# Patient Record
Sex: Female | Born: 1940 | Race: White | Hispanic: No | Marital: Married | Smoking: Former smoker
Health system: Southern US, Community
[De-identification: ages and names within clinical notes are randomized; demographics above are authoritative.]

## PROBLEM LIST (undated history)

## (undated) DIAGNOSIS — I4891 Unspecified atrial fibrillation: Secondary | ICD-10-CM

## (undated) DIAGNOSIS — M1990 Unspecified osteoarthritis, unspecified site: Secondary | ICD-10-CM

## (undated) DIAGNOSIS — K5790 Diverticulosis of intestine, part unspecified, without perforation or abscess without bleeding: Secondary | ICD-10-CM

## (undated) DIAGNOSIS — I2510 Atherosclerotic heart disease of native coronary artery without angina pectoris: Secondary | ICD-10-CM

## (undated) DIAGNOSIS — K635 Polyp of colon: Secondary | ICD-10-CM

## (undated) DIAGNOSIS — IMO0001 Reserved for inherently not codable concepts without codable children: Secondary | ICD-10-CM

## (undated) DIAGNOSIS — R55 Syncope and collapse: Secondary | ICD-10-CM

## (undated) DIAGNOSIS — C50919 Malignant neoplasm of unspecified site of unspecified female breast: Secondary | ICD-10-CM

## (undated) DIAGNOSIS — N390 Urinary tract infection, site not specified: Secondary | ICD-10-CM

## (undated) DIAGNOSIS — E785 Hyperlipidemia, unspecified: Secondary | ICD-10-CM

## (undated) DIAGNOSIS — I059 Rheumatic mitral valve disease, unspecified: Secondary | ICD-10-CM

## (undated) DIAGNOSIS — T4145XA Adverse effect of unspecified anesthetic, initial encounter: Secondary | ICD-10-CM

## (undated) DIAGNOSIS — Z8719 Personal history of other diseases of the digestive system: Secondary | ICD-10-CM

## (undated) DIAGNOSIS — I6529 Occlusion and stenosis of unspecified carotid artery: Secondary | ICD-10-CM

## (undated) DIAGNOSIS — T8859XA Other complications of anesthesia, initial encounter: Secondary | ICD-10-CM

## (undated) DIAGNOSIS — Z9109 Other allergy status, other than to drugs and biological substances: Secondary | ICD-10-CM

## (undated) DIAGNOSIS — Z923 Personal history of irradiation: Secondary | ICD-10-CM

## (undated) DIAGNOSIS — I499 Cardiac arrhythmia, unspecified: Secondary | ICD-10-CM

## (undated) DIAGNOSIS — K219 Gastro-esophageal reflux disease without esophagitis: Secondary | ICD-10-CM

## (undated) DIAGNOSIS — I10 Essential (primary) hypertension: Secondary | ICD-10-CM

## (undated) DIAGNOSIS — J40 Bronchitis, not specified as acute or chronic: Secondary | ICD-10-CM

## (undated) DIAGNOSIS — K589 Irritable bowel syndrome without diarrhea: Secondary | ICD-10-CM

## (undated) HISTORY — DX: Atherosclerotic heart disease of native coronary artery without angina pectoris: I25.10

## (undated) HISTORY — DX: Essential (primary) hypertension: I10

## (undated) HISTORY — DX: Rheumatic mitral valve disease, unspecified: I05.9

## (undated) HISTORY — PX: TOTAL KNEE ARTHROPLASTY: SHX125

## (undated) HISTORY — DX: Unspecified atrial fibrillation: I48.91

## (undated) HISTORY — DX: Bronchitis, not specified as acute or chronic: J40

## (undated) HISTORY — PX: BREAST LUMPECTOMY: SHX2

## (undated) HISTORY — PX: TONSILLECTOMY: SUR1361

## (undated) HISTORY — DX: Occlusion and stenosis of unspecified carotid artery: I65.29

## (undated) HISTORY — DX: Polyp of colon: K63.5

## (undated) HISTORY — DX: Unspecified osteoarthritis, unspecified site: M19.90

## (undated) HISTORY — DX: Syncope and collapse: R55

## (undated) HISTORY — PX: APPENDECTOMY: SHX54

## (undated) HISTORY — DX: Urinary tract infection, site not specified: N39.0

## (undated) HISTORY — PX: BREAST CYST ASPIRATION: SHX578

## (undated) HISTORY — DX: Hyperlipidemia, unspecified: E78.5

---

## 1995-10-04 HISTORY — PX: BREAST EXCISIONAL BIOPSY: SUR124

## 2000-10-03 DIAGNOSIS — C50919 Malignant neoplasm of unspecified site of unspecified female breast: Secondary | ICD-10-CM

## 2000-10-03 HISTORY — PX: BREAST BIOPSY: SHX20

## 2000-10-03 HISTORY — DX: Malignant neoplasm of unspecified site of unspecified female breast: C50.919

## 2014-01-22 ENCOUNTER — Encounter: Payer: Self-pay | Admitting: Internal Medicine

## 2014-10-06 DIAGNOSIS — J019 Acute sinusitis, unspecified: Secondary | ICD-10-CM | POA: Diagnosis not present

## 2014-10-06 DIAGNOSIS — J209 Acute bronchitis, unspecified: Secondary | ICD-10-CM | POA: Diagnosis not present

## 2014-10-13 DIAGNOSIS — E538 Deficiency of other specified B group vitamins: Secondary | ICD-10-CM | POA: Diagnosis not present

## 2014-10-20 DIAGNOSIS — I6529 Occlusion and stenosis of unspecified carotid artery: Secondary | ICD-10-CM | POA: Diagnosis not present

## 2014-10-20 DIAGNOSIS — I6523 Occlusion and stenosis of bilateral carotid arteries: Secondary | ICD-10-CM | POA: Diagnosis not present

## 2014-10-30 DIAGNOSIS — I10 Essential (primary) hypertension: Secondary | ICD-10-CM | POA: Diagnosis not present

## 2014-10-30 DIAGNOSIS — E782 Mixed hyperlipidemia: Secondary | ICD-10-CM | POA: Diagnosis not present

## 2014-10-30 DIAGNOSIS — I6523 Occlusion and stenosis of bilateral carotid arteries: Secondary | ICD-10-CM | POA: Diagnosis not present

## 2014-11-03 DIAGNOSIS — E782 Mixed hyperlipidemia: Secondary | ICD-10-CM | POA: Diagnosis not present

## 2014-11-03 DIAGNOSIS — I6523 Occlusion and stenosis of bilateral carotid arteries: Secondary | ICD-10-CM | POA: Diagnosis not present

## 2014-11-03 DIAGNOSIS — I10 Essential (primary) hypertension: Secondary | ICD-10-CM | POA: Diagnosis not present

## 2014-11-04 DIAGNOSIS — H6980 Other specified disorders of Eustachian tube, unspecified ear: Secondary | ICD-10-CM | POA: Diagnosis not present

## 2014-11-13 DIAGNOSIS — J019 Acute sinusitis, unspecified: Secondary | ICD-10-CM | POA: Diagnosis not present

## 2014-11-13 DIAGNOSIS — H6983 Other specified disorders of Eustachian tube, bilateral: Secondary | ICD-10-CM | POA: Diagnosis not present

## 2014-11-18 DIAGNOSIS — H524 Presbyopia: Secondary | ICD-10-CM | POA: Diagnosis not present

## 2014-11-18 DIAGNOSIS — H2513 Age-related nuclear cataract, bilateral: Secondary | ICD-10-CM | POA: Diagnosis not present

## 2014-11-18 DIAGNOSIS — H527 Unspecified disorder of refraction: Secondary | ICD-10-CM | POA: Diagnosis not present

## 2014-11-18 DIAGNOSIS — H43813 Vitreous degeneration, bilateral: Secondary | ICD-10-CM | POA: Diagnosis not present

## 2014-12-12 DIAGNOSIS — J019 Acute sinusitis, unspecified: Secondary | ICD-10-CM | POA: Diagnosis not present

## 2014-12-12 DIAGNOSIS — H6983 Other specified disorders of Eustachian tube, bilateral: Secondary | ICD-10-CM | POA: Diagnosis not present

## 2014-12-12 DIAGNOSIS — H903 Sensorineural hearing loss, bilateral: Secondary | ICD-10-CM | POA: Diagnosis not present

## 2014-12-12 DIAGNOSIS — Z124 Encounter for screening for malignant neoplasm of cervix: Secondary | ICD-10-CM | POA: Diagnosis not present

## 2014-12-26 DIAGNOSIS — L309 Dermatitis, unspecified: Secondary | ICD-10-CM | POA: Diagnosis not present

## 2014-12-26 DIAGNOSIS — L821 Other seborrheic keratosis: Secondary | ICD-10-CM | POA: Diagnosis not present

## 2015-01-22 DIAGNOSIS — E782 Mixed hyperlipidemia: Secondary | ICD-10-CM | POA: Diagnosis not present

## 2015-01-22 DIAGNOSIS — I10 Essential (primary) hypertension: Secondary | ICD-10-CM | POA: Diagnosis not present

## 2015-01-22 DIAGNOSIS — I6529 Occlusion and stenosis of unspecified carotid artery: Secondary | ICD-10-CM | POA: Diagnosis not present

## 2015-02-13 DIAGNOSIS — M25572 Pain in left ankle and joints of left foot: Secondary | ICD-10-CM | POA: Diagnosis not present

## 2015-03-10 DIAGNOSIS — E780 Pure hypercholesterolemia: Secondary | ICD-10-CM | POA: Diagnosis not present

## 2015-03-10 DIAGNOSIS — Z0000 Encounter for general adult medical examination without abnormal findings: Secondary | ICD-10-CM | POA: Diagnosis not present

## 2015-03-10 DIAGNOSIS — E559 Vitamin D deficiency, unspecified: Secondary | ICD-10-CM | POA: Diagnosis not present

## 2015-03-10 DIAGNOSIS — I10 Essential (primary) hypertension: Secondary | ICD-10-CM | POA: Diagnosis not present

## 2015-03-10 DIAGNOSIS — C50919 Malignant neoplasm of unspecified site of unspecified female breast: Secondary | ICD-10-CM | POA: Diagnosis not present

## 2015-03-18 DIAGNOSIS — I10 Essential (primary) hypertension: Secondary | ICD-10-CM | POA: Diagnosis not present

## 2015-03-18 DIAGNOSIS — D696 Thrombocytopenia, unspecified: Secondary | ICD-10-CM | POA: Diagnosis not present

## 2015-03-18 DIAGNOSIS — R350 Frequency of micturition: Secondary | ICD-10-CM | POA: Diagnosis not present

## 2015-03-18 DIAGNOSIS — M8580 Other specified disorders of bone density and structure, unspecified site: Secondary | ICD-10-CM | POA: Diagnosis not present

## 2015-03-18 DIAGNOSIS — G629 Polyneuropathy, unspecified: Secondary | ICD-10-CM | POA: Diagnosis not present

## 2015-03-18 DIAGNOSIS — E780 Pure hypercholesterolemia: Secondary | ICD-10-CM | POA: Diagnosis not present

## 2015-03-20 DIAGNOSIS — M85852 Other specified disorders of bone density and structure, left thigh: Secondary | ICD-10-CM | POA: Diagnosis not present

## 2015-03-20 DIAGNOSIS — M859 Disorder of bone density and structure, unspecified: Secondary | ICD-10-CM | POA: Diagnosis not present

## 2015-04-23 DIAGNOSIS — B029 Zoster without complications: Secondary | ICD-10-CM | POA: Diagnosis not present

## 2015-04-30 DIAGNOSIS — Z853 Personal history of malignant neoplasm of breast: Secondary | ICD-10-CM | POA: Diagnosis not present

## 2015-04-30 DIAGNOSIS — Z780 Asymptomatic menopausal state: Secondary | ICD-10-CM | POA: Diagnosis not present

## 2015-04-30 DIAGNOSIS — Z79810 Long term (current) use of selective estrogen receptor modulators (SERMs): Secondary | ICD-10-CM | POA: Diagnosis not present

## 2015-04-30 DIAGNOSIS — Z1231 Encounter for screening mammogram for malignant neoplasm of breast: Secondary | ICD-10-CM | POA: Diagnosis not present

## 2015-04-30 DIAGNOSIS — Z9889 Other specified postprocedural states: Secondary | ICD-10-CM | POA: Diagnosis not present

## 2015-06-11 DIAGNOSIS — F419 Anxiety disorder, unspecified: Secondary | ICD-10-CM | POA: Diagnosis not present

## 2015-06-11 DIAGNOSIS — R200 Anesthesia of skin: Secondary | ICD-10-CM | POA: Diagnosis not present

## 2015-06-11 DIAGNOSIS — I10 Essential (primary) hypertension: Secondary | ICD-10-CM | POA: Diagnosis not present

## 2015-06-29 DIAGNOSIS — M150 Primary generalized (osteo)arthritis: Secondary | ICD-10-CM | POA: Diagnosis not present

## 2015-06-29 DIAGNOSIS — Z79899 Other long term (current) drug therapy: Secondary | ICD-10-CM | POA: Diagnosis not present

## 2015-06-29 DIAGNOSIS — M19041 Primary osteoarthritis, right hand: Secondary | ICD-10-CM | POA: Diagnosis not present

## 2015-07-22 DIAGNOSIS — Z23 Encounter for immunization: Secondary | ICD-10-CM | POA: Diagnosis not present

## 2015-08-06 DIAGNOSIS — Z008 Encounter for other general examination: Secondary | ICD-10-CM | POA: Diagnosis not present

## 2015-08-06 DIAGNOSIS — I10 Essential (primary) hypertension: Secondary | ICD-10-CM | POA: Diagnosis not present

## 2015-08-06 DIAGNOSIS — R5383 Other fatigue: Secondary | ICD-10-CM | POA: Diagnosis not present

## 2015-09-07 DIAGNOSIS — R760 Raised antibody titer: Secondary | ICD-10-CM | POA: Diagnosis not present

## 2015-09-07 DIAGNOSIS — M19041 Primary osteoarthritis, right hand: Secondary | ICD-10-CM | POA: Diagnosis not present

## 2015-09-07 DIAGNOSIS — M150 Primary generalized (osteo)arthritis: Secondary | ICD-10-CM | POA: Diagnosis not present

## 2015-10-09 DIAGNOSIS — H6123 Impacted cerumen, bilateral: Secondary | ICD-10-CM | POA: Diagnosis not present

## 2015-10-13 DIAGNOSIS — Z111 Encounter for screening for respiratory tuberculosis: Secondary | ICD-10-CM | POA: Diagnosis not present

## 2015-10-13 DIAGNOSIS — I10 Essential (primary) hypertension: Secondary | ICD-10-CM | POA: Diagnosis not present

## 2015-10-13 DIAGNOSIS — Z23 Encounter for immunization: Secondary | ICD-10-CM | POA: Diagnosis not present

## 2015-10-23 DIAGNOSIS — E782 Mixed hyperlipidemia: Secondary | ICD-10-CM | POA: Diagnosis not present

## 2015-10-23 DIAGNOSIS — I6523 Occlusion and stenosis of bilateral carotid arteries: Secondary | ICD-10-CM | POA: Diagnosis not present

## 2015-10-23 DIAGNOSIS — I270 Primary pulmonary hypertension: Secondary | ICD-10-CM | POA: Diagnosis not present

## 2015-10-23 DIAGNOSIS — I10 Essential (primary) hypertension: Secondary | ICD-10-CM | POA: Diagnosis not present

## 2015-10-23 DIAGNOSIS — I700 Atherosclerosis of aorta: Secondary | ICD-10-CM | POA: Diagnosis not present

## 2015-10-23 DIAGNOSIS — I361 Nonrheumatic tricuspid (valve) insufficiency: Secondary | ICD-10-CM | POA: Diagnosis not present

## 2015-10-23 DIAGNOSIS — I071 Rheumatic tricuspid insufficiency: Secondary | ICD-10-CM | POA: Diagnosis not present

## 2015-10-23 DIAGNOSIS — I340 Nonrheumatic mitral (valve) insufficiency: Secondary | ICD-10-CM | POA: Diagnosis not present

## 2015-11-05 DIAGNOSIS — A084 Viral intestinal infection, unspecified: Secondary | ICD-10-CM | POA: Diagnosis not present

## 2015-11-12 DIAGNOSIS — H109 Unspecified conjunctivitis: Secondary | ICD-10-CM | POA: Diagnosis not present

## 2015-11-13 DIAGNOSIS — E7800 Pure hypercholesterolemia, unspecified: Secondary | ICD-10-CM | POA: Diagnosis not present

## 2015-11-13 DIAGNOSIS — R0602 Shortness of breath: Secondary | ICD-10-CM | POA: Diagnosis not present

## 2015-11-13 DIAGNOSIS — I361 Nonrheumatic tricuspid (valve) insufficiency: Secondary | ICD-10-CM | POA: Diagnosis not present

## 2015-11-13 DIAGNOSIS — I6521 Occlusion and stenosis of right carotid artery: Secondary | ICD-10-CM | POA: Diagnosis not present

## 2015-11-13 DIAGNOSIS — I10 Essential (primary) hypertension: Secondary | ICD-10-CM | POA: Diagnosis not present

## 2015-11-13 DIAGNOSIS — R5383 Other fatigue: Secondary | ICD-10-CM | POA: Diagnosis not present

## 2015-11-16 DIAGNOSIS — Z23 Encounter for immunization: Secondary | ICD-10-CM | POA: Diagnosis not present

## 2015-11-18 DIAGNOSIS — I6529 Occlusion and stenosis of unspecified carotid artery: Secondary | ICD-10-CM | POA: Diagnosis not present

## 2015-11-18 DIAGNOSIS — I6521 Occlusion and stenosis of right carotid artery: Secondary | ICD-10-CM | POA: Diagnosis not present

## 2015-11-19 DIAGNOSIS — R0602 Shortness of breath: Secondary | ICD-10-CM | POA: Diagnosis not present

## 2015-11-19 DIAGNOSIS — R5383 Other fatigue: Secondary | ICD-10-CM | POA: Diagnosis not present

## 2015-11-19 DIAGNOSIS — E782 Mixed hyperlipidemia: Secondary | ICD-10-CM | POA: Diagnosis not present

## 2015-11-19 DIAGNOSIS — I10 Essential (primary) hypertension: Secondary | ICD-10-CM | POA: Diagnosis not present

## 2015-11-19 DIAGNOSIS — I6523 Occlusion and stenosis of bilateral carotid arteries: Secondary | ICD-10-CM | POA: Diagnosis not present

## 2015-11-25 DIAGNOSIS — K219 Gastro-esophageal reflux disease without esophagitis: Secondary | ICD-10-CM | POA: Diagnosis not present

## 2015-11-25 DIAGNOSIS — R070 Pain in throat: Secondary | ICD-10-CM | POA: Diagnosis not present

## 2015-12-01 DIAGNOSIS — I700 Atherosclerosis of aorta: Secondary | ICD-10-CM | POA: Diagnosis not present

## 2015-12-01 DIAGNOSIS — R1310 Dysphagia, unspecified: Secondary | ICD-10-CM | POA: Diagnosis not present

## 2015-12-01 DIAGNOSIS — J209 Acute bronchitis, unspecified: Secondary | ICD-10-CM | POA: Diagnosis not present

## 2015-12-01 DIAGNOSIS — J019 Acute sinusitis, unspecified: Secondary | ICD-10-CM | POA: Diagnosis not present

## 2015-12-01 DIAGNOSIS — K52832 Lymphocytic colitis: Secondary | ICD-10-CM | POA: Diagnosis not present

## 2015-12-01 DIAGNOSIS — R197 Diarrhea, unspecified: Secondary | ICD-10-CM | POA: Diagnosis not present

## 2015-12-08 DIAGNOSIS — R1310 Dysphagia, unspecified: Secondary | ICD-10-CM | POA: Diagnosis not present

## 2015-12-08 DIAGNOSIS — R197 Diarrhea, unspecified: Secondary | ICD-10-CM | POA: Diagnosis not present

## 2015-12-08 DIAGNOSIS — Z853 Personal history of malignant neoplasm of breast: Secondary | ICD-10-CM | POA: Diagnosis not present

## 2015-12-08 DIAGNOSIS — Z79899 Other long term (current) drug therapy: Secondary | ICD-10-CM | POA: Diagnosis not present

## 2015-12-08 DIAGNOSIS — K449 Diaphragmatic hernia without obstruction or gangrene: Secondary | ICD-10-CM | POA: Diagnosis not present

## 2015-12-08 DIAGNOSIS — Z885 Allergy status to narcotic agent status: Secondary | ICD-10-CM | POA: Diagnosis not present

## 2015-12-08 DIAGNOSIS — G894 Chronic pain syndrome: Secondary | ICD-10-CM | POA: Diagnosis not present

## 2015-12-08 DIAGNOSIS — K317 Polyp of stomach and duodenum: Secondary | ICD-10-CM | POA: Diagnosis not present

## 2015-12-08 DIAGNOSIS — K648 Other hemorrhoids: Secondary | ICD-10-CM | POA: Diagnosis not present

## 2015-12-08 DIAGNOSIS — Z91040 Latex allergy status: Secondary | ICD-10-CM | POA: Diagnosis not present

## 2015-12-08 DIAGNOSIS — M810 Age-related osteoporosis without current pathological fracture: Secondary | ICD-10-CM | POA: Diagnosis not present

## 2015-12-08 DIAGNOSIS — Z888 Allergy status to other drugs, medicaments and biological substances status: Secondary | ICD-10-CM | POA: Diagnosis not present

## 2015-12-08 DIAGNOSIS — Z880 Allergy status to penicillin: Secondary | ICD-10-CM | POA: Diagnosis not present

## 2015-12-08 DIAGNOSIS — I10 Essential (primary) hypertension: Secondary | ICD-10-CM | POA: Diagnosis not present

## 2015-12-10 DIAGNOSIS — J209 Acute bronchitis, unspecified: Secondary | ICD-10-CM | POA: Diagnosis not present

## 2015-12-23 DIAGNOSIS — K449 Diaphragmatic hernia without obstruction or gangrene: Secondary | ICD-10-CM | POA: Diagnosis not present

## 2015-12-23 DIAGNOSIS — K2950 Unspecified chronic gastritis without bleeding: Secondary | ICD-10-CM | POA: Diagnosis not present

## 2015-12-23 DIAGNOSIS — R1310 Dysphagia, unspecified: Secondary | ICD-10-CM | POA: Diagnosis not present

## 2015-12-23 DIAGNOSIS — R197 Diarrhea, unspecified: Secondary | ICD-10-CM | POA: Diagnosis not present

## 2015-12-23 DIAGNOSIS — K222 Esophageal obstruction: Secondary | ICD-10-CM | POA: Diagnosis not present

## 2015-12-23 DIAGNOSIS — K317 Polyp of stomach and duodenum: Secondary | ICD-10-CM | POA: Diagnosis not present

## 2015-12-23 DIAGNOSIS — G894 Chronic pain syndrome: Secondary | ICD-10-CM | POA: Diagnosis not present

## 2015-12-23 DIAGNOSIS — K648 Other hemorrhoids: Secondary | ICD-10-CM | POA: Diagnosis not present

## 2015-12-25 DIAGNOSIS — R0602 Shortness of breath: Secondary | ICD-10-CM | POA: Diagnosis not present

## 2015-12-25 DIAGNOSIS — E7800 Pure hypercholesterolemia, unspecified: Secondary | ICD-10-CM | POA: Diagnosis not present

## 2015-12-25 DIAGNOSIS — I361 Nonrheumatic tricuspid (valve) insufficiency: Secondary | ICD-10-CM | POA: Diagnosis not present

## 2015-12-25 DIAGNOSIS — R5383 Other fatigue: Secondary | ICD-10-CM | POA: Diagnosis not present

## 2015-12-25 DIAGNOSIS — I6521 Occlusion and stenosis of right carotid artery: Secondary | ICD-10-CM | POA: Diagnosis not present

## 2015-12-25 DIAGNOSIS — I10 Essential (primary) hypertension: Secondary | ICD-10-CM | POA: Diagnosis not present

## 2016-01-19 ENCOUNTER — Ambulatory Visit: Payer: Self-pay | Admitting: Cardiology

## 2016-02-04 ENCOUNTER — Ambulatory Visit: Payer: Self-pay | Admitting: Cardiology

## 2016-02-05 ENCOUNTER — Ambulatory Visit: Payer: Self-pay | Admitting: Cardiovascular Disease

## 2016-02-09 ENCOUNTER — Encounter: Payer: Self-pay | Admitting: Family Medicine

## 2016-02-09 ENCOUNTER — Ambulatory Visit (INDEPENDENT_AMBULATORY_CARE_PROVIDER_SITE_OTHER): Payer: Medicare Other | Admitting: Family Medicine

## 2016-02-09 VITALS — BP 118/68 | HR 65 | Temp 98.3°F | Ht 63.5 in | Wt 151.4 lb

## 2016-02-09 DIAGNOSIS — J309 Allergic rhinitis, unspecified: Secondary | ICD-10-CM | POA: Diagnosis not present

## 2016-02-09 MED ORDER — FLUTICASONE PROPIONATE 50 MCG/ACT NA SUSP: 2.0000 | Freq: Every day | NASAL | Status: DC

## 2016-02-09 MED ORDER — LORATADINE 10 MG PO TABS: 10.0000 mg | ORAL_TABLET | Freq: Every day | ORAL | Status: DC

## 2016-02-09 MED ORDER — BENZONATATE 200 MG PO CAPS: 200.0000 mg | ORAL_CAPSULE | Freq: Two times a day (BID) | ORAL | Status: DC | PRN

## 2016-02-09 NOTE — Progress Notes (Signed)
Patient ID: Isabel Hernandez, female   DOB: 11/07/1940, 75 y.o.   MRN: [id number]  Tommi Rumps, MD Phone: [phone]  Isabel Hernandez is a 75 y.o. female who presents today for same-day visit and new patient visit.  Patient notes onset of cough, congestion, rhinorrhea, itchy watery eyes, postnasal drip, and mild headache on Sunday. No numbness, weakness, or vision changes. The headache was dull in nature. The headache was gradual in onset. Notes the headache has gone away. She notes no fevers or ear discomfort. No shortness of breath. Mild productive cough. Has a history of allergic rhinitis though does not take medications persistently. No sick contacts noted. Does note a history of bronchitis in the past.  Active Ambulatory Problems    Diagnosis Date Noted  . Allergic rhinitis 02/09/2016   Resolved Ambulatory Problems    Diagnosis Date Noted  . No Resolved Ambulatory Problems   Past Medical History  Diagnosis Date  . Arthritis   . Cancer ([place])   . Bronchitis   . Hypertension   . Hyperlipidemia   . Colon polyps   . UTI (lower urinary tract infection)     Family History  Problem Relation Age of Onset  . Lung cancer Father   . Lung cancer Sister   . Prostate cancer Father   . Hyperlipidemia Brother   . Heart disease Mother   . Hypertension Brother   . Kidney disease      Maternal grandparent  . Diabetes Brother     Social History   Social History  . Marital Status: Married    Spouse Name: Isabel Hernandez  . Number of Children: Isabel Hernandez  . Years of Education: Isabel Hernandez   Occupational History  . Not on file.   Social History Main Topics  . Smoking status: Former Research scientist (life sciences)  . Smokeless tobacco: Not on file  . Alcohol Use: 0.0 oz/week    0 Standard drinks or equivalent per week  . Drug Use: No  . Sexual Activity: Not on file   Other Topics Concern  . Not on file   Social History Narrative  . No narrative on file    ROS See history of present illness  Objective  Physical Exam Filed  Vitals:   02/09/16 1304  BP: 118/68  Pulse: 65  Temp: 98.3 F (36.8 C)    BP Readings from Last 3 Encounters:  02/09/16 118/68   Wt Readings from Last 3 Encounters:  02/09/16 151 lb 6.4 oz (68.675 kg)    Physical Exam  Constitutional: She is well-developed, well-nourished, and in no distress.  HENT:  Head: Normocephalic and atraumatic.  Right Ear: External ear normal.  Left Ear: External ear normal.  Mouth/Throat: Oropharynx is clear and moist. No oropharyngeal exudate.  Eyes: Conjunctivae are normal. Pupils are equal, round, and reactive to light.  Neck: Neck supple.  Cardiovascular: Normal rate, regular rhythm and normal heart sounds.   Pulmonary/Chest: Effort normal and breath sounds normal.  Neurological: She is alert. Gait normal.  Skin: Skin is warm and dry. She is not diaphoretic.     Assessment/Plan:   Allergic rhinitis Symptoms most consistent with allergic rhinitis. Possibly could be related to a viral illness. Unlikely bacterial. We'll treat with Tessalon for cough. Flonase and Claritin for allergic component. She'll continue to monitor. Given return precautions.    Tommi Rumps, MD [place]

## 2016-02-09 NOTE — Patient Instructions (Signed)
Nice to meet you. Your symptoms are likely related to allergies or viral illness. We will treat this with Flonase and Claritin. You can take the Tessalon for cough. If you develop fevers, shortness of breath, cough productive of blood, numbness, weakness, headaches, or any new or changing symptoms please seek medical attention.

## 2016-02-09 NOTE — Progress Notes (Signed)
Pre visit review using our clinic review tool, if applicable. No additional management support is needed unless otherwise documented below in the visit note. 

## 2016-02-09 NOTE — Assessment & Plan Note (Signed)
Symptoms most consistent with allergic rhinitis. Possibly could be related to a viral illness. Unlikely bacterial. We'll treat with Tessalon for cough. Flonase and Claritin for allergic component. She'll continue to monitor. Given return precautions.

## 2016-02-17 ENCOUNTER — Encounter: Payer: Self-pay | Admitting: Family Medicine

## 2016-02-17 ENCOUNTER — Ambulatory Visit (INDEPENDENT_AMBULATORY_CARE_PROVIDER_SITE_OTHER): Payer: Medicare Other | Admitting: Family Medicine

## 2016-02-17 VITALS — BP 126/64 | HR 70 | Temp 98.3°F | Ht 63.5 in | Wt 152.2 lb

## 2016-02-17 DIAGNOSIS — E785 Hyperlipidemia, unspecified: Secondary | ICD-10-CM

## 2016-02-17 DIAGNOSIS — R938 Abnormal findings on diagnostic imaging of other specified body structures: Secondary | ICD-10-CM

## 2016-02-17 DIAGNOSIS — I10 Essential (primary) hypertension: Secondary | ICD-10-CM | POA: Diagnosis not present

## 2016-02-17 DIAGNOSIS — I6523 Occlusion and stenosis of bilateral carotid arteries: Secondary | ICD-10-CM | POA: Diagnosis not present

## 2016-02-17 DIAGNOSIS — I6529 Occlusion and stenosis of unspecified carotid artery: Secondary | ICD-10-CM | POA: Insufficient documentation

## 2016-02-17 DIAGNOSIS — J8410 Pulmonary fibrosis, unspecified: Secondary | ICD-10-CM

## 2016-02-17 DIAGNOSIS — J984 Other disorders of lung: Secondary | ICD-10-CM

## 2016-02-17 DIAGNOSIS — R9389 Abnormal findings on diagnostic imaging of other specified body structures: Secondary | ICD-10-CM | POA: Insufficient documentation

## 2016-02-17 DIAGNOSIS — R197 Diarrhea, unspecified: Secondary | ICD-10-CM | POA: Insufficient documentation

## 2016-02-17 MED ORDER — AMLODIPINE BESYLATE 5 MG PO TABS: 5.0000 mg | ORAL_TABLET | Freq: Every day | ORAL | Status: DC

## 2016-02-17 NOTE — Progress Notes (Signed)
Pre visit review using our clinic review tool, if applicable. No additional management support is needed unless otherwise documented below in the visit note. 

## 2016-02-17 NOTE — Assessment & Plan Note (Signed)
Chronic intermittent issue. Unknown cause. Under care of GI for this. Benign abdominal exam. She will keep follow-up with GI next week. Return precautions.

## 2016-02-17 NOTE — Progress Notes (Signed)
Patient ID: Isabel Hernandez, female   DOB: 09-Jul-1941, 75 y.o.   MRN: [id number]  Tommi Rumps, MD Phone: [phone]  Isabel Hernandez is a 75 y.o. female who presents today for f/u establish care visit.  Patient notes chronic intermittent diarrhea for 6-7 years. She had a colonoscopy in March did have some polyps though reports no other abnormalities. Notes some discomfort with her hemorrhoids when she has significant diarrhea though no abdominal pain. Notes it typically occurs for 5 weeks at a time and she has not been able to identify any causes. She has follow-up with GI next week for further workup of this issue. She does stay well hydrated when having diarrhea.  Abnormal chest x-ray: Patient notes that her prior PCP obtained a chest x-ray to evaluate given her family history of lung cancer. There is granulomatous disease noted. Possible decreased filling of the left lung. No shortness of breath. She smoked for 10 years and quit in 1971. Father had lung cancer. Sister had lung cancer.  Carotid stenosis: Patient reports 50% blockage and also having plaques in her aorta. Has been followed by cardiology for this. Takes aspirin 81 mg daily. Is on Lipitor and TriCor. Has an appointment with cardiology in June.  HYPERTENSION Disease Monitoring Home BP Monitoring not checking Chest pain- no    Dyspnea- no Medications Compliance-  taking amlodipine, hydrochlorothiazide, losartan, and metoprolol. Lightheadedness-  no  Edema- no  HYPERLIPIDEMIA Symptoms Chest pain on exertion:  No   Leg claudication:   No Medications: Compliance- taking Lipitor and TriCor Right upper quadrant pain- no  Muscle aches- no   PMH: former smoker   ROS see history of present illness  Objective  Physical Exam Filed Vitals:   02/17/16 1328  BP: 126/64  Pulse: 70  Temp: 98.3 F (36.8 C)    BP Readings from Last 3 Encounters:  02/17/16 126/64  02/09/16 118/68   Wt Readings from Last 3 Encounters:  02/17/16  152 lb 3.2 oz (69.037 kg)  02/09/16 151 lb 6.4 oz (68.675 kg)    Physical Exam  Constitutional: She is well-developed, well-nourished, and in no distress.  HENT:  Head: Normocephalic and atraumatic.  Right Ear: External ear normal.  Left Ear: External ear normal.  Cardiovascular: Normal rate, regular rhythm and normal heart sounds.   Pulmonary/Chest: Effort normal and breath sounds normal.  Abdominal: Soft. Bowel sounds are normal. She exhibits no distension. There is no tenderness. There is no rebound and no guarding.  Musculoskeletal: She exhibits no edema.  Neurological: She is alert. Gait normal.  Skin: Skin is warm and dry. She is not diaphoretic.     Assessment/Plan: Please see individual problem list.  Essential hypertension At goal today. Refilled amlodipine. Continue other medications.  Carotid stenosis Reports 50% blockage on last imaging study. Takes an aspirin and statin. Follow up with cardiology is already scheduled for June. Discussed precautions to seek medical attention regarding stroke and heart attack.  Abnormal chest x-ray Findings of granulomatous disease. Prior PCP advised pulmonary referral when she moved here. Does have a significant family history of lung cancer in her father and sister. Pulmonology referral placed.  Diarrhea Chronic intermittent issue. Unknown cause. Under care of GI for this. Benign abdominal exam. She will keep follow-up with GI next week. Return precautions.  Hyperlipidemia Tolerating medications. We'll request records from her prior PCP to determine next time to check her labs.    Orders Placed This Encounter  Procedures  . Ambulatory referral to Pulmonology  Referral Priority:  Routine    Referral Type:  Consultation    Referral Reason:  Specialty Services Required    Requested Specialty:  Pulmonary Disease    Number of Visits Requested:  1    Meds ordered this encounter  Medications  . amLODipine (NORVASC) 5 MG  tablet    Sig: Take 1 tablet (5 mg total) by mouth daily.    Dispense:  90 tablet    Refill:  1    Tommi Rumps, MD [place]

## 2016-02-17 NOTE — Assessment & Plan Note (Signed)
At goal today. Refilled amlodipine. Continue other medications.

## 2016-02-17 NOTE — Patient Instructions (Signed)
Nice to see you. Please keep your follow-up with GI next week. We will refer you to pulmonology for evaluation of lung disease and family history of lung cancer. Please keep your appointment with cardiology. We will await your records to determine when her next lab work is due. If you chest pain, shortness breath, numbness, weakness, change in speech, facial droop, abdominal pain, blood in her stool, or any new or changing symptoms please seek medical attention.

## 2016-02-17 NOTE — Assessment & Plan Note (Signed)
Findings of granulomatous disease. Prior PCP advised pulmonary referral when she moved here. Does have a significant family history of lung cancer in her father and sister. Pulmonology referral placed.

## 2016-02-17 NOTE — Assessment & Plan Note (Signed)
Reports 50% blockage on last imaging study. Takes an aspirin and statin. Follow up with cardiology is already scheduled for June. Discussed precautions to seek medical attention regarding stroke and heart attack.

## 2016-02-17 NOTE — Assessment & Plan Note (Signed)
Tolerating medications. We'll request records from her prior PCP to determine next time to check her labs.

## 2016-02-24 DIAGNOSIS — R197 Diarrhea, unspecified: Secondary | ICD-10-CM | POA: Diagnosis not present

## 2016-02-24 DIAGNOSIS — R1310 Dysphagia, unspecified: Secondary | ICD-10-CM | POA: Diagnosis not present

## 2016-02-24 DIAGNOSIS — R159 Full incontinence of feces: Secondary | ICD-10-CM | POA: Diagnosis not present

## 2016-03-07 ENCOUNTER — Emergency Department
Admission: EM | Admit: 2016-03-07 | Discharge: 2016-03-07 | Disposition: A | Discharge status: Home / Self Care | Payer: Medicare Other | Attending: Emergency Medicine | Admitting: Emergency Medicine

## 2016-03-07 DIAGNOSIS — Z859 Personal history of malignant neoplasm, unspecified: Secondary | ICD-10-CM | POA: Diagnosis not present

## 2016-03-07 DIAGNOSIS — Y999 Unspecified external cause status: Secondary | ICD-10-CM | POA: Diagnosis not present

## 2016-03-07 DIAGNOSIS — Y92009 Unspecified place in unspecified non-institutional (private) residence as the place of occurrence of the external cause: Secondary | ICD-10-CM | POA: Diagnosis not present

## 2016-03-07 DIAGNOSIS — I059 Rheumatic mitral valve disease, unspecified: Secondary | ICD-10-CM | POA: Diagnosis not present

## 2016-03-07 DIAGNOSIS — S61210A Laceration without foreign body of right index finger without damage to nail, initial encounter: Secondary | ICD-10-CM | POA: Insufficient documentation

## 2016-03-07 DIAGNOSIS — Z79899 Other long term (current) drug therapy: Secondary | ICD-10-CM | POA: Insufficient documentation

## 2016-03-07 DIAGNOSIS — W231XXA Caught, crushed, jammed, or pinched between stationary objects, initial encounter: Secondary | ICD-10-CM | POA: Insufficient documentation

## 2016-03-07 DIAGNOSIS — S61219A Laceration without foreign body of unspecified finger without damage to nail, initial encounter: Secondary | ICD-10-CM

## 2016-03-07 DIAGNOSIS — I4891 Unspecified atrial fibrillation: Secondary | ICD-10-CM | POA: Insufficient documentation

## 2016-03-07 DIAGNOSIS — Y9389 Activity, other specified: Secondary | ICD-10-CM | POA: Diagnosis not present

## 2016-03-07 DIAGNOSIS — I10 Essential (primary) hypertension: Secondary | ICD-10-CM | POA: Insufficient documentation

## 2016-03-07 DIAGNOSIS — E785 Hyperlipidemia, unspecified: Secondary | ICD-10-CM | POA: Insufficient documentation

## 2016-03-07 DIAGNOSIS — Z87891 Personal history of nicotine dependence: Secondary | ICD-10-CM | POA: Diagnosis not present

## 2016-03-07 DIAGNOSIS — Z23 Encounter for immunization: Secondary | ICD-10-CM | POA: Diagnosis not present

## 2016-03-07 DIAGNOSIS — M1990 Unspecified osteoarthritis, unspecified site: Secondary | ICD-10-CM | POA: Diagnosis not present

## 2016-03-07 DIAGNOSIS — I6529 Occlusion and stenosis of unspecified carotid artery: Secondary | ICD-10-CM | POA: Diagnosis not present

## 2016-03-07 DIAGNOSIS — I2510 Atherosclerotic heart disease of native coronary artery without angina pectoris: Secondary | ICD-10-CM | POA: Diagnosis not present

## 2016-03-07 MED ORDER — LIDOCAINE HCL (PF) 1 % IJ SOLN
INTRAMUSCULAR | Status: AC
  Administered 2016-03-07: 5 mL
  Filled 2016-03-07: qty 5

## 2016-03-07 MED ORDER — TRAMADOL HCL 50 MG PO TABS: 50.0000 mg | ORAL_TABLET | Freq: Two times a day (BID) | ORAL | Status: DC | PRN

## 2016-03-07 MED ORDER — TETANUS-DIPHTH-ACELL PERTUSSIS 5-2.5-18.5 LF-MCG/0.5 IM SUSP
0.5000 mL | Freq: Once | INTRAMUSCULAR | Status: AC
  Administered 2016-03-07: 0.5 mL via INTRAMUSCULAR
  Filled 2016-03-07: qty 0.5

## 2016-03-07 MED ORDER — TRAMADOL HCL 50 MG PO TABS
50.0000 mg | ORAL_TABLET | Freq: Once | ORAL | Status: AC
Start: 2016-03-07 — End: 2016-03-07
  Administered 2016-03-07: 50 mg via ORAL
  Filled 2016-03-07: qty 1

## 2016-03-07 NOTE — ED Provider Notes (Signed)
[facility] Emergency Department Provider Note   ____________________________________________  Time seen: Approximately 8:47 PM  I have reviewed the triage vital signs and the nursing notes.   HISTORY  Chief Complaint Laceration    HPI Isabel Hernandez is a 75 y.o. female patient is a laceration to the distal right index finger. Patient finger was caught in a screen door to close appropriate. Patient denies any loss sensation or loss of function of the finger. Patient state her tetanus shot is not up-to-date. She rates the pain discomfort as a 3/10. No palliative measures taken for this complaint.   Past Medical History  Diagnosis Date  . Arthritis   . Cancer ([place])   . Bronchitis   . Hypertension   . Hyperlipidemia   . Colon polyps   . UTI (lower urinary tract infection)     Patient Active Problem List   Diagnosis Date Noted  . Essential hypertension 02/17/2016  . Carotid stenosis 02/17/2016  . Abnormal chest x-ray 02/17/2016  . Diarrhea 02/17/2016  . Hyperlipidemia 02/17/2016  . Allergic rhinitis 02/09/2016    Past Surgical History  Procedure Laterality Date  . Breast biopsy  1997, 2002  . Appendectomy    . Tonsillectomy    . Total knee arthroplasty Left   . Cesarean section      Current Outpatient Rx  Name  Route  Sig  Dispense  Refill  . amLODipine (NORVASC) 5 MG tablet   Oral   Take 1 tablet (5 mg total) by mouth daily.   90 tablet   1   . atorvastatin (LIPITOR) 20 MG tablet               . benzonatate (TESSALON) 200 MG capsule   Oral   Take 1 capsule (200 mg total) by mouth 2 (two) times daily as needed for cough.   20 capsule   0   . fenofibrate (TRICOR) 145 MG tablet               . fluticasone (FLONASE) 50 MCG/ACT nasal spray   Each Nare   Place 2 sprays into both nostrils daily.   16 g   6   . hydrochlorothiazide (MICROZIDE) 12.5 MG capsule               . loratadine (CLARITIN) 10 MG tablet    Oral   Take 1 tablet (10 mg total) by mouth daily.   30 tablet   11   . losartan (COZAAR) 50 MG tablet               . meloxicam (MOBIC) 7.5 MG tablet               . metoprolol tartrate (LOPRESSOR) 25 MG tablet               . pantoprazole (PROTONIX) 40 MG tablet               . ranitidine (ZANTAC) 150 MG tablet               . traMADol (ULTRAM) 50 MG tablet   Oral   Take 1 tablet (50 mg total) by mouth every 12 (twelve) hours as needed for moderate pain.   10 tablet   0     Allergies Biaxin; Latex; Nitroglycerin; Oxycodone; Penicillins; Tetracyclines & related; and Tizanidine  Family History  Problem Relation Age of Onset  . Lung cancer Father   . Lung cancer Sister   .  Prostate cancer Father   . Hyperlipidemia Brother   . Heart disease Mother   . Hypertension Brother   . Kidney disease      Maternal grandparent  . Diabetes Brother     Social History Social History  Substance Use Topics  . Smoking status: Former Research scientist (life sciences)  . Smokeless tobacco: Not on file  . Alcohol Use: 0.0 oz/week    0 Standard drinks or equivalent per week    Review of Systems Constitutional: No fever/chills Eyes: No visual changes. ENT: No sore throat. Cardiovascular: Denies chest pain. Respiratory: Denies shortness of breath. Gastrointestinal: No abdominal pain.  No nausea, no vomiting.  No diarrhea.  No constipation. Genitourinary: Negative for dysuria. Musculoskeletal: Negative for back pain. Skin: Negative for rash. Neurological: Negative for headaches, focal weakness or numbness. Endocrine:Hypertension and hyperlipidemia. Allergic/Immunilogical: See medication list ____________________________________________   PHYSICAL EXAM:  VITAL SIGNS: ED Triage Vitals  Enc Vitals Group     BP 03/07/16 2034 116/68 mmHg     Pulse Rate 03/07/16 2034 74     Resp 03/07/16 2034 20     Temp 03/07/16 2034 98 F (36.7 C)     Temp Source 03/07/16 2034 Oral     SpO2  03/07/16 2034 96 %     Weight 03/07/16 2034 150 lb (68.04 kg)     Height 03/07/16 2034 5\' 4"  (1.626 m)     Head Cir --      Peak Flow --      Pain Score --      Pain Loc --      Pain Edu? --      Excl. in [place]? --     Constitutional: Alert and oriented. Well appearing and in no acute distress. Eyes: Conjunctivae are normal. PERRL. EOMI. Head: Atraumatic. Nose: No congestion/rhinnorhea. Mouth/Throat: Mucous membranes are moist.  Oropharynx non-erythematous. Neck: No stridor.  No cervical spine tenderness to palpation. Cardiovascular: Normal rate, regular rhythm. Grossly normal heart sounds.  Good peripheral circulation. Respiratory: Normal respiratory effort.  No retractions. Lungs CTAB. Gastrointestinal: Soft and nontender. No distention. No abdominal bruits. No CVA tenderness. Musculoskeletal: No lower extremity tenderness nor edema.  No joint effusions. Neurologic:  Normal speech and language. No gross focal neurologic deficits are appreciated. No gait instability. Skin:  Skin is warm, dry and intact. No rash noted.Laceration palmar aspect of the distal second digit right hand. Psychiatric: Mood and affect are normal. Speech and behavior are normal.  ____________________________________________   LABS (all labs ordered are listed, but only abnormal results are displayed)  Labs Reviewed - No data to display ____________________________________________  EKG   ____________________________________________  RADIOLOGY   ____________________________________________   PROCEDURES  Procedure(s) performed: LACERATION REPAIR Performed by: Sable Feil Authorized by: Sable Feil Consent: Verbal consent obtained. Risks and benefits: risks, benefits and alternatives were discussed Consent given by: patient Patient identity confirmed: provided demographic data Prepped and Draped in normal sterile fashion Wound explored  Laceration Location: Second digit right hand   Laceration Length: 0.39 m  No Foreign Bodies seen or palpated  Anesthesia: Digital block   Local anesthetic: lidocaine 1% without epinephrine   Anesthetic total: 4 ML   Irrigation method: syringe Amount of cleaning: standard  Skin closure: 4-0 nylon Number of sutures:3  Technique: Interrupted Patient tolerance: Patient tolerated the procedure well with no immediate complications.   Critical Care performed: No  ____________________________________________   INITIAL IMPRESSION / ASSESSMENT AND PLAN / ED COURSE  Pertinent labs & imaging  results that were available during my care of the patient were reviewed by me and considered in my medical decision making (see chart for details).  Finger laceration. Patient given discharge Instructions. Patient given prescription for tramadol. Patient advised to have sutures removed in 10 days but this department, family doctor, or urgent care clinic ____________________________________________   FINAL CLINICAL IMPRESSION(S) / ED DIAGNOSES  Final diagnoses:  Finger laceration, initial encounter      NEW MEDICATIONS STARTED DURING THIS VISIT:  New Prescriptions   TRAMADOL (ULTRAM) 50 MG TABLET    Take 1 tablet (50 mg total) by mouth every 12 (twelve) hours as needed for moderate pain.     Note:  This document was prepared using Dragon voice recognition software and may include unintentional dictation errors.    Sable Feil, PA-C 03/07/16 2116  Nance Pear, MD 03/07/16 2135

## 2016-03-07 NOTE — Discharge Instructions (Signed)
Laceration Care, Adult  A laceration is a cut that goes through all layers of the skin. The cut also goes into the tissue that is right under the skin. Some cuts heal on their own. Others need to be closed with stitches (sutures), staples, skin adhesive strips, or wound glue. Taking care of your cut lowers your risk of infection and helps your cut to heal better.  HOW TO TAKE CARE OF YOUR CUT  For stitches or staples:  · Keep the wound clean and dry.  · If you were given a bandage (dressing), you should change it at least one time per day or as told by your doctor. You should also change it if it gets wet or dirty.  · Keep the wound completely dry for the first 24 hours or as told by your doctor. After that time, you may take a shower or a bath. However, make sure that the wound is not soaked in water until after the stitches or staples have been removed.  · Clean the wound one time each day or as told by your doctor:    Wash the wound with soap and water.    Rinse the wound with water until all of the soap comes off.    Pat the wound dry with a clean towel. Do not rub the wound.  · After you clean the wound, put a thin layer of antibiotic ointment on it as told by your doctor. This ointment:    Helps to prevent infection.    Keeps the bandage from sticking to the wound.  · Have your stitches or staples removed as told by your doctor.  If your doctor used skin adhesive strips:   · Keep the wound clean and dry.  · If you were given a bandage, you should change it at least one time per day or as told by your doctor. You should also change it if it gets dirty or wet.  · Do not get the skin adhesive strips wet. You can take a shower or a bath, but be careful to keep the wound dry.  · If the wound gets wet, pat it dry with a clean towel. Do not rub the wound.  · Skin adhesive strips fall off on their own. You can trim the strips as the wound heals. Do not remove any strips that are still stuck to the wound. They will  fall off after a while.  If your doctor used wound glue:  · Try to keep your wound dry, but you may briefly wet it in the shower or bath. Do not soak the wound in water, such as by swimming.  · After you take a shower or a bath, gently pat the wound dry with a clean towel. Do not rub the wound.  · Do not do any activities that will make you really sweaty until the skin glue has fallen off on its own.  · Do not apply liquid, cream, or ointment medicine to your wound while the skin glue is still on.  · If you were given a bandage, you should change it at least one time per day or as told by your doctor. You should also change it if it gets dirty or wet.  · If a bandage is placed over the wound, do not let the tape for the bandage touch the skin glue.  · Do not pick at the glue. The skin glue usually stays on for 5-10 days. Then, it   falls off of the skin.  General Instructions   · To help prevent scarring, make sure to cover your wound with sunscreen whenever you are outside after stitches are removed, after adhesive strips are removed, or when wound glue stays in place and the wound is healed. Make sure to wear a sunscreen of at least 30 SPF.  · Take over-the-counter and prescription medicines only as told by your doctor.  · If you were given antibiotic medicine or ointment, take or apply it as told by your doctor. Do not stop using the antibiotic even if your wound is getting better.  · Do not scratch or pick at the wound.  · Keep all follow-up visits as told by your doctor. This is important.  · Check your wound every day for signs of infection. Watch for:    Redness, swelling, or pain.    Fluid, blood, or pus.  · Raise (elevate) the injured area above the level of your heart while you are sitting or lying down, if possible.  GET HELP IF:  · You got a tetanus shot and you have any of these problems at the injection site:    Swelling.    Very bad pain.    Redness.    Bleeding.  · You have a fever.  · A wound that was  closed breaks open.  · You notice a bad smell coming from your wound or your bandage.  · You notice something coming out of the wound, such as wood or glass.  · Medicine does not help your pain.  · You have more redness, swelling, or pain at the site of your wound.  · You have fluid, blood, or pus coming from your wound.  · You notice a change in the color of your skin near your wound.  · You need to change the bandage often because fluid, blood, or pus is coming from the wound.  · You start to have a new rash.  · You start to have numbness around the wound.  GET HELP RIGHT AWAY IF:  · You have very bad swelling around the wound.  · Your pain suddenly gets worse and is very bad.  · You notice painful lumps near the wound or on skin that is anywhere on your body.  · You have a red streak going away from your wound.  · The wound is on your hand or foot and you cannot move a finger or toe like you usually can.  · The wound is on your hand or foot and you notice that your fingers or toes look pale or bluish.     This information is not intended to replace advice given to you by your health care provider. Make sure you discuss any questions you have with your health care provider.     Document Released: 03/07/2008 Document Revised: 02/03/2015 Document Reviewed: 09/15/2014  Elsevier Interactive Patient Education ©2016 [employer]

## 2016-03-07 NOTE — ED Notes (Signed)
PT reports left pointer finger tip was smashed in door this evening. Pt reports "large" amount of bleeding while at home. Bleeding under control at this time. Pt has 0.5 inch laceration to the top of pointer finger. Small amount of swelling noted to finger and brusing around laceration.

## 2016-03-07 NOTE — ED Notes (Signed)
Pt in with co lac to right Index finger when screened door closed on her finger.

## 2016-03-08 ENCOUNTER — Encounter: Payer: Self-pay | Admitting: *Deleted

## 2016-03-08 ENCOUNTER — Ambulatory Visit (INDEPENDENT_AMBULATORY_CARE_PROVIDER_SITE_OTHER): Payer: Medicare Other | Admitting: Cardiovascular Disease

## 2016-03-08 ENCOUNTER — Encounter: Payer: Self-pay | Admitting: Cardiovascular Disease

## 2016-03-08 VITALS — BP 132/80 | HR 62 | Ht 64.0 in | Wt 152.2 lb

## 2016-03-08 DIAGNOSIS — I10 Essential (primary) hypertension: Secondary | ICD-10-CM

## 2016-03-08 DIAGNOSIS — I6529 Occlusion and stenosis of unspecified carotid artery: Secondary | ICD-10-CM | POA: Diagnosis not present

## 2016-03-08 DIAGNOSIS — I6523 Occlusion and stenosis of bilateral carotid arteries: Secondary | ICD-10-CM

## 2016-03-08 MED ORDER — AMLODIPINE BESYLATE 5 MG PO TABS: 5.0000 mg | ORAL_TABLET | Freq: Every day | ORAL | Status: DC

## 2016-03-08 NOTE — Progress Notes (Signed)
Cardiology Office Note   Date:  03/08/2016   ID:  Isabel Hernandez, Isabel Hernandez 06-09-1941, MRN [id number]  PCP:  Tommi Rumps, MD  Cardiologist:   Kathlyn Sacramento, MD   Chief Complaint  Patient presents with  . other    Est. Care no complaints. Meds reviewed verbally.      History of Present Illness: Isabel Hernandez is a 75 y.o. female who is here today to establish cardiovascular care. She recently moved from [place] to live at the [facility]. She was under the care of cardiology there for nonobstructive carotid stenosis, hypertension and hyperlipidemia. No previous cardiac history. No prior history of stroke. Most recent carotid Doppler was done in January 2017 which showed mild bilateral atherosclerosis with possible ulceration in the left mid common carotid artery. Yearly follow-up was recommended. She had a nuclear stress test in February 2017 which was normal overall. She is also known to have aortic atherosclerosis. She had an event monitor in May 2015 which showed occasional PVCs but no other significant arrhythmia.  Echocardiogram in 2015 showed normal LV systolic function with an ejection fraction of 65%, mild mitral regurgitation, mild tricuspid regurgitation with mild pulmonary hypertension. Overall, she has been doing well with no reported chest pain. She has chronic exertional dyspnea with no recent worsening. Blood pressure overall has been reasonably controlled. No significant leg edema.   Past Medical History  Diagnosis Date  . Arthritis   . Cancer ([place])   . Bronchitis   . Hypertension   . Hyperlipidemia   . Colon polyps   . UTI (lower urinary tract infection)   . A-fib ([place])   . Coronary artery disease   . Mitral valve disorder   . Syncope and collapse   . Carotid artery occlusion     50% stenosis    Past Surgical History  Procedure Laterality Date  . Breast biopsy  1997, 2002  . Appendectomy    . Tonsillectomy    . Total knee arthroplasty Left   .  Cesarean section       Current Outpatient Prescriptions  Medication Sig Dispense Refill  . amLODipine (NORVASC) 5 MG tablet Take 1 tablet (5 mg total) by mouth daily. 90 tablet 3  . aspirin 81 MG tablet Take 81 mg by mouth daily.    Marland Kitchen atorvastatin (LIPITOR) 20 MG tablet Take 20 mg by mouth daily.     . benzonatate (TESSALON) 200 MG capsule Take 1 capsule (200 mg total) by mouth 2 (two) times daily as needed for cough. 20 capsule 0  . fenofibrate (TRICOR) 145 MG tablet Take 145 mg by mouth daily.     . fluticasone (FLONASE) 50 MCG/ACT nasal spray Place 2 sprays into both nostrils daily. 16 g 6  . hydrochlorothiazide (MICROZIDE) 12.5 MG capsule Take 12.5 mg by mouth daily.     Marland Kitchen loratadine (CLARITIN) 10 MG tablet Take 1 tablet (10 mg total) by mouth daily. 30 tablet 11  . losartan (COZAAR) 50 MG tablet Take 50 mg by mouth daily.     . meloxicam (MOBIC) 7.5 MG tablet Take 7.5 mg by mouth daily.     . metoprolol tartrate (LOPRESSOR) 25 MG tablet Take 25 mg by mouth 2 (two) times daily.     . ranitidine (ZANTAC) 300 MG tablet Take 300 mg by mouth at bedtime.    . traMADol (ULTRAM) 50 MG tablet Take 1 tablet (50 mg total) by mouth every 12 (twelve) hours as needed for moderate  pain. 10 tablet 0   No current facility-administered medications for this visit.    Allergies:   Biaxin; Latex; Nitroglycerin; Oxycodone; Penicillins; Tetracyclines & related; and Tizanidine    Social History:  The patient  reports that she has quit smoking. Her smoking use included Cigarettes. She quit after 10 years of use. She does not have any smokeless tobacco history on file. She reports that she drinks alcohol. She reports that she does not use illicit drugs.   Family History:  The patient's family history includes Diabetes in her brother; Heart disease in her mother; Hyperlipidemia in her brother; Hypertension in her brother; Lung cancer in her father and sister; Prostate cancer in her father.    ROS:  Please  see the history of present illness.   Otherwise, review of systems are positive for none.   All other systems are reviewed and negative.    PHYSICAL EXAM: VS:  BP 132/80 mmHg  Pulse 62  Ht 5\' 4"  (1.626 m)  Wt 152 lb 4 oz (69.06 kg)  BMI 26.12 kg/m2 , BMI Body mass index is 26.12 kg/(m^2). GEN: Well nourished, well developed, in no acute distress HEENT: normal Neck: no JVD, carotid bruits, or masses Cardiac: RRR; no murmurs, rubs, or gallops,no edema  Respiratory:  clear to auscultation bilaterally, normal work of breathing GI: soft, nontender, nondistended, + BS MS: no deformity or atrophy Skin: warm and dry, no rash Neuro:  Strength and sensation are intact Psych: euthymic mood, full affect   EKG:  EKG is ordered today. The ekg ordered today demonstrates normal sinus rhythm with no significant ST or T wave changes. No evidence of prior infarct.   Recent Labs: No results found for requested labs within last 365 days.    Lipid Panel No results found for: CHOL, TRIG, HDL, CHOLHDL, VLDL, LDLCALC, LDLDIRECT    Wt Readings from Last 3 Encounters:  03/08/16 152 lb 4 oz (69.06 kg)  03/07/16 150 lb (68.04 kg)  02/17/16 152 lb 3.2 oz (69.037 kg)      Other studies Reviewed: Additional studies/ records that were reviewed today include: Old cardiac records from previous cardiologist. Review of the above records demonstrates: Summarized above   ASSESSMENT AND PLAN:  1.  Moderate left carotid stenosis: No previous history of stroke. Continue treatment with aspirin, blood pressure control and treatment of hyperlipidemia. I requested a follow-up carotid Doppler to be done in 6 months from now.  2. Essential hypertension: Blood pressure is well controlled on current medications.  3. Mild valvular heart abnormalities: No heart murmurs by physical exam.  4. Hyperlipidemia: She is currently on atorvastatin. Recommend a target LDL of less than 100 and preferably less than  70.   Disposition:   FU with me in 6 months  Signed,  Kathlyn Sacramento, MD  03/08/2016 2:58 PM    [place]

## 2016-03-08 NOTE — Patient Instructions (Signed)
Medication Instructions:  Please continue your current medications  Labwork: None  Testing/Procedures: Your physician has requested that you have a carotid duplex. This test is an ultrasound of the carotid arteries in your neck. It looks at blood flow through these arteries that supply the brain with blood. Allow one hour for this exam. There are no restrictions or special instructions. Dr. Fletcher Anon would like for you to have this done in 6 months.  Our schedule is not open that far out, we will send you reminder in October.  Follow-Up: Your physician wants you to follow-up in: 6 months  You will receive a reminder letter in the mail two months in advance.  If you don't receive a letter, please call our office to schedule the follow-up appointment.  If you need a refill on your cardiac medications before your next appointment, please call your pharmacy.

## 2016-03-16 ENCOUNTER — Encounter: Payer: Self-pay | Admitting: Family Medicine

## 2016-03-16 ENCOUNTER — Ambulatory Visit (INDEPENDENT_AMBULATORY_CARE_PROVIDER_SITE_OTHER): Payer: Medicare Other | Admitting: Family Medicine

## 2016-03-16 VITALS — BP 118/66 | HR 68 | Temp 98.1°F | Ht 64.0 in | Wt 152.0 lb

## 2016-03-16 DIAGNOSIS — S61210D Laceration without foreign body of right index finger without damage to nail, subsequent encounter: Secondary | ICD-10-CM

## 2016-03-16 DIAGNOSIS — IMO0002 Reserved for concepts with insufficient information to code with codable children: Secondary | ICD-10-CM

## 2016-03-16 NOTE — Progress Notes (Signed)
Patient ID: Isabel Hernandez, female   DOB: 12-23-1940, 74 y.o.   MRN: [id number]  Suture Removal Patient here for suture removal from right index finger. She obtained a laceration 10 days ago, which required closure with 3 sutures in the ED. Mechanism of injury: Finger closed in screen door. She denies pain, redness, or drainage from the wound. Her last tetanus was 10 days ago.  Patient had 3 sutures placed in the emergency room 10 days ago. Has no pain, redness, or drainage from the wound. Sensation to light touch intact in the right index finger. Laceration along the radial aspect of the tip of the right index finger. Wound is well-healing with no dehiscence. No redness or drainage. 3 sutures were removed today patient tolerated this well. Alcohol swab used to clean would after removal of sutures.  Tommi Rumps, M.D. 03/16/2016 10:15 a.m.

## 2016-03-16 NOTE — Patient Instructions (Signed)
Nice to see you. We removed the sutures from your right index finger. Please monitor this area for drainage, redness, pain, and decreased sensation. If these things occur please seek medical attention.

## 2016-03-16 NOTE — Progress Notes (Signed)
Pre visit review using our clinic review tool, if applicable. No additional management support is needed unless otherwise documented below in the visit note. 

## 2016-03-17 ENCOUNTER — Telehealth: Payer: Self-pay | Admitting: *Deleted

## 2016-03-17 NOTE — Telephone Encounter (Signed)
Patient brought in a Cd with her MRI images. She will have another appt with pulmonary and has requested to have the CD to take to the pulmonary appt.  Pt contact [phone]

## 2016-03-17 NOTE — Telephone Encounter (Signed)
Reports are scanned in the system. Spoke with patient and she does not need CD back

## 2016-03-17 NOTE — Telephone Encounter (Signed)
Isabel Reef do you have this, if so has he reviewed it?

## 2016-03-22 DIAGNOSIS — I10 Essential (primary) hypertension: Secondary | ICD-10-CM | POA: Diagnosis not present

## 2016-03-22 DIAGNOSIS — R768 Other specified abnormal immunological findings in serum: Secondary | ICD-10-CM | POA: Diagnosis not present

## 2016-03-22 DIAGNOSIS — Z791 Long term (current) use of non-steroidal anti-inflammatories (NSAID): Secondary | ICD-10-CM | POA: Insufficient documentation

## 2016-03-22 DIAGNOSIS — M1712 Unilateral primary osteoarthritis, left knee: Secondary | ICD-10-CM | POA: Diagnosis not present

## 2016-03-22 DIAGNOSIS — M19041 Primary osteoarthritis, right hand: Secondary | ICD-10-CM | POA: Insufficient documentation

## 2016-03-24 ENCOUNTER — Encounter: Payer: Self-pay | Admitting: Internal Medicine

## 2016-03-24 ENCOUNTER — Ambulatory Visit (INDEPENDENT_AMBULATORY_CARE_PROVIDER_SITE_OTHER): Payer: Medicare Other | Admitting: Internal Medicine

## 2016-03-24 VITALS — BP 122/78 | HR 65 | Ht 64.0 in | Wt 150.0 lb

## 2016-03-24 DIAGNOSIS — R911 Solitary pulmonary nodule: Secondary | ICD-10-CM

## 2016-03-24 DIAGNOSIS — I6523 Occlusion and stenosis of bilateral carotid arteries: Secondary | ICD-10-CM | POA: Diagnosis not present

## 2016-03-24 DIAGNOSIS — J42 Unspecified chronic bronchitis: Secondary | ICD-10-CM

## 2016-03-24 NOTE — Progress Notes (Signed)
[facility] Consultation      Assessment and Plan:  Lung nodule. -Patient has a family history of 2 first-degree relatives with lung cancer, she also has a history of smoking. Per review the chest x-rays, the nodule seen on February 2017 were not seen on the previous chest x-ray in October 2015. --Will send for Ct chest, the patient will be following up with me after that.  COPD/Emphysema.  -Dyspnea and desats on exertion, consistent with COPD/emphysema. -We'll start her on an oral inhaler to be used once daily, she was given a sample today as well as instructed on how to use it. -We will obtain PFT before next visit.  Dyspnea on exertion with chronic hypoxic respiratory failure.  --Desats/hypoxia with exertion of 600 feet. -Per results of the LOTT study, exercise-induced hypoxia in stable COPD need not be treated with supplemental oxygen. -At subsequent visit, we will discuss possibility of checking a nocturnal oximetry to look for nocturnal hypoxia.    Date: 03/24/2016  MRN# [id number] Isabel Hernandez 1941/06/16   Isabel Hernandez is a 75 y.o. old female seen in consultation for chief complaint of:    Chief Complaint  Patient presents with  . Advice Only    ref by Biagio Quint: SOB w/activity; no other complaints    HPI:   The patient is a 75 year old female who recently saw Dr. Caryl Bis to establish care. She has a history of a sister with lung cancer.  Isabel Hernandez previously smoked for 10 years and quit in 1971. She had a chest x-ray performed which apparently showed granulomatous disease.   Her father and sister had lung cancer. She notes that when she breathes short with activity. Sometimes when she is sitting she notes that she has to take a deep breath.  She recently moved into a retirement community in [place], she has started to do some swimming and notes that her breathing is ok with these activities. When she walks about 200 feet she has mild dyspnea, sometimes  she has to stop.  She has no stairs in home, she can walk a [employer].  She denies cough, she takes no inhalers, she has never been diagnosed with respiratory disease. She last smoked 1971; she smoked about a pack per day, her husband quit around the same time.  She worked in Personal assistant, retired in 2004.   Review of outside chest x-ray report 12/01/15 left base granuloma seen, new since previous study on 07/17/14. No current films are available for review.   Echocardiogram in 2015 showed normal LV systolic function with an ejection fraction of 65%, mild mitral regurgitation, mild tricuspid regurgitation with mild pulmonary hypertension.  Beginning 02 sat on RA at rest were 98% and HR 61. She walked 600 feet in office with mild dyspnea. End sat on RA was 87% and HR 85.    PMHX:   Past Medical History  Diagnosis Date  . Arthritis   . Cancer ([place])   . Bronchitis   . Hypertension   . Hyperlipidemia   . Colon polyps   . UTI (lower urinary tract infection)   . A-fib ([place])   . Coronary artery disease   . Mitral valve disorder   . Syncope and collapse   . Carotid artery occlusion     50% stenosis   Surgical Hx:  Past Surgical History  Procedure Laterality Date  . Breast biopsy  1997, 2002  . Appendectomy    . Tonsillectomy    . Total knee arthroplasty  Left   . Cesarean section     Family Hx:  Family History  Problem Relation Age of Onset  . Lung cancer Father   . Prostate cancer Father   . Lung cancer Sister   . Hyperlipidemia Brother   . Heart disease Mother   . Hypertension Brother   . Kidney disease      Maternal grandparent  . Diabetes Brother    Social Hx:   Social History  Substance Use Topics  . Smoking status: Former Smoker -- 10 years    Types: Cigarettes  . Smokeless tobacco: Not on file  . Alcohol Use: 0.0 oz/week    0 Standard drinks or equivalent per week   Medication:   Current Outpatient Rx  Name  Route  Sig  Dispense  Refill  . amLODipine (NORVASC)  5 MG tablet   Oral   Take 1 tablet (5 mg total) by mouth daily.   90 tablet   3   . aspirin 81 MG tablet   Oral   Take 81 mg by mouth daily.         Marland Kitchen atorvastatin (LIPITOR) 20 MG tablet   Oral   Take 20 mg by mouth daily.          . benzonatate (TESSALON) 200 MG capsule   Oral   Take 1 capsule (200 mg total) by mouth 2 (two) times daily as needed for cough.   20 capsule   0   . fenofibrate (TRICOR) 145 MG tablet   Oral   Take 145 mg by mouth daily.          . fluticasone (FLONASE) 50 MCG/ACT nasal spray   Each Nare   Place 2 sprays into both nostrils daily.   16 g   6   . hydrochlorothiazide (MICROZIDE) 12.5 MG capsule   Oral   Take 12.5 mg by mouth daily.          Marland Kitchen loratadine (CLARITIN) 10 MG tablet   Oral   Take 1 tablet (10 mg total) by mouth daily.   30 tablet   11   . losartan (COZAAR) 50 MG tablet   Oral   Take 50 mg by mouth daily.          . meloxicam (MOBIC) 7.5 MG tablet   Oral   Take 7.5 mg by mouth daily.          . metoprolol tartrate (LOPRESSOR) 25 MG tablet   Oral   Take 25 mg by mouth 2 (two) times daily.          . ranitidine (ZANTAC) 300 MG tablet   Oral   Take 300 mg by mouth at bedtime.         . traMADol (ULTRAM) 50 MG tablet   Oral   Take 1 tablet (50 mg total) by mouth every 12 (twelve) hours as needed for moderate pain.   10 tablet   0       Allergies:  Biaxin; Latex; Nitroglycerin; Oxycodone; Penicillins; Tetracyclines & related; and Tizanidine  Review of Systems: Gen:  Denies  fever, sweats, chills HEENT: Denies blurred vision, double vision. bleeds, sore throat Cvc:  No dizziness, chest pain. Resp:   Denies cough or sputum production, shortness of breath Gi: Denies swallowing difficulty, stomach pain. Gu:  Denies bladder incontinence, burning urine Ext:   No Joint pain, stiffness. Skin: No skin rash,  hives  Endoc:  No polyuria, polydipsia. Psych: No depression, insomnia. Other:  All other  systems were reviewed with the patient and were negative other that what is mentioned in the HPI.   Physical Examination:   VS: There were no vitals taken for this visit.  General Appearance: No distress  Neuro:without focal findings,  speech normal,  HEENT: PERRLA, EOM intact.   Pulmonary: normal breath sounds, No wheezing.  CardiovascularNormal S1,S2.  No m/r/g.   Abdomen: Benign, Soft, non-tender. Renal:  No costovertebral tenderness  GU:  No performed at this time. Endoc: No evident thyromegaly, no signs of acromegaly. Skin:   warm, no rashes, no ecchymosis  Extremities: normal, no cyanosis, clubbing.  Other findings:    LABORATORY PANEL:   CBC No results for input(s): WBC, HGB, HCT, PLT in the last 168 hours. ------------------------------------------------------------------------------------------------------------------  Chemistries  No results for input(s): NA, K, CL, CO2, GLUCOSE, BUN, CREATININE, CALCIUM, MG, AST, ALT, ALKPHOS, BILITOT in the last 168 hours.  Invalid input(s): GFRCGP ------------------------------------------------------------------------------------------------------------------  Cardiac Enzymes No results for input(s): TROPONINI in the last 168 hours. ------------------------------------------------------------  RADIOLOGY:  No results found.     Thank  you for the consultation and for allowing [facility] to assist in the care of your patient. Our recommendations are noted above.  Please contact us if we can be of further service.   Marda Stalker, MD.  Board Certified in Internal Medicine, Pulmonary Medicine, [place], and Sleep Medicine.  [facility] Office Number: [phone]  Patricia Pesa, M.D.  Vilinda Boehringer, M.D.  Merton Border, M.D  03/24/2016

## 2016-03-24 NOTE — Addendum Note (Signed)
Addended by: Oscar La R on: 03/24/2016 12:16 PM   Modules accepted: Orders

## 2016-03-24 NOTE — Patient Instructions (Addendum)
--  Will start Anoro inhaler to be used once daily.   --CT chest with contrast re: lung nodule.   --Pulmonary function test before next visit.

## 2016-03-29 ENCOUNTER — Telehealth: Payer: Self-pay | Admitting: Internal Medicine

## 2016-03-29 NOTE — Telephone Encounter (Signed)
Informed pt that DR prefers to have the image on a disk. She states she will call the facility back and get them to mail it.  FYI for you Dr. Ashby Dawes.

## 2016-03-29 NOTE — Telephone Encounter (Signed)
Patient called and wanted to let Dr. Ashby Dawes know that she had a CT of chest w/ contrast in 2015 so he compare with the one that is scheduled. Done at [facility] in [place] and will fax to your office.

## 2016-03-30 ENCOUNTER — Telehealth: Payer: Self-pay | Admitting: Internal Medicine

## 2016-03-30 ENCOUNTER — Other Ambulatory Visit: Payer: Self-pay | Admitting: *Deleted

## 2016-03-30 MED ORDER — UMECLIDINIUM-VILANTEROL 62.5-25 MCG/INH IN AEPB
1.0000 | INHALATION_SPRAY | Freq: Every day | RESPIRATORY_TRACT | Status: DC
Start: 2016-03-30 — End: 2016-06-23

## 2016-03-30 NOTE — Telephone Encounter (Signed)
rx for anoro given. Nothing further needed.

## 2016-03-30 NOTE — Telephone Encounter (Signed)
[facility] needs a verbal order for Anoro inhaler.

## 2016-04-01 DIAGNOSIS — L300 Nummular dermatitis: Secondary | ICD-10-CM | POA: Diagnosis not present

## 2016-04-08 DIAGNOSIS — H35373 Puckering of macula, bilateral: Secondary | ICD-10-CM | POA: Diagnosis not present

## 2016-04-08 DIAGNOSIS — H2513 Age-related nuclear cataract, bilateral: Secondary | ICD-10-CM | POA: Diagnosis not present

## 2016-04-15 ENCOUNTER — Other Ambulatory Visit: Payer: Self-pay | Admitting: Obstetrics & Gynecology

## 2016-04-15 DIAGNOSIS — Z1231 Encounter for screening mammogram for malignant neoplasm of breast: Secondary | ICD-10-CM

## 2016-04-15 DIAGNOSIS — Z853 Personal history of malignant neoplasm of breast: Secondary | ICD-10-CM

## 2016-04-18 ENCOUNTER — Inpatient Hospital Stay
Admission: RE | Admit: 2016-04-18 | Discharge: 2016-04-18 | Disposition: A | Discharge status: Home / Self Care | Payer: Self-pay | Source: Ambulatory Visit | Attending: *Deleted | Admitting: *Deleted

## 2016-04-18 ENCOUNTER — Other Ambulatory Visit: Payer: Self-pay | Admitting: *Deleted

## 2016-04-18 DIAGNOSIS — Z9289 Personal history of other medical treatment: Secondary | ICD-10-CM

## 2016-04-27 ENCOUNTER — Other Ambulatory Visit: Payer: Self-pay | Admitting: Obstetrics & Gynecology

## 2016-04-27 DIAGNOSIS — Z13820 Encounter for screening for osteoporosis: Secondary | ICD-10-CM

## 2016-04-27 DIAGNOSIS — M859 Disorder of bone density and structure, unspecified: Secondary | ICD-10-CM

## 2016-04-28 DIAGNOSIS — R070 Pain in throat: Secondary | ICD-10-CM | POA: Diagnosis not present

## 2016-04-28 DIAGNOSIS — K219 Gastro-esophageal reflux disease without esophagitis: Secondary | ICD-10-CM | POA: Diagnosis not present

## 2016-05-02 ENCOUNTER — Ambulatory Visit (INDEPENDENT_AMBULATORY_CARE_PROVIDER_SITE_OTHER): Payer: Medicare Other | Admitting: *Deleted

## 2016-05-02 DIAGNOSIS — R911 Solitary pulmonary nodule: Secondary | ICD-10-CM

## 2016-05-02 LAB — PULMONARY FUNCTION TEST
DL/VA % pred: 61 %
DL/VA: 2.94 ml/min/mmHg/L
DLCO unc % pred: 95 %
DLCO unc: 23.27 ml/min/mmHg
FEF 25-75 PRE: 1.48 L/s
FEF 25-75 Post: 1.59 L/sec
FEF2575-%CHANGE-POST: 7 %
FEF2575-%PRED-POST: 100 %
FEF2575-%PRED-PRE: 92 %
FEV1-%Change-Post: 1 %
FEV1-%PRED-POST: 99 %
FEV1-%Pred-Pre: 97 %
FEV1-POST: 2.05 L
FEV1-PRE: 2.01 L
FEV1FVC-%Change-Post: 1 %
FEV1FVC-%PRED-PRE: 97 %
FEV6-%CHANGE-POST: 0 %
FEV6-%PRED-POST: 104 %
FEV6-%Pred-Pre: 104 %
FEV6-Post: 2.74 L
FEV6-Pre: 2.73 L
FEV6FVC-%Pred-Post: 105 %
FEV6FVC-%Pred-Pre: 105 %
FVC-%CHANGE-POST: 0 %
FVC-%Pred-Post: 99 %
FVC-%Pred-Pre: 99 %
FVC-Post: 2.74 L
FVC-Pre: 2.73 L
POST FEV1/FVC RATIO: 75 %
Post FEV6/FVC ratio: 100 %
Pre FEV1/FVC ratio: 73 %
Pre FEV6/FVC Ratio: 100 %

## 2016-05-02 NOTE — Progress Notes (Signed)
PFT performed today with Nitrogen washout. 

## 2016-05-03 ENCOUNTER — Ambulatory Visit
Admission: RE | Admit: 2016-05-03 | Discharge: 2016-05-03 | Disposition: A | Discharge status: Home / Self Care | Payer: Medicare Other | Source: Ambulatory Visit | Attending: Internal Medicine | Admitting: Internal Medicine

## 2016-05-03 DIAGNOSIS — R911 Solitary pulmonary nodule: Secondary | ICD-10-CM | POA: Diagnosis not present

## 2016-05-03 DIAGNOSIS — J8410 Pulmonary fibrosis, unspecified: Secondary | ICD-10-CM | POA: Insufficient documentation

## 2016-05-03 DIAGNOSIS — I700 Atherosclerosis of aorta: Secondary | ICD-10-CM | POA: Diagnosis not present

## 2016-05-03 DIAGNOSIS — K449 Diaphragmatic hernia without obstruction or gangrene: Secondary | ICD-10-CM | POA: Insufficient documentation

## 2016-05-03 LAB — POCT I-STAT CREATININE: CREATININE: 1 mg/dL (ref 0.44–1.00)

## 2016-05-03 MED ORDER — IOPAMIDOL (ISOVUE-300) INJECTION 61%
75.0000 mL | Freq: Once | INTRAVENOUS | Status: AC | PRN
  Administered 2016-05-03: 75 mL via INTRAVENOUS

## 2016-05-03 NOTE — Progress Notes (Signed)
[facility] Consultation      Assessment and Plan:  Lung nodule. -Patient has a family history of 2 first-degree relatives with lung cancer, she also has a history of smoking.  --Review of CT shows small,  low risk nodules present in the lungs --Repeat CT chest in one year, we'll order this at her next visit. An approximate 6 months.   Asthma and/or aspiration pneumonitis.  -Dyspnea on exertion, COPD, emphysema is not borne out on pulmonary function testing, or on CT scanning. -Currently using an Anoro inhaler with little benefit, she was advised to stop, and switch to pro-air inhaler 2 puffs when necessary for dyspnea. -I suspect that her dyspnea/asthma is predominantly due to aspiration pneumonitis secondary to esophageal stasis and reflux.  GERD with dysphagia and aspiration.  --Discussed strategies to reduce reflux including avoiding fatty meals, and not eating 4 hours before bedtime.  -The patient has a history of esophageal narrowing/stricture, for which she has undergone a esophageal dilatation earlier this year. Given her recurrent symptoms. She is now referred back to gastroenterology. In the meantime, I have asked her to continue her newly started Prilosec  Dyspnea on exertion with chronic hypoxic respiratory failure.  --I suspect that this is secondary to aspiration pneumonitis due to Esophageal narrowing, with reflux.    Date: 05/03/2016  MRN# [id number] Isabel Hernandez April 17, 1941   Isabel Hernandez is a 75 y.o. old female seen in consultation for chief complaint of:    Chief Complaint  Patient presents with  . Follow-up    6wk ROV. CT/PFT results. breathing is baseline. sob w/exertion & occ chest tightness. she doesn't feel any improvment with anoro.     HPI:   The patient is a 75 year old female who recently saw Dr. Caryl Bis to establish care. She has a history of a sister with lung cancer.  Lara previously smoked for 10 years and quit in 1971. She had a  chest x-ray performed which apparently showed granulomatous disease.   Her father and sister had lung cancer. She notes that she has some dyspnea with moderate exertion. She notes that she has periodic trouble with breathing, she also has periodic trouble with swallowing, she has periodic stretching of the esophagus, last was in March. Currently noticing that she has trouble with food and liquids getting blocked in the throat and sometimes has trouble swallowing water. This is daily, at almost every meal.    CT chest 05/03/16 images  reviewed; 1-75mm nodule noted and 61mm nodules noted in right lung. There is some scattered infiltrates at the bases bilaterally. These are very subtle. No other abnormalities noted.  Review of outside chest x-ray report 12/01/15 left base granuloma seen, new since previous study on 07/17/14. No current films are available for review.   Echocardiogram in 2015 showed normal LV systolic function with an ejection fraction of 65%, mild mitral regurgitation, mild tricuspid regurgitation with mild pulmonary hypertension.   Review of bony function testing performed on 05/02/16: Spirometry FVC was 99% of predicted, FEV1 was 97% predicted, there is no significant improvement with bronchodilator. FEV to FVC ratio was 73%. Lung volumes. Residual volume was 83% of predicted, RV/TLC ratio is 112% of predicted, total lung capacity was 74% of predicted. Diffusion: Diffusion capacity was 95% of predicted. Flow volume loop was normal. Interpretation: Overall this test shows normal pulmonary functions without evidence of obstructive lung disease.  PMHX:   Past Medical History:  Diagnosis Date  . A-fib ([place])   . Arthritis   .  Bronchitis   . Cancer ([place])   . 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 Colon polyps   . Coronary artery disease   . Hyperlipidemia   . Hypertension   . Mitral valve disorder   . Syncope and collapse   . UTI (lower urinary tract infection)     Surgical Hx:  Past Surgical History:  Procedure Laterality Date  . APPENDECTOMY    . BREAST BIOPSY  1997, 2002  . CESAREAN SECTION    . TONSILLECTOMY    . TOTAL KNEE ARTHROPLASTY Left    Family Hx:  Family History  Problem Relation Age of Onset  . Lung cancer Father   . Prostate cancer Father   . Lung cancer Sister   . Hyperlipidemia Brother   . Heart disease Mother   . Hypertension Brother   . Kidney disease      Maternal grandparent  . Diabetes Brother    Social Hx:   Social History  Substance Use Topics  . Smoking status: Former Smoker    Years: 10.00    Types: Cigarettes  . Smokeless tobacco: Not on file  . Alcohol use 0.0 oz/week   Medication:    Reviewed   Allergies:  Biaxin [clarithromycin]; Latex; Nitroglycerin; Oxycodone; Penicillins; Tetracyclines & related; and Tizanidine  Review of Systems: Gen:  Denies  fever, sweats, chills HEENT: Denies blurred vision, double vision. bleeds, Cvc:  No dizziness, chest pain. Resp:   Denies cough or sputum production, shortness of breath Gi: Denies swallowing difficulty, stomach pain. Gu:  Denies bladder incontinence, burning urine Ext:   No Joint pain, stiffness. Skin: No skin rash,  hives  Endoc:  No polyuria, polydipsia. Psych: No depression, insomnia. Other:  All other systems were reviewed with the patient and were negative other that what is mentioned in the HPI.   Physical Examination:   VS: BP 132/70 (BP Location: Left Arm, Cuff Size: Normal)   Pulse 65   Ht 5\' 4"  (1.626 m)   Wt 155 lb 3.2 oz (70.4 kg)   SpO2 98%   BMI 26.64 kg/m   General Appearance: No distress  Neuro:without focal findings,  speech normal,  HEENT: PERRLA, EOM intact.   Pulmonary: Scattered bibasilar crackles heard posteriorly. CardiovascularNormal S1,S2.  No m/r/g.   Abdomen: Benign, Soft, non-tender. Renal:  No costovertebral tenderness  GU:  No performed at this time. Endoc: No evident thyromegaly, no signs of  acromegaly. Skin:   warm, no rashes, no ecchymosis  Extremities: normal, no cyanosis, clubbing.  Other findings:    LABORATORY PANEL:   CBC No results for input(s): WBC, HGB, HCT, PLT in the last 168 hours. ------------------------------------------------------------------------------------------------------------------  Chemistries   Recent Labs Lab 05/03/16 0948  CREATININE 1.00   ------------------------------------------------------------------------------------------------------------------  Cardiac Enzymes No results for input(s): TROPONINI in the last 168 hours. ------------------------------------------------------------  RADIOLOGY:  Ct Chest W Contrast  Result Date: 05/03/2016 CLINICAL DATA:  History of lung nodules detected on prior CT chest with nodule questioned on chest x-ray of March of 2017 ---none of these outside imaging studies are available for comparison EXAM: CT CHEST WITH CONTRAST TECHNIQUE: Multidetector CT imaging of the chest was performed during intravenous contrast administration. CONTRAST:  31mL ISOVUE-300 IOPAMIDOL (ISOVUE-300) INJECTION 61% COMPARISON:  None. FINDINGS: Cardiovascular: The mid ascending thoracic aorta measures 31 mm in diameter. No abnormality of the thoracic aorta or pulmonary arteries is seen. There is faint calcification of the proximal left anterior descending artery. The heart is within upper  limits of normal. No pericardial effusion is seen moderate thoracic aortic atherosclerotic change is noted. Mediastinum/Nodes: No mediastinal or hilar adenopathy is seen. Lungs/Pleura: On lung window images, within the lingula, there is a 7 mm calcified granuloma present. A 3 mm noncalcified nodule is also present in the inferior right middle lobe anteriorly on image 98, series 3. Linear scarring or atelectasis shows is present in both lower lobes posteriorly. No additional lung nodule is seen. No pleural effusion is noted. The central airway is  patent. Upper Abdomen: The liver somewhat low in attenuation and mild fatty infiltration cannot be excluded. There does appear to be a small hiatal hernia present. Musculoskeletal: Mild degenerative change is noted within the mid to lower thoracic spine. No compression deformity is seen. IMPRESSION: 1. 7 mm calcified granuloma in the lingula consistent with prior granulomatous disease. 2. 3 mm noncalcified nodule in the inferior right middle lobe. No follow-up needed if patient is low-risk. Non-contrast chest CT can be considered in 12 months if patient is high-risk. This recommendation follows the consensus statement: Guidelines for Management of Incidental Pulmonary Nodules Detected on CT Images:From the Fleischner Society 2017; published online before print (10.1148/radiol.IJ:2314499). 3. Thoracic aortic atherosclerosis. 4. Question mild fatty infiltration of the liver. Correlate with LFTs. 5. Small hiatal hernia. Electronically Signed   By: Ivar Drape M.D.   On: 05/03/2016 10:37       Thank  you for the consultation and for allowing [facility] to assist in the care of your patient. Our recommendations are noted above.  Please contact us if we can be of further service.   Marda Stalker, MD.  Board Certified in Internal Medicine, Pulmonary Medicine, [place], and Sleep Medicine.  [facility] Office Number: [phone]  Patricia Pesa, M.D.  Vilinda Boehringer, M.D.  Merton Border, M.D  05/03/2016

## 2016-05-05 ENCOUNTER — Ambulatory Visit (INDEPENDENT_AMBULATORY_CARE_PROVIDER_SITE_OTHER): Payer: Medicare Other | Admitting: Internal Medicine

## 2016-05-05 ENCOUNTER — Encounter: Payer: Self-pay | Admitting: Internal Medicine

## 2016-05-05 VITALS — BP 132/70 | HR 65 | Ht 64.0 in | Wt 155.2 lb

## 2016-05-05 DIAGNOSIS — I6523 Occlusion and stenosis of bilateral carotid arteries: Secondary | ICD-10-CM | POA: Diagnosis not present

## 2016-05-05 DIAGNOSIS — J42 Unspecified chronic bronchitis: Secondary | ICD-10-CM | POA: Diagnosis not present

## 2016-05-05 MED ORDER — ALBUTEROL SULFATE HFA 108 (90 BASE) MCG/ACT IN AERS: 2.0000 | INHALATION_SPRAY | Freq: Four times a day (QID) | RESPIRATORY_TRACT | 2 refills | Status: DC | PRN

## 2016-05-05 NOTE — Patient Instructions (Addendum)
--  Stop anoro. Start albuterol inhaler 2 puffs as needed.   --See your gastroenterologist about your reflux and choking symptoms.   --Avoid eating 4 hours before bedtime. Avoid fatty foods later in the day.

## 2016-05-05 NOTE — Progress Notes (Signed)
Patient ID: Isabel Hernandez, female   DOB: May 12, 1941, 75 y.o.   MRN: [id number] Patient seen in the office today and instructed on use of HFA.  Patient expressed understanding and demonstrated technique.

## 2016-05-06 ENCOUNTER — Other Ambulatory Visit: Payer: Self-pay | Admitting: Obstetrics & Gynecology

## 2016-05-06 ENCOUNTER — Ambulatory Visit
Admission: RE | Admit: 2016-05-06 | Discharge: 2016-05-06 | Disposition: A | Discharge status: Home / Self Care | Payer: Medicare Other | Source: Ambulatory Visit | Attending: Obstetrics & Gynecology | Admitting: Obstetrics & Gynecology

## 2016-05-06 DIAGNOSIS — Z1231 Encounter for screening mammogram for malignant neoplasm of breast: Secondary | ICD-10-CM | POA: Insufficient documentation

## 2016-05-06 DIAGNOSIS — Z853 Personal history of malignant neoplasm of breast: Secondary | ICD-10-CM

## 2016-05-06 HISTORY — DX: Malignant neoplasm of unspecified site of unspecified female breast: C50.919

## 2016-05-18 ENCOUNTER — Ambulatory Visit: Payer: Medicare Other

## 2016-05-19 ENCOUNTER — Ambulatory Visit (INDEPENDENT_AMBULATORY_CARE_PROVIDER_SITE_OTHER): Payer: Medicare Other | Admitting: Family Medicine

## 2016-05-19 ENCOUNTER — Encounter: Payer: Self-pay | Admitting: Family Medicine

## 2016-05-19 VITALS — BP 112/68 | HR 65 | Temp 98.2°F | Wt 157.6 lb

## 2016-05-19 DIAGNOSIS — Z23 Encounter for immunization: Secondary | ICD-10-CM | POA: Diagnosis not present

## 2016-05-19 DIAGNOSIS — J690 Pneumonitis due to inhalation of food and vomit: Secondary | ICD-10-CM | POA: Diagnosis not present

## 2016-05-19 DIAGNOSIS — I6523 Occlusion and stenosis of bilateral carotid arteries: Secondary | ICD-10-CM | POA: Diagnosis not present

## 2016-05-19 DIAGNOSIS — J189 Pneumonia, unspecified organism: Secondary | ICD-10-CM | POA: Insufficient documentation

## 2016-05-19 DIAGNOSIS — I10 Essential (primary) hypertension: Secondary | ICD-10-CM

## 2016-05-19 DIAGNOSIS — K219 Gastro-esophageal reflux disease without esophagitis: Secondary | ICD-10-CM | POA: Insufficient documentation

## 2016-05-19 DIAGNOSIS — R1310 Dysphagia, unspecified: Secondary | ICD-10-CM

## 2016-05-19 DIAGNOSIS — R21 Rash and other nonspecific skin eruption: Secondary | ICD-10-CM | POA: Insufficient documentation

## 2016-05-19 LAB — BASIC METABOLIC PANEL
BUN: 22 mg/dL (ref 6–23)
CALCIUM: 9.9 mg/dL (ref 8.4–10.5)
CO2: 29 meq/L (ref 19–32)
Chloride: 102 mEq/L (ref 96–112)
Creatinine, Ser: 1.06 mg/dL (ref 0.40–1.20)
GFR: 53.49 mL/min — AB (ref >60.00)
Glucose, Bld: 101 mg/dL — ABNORMAL HIGH (ref 70–99)
POTASSIUM: 4.3 meq/L (ref 3.5–5.1)
SODIUM: 138 meq/L (ref 135–145)

## 2016-05-19 NOTE — Assessment & Plan Note (Signed)
Patient with discomfort and trouble on swallowing at times. No weight loss. She'll continue Prilosec and see GI as scheduled.

## 2016-05-19 NOTE — Assessment & Plan Note (Signed)
Potential contact dermatitis. She'll continue topical steroid and follow up with dermatology as scheduled.

## 2016-05-19 NOTE — Assessment & Plan Note (Signed)
Per patient report and review pulmonology note it appears that this is the potential diagnosis for her shortness of breath. She's had quite significant workup including CT scan of her chest and stress test. Nothing has quite lead to a cause of this. Discussed that she needs to keep the appointment with the GI physician regarding possible aspiration and dysphagia. She'll continue the Prilosec. Discussed that the albuterol is not that beneficial she shouldn't use it. She'll continue to monitor and she is given return precautions.

## 2016-05-19 NOTE — Progress Notes (Signed)
Pre visit review using our clinic review tool, if applicable. No additional management support is needed unless otherwise documented below in the visit note. 

## 2016-05-19 NOTE — Progress Notes (Signed)
Tommi Rumps, MD Phone: [phone]  Isabel Hernandez is a 75 y.o. female who presents today for follow-up.  Aspiration pneumonitis: Patient has been followed by pulmonology for this. Had a CT scan which she states they thought showed some possible pneumonitis. She has had some trouble with food getting stuck intermittently and having discomfort on swallowing liquids and solids at times and this led them to think that maybe she has been aspirating some of her reflux. She gets very short of breath with walking at times. No chest pain. She was on an inhaler previously that was of no benefit. Currently has albuterol that does not benefit her either. Had a negative stress test earlier this year at the start of workup of this issue. Has an appointment with GI next month.  Dysphagia: Does note some reflux. Also notes some dysphagia. Taking Prilosec. Still has sensation of food getting stuck at times. No blood in her stool.  HYPERTENSION  Disease Monitoring  Home BP Monitoring not checking Chest pain- no    Dyspnea- yes, see above Medications  Compliance-  taking losartan, amlodipine, and HCTZ.  Edema- no  Also notes small areas of rash on her legs and arms since moving into her new living facility. She thinks the handle in the shower is made of a nickel and she is allergic to nickel. She has seen a dermatologist for this and is going to follow-up with them. She's been using some topical steroid on this.   PMH: Former smoker   ROS see history of present illness  Objective  Physical Exam Vitals:   05/19/16 1325  BP: 112/68  Pulse: 65  Temp: 98.2 F (36.8 C)    BP Readings from Last 3 Encounters:  05/19/16 112/68  05/05/16 132/70  03/24/16 122/78   Wt Readings from Last 3 Encounters:  05/19/16 157 lb 9.6 oz (71.5 kg)  05/05/16 155 lb 3.2 oz (70.4 kg)  03/24/16 150 lb (68 kg)    Physical Exam  Constitutional: No distress.  HENT:  Head: Normocephalic and atraumatic.    Mouth/Throat: Oropharynx is clear and moist. No oropharyngeal exudate.  Eyes: Conjunctivae are normal. Pupils are equal, round, and reactive to light.  Cardiovascular: Normal rate, regular rhythm and normal heart sounds.   Pulmonary/Chest: Effort normal and breath sounds normal.  Neurological: She is alert. Gait normal.  Skin: Skin is warm and dry. She is not diaphoretic.  Scattered patches of erythematous excoriated papules on her lower arms and lower legs     Assessment/Plan: Please see individual problem list.  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 She's had quite significant workup including CT scan of her chest and stress test. Nothing has quite lead to a cause of this. Discussed that she needs to keep the appointment with the GI physician regarding possible aspiration and dysphagia. She'll continue the Prilosec. Discussed that the albuterol is not that beneficial she shouldn't use it. She'll continue to monitor and she is given return precautions.  Essential hypertension At goal. Continue current medications. Check a BMP today.  Dysphagia Patient with discomfort and trouble on swallowing at times. No weight loss. She'll continue Prilosec and see GI as scheduled.  Rash and nonspecific skin eruption Potential contact dermatitis. She'll continue topical steroid and follow up with dermatology as scheduled.   Orders Placed This Encounter  Procedures  . Basic Metabolic Panel (BMET)   Patient is due  for third hepatitis B vaccination and Pneumovax today. These were given.  Tommi Rumps, MD [place]

## 2016-05-19 NOTE — Patient Instructions (Addendum)
Nice to see you. Please continue to monitor your breathing and follow-up with GI for evaluation of possible aspiration and reflux. Please keep your follow-up with her dermatologist. If you develop chest pain, worsening shortness of breath, or any new or changing symptoms please seek medical attention.

## 2016-05-19 NOTE — Assessment & Plan Note (Signed)
At goal. Continue current medications. Check a BMP today.

## 2016-05-23 ENCOUNTER — Ambulatory Visit
Admission: RE | Admit: 2016-05-23 | Discharge: 2016-05-23 | Disposition: A | Discharge status: Home / Self Care | Payer: Medicare Other | Source: Ambulatory Visit | Attending: Obstetrics & Gynecology | Admitting: Obstetrics & Gynecology

## 2016-05-23 DIAGNOSIS — Z780 Asymptomatic menopausal state: Secondary | ICD-10-CM | POA: Insufficient documentation

## 2016-05-23 DIAGNOSIS — M859 Disorder of bone density and structure, unspecified: Secondary | ICD-10-CM | POA: Diagnosis present

## 2016-05-23 DIAGNOSIS — M85852 Other specified disorders of bone density and structure, left thigh: Secondary | ICD-10-CM | POA: Diagnosis not present

## 2016-05-31 DIAGNOSIS — H2511 Age-related nuclear cataract, right eye: Secondary | ICD-10-CM | POA: Diagnosis not present

## 2016-06-01 ENCOUNTER — Encounter: Payer: Self-pay | Admitting: *Deleted

## 2016-06-03 DIAGNOSIS — L309 Dermatitis, unspecified: Secondary | ICD-10-CM | POA: Diagnosis not present

## 2016-06-07 ENCOUNTER — Ambulatory Visit: Payer: Medicare Other | Admitting: Anesthesiology

## 2016-06-07 ENCOUNTER — Encounter
Admission: RE | Disposition: A | Discharge status: Home / Self Care | Payer: Self-pay | Source: Ambulatory Visit | Attending: Ophthalmology

## 2016-06-07 ENCOUNTER — Encounter: Payer: Self-pay | Admitting: *Deleted

## 2016-06-07 ENCOUNTER — Ambulatory Visit
Admission: RE | Admit: 2016-06-07 | Discharge: 2016-06-07 | Disposition: A | Discharge status: Home / Self Care | Payer: Medicare Other | Source: Ambulatory Visit | Attending: Ophthalmology | Admitting: Ophthalmology

## 2016-06-07 DIAGNOSIS — H2511 Age-related nuclear cataract, right eye: Secondary | ICD-10-CM | POA: Insufficient documentation

## 2016-06-07 DIAGNOSIS — Z91040 Latex allergy status: Secondary | ICD-10-CM | POA: Diagnosis not present

## 2016-06-07 DIAGNOSIS — K589 Irritable bowel syndrome without diarrhea: Secondary | ICD-10-CM | POA: Insufficient documentation

## 2016-06-07 DIAGNOSIS — Z96659 Presence of unspecified artificial knee joint: Secondary | ICD-10-CM | POA: Diagnosis not present

## 2016-06-07 DIAGNOSIS — M8580 Other specified disorders of bone density and structure, unspecified site: Secondary | ICD-10-CM | POA: Diagnosis not present

## 2016-06-07 DIAGNOSIS — J40 Bronchitis, not specified as acute or chronic: Secondary | ICD-10-CM | POA: Diagnosis not present

## 2016-06-07 DIAGNOSIS — K449 Diaphragmatic hernia without obstruction or gangrene: Secondary | ICD-10-CM | POA: Insufficient documentation

## 2016-06-07 DIAGNOSIS — Z880 Allergy status to penicillin: Secondary | ICD-10-CM | POA: Insufficient documentation

## 2016-06-07 DIAGNOSIS — I6521 Occlusion and stenosis of right carotid artery: Secondary | ICD-10-CM | POA: Diagnosis not present

## 2016-06-07 DIAGNOSIS — I4891 Unspecified atrial fibrillation: Secondary | ICD-10-CM | POA: Diagnosis not present

## 2016-06-07 DIAGNOSIS — M1990 Unspecified osteoarthritis, unspecified site: Secondary | ICD-10-CM | POA: Diagnosis not present

## 2016-06-07 DIAGNOSIS — E7800 Pure hypercholesterolemia, unspecified: Secondary | ICD-10-CM | POA: Insufficient documentation

## 2016-06-07 DIAGNOSIS — Z853 Personal history of malignant neoplasm of breast: Secondary | ICD-10-CM | POA: Insufficient documentation

## 2016-06-07 DIAGNOSIS — K219 Gastro-esophageal reflux disease without esophagitis: Secondary | ICD-10-CM | POA: Diagnosis not present

## 2016-06-07 DIAGNOSIS — I2510 Atherosclerotic heart disease of native coronary artery without angina pectoris: Secondary | ICD-10-CM | POA: Insufficient documentation

## 2016-06-07 DIAGNOSIS — I739 Peripheral vascular disease, unspecified: Secondary | ICD-10-CM | POA: Diagnosis not present

## 2016-06-07 DIAGNOSIS — Z885 Allergy status to narcotic agent status: Secondary | ICD-10-CM | POA: Diagnosis not present

## 2016-06-07 DIAGNOSIS — I10 Essential (primary) hypertension: Secondary | ICD-10-CM | POA: Insufficient documentation

## 2016-06-07 DIAGNOSIS — Z881 Allergy status to other antibiotic agents status: Secondary | ICD-10-CM | POA: Insufficient documentation

## 2016-06-07 DIAGNOSIS — Z87891 Personal history of nicotine dependence: Secondary | ICD-10-CM | POA: Insufficient documentation

## 2016-06-07 DIAGNOSIS — K5790 Diverticulosis of intestine, part unspecified, without perforation or abscess without bleeding: Secondary | ICD-10-CM | POA: Diagnosis not present

## 2016-06-07 DIAGNOSIS — Z888 Allergy status to other drugs, medicaments and biological substances status: Secondary | ICD-10-CM | POA: Diagnosis not present

## 2016-06-07 HISTORY — DX: Personal history of other diseases of the digestive system: Z87.19

## 2016-06-07 HISTORY — DX: Diverticulosis of intestine, part unspecified, without perforation or abscess without bleeding: K57.90

## 2016-06-07 HISTORY — DX: Reserved for inherently not codable concepts without codable children: IMO0001

## 2016-06-07 HISTORY — DX: Occlusion and stenosis of unspecified carotid artery: I65.29

## 2016-06-07 HISTORY — DX: Irritable bowel syndrome, unspecified: K58.9

## 2016-06-07 HISTORY — DX: Other complications of anesthesia, initial encounter: T88.59XA

## 2016-06-07 HISTORY — PX: CATARACT EXTRACTION W/PHACO: SHX586

## 2016-06-07 HISTORY — DX: Gastro-esophageal reflux disease without esophagitis: K21.9

## 2016-06-07 HISTORY — DX: Other allergy status, other than to drugs and biological substances: Z91.09

## 2016-06-07 HISTORY — DX: Adverse effect of unspecified anesthetic, initial encounter: T41.45XA

## 2016-06-07 SURGERY — PHACOEMULSIFICATION, CATARACT, WITH IOL INSERTION
Anesthesia: Monitor Anesthesia Care | Site: Eye | Laterality: Right | Wound class: Clean

## 2016-06-07 MED ORDER — FENTANYL CITRATE (PF) 100 MCG/2ML IJ SOLN
INTRAMUSCULAR | Status: DC | PRN
  Administered 2016-06-07: 50 ug via INTRAVENOUS

## 2016-06-07 MED ORDER — MOXIFLOXACIN HCL 0.5 % OP SOLN: 1.0000 [drp] | OPHTHALMIC | Status: DC | PRN

## 2016-06-07 MED ORDER — MOXIFLOXACIN HCL 0.5 % OP SOLN
OPHTHALMIC | Status: DC | PRN
  Administered 2016-06-07: 1 [drp] via OPHTHALMIC

## 2016-06-07 MED ORDER — EPINEPHRINE HCL 1 MG/ML IJ SOLN
INTRAMUSCULAR | Status: AC
  Filled 2016-06-07: qty 1

## 2016-06-07 MED ORDER — TETRACAINE HCL 0.5 % OP SOLN
1.0000 [drp] | Freq: Once | OPHTHALMIC | Status: AC
  Administered 2016-06-07: 1 [drp] via OPHTHALMIC

## 2016-06-07 MED ORDER — NA CHONDROIT SULF-NA HYALURON 40-17 MG/ML IO SOLN
INTRAOCULAR | Status: AC
  Filled 2016-06-07: qty 1

## 2016-06-07 MED ORDER — MIDAZOLAM HCL 2 MG/2ML IJ SOLN
INTRAMUSCULAR | Status: DC | PRN
  Administered 2016-06-07: 0.5 mg via INTRAVENOUS
  Administered 2016-06-07: .5 mg via INTRAVENOUS

## 2016-06-07 MED ORDER — POVIDONE-IODINE 5 % OP SOLN
1.0000 "application " | Freq: Once | OPHTHALMIC | Status: AC
  Administered 2016-06-07: 1 via OPHTHALMIC

## 2016-06-07 MED ORDER — CEFUROXIME OPHTHALMIC INJECTION 1 MG/0.1 ML
INJECTION | OPHTHALMIC | Status: AC
  Filled 2016-06-07: qty 0.1

## 2016-06-07 MED ORDER — BSS IO SOLN
INTRAOCULAR | Status: DC | PRN
  Administered 2016-06-07: 1 mL via OPHTHALMIC

## 2016-06-07 MED ORDER — SODIUM CHLORIDE 0.9 % IV SOLN
INTRAVENOUS | Status: DC
  Administered 2016-06-07: 08:00:00 via INTRAVENOUS

## 2016-06-07 MED ORDER — ACETYLCHOLINE CHLORIDE 20 MG IO SOLR
INTRAOCULAR | Status: DC | PRN
  Administered 2016-06-07: .5 mg via INTRAOCULAR

## 2016-06-07 MED ORDER — ARMC OPHTHALMIC DILATING GEL
1.0000 "application " | OPHTHALMIC | Status: AC
  Administered 2016-06-07 (×2): 1 via OPHTHALMIC

## 2016-06-07 MED ORDER — NA CHONDROIT SULF-NA HYALURON 40-17 MG/ML IO SOLN
INTRAOCULAR | Status: DC | PRN
  Administered 2016-06-07: 1 mL via INTRAOCULAR

## 2016-06-07 SURGICAL SUPPLY — 21 items
CANNULA ANT/CHMB 27GA (MISCELLANEOUS) ×3 IMPLANT
CUP MEDICINE 2OZ PLAST GRAD ST (MISCELLANEOUS) ×3 IMPLANT
GLOVE BIO SURGEON STRL SZ8 (GLOVE) ×3 IMPLANT
GLOVE BIOGEL M 6.5 STRL (GLOVE) ×3 IMPLANT
GLOVE SURG LX 8.0 MICRO (GLOVE) ×2
GLOVE SURG LX STRL 8.0 MICRO (GLOVE) ×1 IMPLANT
GOWN STRL REUS W/ TWL LRG LVL3 (GOWN DISPOSABLE) ×2 IMPLANT
GOWN STRL REUS W/TWL LRG LVL3 (GOWN DISPOSABLE) ×4
LENS IOL TECNIS ITEC 26.0 (Intraocular Lens) ×3 IMPLANT
PACK CATARACT (MISCELLANEOUS) ×3 IMPLANT
PACK CATARACT BRASINGTON LX (MISCELLANEOUS) ×3 IMPLANT
PACK EYE AFTER SURG (MISCELLANEOUS) ×3 IMPLANT
SOL BSS BAG (MISCELLANEOUS) ×3
SOL PREP PVP 2OZ (MISCELLANEOUS) ×3
SOLUTION BSS BAG (MISCELLANEOUS) ×1 IMPLANT
SOLUTION PREP PVP 2OZ (MISCELLANEOUS) ×1 IMPLANT
SYR 3ML LL SCALE MARK (SYRINGE) ×3 IMPLANT
SYR 5ML LL (SYRINGE) ×3 IMPLANT
SYR TB 1ML 27GX1/2 LL (SYRINGE) ×3 IMPLANT
WATER STERILE IRR 250ML POUR (IV SOLUTION) ×3 IMPLANT
WIPE NON LINTING 3.25X3.25 (MISCELLANEOUS) ×3 IMPLANT

## 2016-06-07 NOTE — Anesthesia Procedure Notes (Signed)
Procedure Name: MAC Date/Time: 06/07/2016 8:50 AM Performed by: Allean Found Pre-anesthesia Checklist: Patient identified, Emergency Drugs available, Suction available, Patient being monitored and Timeout performed Patient Re-evaluated:Patient Re-evaluated prior to inductionOxygen Delivery Method: Nasal cannula

## 2016-06-07 NOTE — Transfer of Care (Signed)
Immediate Anesthesia Transfer of Care Note  Patient: Isabel Hernandez  Procedure(s) Performed: Procedure(s) with comments: CATARACT EXTRACTION PHACO AND INTRAOCULAR LENS PLACEMENT (IOC) (Right) - [place] 00:51 AP% 19.6 CDE 10.09 Fluid pack lot # [id number]  Patient Location: PACU  Anesthesia Type:MAC  Level of Consciousness: awake  Airway & Oxygen Therapy: Patient Spontanous Breathing  Post-op Assessment: Report given to RN  Post vital signs: Reviewed and stable  Last Vitals:  Vitals:   06/07/16 0743 06/07/16 0918  BP: (!) 141/78   Pulse: 63 (!) 58  Resp: 18 17  Temp: 36.9 C 36.9 C    Last Pain:  Vitals:   06/07/16 0918  TempSrc: Tympanic         Complications: No apparent anesthesia complications

## 2016-06-07 NOTE — Op Note (Signed)
PREOPERATIVE DIAGNOSIS:  Nuclear sclerotic cataract of the right eye.   POSTOPERATIVE DIAGNOSIS:  nuclear sclerotic cataract right eye   OPERATIVE PROCEDURE: Procedure(s): CATARACT EXTRACTION PHACO AND INTRAOCULAR LENS PLACEMENT (IOC)   SURGEON:  Birder Robson, MD.   ANESTHESIA:  Anesthesiologist: Alvin Critchley, MD CRNA: Demetrius Charity, CRNA; Allean Found, CRNA  1.      Managed anesthesia care. 2.      Topical tetracaine drops followed by 2% Xylocaine jelly applied in the preoperative holding area.   COMPLICATIONS:  None.   TECHNIQUE:   Stop and chop   DESCRIPTION OF PROCEDURE:  The patient was examined and consented in the preoperative holding area where the aforementioned topical anesthesia was applied to the right eye and then brought back to the Operating Room where the right eye was prepped and draped in the usual sterile ophthalmic fashion and a lid speculum was placed. A paracentesis was created with the side port blade and the anterior chamber was filled with viscoelastic. A near clear corneal incision was performed with the steel keratome. A continuous curvilinear capsulorrhexis was performed with a cystotome followed by the capsulorrhexis forceps. Hydrodissection and hydrodelineation were carried out with BSS on a blunt cannula. The lens was removed in a stop and chop  technique and the remaining cortical material was removed with the irrigation-aspiration handpiece. The capsular bag was inflated with viscoelastic and the Technis [id number]  lens was placed in the capsular bag without complication. The remaining viscoelastic was removed from the eye with the irrigation-aspiration handpiece. The wounds were hydrated. The anterior chamber was flushed with Miostat and the eye was inflated to physiologic pressure. 0.2 mL of Vigamox diluted three/one with BSS was placed in the anterior chamber. The wounds were found to be water tight. The eye was dressed with Vigamox. The patient was given  protective glasses to wear throughout the day and a shield with which to sleep tonight. The patient was also given drops with which to begin a drop regimen today and will follow-up with me in one day.  Implant Name Type Inv. Item Serial No. Manufacturer Lot No. LRB No. Used  LENS IOL DIOP 26.0 - [id number] Intraocular Lens LENS IOL DIOP 26.0 [id number]   Right 1   Procedure(s) with comments: CATARACT EXTRACTION PHACO AND INTRAOCULAR LENS PLACEMENT (IOC) (Right) - [place] 00:51 AP% 19.6 CDE 10.09 Fluid pack lot # [id number]  Electronically signed: Sherrice Creekmore LOUIS 06/07/2016 9:15 AM

## 2016-06-07 NOTE — Anesthesia Postprocedure Evaluation (Signed)
Anesthesia Post Note  Patient: Isabel Hernandez  Procedure(s) Performed: Procedure(s) (LRB): CATARACT EXTRACTION PHACO AND INTRAOCULAR LENS PLACEMENT (IOC) (Right)  Patient location during evaluation: PACU Anesthesia Type: MAC Level of consciousness: awake Pain management: pain level controlled Vital Signs Assessment: post-procedure vital signs reviewed and stable Respiratory status: spontaneous breathing Cardiovascular status: blood pressure returned to baseline and stable Postop Assessment: no headache Anesthetic complications: no    Last Vitals:  Vitals:   06/07/16 0743 06/07/16 0918  BP: (!) 141/78 121/69  Pulse: 63 (!) 58  Resp: 18 17  Temp: 36.9 C 36.9 C    Last Pain:  Vitals:   06/07/16 0918  TempSrc: Tympanic                 Buckner Malta

## 2016-06-07 NOTE — H&P (Signed)
  All labs reviewed. Abnormal studies sent to patients PCP when indicated.  Previous H&P reviewed, patient examined, there are NO CHANGES.  Isabel Hernandez LOUIS9/5/20178:50 AM

## 2016-06-07 NOTE — Anesthesia Preprocedure Evaluation (Signed)
Anesthesia Evaluation  Patient identified by MRN, date of birth, ID band Patient awake  General Assessment Comment:O2 transiently dropped during colonoscopy  Reviewed: Allergy & Precautions, NPO status , Patient's Chart, lab work & pertinent test results, reviewed documented beta blocker date and time   History of Anesthesia Complications (+) history of anesthetic complications  Airway Mallampati: II  TM Distance: >3 FB     Dental  (+) Caps   Pulmonary shortness of breath and with exertion, pneumonia, resolved, former smoker,    Pulmonary exam normal        Cardiovascular hypertension, Pt. on medications and Pt. on home beta blockers + CAD and + Peripheral Vascular Disease  Normal cardiovascular exam+ dysrhythmias Atrial Fibrillation      Neuro/Psych negative neurological ROS  negative psych ROS   GI/Hepatic Neg liver ROS, hiatal hernia, GERD  Medicated,Dysphagia Hx   Endo/Other  negative endocrine ROS  Renal/GU negative Renal ROS  negative genitourinary   Musculoskeletal  (+) Arthritis , Osteoarthritis,    Abdominal Normal abdominal exam  (+)   Peds negative pediatric ROS (+)  Hematology negative hematology ROS (+)   Anesthesia Other Findings   Reproductive/Obstetrics                             Anesthesia Physical Anesthesia Plan  ASA: III  Anesthesia Plan: MAC   Post-op Pain Management:    Induction: Intravenous  Airway Management Planned: Nasal Cannula  Additional Equipment:   Intra-op Plan:   Post-operative Plan:   Informed Consent: I have reviewed the patients History and Physical, chart, labs and discussed the procedure including the risks, benefits and alternatives for the proposed anesthesia with the patient or authorized representative who has indicated his/her understanding and acceptance.   Dental advisory given  Plan Discussed with: CRNA and  Surgeon  Anesthesia Plan Comments:         Anesthesia Quick Evaluation

## 2016-06-07 NOTE — Discharge Instructions (Signed)
Eye Surgery Discharge Instructions  Expect mild scratchy sensation or mild soreness. DO NOT RUB YOUR EYE!  The day of surgery:  Minimal physical activity, but bed rest is not required  No reading, computer work, or close hand work  No bending, lifting, or straining.  May watch TV  For 24 hours:  No driving, legal decisions, or alcoholic beverages  Safety precautions  Eat anything you prefer: It is better to start with liquids, then soup then solid foods.  _____ Eye patch should be worn until postoperative exam tomorrow.  ____ Solar shield eyeglasses should be worn for comfort in the sunlight/patch while sleeping  Resume all regular medications including aspirin or Coumadin if these were discontinued prior to surgery. You may shower, bathe, shave, or wash your hair. Tylenol may be taken for mild discomfort.  Call your doctor if you experience significant pain, nausea, or vomiting, fever > 101 or other signs of infection. [phone] or [phone] Specific instructions:  Follow-up Information    Tim Lair, MD .   Specialty:  Ophthalmology Why:  September 6 at 9:50am Contact information: [address] [place] [address] [phone]

## 2016-06-14 ENCOUNTER — Encounter: Payer: Self-pay | Admitting: Gastroenterology

## 2016-06-14 ENCOUNTER — Ambulatory Visit (INDEPENDENT_AMBULATORY_CARE_PROVIDER_SITE_OTHER): Payer: Medicare Other | Admitting: Gastroenterology

## 2016-06-14 ENCOUNTER — Other Ambulatory Visit: Payer: Self-pay

## 2016-06-14 VITALS — BP 119/62 | HR 71 | Temp 98.2°F | Ht 64.0 in | Wt 156.0 lb

## 2016-06-14 DIAGNOSIS — R0602 Shortness of breath: Secondary | ICD-10-CM | POA: Diagnosis not present

## 2016-06-14 DIAGNOSIS — I6523 Occlusion and stenosis of bilateral carotid arteries: Secondary | ICD-10-CM | POA: Diagnosis not present

## 2016-06-14 DIAGNOSIS — K219 Gastro-esophageal reflux disease without esophagitis: Secondary | ICD-10-CM

## 2016-06-14 MED ORDER — PANTOPRAZOLE SODIUM 40 MG PO TBEC: 40.0000 mg | DELAYED_RELEASE_TABLET | Freq: Two times a day (BID) | ORAL | 11 refills | Status: DC

## 2016-06-14 NOTE — Progress Notes (Signed)
Gastroenterology Consultation  Referring Provider:     Leone Haven, MD Primary Care Physician:  Tommi Rumps, MD Primary Gastroenterologist:  Dr. Allen Norris     Reason for Consultation:     GERD        HPI:   Isabel Hernandez is a 75 y.o. y/o female referred for consultation & management of GERD by Dr. Tommi Rumps, MD.  This patient comes today at the recommendation of her ENT doctor Dr. Tami Ribas  The patient reports that she has had heartburn and  Chest pain.  She also states that she was seen by her pulmonologist for shortness of breath after minimal exertion and states that she was told that acid may be causing her symptoms.  The patient does have a report of having an upper endoscopy in the past with dilation.  There is no report of any dysphagia at the present time.  She denies any black stools or bloody stools.  The patient was on pantoprazole and zantac but was switched to omeprazole.  She reports that the omeprazole makes her feel worse.   Past Medical History:  Diagnosis Date  . A-fib ([place])   . Arthritis   . Breast cancer ([place]) 2002   RT LUMPECTOMY  . Bronchitis   . Carotid artery narrowings   . 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 Colon polyps   . Complication of anesthesia    shaking, drop O2 sats  . Coronary artery disease   . Diverticulosis   . Environmental allergies   . GERD (gastroesophageal reflux disease)   . History of hiatal hernia   . Hyperlipidemia   . Hypertension   . IBS (irritable bowel syndrome)   . Mitral valve disorder   . Shortness of breath dyspnea   . Syncope and collapse   . UTI (lower urinary tract infection)     Past Surgical History:  Procedure Laterality Date  . APPENDECTOMY    . BREAST BIOPSY Right 2002   POS  . BREAST CYST ASPIRATION Right   . BREAST EXCISIONAL BIOPSY Left 1997   NEG  . CATARACT EXTRACTION W/PHACO Right 06/07/2016   Procedure: CATARACT EXTRACTION PHACO AND INTRAOCULAR LENS PLACEMENT (IOC);  Surgeon:  Birder Robson, MD;  Location: [facility] ORS;  Service: Ophthalmology;  Laterality: Right;  [place] 00:51 AP% 19.6 CDE 10.09 Fluid pack lot # [id number]  . CESAREAN SECTION    . TONSILLECTOMY    . TOTAL KNEE ARTHROPLASTY Left     Prior to Admission medications   Medication Sig Start Date End Date Taking? Authorizing Provider  amLODipine (NORVASC) 5 MG tablet Take 1 tablet (5 mg total) by mouth daily. 03/08/16  Yes Wellington Hampshire, MD  aspirin 81 MG tablet Take 81 mg by mouth daily.   Yes Historical Provider, MD  atorvastatin (LIPITOR) 20 MG tablet Take 20 mg by mouth daily.  11/13/15  Yes Historical Provider, MD  BROMSITE 0.075 % SOLN  05/31/16  Yes Historical Provider, MD  Calcium Carbonate (CALTRATE 600 PO) Take 1 tablet by mouth 2 (two) times daily.   Yes Historical Provider, MD  clobetasol ointment (TEMOVATE) [id number] % Apply 1 application topically 2 (two) times daily as needed.   Yes Historical Provider, MD  DUREZOL 0.05 % EMUL  05/31/16  Yes Historical Provider, MD  fenofibrate (TRICOR) 145 MG tablet Take 145 mg by mouth daily.  11/13/15  Yes Historical Provider, MD  hydrochlorothiazide (MICROZIDE) 12.5 MG capsule Take 12.5 mg by  mouth daily.  11/20/15  Yes Historical Provider, MD  losartan (COZAAR) 50 MG tablet Take 50 mg by mouth daily.  01/21/16  Yes Historical Provider, MD  meloxicam (MOBIC) 7.5 MG tablet Take 7.5 mg by mouth daily as needed for pain.   Yes Historical Provider, MD  metoprolol tartrate (LOPRESSOR) 25 MG tablet Take 25 mg by mouth 2 (two) times daily.  11/13/15  Yes Historical Provider, MD  Multiple Vitamin (MULTIVITAMIN WITH MINERALS) TABS tablet Take 1 tablet by mouth daily.   Yes Historical Provider, MD  Omega-3 Fatty Acids (FISH OIL) 1000 MG CAPS Take by mouth.   Yes Historical Provider, MD  omeprazole (PRILOSEC) 40 MG capsule Take 40 mg by mouth daily.   Yes Historical Provider, MD  albuterol (PROVENTIL HFA;VENTOLIN HFA) 108 (90 Base) MCG/ACT inhaler Inhale 2 puffs into the lungs  every 6 (six) hours as needed for wheezing or shortness of breath. Patient not taking: Reported on 06/07/2016 05/05/16   Laverle Hobby, MD  loratadine (CLARITIN) 10 MG tablet Take 1 tablet (10 mg total) by mouth daily. Patient not taking: Reported on 06/14/2016 02/09/16   Leone Haven, MD  pantoprazole (PROTONIX) 40 MG tablet Take 1 tablet (40 mg total) by mouth 2 (two) times daily. 06/14/16   Lucilla Lame, MD  umeclidinium-vilanterol (ANORO ELLIPTA) 62.5-25 MCG/INH AEPB Inhale 1 puff into the lungs daily. Patient not taking: Reported on 06/14/2016 03/30/16   Laverle Hobby, MD    Family History  Problem Relation Age of Onset  . Lung cancer Father   . Prostate cancer Father   . Hyperlipidemia Brother   . Heart disease Mother   . Lung cancer Sister   . Hypertension Brother   . Kidney disease      Maternal grandparent  . Diabetes Brother   . Breast cancer Neg Hx      Social History  Substance Use Topics  . Smoking status: Former Smoker    Years: 10.00    Types: Cigarettes  . Smokeless tobacco: Never Used  . Alcohol use 0.0 oz/week     Comment: couple drinks/week    Allergies as of 06/14/2016 - Review Complete 06/14/2016  Allergen Reaction Noted  . Biaxin [clarithromycin]  02/09/2016  . Latex  02/09/2016  . Nickel  06/07/2016  . Nitroglycerin  02/09/2016  . Oxycodone  02/09/2016  . Penicillins  02/09/2016  . Tetracyclines & related  02/09/2016  . Tizanidine  02/09/2016    Review of Systems:    All systems reviewed and negative except where noted in HPI.   Physical Exam:  BP 119/62   Pulse 71   Temp 98.2 F (36.8 C) (Oral)   Ht 5\' 4"  (1.626 m)   Wt 156 lb (70.8 kg)   BMI 26.78 kg/m  No LMP recorded. Patient is postmenopausal. Psych:  Alert and cooperative. Normal mood and affect. General:   Alert,  Well-developed, \well-nourished, pleasant and cooperative in NAD Head:  Normocephalic and atraumatic. Eyes:  Sclera clear, no icterus.   Conjunctiva  pink. Ears:  Normal auditory acuity. Nose:  No deformity, discharge, or lesions. Mouth:  No deformity or lesions,oropharynx pink & moist. Neck:  Supple; no masses or thyromegaly. Lungs:  Respirations even and unlabored.  Clear throughout to auscultation.   No wheezes, crackles, or rhonchi. No acute distress. Heart:  Regular rate and rhythm; no murmurs, clicks, rubs, or gallops. Abdomen:  Normal bowel sounds.  No bruits.  Soft, non-tender and non-distended without masses, hepatosplenomegaly or hernias noted.  No guarding or rebound tenderness.  Negative Carnett sign.   Rectal:  Deferred.  Msk:  Symmetrical without gross deformities.  Good, equal movement & strength bilaterally. Pulses:  Normal pulses noted. Extremities:  No clubbing or edema.  No cyanosis. Neurologic:  Alert and oriented x3;  grossly normal neurologically. Skin:  Intact without significant lesions or rashes.  No jaundice. Lymph Nodes:  No significant cervical adenopathy. Psych:  Alert and cooperative. Normal mood and affect.  Imaging Studies: Dg Bone Density  Result Date: 05/23/2016 EXAM: DUAL X-RAY ABSORPTIOMETRY (DXA) FOR BONE MINERAL DENSITY IMPRESSION: Dear Dr. Kenton Kingfisher, Your patient Isabel Hernandez a BMD test on 05/23/2016 using the [place] (analysis version: 14.10) manufactured by [employer]. The following summarizes the results of our evaluation. PATIENT BIOGRAPHICAL: Name: Isabel Hernandez, Isabel Hernandez Patient ID: [id number] Birth Date: 26-Nov-1940 Height: 64.0 in. Gender: Female Exam Date: 05/23/2016 Weight: 156.0 lbs. Indications: Advanced Age, Caucasian, Height Loss, History of Breast Cancer, Postmenopausal Fractures: Treatments: ASPRIN 81 MG, CALCIUM VIT D, claritin, lipitor, Multi-Vitamin with calcium, omeprazole ASSESSMENT: The BMD measured at Femur Neck Left is 0.826 g/cm2 with a T-score of -1.5. This patient is considered osteopenic according to [facility]) criteria. Site Region Measured  Measured WHO Young Adult BMD Date       Age      Classification T-score AP Spine L1-L4 4/23/[age] Normal 0.9 1.302 g/cm2 DualFemur Neck Left 05/23/2016 [age] Osteopenia -1.5 0.826 g/cm2 World Health Organization [facility]) criteria for post-menopausal, Caucasian Women: Normal:       T-score at or above -1 SD Osteopenia:   T-score between -1 and -2.5 SD Osteoporosis: T-score at or below -2.5 SD RECOMMENDATIONS: [place] recommends that FDA-approved medical therapies be considered in postmenopausal women and men age 10 or older with a: 1. Hip or vertebral (clinical or morphometric) fracture. 2. T-score of < -2.5 at the spine or hip. 3. Ten-year fracture probability by FRAX of 3% or greater for hip fracture or 20% or greater for major osteoporotic fracture. All treatment decisions require clinical judgment and consideration of individual patient factors, including patient preferences, co-morbidities, previous drug use, risk factors not captured in the FRAX model (e.g. falls, vitamin D deficiency, increased bone turnover, interval significant decline in bone density) and possible under - or over-estimation of fracture risk by FRAX. All patients should ensure an adequate intake of dietary calcium (1200 mg/d) and vitamin D (800 IU daily) unless contraindicated. FOLLOW-UP: People with diagnosed cases of osteoporosis or at high risk for fracture should have regular bone mineral density tests. For patients eligible for Medicare, routine testing is allowed once every 2 years. The testing frequency can be increased to one year for patients who have rapidly progressing disease, those who are receiving or discontinuing medical therapy to restore bone mass, or have additional risk factors. I have reviewed this report, and agree with the above findings. [facility] Radiology Dear Dr. Kenton Kingfisher, Your patient Isabel Hernandez Hernandez a FRAX assessment on 05/23/2016 using the [place] (analysis version:  14.10) manufactured by [employer]. The following summarizes the results of our evaluation. PATIENT BIOGRAPHICAL: Name: Isabel Hernandez, Isabel Hernandez Patient ID: [id number] Birth Date: 1941/04/23 Height:    64.0 in. Gender:     [age]    [age]       Weight:    156.0 lbs. Ethnicity:  White  Exam Date: 05/23/2016 FRAX* RESULTS:  (version: 3.5) 10-year Probability of Fracture1 Major Osteoporotic Fracture2 Hip Fracture 12.1% 2.6% Population: [place] (Caucasian) Risk Factors: None Based on Femur (Left) Neck BMD 1 -The 10-year probability of fracture may be lower than reported if the patient has received treatment. 2 -Major Osteoporotic Fracture: Clinical Spine, Forearm, Hip or Shoulder *FRAX is a Materials engineer of the [employer] for Metabolic Bone Disease, a [place] (WHO) [facility]. ASSESSMENT: The probability of a major osteoporotic fracture is 12.1% within the next ten years. The probability of a hip fracture is 2.6% within the next ten years. . Electronically Signed   By: Lowella Grip III M.D.   On: 05/23/2016 14:36    Assessment and Plan:   Isabel Hernandez is a 75 y.o. y/o female who reports symptoms of chest pain that sound like esophageal spasms.  The patient has also had heartburn that is not well controlled by her present medication.  She has also been told that her shortness of breath may be related to her reflux. The patient will be switched to a trial of Pantoprazole to be taken twice a day.  This will be done to see if suppressing her acid completely injures any of her symptoms. The patient has been told that if none of her symptoms change with the acid suppression that another cause for her shortness of breath should be sought. The patient has been explained the plan and agrees with it.   Note: This dictation was prepared with Dragon dictation along with smaller phrase technology. Any transcriptional errors that result  from this process are unintentional.

## 2016-06-22 DIAGNOSIS — H2512 Age-related nuclear cataract, left eye: Secondary | ICD-10-CM | POA: Diagnosis not present

## 2016-06-23 ENCOUNTER — Encounter: Payer: Self-pay | Admitting: *Deleted

## 2016-06-28 DIAGNOSIS — S6991XA Unspecified injury of right wrist, hand and finger(s), initial encounter: Secondary | ICD-10-CM | POA: Diagnosis not present

## 2016-06-28 DIAGNOSIS — M79641 Pain in right hand: Secondary | ICD-10-CM | POA: Diagnosis not present

## 2016-06-28 DIAGNOSIS — M79644 Pain in right finger(s): Secondary | ICD-10-CM | POA: Diagnosis not present

## 2016-07-05 ENCOUNTER — Ambulatory Visit: Payer: Medicare Other | Admitting: Certified Registered Nurse Anesthetist

## 2016-07-05 ENCOUNTER — Ambulatory Visit
Admission: RE | Admit: 2016-07-05 | Discharge: 2016-07-05 | Disposition: A | Discharge status: Home / Self Care | Payer: Medicare Other | Source: Ambulatory Visit | Attending: Ophthalmology | Admitting: Ophthalmology

## 2016-07-05 ENCOUNTER — Encounter: Payer: Self-pay | Admitting: Anesthesiology

## 2016-07-05 ENCOUNTER — Encounter
Admission: RE | Disposition: A | Discharge status: Home / Self Care | Payer: Self-pay | Source: Ambulatory Visit | Attending: Ophthalmology

## 2016-07-05 DIAGNOSIS — K449 Diaphragmatic hernia without obstruction or gangrene: Secondary | ICD-10-CM | POA: Diagnosis not present

## 2016-07-05 DIAGNOSIS — I10 Essential (primary) hypertension: Secondary | ICD-10-CM | POA: Diagnosis not present

## 2016-07-05 DIAGNOSIS — K219 Gastro-esophageal reflux disease without esophagitis: Secondary | ICD-10-CM | POA: Diagnosis not present

## 2016-07-05 DIAGNOSIS — I739 Peripheral vascular disease, unspecified: Secondary | ICD-10-CM | POA: Insufficient documentation

## 2016-07-05 DIAGNOSIS — I2510 Atherosclerotic heart disease of native coronary artery without angina pectoris: Secondary | ICD-10-CM | POA: Insufficient documentation

## 2016-07-05 DIAGNOSIS — Z87891 Personal history of nicotine dependence: Secondary | ICD-10-CM | POA: Insufficient documentation

## 2016-07-05 DIAGNOSIS — E7800 Pure hypercholesterolemia, unspecified: Secondary | ICD-10-CM | POA: Insufficient documentation

## 2016-07-05 DIAGNOSIS — I4891 Unspecified atrial fibrillation: Secondary | ICD-10-CM | POA: Diagnosis not present

## 2016-07-05 DIAGNOSIS — H2512 Age-related nuclear cataract, left eye: Secondary | ICD-10-CM | POA: Insufficient documentation

## 2016-07-05 DIAGNOSIS — Z79899 Other long term (current) drug therapy: Secondary | ICD-10-CM | POA: Insufficient documentation

## 2016-07-05 HISTORY — PX: CATARACT EXTRACTION W/PHACO: SHX586

## 2016-07-05 HISTORY — DX: Cardiac arrhythmia, unspecified: I49.9

## 2016-07-05 SURGERY — PHACOEMULSIFICATION, CATARACT, WITH IOL INSERTION
Anesthesia: Monitor Anesthesia Care | Site: Eye | Laterality: Left | Wound class: Clean

## 2016-07-05 MED ORDER — ACETYLCHOLINE CHLORIDE 20 MG IO SOLR
INTRAOCULAR | Status: AC
  Filled 2016-07-05: qty 1

## 2016-07-05 MED ORDER — TETRACAINE HCL 0.5 % OP SOLN
OPHTHALMIC | Status: AC
  Administered 2016-07-05: 1 [drp] via OPHTHALMIC
  Filled 2016-07-05: qty 2

## 2016-07-05 MED ORDER — MOXIFLOXACIN HCL 0.5 % OP SOLN
OPHTHALMIC | Status: DC | PRN
  Administered 2016-07-05: 1 [drp] via OPHTHALMIC

## 2016-07-05 MED ORDER — NA CHONDROIT SULF-NA HYALURON 40-17 MG/ML IO SOLN
INTRAOCULAR | Status: DC | PRN
  Administered 2016-07-05: 1 mL via INTRAOCULAR

## 2016-07-05 MED ORDER — POVIDONE-IODINE 5 % OP SOLN
OPHTHALMIC | Status: AC
  Administered 2016-07-05: 1 via OPHTHALMIC
  Filled 2016-07-05: qty 30

## 2016-07-05 MED ORDER — POVIDONE-IODINE 5 % OP SOLN
1.0000 "application " | Freq: Once | OPHTHALMIC | Status: AC
  Administered 2016-07-05: 1 via OPHTHALMIC

## 2016-07-05 MED ORDER — MIDAZOLAM HCL 2 MG/2ML IJ SOLN
INTRAMUSCULAR | Status: DC | PRN
  Administered 2016-07-05: 1 mg via INTRAVENOUS

## 2016-07-05 MED ORDER — ARMC OPHTHALMIC DILATING DROPS
1.0000 "application " | OPHTHALMIC | Status: AC
  Administered 2016-07-05 (×3): 1 via OPHTHALMIC
  Filled 2016-07-05: qty 0.4

## 2016-07-05 MED ORDER — LIDOCAINE HCL 3.5 % OP GEL
1.0000 "application " | Freq: Once | OPHTHALMIC | Status: AC
  Administered 2016-07-05: 1 via OPHTHALMIC

## 2016-07-05 MED ORDER — ACETYLCHOLINE CHLORIDE 20 MG IO SOLR
INTRAOCULAR | Status: DC | PRN
  Administered 2016-07-05: 10 mg via INTRAOCULAR

## 2016-07-05 MED ORDER — EPINEPHRINE HCL 1 MG/ML IJ SOLN
INTRAMUSCULAR | Status: AC
  Filled 2016-07-05: qty 1

## 2016-07-05 MED ORDER — CEFUROXIME OPHTHALMIC INJECTION 1 MG/0.1 ML
INJECTION | OPHTHALMIC | Status: AC
  Filled 2016-07-05: qty 0.1

## 2016-07-05 MED ORDER — TETRACAINE HCL 0.5 % OP SOLN
1.0000 [drp] | Freq: Once | OPHTHALMIC | Status: AC
  Administered 2016-07-05: 1 [drp] via OPHTHALMIC

## 2016-07-05 MED ORDER — MOXIFLOXACIN HCL 0.5 % OP SOLN
1.0000 [drp] | OPHTHALMIC | Status: AC
  Administered 2016-07-05 (×3): 1 [drp] via OPHTHALMIC

## 2016-07-05 MED ORDER — BSS IO SOLN
INTRAOCULAR | Status: DC | PRN
  Administered 2016-07-05: 1 mL via OPHTHALMIC

## 2016-07-05 MED ORDER — LIDOCAINE HCL 3.5 % OP GEL
OPHTHALMIC | Status: AC
  Administered 2016-07-05: 1 via OPHTHALMIC
  Filled 2016-07-05: qty 1

## 2016-07-05 MED ORDER — SODIUM CHLORIDE 0.9 % IV SOLN
INTRAVENOUS | Status: DC
  Administered 2016-07-05: 12:00:00 via INTRAVENOUS

## 2016-07-05 MED ORDER — MOXIFLOXACIN HCL 0.5 % OP SOLN
OPHTHALMIC | Status: AC
  Administered 2016-07-05: 1 [drp] via OPHTHALMIC
  Filled 2016-07-05: qty 3

## 2016-07-05 MED ORDER — CARBACHOL 0.01 % IO SOLN: INTRAOCULAR | Status: DC | PRN

## 2016-07-05 SURGICAL SUPPLY — 21 items
CANNULA ANT/CHMB 27GA (MISCELLANEOUS) ×3 IMPLANT
CUP MEDICINE 2OZ PLAST GRAD ST (MISCELLANEOUS) ×3 IMPLANT
GLOVE BIO SURGEON STRL SZ8 (GLOVE) ×3 IMPLANT
GLOVE BIOGEL M 6.5 STRL (GLOVE) ×3 IMPLANT
GLOVE SURG LX 8.0 MICRO (GLOVE) ×2
GLOVE SURG LX STRL 8.0 MICRO (GLOVE) ×1 IMPLANT
GOWN STRL REUS W/ TWL LRG LVL3 (GOWN DISPOSABLE) ×2 IMPLANT
GOWN STRL REUS W/TWL LRG LVL3 (GOWN DISPOSABLE) ×4
LENS IOL TECNIS ITEC 26.5 (Intraocular Lens) ×3 IMPLANT
PACK CATARACT (MISCELLANEOUS) ×3 IMPLANT
PACK CATARACT BRASINGTON LX (MISCELLANEOUS) ×3 IMPLANT
PACK EYE AFTER SURG (MISCELLANEOUS) ×3 IMPLANT
SOL BSS BAG (MISCELLANEOUS) ×3
SOL PREP PVP 2OZ (MISCELLANEOUS) ×3
SOLUTION BSS BAG (MISCELLANEOUS) ×1 IMPLANT
SOLUTION PREP PVP 2OZ (MISCELLANEOUS) ×1 IMPLANT
SYR 3ML LL SCALE MARK (SYRINGE) ×3 IMPLANT
SYR 5ML LL (SYRINGE) ×3 IMPLANT
SYR TB 1ML 27GX1/2 LL (SYRINGE) ×3 IMPLANT
WATER STERILE IRR 250ML POUR (IV SOLUTION) ×3 IMPLANT
WIPE NON LINTING 3.25X3.25 (MISCELLANEOUS) ×3 IMPLANT

## 2016-07-05 NOTE — Anesthesia Procedure Notes (Signed)
Procedure Name: MAC Performed by: Tyasia Packard Pre-anesthesia Checklist: Patient identified, Emergency Drugs available, Suction available, Patient being monitored and Timeout performed Oxygen Delivery Method: Nasal cannula       

## 2016-07-05 NOTE — Op Note (Signed)
PREOPERATIVE DIAGNOSIS:  Nuclear sclerotic cataract of the left eye.   POSTOPERATIVE DIAGNOSIS:  Nuclear sclerotic cataract of the left eye.   OPERATIVE PROCEDURE: Procedure(s): CATARACT EXTRACTION PHACO AND INTRAOCULAR LENS PLACEMENT (IOC)   SURGEON:  Birder Robson, MD.   ANESTHESIA:  Anesthesiologist: Martha Clan, MD CRNA: Demetrius Charity, CRNA  1.      Managed anesthesia care. 2.      Topical tetracaine drops followed by 2% Xylocaine jelly applied in the preoperative holding area.   COMPLICATIONS:  None.   TECHNIQUE:   Stop and chop   DESCRIPTION OF PROCEDURE:  The patient was examined and consented in the preoperative holding area where the aforementioned topical anesthesia was applied to the left eye and then brought back to the Operating Room where the left eye was prepped and draped in the usual sterile ophthalmic fashion and a lid speculum was placed. A paracentesis was created with the side port blade and the anterior chamber was filled with viscoelastic. A near clear corneal incision was performed with the steel keratome. A continuous curvilinear capsulorrhexis was performed with a cystotome followed by the capsulorrhexis forceps. Hydrodissection and hydrodelineation were carried out with BSS on a blunt cannula. The lens was removed in a stop and chop  technique and the remaining cortical material was removed with the irrigation-aspiration handpiece. The capsular bag was inflated with viscoelastic and the Technis [id number] lens was placed in the capsular bag without complication. The remaining viscoelastic was removed from the eye with the irrigation-aspiration handpiece. The wounds were hydrated. The anterior chamber was flushed with Miostat and the eye was inflated to physiologic pressure. 0.2 mL of Vigamox diluted three/one with BSS was placed in the anterior chamber. The wounds were found to be water tight. The eye was dressed with Vigamox. The patient was given protective glasses  to wear throughout the day and a shield with which to sleep tonight. The patient was also given drops with which to begin a drop regimen today and will follow-up with me in one day.  Implant Name Type Inv. Item Serial No. Manufacturer Lot No. LRB No. Used  LENS IOL DIOP 26.5 - [id number] Intraocular Lens LENS IOL DIOP 26.5 [id number]   Left 1    Procedure(s) with comments: CATARACT EXTRACTION PHACO AND INTRAOCULAR LENS PLACEMENT (IOC) (Left) - [place] 00:38 AP% 24.9 CDE 9.60 Fluid Pack lot # [id number]  Electronically signed: [place] 07/05/2016 12:55 PM

## 2016-07-05 NOTE — Anesthesia Postprocedure Evaluation (Signed)
Anesthesia Post Note  Patient: Isabel Hernandez  Procedure(s) Performed: Procedure(s) (LRB): CATARACT EXTRACTION PHACO AND INTRAOCULAR LENS PLACEMENT (IOC) (Left)  Patient location during evaluation: PACU Anesthesia Type: MAC Level of consciousness: awake and alert and oriented Pain management: satisfactory to patient Vital Signs Assessment: post-procedure vital signs reviewed and stable Respiratory status: respiratory function stable Cardiovascular status: stable Anesthetic complications: no    Last Vitals:  Vitals:   07/05/16 1152  BP: 136/67  Pulse: 68  Resp: 16  Temp: 36.5 C    Last Pain:  Vitals:   07/05/16 1152  TempSrc: Oral                 Blima Singer

## 2016-07-05 NOTE — Discharge Instructions (Signed)
Eye Surgery Discharge Instructions  Expect mild scratchy sensation or mild soreness. DO NOT RUB YOUR EYE!  The day of surgery:  Minimal physical activity, but bed rest is not required  No reading, computer work, or close hand work  No bending, lifting, or straining.  May watch TV  For 24 hours:  No driving, legal decisions, or alcoholic beverages  Safety precautions  Eat anything you prefer: It is better to start with liquids, then soup then solid foods.  _____ Eye patch should be worn until postoperative exam tomorrow.  ____ Solar shield eyeglasses should be worn for comfort in the sunlight/patch while sleeping  Resume all regular medications including aspirin or Coumadin if these were discontinued prior to surgery. You may shower, bathe, shave, or wash your hair. Tylenol may be taken for mild discomfort.  Call your doctor if you experience significant pain, nausea, or vomiting, fever > 101 or other signs of infection. [phone] or [phone] Specific instructions:  Follow-up Information    Tim Lair, MD .   Specialty:  Ophthalmology Why:  October 4 at 10:05am Contact information: [address] [place] [address] [phone]

## 2016-07-05 NOTE — H&P (Signed)
All labs reviewed. Abnormal studies sent to patients PCP when indicated.  Previous H&P reviewed, patient examined, there are NO CHANGES.  Isabel Hernandez LOUIS10/3/201712:29 PM

## 2016-07-05 NOTE — Transfer of Care (Signed)
Immediate Anesthesia Transfer of Care Note  Patient: Isabel Hernandez  Procedure(s) Performed: Procedure(s) with comments: CATARACT EXTRACTION PHACO AND INTRAOCULAR LENS PLACEMENT (IOC) (Left) - [place] 00:38 AP% 24.9 CDE 9.60 Fluid Pack lot # [id number]  Patient Location: PACU  Anesthesia Type:MAC  Level of Consciousness: awake, alert  and oriented  Airway & Oxygen Therapy: Patient Spontanous Breathing  Post-op Assessment: Report given to RN and Post -op Vital signs reviewed and stable  Post vital signs: Reviewed and stable  Last Vitals:  Vitals:   07/05/16 1152  BP: 136/67  Pulse: 68  Resp: 16  Temp: 36.5 C    Last Pain:  Vitals:   07/05/16 1152  TempSrc: Oral         Complications: No apparent anesthesia complications

## 2016-07-05 NOTE — Anesthesia Preprocedure Evaluation (Signed)
Anesthesia Evaluation  Patient identified by MRN, date of birth, ID band Patient awake  General Assessment Comment:O2 transiently dropped during colonoscopy  Reviewed: Allergy & Precautions, NPO status , Patient's Chart, lab work & pertinent test results, reviewed documented beta blocker date and time   History of Anesthesia Complications (+) history of anesthetic complications  Airway Mallampati: II  TM Distance: >3 FB     Dental  (+) Caps   Pulmonary shortness of breath and with exertion, pneumonia, resolved, former smoker,    Pulmonary exam normal        Cardiovascular hypertension, Pt. on medications and Pt. on home beta blockers + CAD and + Peripheral Vascular Disease  Normal cardiovascular exam+ dysrhythmias Atrial Fibrillation      Neuro/Psych negative neurological ROS  negative psych ROS   GI/Hepatic Neg liver ROS, hiatal hernia, GERD  Medicated,Dysphagia Hx   Endo/Other  negative endocrine ROS  Renal/GU negative Renal ROS  negative genitourinary   Musculoskeletal  (+) Arthritis , Osteoarthritis,    Abdominal Normal abdominal exam  (+)   Peds negative pediatric ROS (+)  Hematology negative hematology ROS (+)   Anesthesia Other Findings   Reproductive/Obstetrics                             Anesthesia Physical  Anesthesia Plan  ASA: III  Anesthesia Plan: MAC   Post-op Pain Management:    Induction: Intravenous  Airway Management Planned: Nasal Cannula  Additional Equipment:   Intra-op Plan:   Post-operative Plan:   Informed Consent: I have reviewed the patients History and Physical, chart, labs and discussed the procedure including the risks, benefits and alternatives for the proposed anesthesia with the patient or authorized representative who has indicated his/her understanding and acceptance.   Dental advisory given  Plan Discussed with: CRNA and  Surgeon  Anesthesia Plan Comments:         Anesthesia Quick Evaluation

## 2016-07-07 DIAGNOSIS — S63641A Sprain of metacarpophalangeal joint of right thumb, initial encounter: Secondary | ICD-10-CM | POA: Diagnosis not present

## 2016-07-07 DIAGNOSIS — M1811 Unilateral primary osteoarthritis of first carpometacarpal joint, right hand: Secondary | ICD-10-CM | POA: Diagnosis not present

## 2016-07-14 DIAGNOSIS — L300 Nummular dermatitis: Secondary | ICD-10-CM | POA: Diagnosis not present

## 2016-08-19 ENCOUNTER — Ambulatory Visit (INDEPENDENT_AMBULATORY_CARE_PROVIDER_SITE_OTHER): Payer: Medicare Other

## 2016-08-19 ENCOUNTER — Ambulatory Visit (INDEPENDENT_AMBULATORY_CARE_PROVIDER_SITE_OTHER): Payer: Medicare Other | Admitting: Family Medicine

## 2016-08-19 ENCOUNTER — Encounter: Payer: Self-pay | Admitting: Family Medicine

## 2016-08-19 VITALS — BP 114/64 | HR 79 | Temp 98.1°F | Wt 161.2 lb

## 2016-08-19 DIAGNOSIS — H04209 Unspecified epiphora, unspecified lacrimal gland: Secondary | ICD-10-CM | POA: Insufficient documentation

## 2016-08-19 DIAGNOSIS — I6523 Occlusion and stenosis of bilateral carotid arteries: Secondary | ICD-10-CM

## 2016-08-19 DIAGNOSIS — M25551 Pain in right hip: Secondary | ICD-10-CM | POA: Insufficient documentation

## 2016-08-19 DIAGNOSIS — H04203 Unspecified epiphora, bilateral lacrimal glands: Secondary | ICD-10-CM

## 2016-08-19 DIAGNOSIS — R0600 Dyspnea, unspecified: Secondary | ICD-10-CM

## 2016-08-19 DIAGNOSIS — Z23 Encounter for immunization: Secondary | ICD-10-CM | POA: Diagnosis not present

## 2016-08-19 LAB — CBC
HCT: 36.7 % (ref 36.0–46.0)
HEMOGLOBIN: 12.5 g/dL (ref 12.0–15.0)
MCHC: 34.1 g/dL (ref 30.0–36.0)
MCV: 97.1 fl (ref 78.0–100.0)
PLATELETS: 141 10*3/uL — AB (ref 150.0–400.0)
RBC: 3.78 Mil/uL — AB (ref 3.87–5.11)
RDW: 13.2 % (ref 11.5–15.5)
WBC: 5.8 10*3/uL (ref 4.0–10.5)

## 2016-08-19 NOTE — Patient Instructions (Signed)
Nice to see you. Refer you to an allergist. Please keep your follow-up appointment with the pulmonologist. We'll obtain an x-ray of the right hip as well. If you develop worsening shortness of breath, cough productive of blood, chest pain, or any new or changing symptoms please seek medical attention.

## 2016-08-19 NOTE — Assessment & Plan Note (Signed)
Unknown cause of this issue at this time. Doesn't sound like it's related to aspiration given minimal improvement with Protonix. Discussed checking a CBC to ensure that she is not anemic. She will follow-up with cardiology in several weeks for further evaluation. We will get her set up with her pulmonologist for further evaluation as well. If worsens she'll be evaluated.

## 2016-08-19 NOTE — Progress Notes (Signed)
Pre visit review using our clinic review tool, if applicable. No additional management support is needed unless otherwise documented below in the visit note. 

## 2016-08-19 NOTE — Progress Notes (Signed)
Tommi Rumps, MD Phone: [phone]  Isabel Hernandez is a 75 y.o. female who presents today for follow-up.  Patient notes eye watering that has been chronic and bothersome. She thought it would get better after having her cataracts taken out. She saw ophthalmology for this and notes they did not comment on any cause. She tried Claritin. She's tried lubricating eyedrops. Nothing has helped. She notes some mild itch with this. She would like to see an allergist for this.  Pneumonitis: Patient continues to have symptoms with dyspnea on exertion. She notes it is improved. Does have a little trouble shopping though not much trouble at other times. No chest pain. No cough. Notes it typically resolves with rest. She had a stress test earlier this year that did not reveal any abnormalities. She's had echoes in the past that she reports were normal. This was felt by pulmonology to be related to aspiration though she saw GI and they switched her to Protonix and she has not noted a significant improvement with this. She has no orthopnea or PND. No lower extremity swelling.  Right hip pain: Patient notes this is chronic. This has been going on at least the last year if not 2 years. Had x-ray 2 years ago and they told her that she did not need surgery. Notes it occurs intermittently when she walks. It is a dull ache. No injury. Takes Aleve and this is somewhat beneficial.  PMH: Former smoker   ROS see history of present illness  Objective  Physical Exam Vitals:   08/19/16 1358  BP: 114/64  Pulse: 79  Temp: 98.1 F (36.7 C)    BP Readings from Last 3 Encounters:  08/19/16 114/64  07/05/16 (!) 121/59  06/14/16 119/62   Wt Readings from Last 3 Encounters:  08/19/16 161 lb 3.2 oz (73.1 kg)  06/23/16 157 lb (71.2 kg)  06/14/16 156 lb (70.8 kg)    Physical Exam  Constitutional: No distress.  HENT:  Mouth/Throat: Oropharynx is clear and moist. No oropharyngeal exudate.  Eyes: Conjunctivae are  normal. Pupils are equal, round, and reactive to light.  clear discharge mild in nature  Cardiovascular: Normal rate, regular rhythm and normal heart sounds.   Pulmonary/Chest: Effort normal and breath sounds normal.  Musculoskeletal: She exhibits no edema.  No tenderness of bilateral hips, no discomfort on internal or external range of motion bilateral hips, full range of motion internally and externally bilateral hips  Neurological: She is alert. Gait normal.  Skin: Skin is warm and dry. She is not diaphoretic.     Assessment/Plan: Please see individual problem list.  Pneumonitis Unknown cause of this issue at this time. Doesn't sound like it's related to aspiration given minimal improvement with Protonix. Discussed checking a CBC to ensure that she is not anemic. She will follow-up with cardiology in several weeks for further evaluation. We will get her set up with her pulmonologist for further evaluation as well. If worsens she'll be evaluated.  Watering of eye Mild clear discharge. Could be allergic in nature. Has seen ophthalmology. We'll refer to an allergist.  Right hip pain Benign exam. Could be osteoarthritis. We will obtain an x-ray of her right hip. Depending on severity and findings could consider physical therapy versus orthopedic referral.   Orders Placed This Encounter  Procedures  . DG HIP UNILAT WITH PELVIS 2-3 VIEWS RIGHT    Standing Status:   Future    Number of Occurrences:   1    Standing Expiration Date:  10/19/2017    Order Specific Question:   Reason for Exam (SYMPTOM  OR DIAGNOSIS REQUIRED)    Answer:   right hip pain for several years, worsened recently, no injury    Order Specific Question:   Preferred imaging location?    Answer:   [facility]  . Flu vaccine HIGH DOSE PF  . CBC  . Ambulatory referral to Allergy    Referral Priority:   Routine    Referral Type:   Allergy Testing    Referral Reason:   Specialty Services Required     Requested Specialty:   Allergy    Number of Visits Requested:   1    Tommi Rumps, MD [place]

## 2016-08-19 NOTE — Assessment & Plan Note (Signed)
Benign exam. Could be osteoarthritis. We will obtain an x-ray of her right hip. Depending on severity and findings could consider physical therapy versus orthopedic referral.

## 2016-08-19 NOTE — Assessment & Plan Note (Signed)
Mild clear discharge. Could be allergic in nature. Has seen ophthalmology. We'll refer to an allergist.

## 2016-08-22 ENCOUNTER — Encounter: Payer: Self-pay | Admitting: Family Medicine

## 2016-08-22 NOTE — Progress Notes (Signed)
Scheduled 12/14 at 9:00 am with Dr. Rogelia Mire

## 2016-08-23 ENCOUNTER — Other Ambulatory Visit: Payer: Self-pay | Admitting: Family Medicine

## 2016-08-23 DIAGNOSIS — D696 Thrombocytopenia, unspecified: Secondary | ICD-10-CM

## 2016-09-05 ENCOUNTER — Ambulatory Visit (INDEPENDENT_AMBULATORY_CARE_PROVIDER_SITE_OTHER): Payer: Medicare Other | Admitting: Cardiovascular Disease

## 2016-09-05 ENCOUNTER — Encounter: Payer: Self-pay | Admitting: Cardiovascular Disease

## 2016-09-05 VITALS — BP 112/74 | HR 69 | Ht 64.0 in | Wt 160.5 lb

## 2016-09-05 DIAGNOSIS — I10 Essential (primary) hypertension: Secondary | ICD-10-CM | POA: Diagnosis not present

## 2016-09-05 DIAGNOSIS — R0600 Dyspnea, unspecified: Secondary | ICD-10-CM

## 2016-09-05 DIAGNOSIS — E785 Hyperlipidemia, unspecified: Secondary | ICD-10-CM

## 2016-09-05 DIAGNOSIS — I6523 Occlusion and stenosis of bilateral carotid arteries: Secondary | ICD-10-CM

## 2016-09-05 NOTE — Patient Instructions (Addendum)
Medication Instructions:  Your physician recommends that you continue on your current medications as directed. Please refer to the Current Medication list given to you today.   Labwork: Lipid and liver profile today  Testing/Procedures: Your physician has requested that you have an echocardiogram. Echocardiography is a painless test that uses sound waves to create images of your heart. It provides your doctor with information about the size and shape of your heart and how well your heart's chambers and valves are working. This procedure takes approximately one hour. There are no restrictions for this procedure.    Follow-Up: Your physician wants you to follow-up in: 6 months with Dr. Fletcher Hernandez.  You will receive a reminder letter in the mail two months in advance. If you don't receive a letter, please call our office to schedule the follow-up appointment.   Any Other Special Instructions Will Be Listed Below (If Applicable).     If you need a refill on your cardiac medications before your next appointment, please call your pharmacy.  Echocardiogram An echocardiogram, or echocardiography, uses sound waves (ultrasound) to produce an image of your heart. The echocardiogram is simple, painless, obtained within a short period of time, and offers valuable information to your health care provider. The images from an echocardiogram can provide information such as:  Evidence of coronary artery disease (CAD).  Heart size.  Heart muscle function.  Heart valve function.  Aneurysm detection.  Evidence of a past heart attack.  Fluid buildup around the heart.  Heart muscle thickening.  Assess heart valve function. Tell a health care provider about:  Any allergies you have.  All medicines you are taking, including vitamins, herbs, eye drops, creams, and over-the-counter medicines.  Any problems you or family members have had with anesthetic medicines.  Any blood disorders you  have.  Any surgeries you have had.  Any medical conditions you have.  Whether you are pregnant or may be pregnant. What happens before the procedure? No special preparation is needed. Eat and drink normally. What happens during the procedure?  In order to produce an image of your heart, gel will be applied to your chest and a wand-like tool (transducer) will be moved over your chest. The gel will help transmit the sound waves from the transducer. The sound waves will harmlessly bounce off your heart to allow the heart images to be captured in real-time motion. These images will then be recorded.  You may need an IV to receive a medicine that improves the quality of the pictures. What happens after the procedure? You may return to your normal schedule including diet, activities, and medicines, unless your health care provider tells you otherwise. This information is not intended to replace advice given to you by your health care provider. Make sure you discuss any questions you have with your health care provider. Document Released: 09/16/2000 Document Revised: 05/07/2016 Document Reviewed: 05/27/2013 Elsevier Interactive Patient Education  2017 [employer].

## 2016-09-05 NOTE — Progress Notes (Signed)
Cardiology Office Note   Date:  09/05/2016   ID:  Isabel Hernandez, Isabel Hernandez Aug 23, 1941, MRN [id number]  PCP:  Tommi Rumps, MD  Cardiologist:   Kathlyn Sacramento, MD   Chief Complaint  Patient presents with  . other    76mo f/u. Pt c/o recurrent sob. Reviewed meds with pt verbally.      History of Present Illness: Isabel Hernandez is a 75 y.o. female who is here today for a follow up visit regarding nonobstructive carotid stenosis, hypertension and hyperlipidemia. No previous cardiac history. No prior history of stroke. Most recent carotid Doppler was done in January 2017 which showed mild bilateral atherosclerosis with possible ulceration in the left mid common carotid artery.  She had a nuclear stress test in February 2017 which was normal overall. She is also known to have aortic atherosclerosis. She had an event monitor in May 2015 which showed occasional PVCs but no other significant arrhythmia.  Echocardiogram in 2015 showed normal LV systolic function with an ejection fraction of 65%, mild mitral regurgitation, mild tricuspid regurgitation with mild pulmonary hypertension.  She had worsening dyspnea on exertiona recently thought to be due to allergies and aspiration pneumonitis. She was seen by pulmonary and was then referred to GI. Her symptoms slightly improved with the PPI but she continues to complain of exertional dyspnea with no orthopnea, PND or leg edema. She denies any chest discomfort.   Past Medical History:  Diagnosis Date  . A-fib ([place])   . Arthritis   . Breast cancer ([place]) 2002   RT LUMPECTOMY  . Bronchitis   . Carotid artery narrowings   . 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 Carotid artery occlusion    RIGHT 50% BLOCKAGE  . Colon polyps   . Complication of anesthesia    shaking/ FOLLOWING LOCAL AT DENTIST , drop O2 sats TO 50% DURING COLONOSCOPY  . Coronary artery disease   . Diverticulosis   . Dysrhythmia   . Environmental allergies   . GERD  (gastroesophageal reflux disease)   . History of hiatal hernia   . Hyperlipidemia   . Hypertension   . IBS (irritable bowel syndrome)   . Mitral valve disorder   . Shortness of breath dyspnea   . Syncope and collapse   . UTI (lower urinary tract infection)     Past Surgical History:  Procedure Laterality Date  . APPENDECTOMY    . BREAST BIOPSY Right 2002   POS  . BREAST CYST ASPIRATION Right   . BREAST EXCISIONAL BIOPSY Left 1997   NEG  . CATARACT EXTRACTION W/PHACO Right 06/07/2016   Procedure: CATARACT EXTRACTION PHACO AND INTRAOCULAR LENS PLACEMENT (IOC);  Surgeon: Birder Robson, MD;  Location: [facility] ORS;  Service: Ophthalmology;  Laterality: Right;  [place] 00:51 AP% 19.6 CDE 10.09 Fluid pack lot # [id number]  . CATARACT EXTRACTION W/PHACO Left 07/05/2016   Procedure: CATARACT EXTRACTION PHACO AND INTRAOCULAR LENS PLACEMENT (IOC);  Surgeon: Birder Robson, MD;  Location: [facility] ORS;  Service: Ophthalmology;  Laterality: Left;  [place] 00:38 AP% 24.9 CDE 9.60 Fluid Pack lot # [id number]  . CESAREAN SECTION    . TONSILLECTOMY    . TOTAL KNEE ARTHROPLASTY Left      Current Outpatient Prescriptions  Medication Sig Dispense Refill  . amLODipine (NORVASC) 5 MG tablet Take 1 tablet (5 mg total) by mouth daily. 90 tablet 3  . aspirin 81 MG tablet Take 81 mg by mouth daily.    Marland Kitchen atorvastatin (LIPITOR)  20 MG tablet Take 20 mg by mouth daily.     Marland Kitchen BROMSITE 0.075 % SOLN     . Calcium Carbonate (CALTRATE 600 PO) Take 1 tablet by mouth 2 (two) times daily.    . clobetasol ointment (TEMOVATE) [id number] % Apply 1 application topically 2 (two) times daily as needed.    . DUREZOL 0.05 % EMUL     . fenofibrate (TRICOR) 145 MG tablet Take 145 mg by mouth daily.     . hydrochlorothiazide (MICROZIDE) 12.5 MG capsule Take 12.5 mg by mouth daily.     Marland Kitchen losartan (COZAAR) 50 MG tablet Take 50 mg by mouth daily.     . metoprolol tartrate (LOPRESSOR) 25 MG tablet Take 25 mg by mouth 2 (two) times daily.     .  Multiple Vitamin (MULTIVITAMIN WITH MINERALS) TABS tablet Take 1 tablet by mouth daily.    . Omega-3 Fatty Acids (FISH OIL) 1000 MG CAPS Take by mouth.    . pantoprazole (PROTONIX) 40 MG tablet Take 1 tablet (40 mg total) by mouth 2 (two) times daily. 60 tablet 11   No current facility-administered medications for this visit.     Allergies:   Biaxin [clarithromycin]; Latex; Nickel; Nitroglycerin; Oxycodone; Penicillins; Tetracyclines & related; and Tizanidine    Social History:  The patient  reports that she has quit smoking. Her smoking use included Cigarettes. She quit after 10.00 years of use. She has never used smokeless tobacco. She reports that she drinks alcohol. She reports that she does not use drugs.   Family History:  The patient's family history includes Diabetes in her brother; Heart disease in her mother; Hyperlipidemia in her brother; Hypertension in her brother; Lung cancer in her father and sister; Prostate cancer in her father.    ROS:  Please see the history of present illness.   Otherwise, review of systems are positive for none.   All other systems are reviewed and negative.    PHYSICAL EXAM: VS:  BP 112/74 (BP Location: Left Arm, Patient Position: Sitting, Cuff Size: Normal)   Pulse 69   Ht 5\' 4"  (1.626 m)   Wt 160 lb 8 oz (72.8 kg)   BMI 27.55 kg/m  , BMI Body mass index is 27.55 kg/m. GEN: Well nourished, well developed, in no acute distress  HEENT: normal  Neck: no JVD, carotid bruits, or masses Cardiac: RRR; no murmurs, rubs, or gallops,no edema  Respiratory:  clear to auscultation bilaterally, normal work of breathing GI: soft, nontender, nondistended, + BS MS: no deformity or atrophy  Skin: warm and dry, no rash Neuro:  Strength and sensation are intact Psych: euthymic mood, full affect   EKG:  EKG is ordered today. The ekg ordered today demonstrates normal sinus rhythm with no significant ST or T wave changes. No evidence of prior  infarct.   Recent Labs: 05/19/2016: BUN 22; Creatinine, Ser 1.06; Potassium 4.3; Sodium 138 08/19/2016: Hemoglobin 12.5; Platelets 141.0    Lipid Panel No results found for: CHOL, TRIG, HDL, CHOLHDL, VLDL, LDLCALC, LDLDIRECT    Wt Readings from Last 3 Encounters:  09/05/16 160 lb 8 oz (72.8 kg)  08/19/16 161 lb 3.2 oz (73.1 kg)  06/23/16 157 lb (71.2 kg)        ASSESSMENT AND PLAN:  1.  Moderate left carotid stenosis: No previous history of stroke. Continue treatment with aspirin, blood pressure control and treatment of hyperlipidemia. I requested a follow-up carotid Doppler which is scheduled for January  2. Essential  hypertension: Blood pressure is well controlled on current medications.  3. Exertional dyspnea: Her symptoms were felt to be due to aspiration pneumonitis from GERD with some improvement with treatment with PPI but she continues to be symptomatic. I requested an echocardiogram for evaluation. She did have ischemic cardiac workup with a stress test earlier this year and her EKG is normal with no chest pain. Thus, I don't think a repeat stress test is needed at the present time.  4. Hyperlipidemia: She is currently on atorvastatin. Recommend a target LDL of less than 100 and preferably less than 70. I requested lipid and liver profile.   Disposition:   FU with me in 6 months  Signed,  Kathlyn Sacramento, MD  09/05/2016 2:00 PM    [place]

## 2016-09-06 LAB — LIPID PANEL
CHOL/HDL RATIO: 7 ratio — AB (ref 0.0–4.4)
CHOLESTEROL TOTAL: 133 mg/dL (ref 100–199)
HDL: 19 mg/dL — ABNORMAL LOW (ref >39)
LDL CALC: 81 mg/dL (ref 0–99)
Triglycerides: 165 mg/dL — ABNORMAL HIGH (ref 0–149)
VLDL CHOLESTEROL CAL: 33 mg/dL (ref 5–40)

## 2016-09-06 LAB — HEPATIC FUNCTION PANEL
ALK PHOS: 35 IU/L — AB (ref 39–117)
ALT: 18 IU/L (ref 0–32)
AST: 32 IU/L (ref 0–40)
Albumin: 4.5 g/dL (ref 3.5–4.8)
BILIRUBIN TOTAL: 1 mg/dL (ref 0.0–1.2)
BILIRUBIN, DIRECT: 0.34 mg/dL (ref 0.00–0.40)
TOTAL PROTEIN: 6.8 g/dL (ref 6.0–8.5)

## 2016-09-15 ENCOUNTER — Ambulatory Visit: Payer: Medicare Other | Admitting: Internal Medicine

## 2016-09-20 ENCOUNTER — Other Ambulatory Visit (INDEPENDENT_AMBULATORY_CARE_PROVIDER_SITE_OTHER): Payer: Medicare Other

## 2016-09-20 DIAGNOSIS — D696 Thrombocytopenia, unspecified: Secondary | ICD-10-CM

## 2016-09-20 LAB — CBC
HEMATOCRIT: 35.6 % — AB (ref 36.0–46.0)
HEMOGLOBIN: 12.3 g/dL (ref 12.0–15.0)
MCHC: 34.6 g/dL (ref 30.0–36.0)
MCV: 97.5 fl (ref 78.0–100.0)
Platelets: 149 10*3/uL — ABNORMAL LOW (ref 150.0–400.0)
RBC: 3.65 Mil/uL — AB (ref 3.87–5.11)
RDW: 14.3 % (ref 11.5–15.5)
WBC: 5.3 10*3/uL (ref 4.0–10.5)

## 2016-10-05 ENCOUNTER — Other Ambulatory Visit: Payer: Medicare Other

## 2016-10-07 ENCOUNTER — Ambulatory Visit (INDEPENDENT_AMBULATORY_CARE_PROVIDER_SITE_OTHER): Payer: Medicare Other | Admitting: Family Medicine

## 2016-10-07 ENCOUNTER — Encounter: Payer: Self-pay | Admitting: Family Medicine

## 2016-10-07 DIAGNOSIS — J988 Other specified respiratory disorders: Secondary | ICD-10-CM | POA: Diagnosis not present

## 2016-10-07 MED ORDER — LEVOFLOXACIN 500 MG PO TABS: 500.0000 mg | ORAL_TABLET | Freq: Every day | ORAL | 0 refills | Status: DC

## 2016-10-07 MED ORDER — PREDNISONE 50 MG PO TABS: ORAL_TABLET | ORAL | 0 refills | Status: DC

## 2016-10-07 NOTE — Progress Notes (Signed)
Pre visit review using our clinic review tool, if applicable. No additional management support is needed unless otherwise documented below in the visit note. 

## 2016-10-07 NOTE — Assessment & Plan Note (Signed)
New acute problem. Patient underlying pulmonary history is unclear. There are notes that suggests that she has underlying chronic bronchitis. I discussed her case with her primary care physician. He recommended that I treat her as I would someone who had chronic bronchitis/COPD. Treating with prednisone and Levaquin. Could not use other antibiotics given allergies.

## 2016-10-07 NOTE — Patient Instructions (Signed)
Take the medications as prescribed.  Take care  Dr. Brant Peets 

## 2016-10-07 NOTE — Progress Notes (Signed)
Subjective:  Patient ID: Isabel Hernandez, female    DOB: 01-Jun-1941  Age: 76 y.o. MRN: [id number]  CC: Cough, congestion  HPI:  76 year old female with an unclear pulmonary history that has been thought to be chronic bronchitis versus potential aspiration pneumonitis presents with the above complaints.  She states that she developed cough and congestion yesterday. She has been wheezing. She is coughing up discolored sputum. No associated shortness of breath. No fever. No known exacerbating or relieving factors. She is no longer on inhalers. No other associated symptoms. No other complaints or concerns at this time.  Social Hx   Social History   Social History  . Marital status: Married    Spouse name: N/A  . Number of children: N/A  . Years of education: N/A   Social History Main Topics  . Smoking status: Former Smoker    Years: 10.00    Types: Cigarettes  . Smokeless tobacco: Never Used  . Alcohol use 0.0 oz/week     Comment: couple drinks/week  . Drug use: No  . Sexual activity: Not Asked   Other Topics Concern  . None   Social History Narrative  . None   Review of Systems  Constitutional: Negative for fever.  Respiratory: Positive for cough, chest tightness and wheezing.    Objective:  BP (!) 146/80   Pulse 81   Temp 98.4 F (36.9 C) (Oral)   Resp 14   Wt 157 lb 1.9 oz (71.3 kg)   SpO2 96%   BMI 26.97 kg/m   BP/Weight 10/07/2016 09/05/2016 [id number]  Systolic BP 123456 [id number]  Diastolic BP 80 74 64  Wt. (Lbs) 157.12 160.5 161.2  BMI 26.97 27.55 27.67   Physical Exam  Constitutional: She is oriented to person, place, and time. She appears well-developed. No distress.  HENT:  Mouth/Throat: Oropharynx is clear and moist.  Cardiovascular: Normal rate and regular rhythm.   Pulmonary/Chest: Effort normal.  Diffuse expiratory wheezing.  Neurological: She is alert and oriented to person, place, and time.  Psychiatric: She has a normal mood and affect.  Vitals  reviewed.  Lab Results  Component Value Date   WBC 5.3 09/20/2016   HGB 12.3 09/20/2016   HCT 35.6 (L) 09/20/2016   PLT 149.0 (L) 09/20/2016   GLUCOSE 101 (H) 05/19/2016   CHOL 133 09/05/2016   TRIG 165 (H) 09/05/2016   HDL 19 (L) 09/05/2016   LDLCALC 81 09/05/2016   ALT 18 09/05/2016   AST 32 09/05/2016   NA 138 05/19/2016   K 4.3 05/19/2016   CL 102 05/19/2016   CREATININE 1.06 05/19/2016   BUN 22 05/19/2016   CO2 29 05/19/2016    Assessment & Plan:   Problem List Items Addressed This Visit    Respiratory infection    New acute problem. Patient underlying pulmonary history is unclear. There are notes that suggests that she has underlying chronic bronchitis. I discussed her case with her primary care physician. He recommended that I treat her as I would someone who had chronic bronchitis/COPD. Treating with prednisone and Levaquin. Could not use other antibiotics given allergies.         Meds ordered this encounter  Medications  . predniSONE (DELTASONE) 50 MG tablet    Sig: 1 tablet daily x 5 days.    Dispense:  5 tablet    Refill:  0  . levofloxacin (LEVAQUIN) 500 MG tablet    Sig: Take 1 tablet (500 mg total) by  mouth daily.    Dispense:  7 tablet    Refill:  0    Follow-up: PRN  [place]

## 2016-10-12 ENCOUNTER — Telehealth: Payer: Self-pay | Admitting: Family Medicine

## 2016-10-12 DIAGNOSIS — J181 Lobar pneumonia, unspecified organism: Secondary | ICD-10-CM | POA: Diagnosis not present

## 2016-10-12 DIAGNOSIS — R05 Cough: Secondary | ICD-10-CM | POA: Diagnosis not present

## 2016-10-12 NOTE — Telephone Encounter (Signed)
Agree patient should be evaluated at walk in. Thanks for advising her of this previously.

## 2016-10-12 NOTE — Telephone Encounter (Signed)
Pt called and stated that she saw Dr. Lacinda Axon on Friday and was dx with bronchitis. PT is stating that she is not feeling any better and that her chest still hurt, coughing, back pain, and rt lower leg hurts. Please advise, thank you!  Call pt @ [phone]

## 2016-10-12 NOTE — Telephone Encounter (Signed)
Reason for call: right leg ankle , hurts to walk  bear weight, on right foot X  2 days,  When she lifts foot doesn't look discolored or red to her that she or her husband can see.    Symptoms: wheezing, chest tightness Medications: Prednisone, Levofloxacin  HTN, Hyperlipidemia Last seen for this problem: bronchitis  10/07/16 by Dr Lacinda Axon  Hasn't taken aspirin 81 mg in 1 week patient is going to [employer] in clinic

## 2016-10-24 ENCOUNTER — Ambulatory Visit (INDEPENDENT_AMBULATORY_CARE_PROVIDER_SITE_OTHER): Payer: Medicare Other

## 2016-10-24 ENCOUNTER — Ambulatory Visit: Payer: Medicare Other

## 2016-10-24 ENCOUNTER — Other Ambulatory Visit: Payer: Self-pay

## 2016-10-24 DIAGNOSIS — I6529 Occlusion and stenosis of unspecified carotid artery: Secondary | ICD-10-CM

## 2016-10-24 DIAGNOSIS — I10 Essential (primary) hypertension: Secondary | ICD-10-CM

## 2016-10-24 DIAGNOSIS — R0600 Dyspnea, unspecified: Secondary | ICD-10-CM

## 2016-10-24 DIAGNOSIS — I6523 Occlusion and stenosis of bilateral carotid arteries: Secondary | ICD-10-CM | POA: Diagnosis not present

## 2016-10-24 LAB — ECHOCARDIOGRAM COMPLETE
CHL CUP DOP CALC LVOT VTI: 31.6 cm
CHL CUP STROKE VOLUME: 28 mL
EERAT: 8.83
EWDT: 304 ms
FS: 41 % (ref 28–44)
IVS/LV PW RATIO, ED: 0.87
LA ID, A-P, ES: 30 mm
LADIAMINDEX: 1.66 cm/m2
LAVOL: 36.5 mL
LAVOLA4C: 27 mL
LAVOLIN: 20.2 mL/m2
LEFT ATRIUM END SYS DIAM: 30 mm
LV E/e' medial: 8.83
LV PW d: 9.5 mm — AB (ref 0.6–1.1)
LV SIMPSON'S DISK: 70
LV TDI E'MEDIAL: 5.25
LV dias vol index: 22 mL/m2
LV dias vol: 40 mL — AB (ref 46–106)
LVEEAVG: 8.83
LVELAT: 8.63 cm/s
LVOT area: 2.84 cm2
LVOT peak grad rest: 7 mmHg
LVOT peak vel: 130 cm/s
LVOTD: 19 mm
LVOTSV: 90 mL
LVSYSVOL: 12 mL — AB (ref 14–42)
LVSYSVOLIN: 7 mL/m2
MV Dec: 304
MV Peak grad: 2 mmHg
MV pk A vel: 103 m/s
MVPKEVEL: 76.2 m/s
RV LATERAL S' VELOCITY: 12.8 cm/s
RV TAPSE: 23 mm
Reg peak vel: 250 cm/s
TDI e' lateral: 8.63
TRMAXVEL: 250 cm/s

## 2016-10-30 ENCOUNTER — Emergency Department: Payer: Medicare Other

## 2016-10-30 ENCOUNTER — Encounter: Payer: Self-pay | Admitting: Emergency Medicine

## 2016-10-30 ENCOUNTER — Emergency Department
Admission: EM | Admit: 2016-10-30 | Discharge: 2016-10-30 | Disposition: A | Discharge status: Home / Self Care | Payer: Medicare Other | Attending: Student in an Organized Health Care Education/Training Program | Admitting: Student in an Organized Health Care Education/Training Program

## 2016-10-30 DIAGNOSIS — I2510 Atherosclerotic heart disease of native coronary artery without angina pectoris: Secondary | ICD-10-CM | POA: Diagnosis not present

## 2016-10-30 DIAGNOSIS — M25561 Pain in right knee: Secondary | ICD-10-CM

## 2016-10-30 DIAGNOSIS — Z7982 Long term (current) use of aspirin: Secondary | ICD-10-CM | POA: Diagnosis not present

## 2016-10-30 DIAGNOSIS — Z79899 Other long term (current) drug therapy: Secondary | ICD-10-CM | POA: Insufficient documentation

## 2016-10-30 DIAGNOSIS — I10 Essential (primary) hypertension: Secondary | ICD-10-CM | POA: Diagnosis not present

## 2016-10-30 DIAGNOSIS — M7121 Synovial cyst of popliteal space [Baker], right knee: Secondary | ICD-10-CM

## 2016-10-30 DIAGNOSIS — Z87891 Personal history of nicotine dependence: Secondary | ICD-10-CM | POA: Insufficient documentation

## 2016-10-30 DIAGNOSIS — Z853 Personal history of malignant neoplasm of breast: Secondary | ICD-10-CM | POA: Diagnosis not present

## 2016-10-30 MED ORDER — NAPROXEN 375 MG PO TABS: 375.0000 mg | ORAL_TABLET | Freq: Two times a day (BID) | ORAL | 0 refills | Status: AC

## 2016-10-30 MED ORDER — TRAMADOL HCL 50 MG PO TABS: 50.0000 mg | ORAL_TABLET | Freq: Four times a day (QID) | ORAL | 0 refills | Status: DC | PRN

## 2016-10-30 NOTE — ED Triage Notes (Signed)
Pt comes into the ED via POV c/o pain in her right leg.  Patient states the pain started in her ankle then moved to behind the knee.  Patient has been laying around for a couple of weeks due to being at home with pneumonia.  Patient is concerned for a blood clot.  Patient denies any swelling, or heat to that leg but states that she feels "a knot" behind the right knee.  Patient explains the pain is worse with ambulation.

## 2016-10-30 NOTE — ED Notes (Signed)

## 2016-10-30 NOTE — ED Provider Notes (Signed)
[facility] Emergency Department Provider Note    None    (approximate)  I have reviewed the triage vital signs and the nursing notes.   HISTORY  Chief Complaint Leg Pain    HPI Isabel Hernandez is a 76 y.o. female chief complaint of right leg pain. Patient states she recently completed a course of antibiotics for pneumonia and has been laying around in bed and is worried that she has a blood clot. Denies any redness. States the pain started around her ankle and then radiated up to her right knee. Is having pain with ambulating. States is roughly 6 out of 10 in severity. Does not have any significant pain at rest. No previous history of blood clots.   Past Medical History:  Diagnosis Date  . A-fib ([place])   . Arthritis   . Breast cancer ([place]) 2002   RT LUMPECTOMY  . Bronchitis   . Carotid artery narrowings   . 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 Carotid artery occlusion    RIGHT 50% BLOCKAGE  . Colon polyps   . Complication of anesthesia    shaking/ FOLLOWING LOCAL AT DENTIST , drop O2 sats TO 50% DURING COLONOSCOPY  . Coronary artery disease   . Diverticulosis   . Dysrhythmia   . Environmental allergies   . GERD (gastroesophageal reflux disease)   . History of hiatal hernia   . Hyperlipidemia   . Hypertension   . IBS (irritable bowel syndrome)   . Mitral valve disorder   . Shortness of breath dyspnea   . Syncope and collapse   . UTI (lower urinary tract infection)    Family History  Problem Relation Age of Onset  . Lung cancer Father   . Prostate cancer Father   . Hyperlipidemia Brother   . Heart disease Mother   . Lung cancer Sister   . Hypertension Brother   . Kidney disease      Maternal grandparent  . Diabetes Brother   . Breast cancer Neg Hx    Past Surgical History:  Procedure Laterality Date  . APPENDECTOMY    . BREAST BIOPSY Right 2002   POS  . BREAST CYST ASPIRATION Right   . BREAST EXCISIONAL BIOPSY Left 1997     NEG  . CATARACT EXTRACTION W/PHACO Right 06/07/2016   Procedure: CATARACT EXTRACTION PHACO AND INTRAOCULAR LENS PLACEMENT (IOC);  Surgeon: Birder Robson, MD;  Location: [facility] ORS;  Service: Ophthalmology;  Laterality: Right;  [place] 00:51 AP% 19.6 CDE 10.09 Fluid pack lot # [id number]  . CATARACT EXTRACTION W/PHACO Left 07/05/2016   Procedure: CATARACT EXTRACTION PHACO AND INTRAOCULAR LENS PLACEMENT (IOC);  Surgeon: Birder Robson, MD;  Location: [facility] ORS;  Service: Ophthalmology;  Laterality: Left;  [place] 00:38 AP% 24.9 CDE 9.60 Fluid Pack lot # [id number]  . CESAREAN SECTION    . TONSILLECTOMY    . TOTAL KNEE ARTHROPLASTY Left    Patient Active Problem List   Diagnosis Date Noted  . Respiratory infection 10/07/2016  . Pneumonitis 05/19/2016  . Dysphagia 05/19/2016  . NSAID long-term use 03/22/2016  . Primary osteoarthritis of left knee 03/22/2016  . Primary osteoarthritis of right hand 03/22/2016  . Rheumatoid factor positive 03/22/2016  . Essential hypertension 02/17/2016  . Carotid stenosis 02/17/2016  . Abnormal chest x-ray 02/17/2016  . Hyperlipidemia 02/17/2016  . Allergic rhinitis 02/09/2016      Prior to Admission medications   Medication Sig Start Date End Date Taking? Authorizing Provider  amLODipine (NORVASC) 5 MG tablet Take 1 tablet (5 mg total) by mouth daily. 03/08/16   Wellington Hampshire, MD  aspirin 81 MG tablet Take 81 mg by mouth daily.    Historical Provider, MD  atorvastatin (LIPITOR) 20 MG tablet Take 20 mg by mouth daily.  11/13/15   Historical Provider, MD  BROMSITE 0.075 % SOLN  05/31/16   Historical Provider, MD  Calcium Carbonate (CALTRATE 600 PO) Take 1 tablet by mouth 2 (two) times daily.    Historical Provider, MD  clobetasol ointment (TEMOVATE) [id number] % Apply 1 application topically 2 (two) times daily as needed.    Historical Provider, MD  DUREZOL 0.05 % EMUL  05/31/16   Historical Provider, MD  fenofibrate (TRICOR) 145 MG tablet Take 145 mg by mouth daily.   11/13/15   Historical Provider, MD  hydrochlorothiazide (MICROZIDE) 12.5 MG capsule Take 12.5 mg by mouth daily.  11/20/15   Historical Provider, MD  levofloxacin (LEVAQUIN) 500 MG tablet Take 1 tablet (500 mg total) by mouth daily. 10/07/16   Coral Spikes, DO  losartan (COZAAR) 50 MG tablet Take 50 mg by mouth daily.  01/21/16   Historical Provider, MD  metoprolol tartrate (LOPRESSOR) 25 MG tablet Take 25 mg by mouth 2 (two) times daily.  11/13/15   Historical Provider, MD  Multiple Vitamin (MULTIVITAMIN WITH MINERALS) TABS tablet Take 1 tablet by mouth daily.    Historical Provider, MD  Omega-3 Fatty Acids (FISH OIL) 1000 MG CAPS Take by mouth.    Historical Provider, MD  pantoprazole (PROTONIX) 40 MG tablet Take 1 tablet (40 mg total) by mouth 2 (two) times daily. 06/14/16   Lucilla Lame, MD  predniSONE (DELTASONE) 50 MG tablet 1 tablet daily x 5 days. 10/07/16   Coral Spikes, DO    Allergies Biaxin [clarithromycin]; Latex; Nickel; Nitroglycerin; Oxycodone; Penicillins; Tetracyclines & related; and Tizanidine    Social History Social History  Substance Use Topics  . Smoking status: Former Smoker    Years: 10.00    Types: Cigarettes  . Smokeless tobacco: Never Used  . Alcohol use 0.0 oz/week     Comment: couple drinks/week    Review of Systems Patient denies headaches, rhinorrhea, blurry vision, numbness, shortness of breath, chest pain, edema, cough, abdominal pain, nausea, vomiting, diarrhea, dysuria, fevers, rashes or hallucinations unless otherwise stated above in HPI. ____________________________________________   PHYSICAL EXAM:  VITAL SIGNS: Vitals:   10/30/16 1307  BP: 123/66  Pulse: 85  Resp: 16  Temp: 98.3 F (36.8 C)    Constitutional: Alert and oriented. Well appearing and in no acute distress. Eyes: Conjunctivae are normal. PERRL. EOMI. Head: Atraumatic. Nose: No congestion/rhinnorhea. Mouth/Throat: Mucous membranes are moist.  Oropharynx non-erythematous. Neck:  No stridor. Painless ROM. No cervical spine tenderness to palpation Hematological/Lymphatic/Immunilogical: No cervical lymphadenopathy. Cardiovascular: Normal rate, regular rhythm. Grossly normal heart sounds.  Good peripheral circulation. Respiratory: Normal respiratory effort.  No retractions. Lungs CTAB. Gastrointestinal: Soft and nontender. No distention. No abdominal bruits. No CVA tenderness. Musculoskeletal: No lower extremity  Edema.  Fluctuant non erythematous non tense cystic structure in right popliteal fossa.  No joint effusions. Neurologic:  Normal speech and language. No gross focal neurologic deficits are appreciated. No gait instability. Skin:  Skin is warm, dry and intact. No rash noted. Psychiatric: Mood and affect are normal. Speech and behavior are normal.  ____________________________________________   LABS (all labs ordered are listed, but only abnormal results are displayed)  No results found for this or  any previous visit (from the past 24 hour(s)). ____________________________________________ ____________________________________________  [id number]  I personally reviewed all radiographic images ordered to evaluate for the above acute complaints and reviewed radiology reports and findings.  These findings were personally discussed with the patient.  Please see medical record for radiology report.  ____________________________________________   PROCEDURES  Procedure(s) performed:  Procedures    Critical Care performed: no ____________________________________________   INITIAL IMPRESSION / ASSESSMENT AND PLAN / ED COURSE  Pertinent labs & imaging results that were available during my care of the patient were reviewed by me and considered in my medical decision making (see chart for details).  DDX: DVT, effusion, cyst arthritis  Isabel Hernandez is a 76 y.o. who presents to the ED with right knee and leg pain as described above. Patient afebrile hemodynamic  stable. There is no evidence of trauma. Lower extremity duplex shows no evidence of DVT. There is no significant edema. There is no evidence of infectious process or joint effusion. She does have a mildly tender fluctuant cystic structure in the right popliteal fossa consistent with Baker's cyst. She has brisk cap refill distally and this is not consistent with acute vascular pathology. Patient stable for follow-up with PCP.  Patient was able to tolerate PO and was able to ambulate with a steady gait.  Have discussed with the patient and available family all diagnostics and treatments performed thus far and all questions were answered to the best of my ability. The patient demonstrates understanding and agreement with plan.       ____________________________________________   FINAL CLINICAL IMPRESSION(S) / ED DIAGNOSES  Final diagnoses:  Acute pain of right knee  Baker's cyst of knee, right      NEW MEDICATIONS STARTED DURING THIS VISIT:  New Prescriptions   No medications on file     Note:  This document was prepared using Dragon voice recognition software and may include unintentional dictation errors.    Merlyn Lot, MD 10/30/16 [phone]

## 2016-10-31 NOTE — Progress Notes (Signed)
[facility] Consultation      Assessment and Plan:   GERD with dysphagia and aspiration.  --Discussed strategies to reduce reflux including avoiding fatty meals, and not eating 4 hours before bedtime.  -The patient has a history of esophageal narrowing/stricture, for which she has undergone a esophageal dilatation in early 2017.  --She was referred to Isabel Hernandez, Gastroenterology and prescribed protonix 40 mg bid, but did not take it due to fatigue. She prefers not to return to Isabel Hernandez. Will refer to Isabel Hernandez.  --Will try lansoprazole.  Lung nodule. -Patient has a family history of 2 first-degree relatives with lung cancer, she also has a history of smoking.  --Review of CT shows small,  low risk nodules present in the lungs --Repeat CT chest in 6 months-- will order at next visit.   Asthma and/or aspiration pneumonitis.  -I suspect that her dyspnea/asthma is predominantly due to aspiration pneumonitis secondary to esophageal stasis and reflux. --did not respond to trial of anoro inhaler.  --I suspect that this is secondary to aspiration pneumonitis due to Esophageal narrowing, with reflux. --Review of CT chest shows no primary lung pathology to explain her dyspnea. Echo is normal. PFT is normal.        Date: 10/31/2016  MRN# [id number] Isabel Hernandez   Isabel Hernandez is a 76 y.o. old female seen in consultation for chief complaint of:    Chief Complaint  Patient presents with  . Follow-up    pt states she was dx with PNA 10-12-16. finished abx 1wk ago. pt c/o sob with exertion & mild prod cough with yellowish to brown mucus    HPI:   The patient is a 76 year old female who recently saw Dr. Caryl Hernandez to establish care. She has a history of a sister with lung cancer.  Isabel Hernandez previously smoked for 10 years and quit in 1971. She had a chest x-ray performed which apparently showed granulomatous disease.   Her father and sister had lung cancer. She notes that  she has some dyspnea with moderate exertion. She notes that she has periodic trouble with breathing, she also has periodic trouble with swallowing, she has periodic stretching of the esophagus, last was in March. Currently noticing that she has trouble with food and liquids getting blocked in the throat and sometimes has trouble swallowing water. This is daily, at almost every meal.   She has since seen gastroenterology and was prescribed protonix 40 mg bid, but she notices that it makes her very tired with little energy. She notes that her reflux continues to be bad. Whenever she eats she had a burning sensation in her esophagus with pain, and sometimes feels that food and liquids take a long time to make their way down her esophagus.    CT chest 05/03/16 images  reviewed; 1-25mm nodule noted and 45mm nodules noted in right lung. There is some scattered infiltrates at the bases bilaterally. These are very subtle. No other abnormalities noted.  Review of outside chest x-ray report 12/01/15 left base granuloma seen, new since previous study on 07/17/14. No current films are available for review.   Echocardiogram in 2015 showed normal LV systolic function with an ejection fraction of 65%, mild mitral regurgitation, mild tricuspid regurgitation with mild pulmonary hypertension.   Review of bony function testing performed on 05/02/16: Spirometry FVC was 99% of predicted, FEV1 was 97% predicted, there is no significant improvement with bronchodilator. FEV to FVC ratio was 73%. Lung volumes. Residual volume  was 83% of predicted, RV/TLC ratio is 112% of predicted, total lung capacity was 74% of predicted. Diffusion: Diffusion capacity was 95% of predicted. Flow volume loop was normal. Interpretation: Overall this test shows normal pulmonary functions without evidence of obstructive lung disease.  PMHX:   Past Medical History:  Diagnosis Date  . A-fib ([place])   . Arthritis   . Breast cancer ([place]) 2002   RT  LUMPECTOMY  . Bronchitis   . Carotid artery narrowings   . 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 Carotid artery occlusion    RIGHT 50% BLOCKAGE  . Colon polyps   . Complication of anesthesia    shaking/ FOLLOWING LOCAL AT DENTIST , drop O2 sats TO 50% DURING COLONOSCOPY  . Coronary artery disease   . Diverticulosis   . Dysrhythmia   . Environmental allergies   . GERD (gastroesophageal reflux disease)   . History of hiatal hernia   . Hyperlipidemia   . Hypertension   . IBS (irritable bowel syndrome)   . Mitral valve disorder   . Shortness of breath dyspnea   . Syncope and collapse   . UTI (lower urinary tract infection)    Surgical Hx:  Past Surgical History:  Procedure Laterality Date  . APPENDECTOMY    . BREAST BIOPSY Right 2002   POS  . BREAST CYST ASPIRATION Right   . BREAST EXCISIONAL BIOPSY Left 1997   NEG  . CATARACT EXTRACTION W/PHACO Right 06/07/2016   Procedure: CATARACT EXTRACTION PHACO AND INTRAOCULAR LENS PLACEMENT (IOC);  Surgeon: Isabel Robson, MD;  Location: [facility] ORS;  Service: Ophthalmology;  Laterality: Right;  [place] 00:51 AP% 19.6 CDE 10.09 Fluid pack lot # [id number]  . CATARACT EXTRACTION W/PHACO Left 07/05/2016   Procedure: CATARACT EXTRACTION PHACO AND INTRAOCULAR LENS PLACEMENT (IOC);  Surgeon: Isabel Robson, MD;  Location: [facility] ORS;  Service: Ophthalmology;  Laterality: Left;  [place] 00:38 AP% 24.9 CDE 9.60 Fluid Pack lot # [id number]  . CESAREAN SECTION    . TONSILLECTOMY    . TOTAL KNEE ARTHROPLASTY Left    Family Hx:  Family History  Problem Relation Age of Onset  . Lung cancer Father   . Prostate cancer Father   . Hyperlipidemia Brother   . Heart disease Mother   . Lung cancer Sister   . Hypertension Brother   . Kidney disease      Maternal grandparent  . Diabetes Brother   . Breast cancer Neg Hx    Social Hx:   Social History  Substance Use Topics  . Smoking status: Former Smoker    Years: 10.00    Types: Cigarettes  .  Smokeless tobacco: Never Used  . Alcohol use 0.0 oz/week     Comment: couple drinks/week   Medication:    Reviewed   Allergies:  Biaxin [clarithromycin]; Latex; Nickel; Nitroglycerin; Oxycodone; Penicillins; Tetracyclines & related; and Tizanidine  Review of Systems: Gen:  Denies  fever, sweats, chills HEENT: Denies blurred vision, double vision. bleeds, Cvc:  No dizziness, chest pain. Resp:   Denies cough or sputum production, shortness of breath Gi: Denies swallowing difficulty, stomach pain. Gu:  Denies bladder incontinence, burning urine Ext:   No Joint pain, stiffness. Skin: No skin rash,  hives  Endoc:  No polyuria, polydipsia. Psych: No depression, insomnia. Other:  All other systems were reviewed with the patient and were negative other that what is mentioned in the HPI.   Physical Examination:   VS: There were no vitals  taken for this visit.  General Appearance: No distress  Neuro:without focal findings,  speech normal,  HEENT: PERRLA, EOM intact.   Pulmonary: Scattered bibasilar crackles heard posteriorly. CardiovascularNormal S1,S2.  No m/r/g.   Abdomen: Benign, Soft, non-tender. Renal:  No costovertebral tenderness  GU:  No performed at this time. Endoc: No evident thyromegaly, no signs of acromegaly. Skin:   warm, no rashes, no ecchymosis  Extremities: normal, no cyanosis, clubbing.  Other findings:    LABORATORY PANEL:   CBC No results for input(s): WBC, HGB, HCT, PLT in the last 168 hours. ------------------------------------------------------------------------------------------------------------------  Chemistries  No results for input(s): NA, K, CL, CO2, GLUCOSE, BUN, CREATININE, CALCIUM, MG, AST, ALT, ALKPHOS, BILITOT in the last 168 hours.  Invalid input(s): GFRCGP ------------------------------------------------------------------------------------------------------------------  Cardiac Enzymes No results for input(s): TROPONINI in the last  168 hours. ------------------------------------------------------------  RADIOLOGY:  US Venous Img Lower Unilateral Right  Result Date: 10/30/2016 CLINICAL DATA:  Patient with pain behind the right knee for 5 days. EXAM: RIGHT LOWER EXTREMITY VENOUS DOPPLER ULTRASOUND TECHNIQUE: Gray-scale sonography with graded compression, as well as color Doppler and duplex ultrasound were performed to evaluate the lower extremity deep venous systems from the level of the common femoral vein and including the common femoral, femoral, profunda femoral, popliteal and calf veins including the posterior tibial, peroneal and gastrocnemius veins when visible. The superficial great saphenous vein was also interrogated. Spectral Doppler was utilized to evaluate flow at rest and with distal augmentation maneuvers in the common femoral, femoral and popliteal veins. COMPARISON:  None. FINDINGS: Contralateral Common Femoral Vein: Respiratory phasicity is normal and symmetric with the symptomatic side. No evidence of thrombus. Normal compressibility. Common Femoral Vein: No evidence of thrombus. Normal compressibility, respiratory phasicity and response to augmentation. Saphenofemoral Junction: No evidence of thrombus. Normal compressibility and flow on color Doppler imaging. Profunda Femoral Vein: No evidence of thrombus. Normal compressibility and flow on color Doppler imaging. Femoral Vein: No evidence of thrombus. Normal compressibility, respiratory phasicity and response to augmentation. Popliteal Vein: No evidence of thrombus. Normal compressibility, respiratory phasicity and response to augmentation. Calf Veins: No evidence of thrombus. Normal compressibility and flow on color Doppler imaging. Superficial Great Saphenous Vein: No evidence of thrombus. Normal compressibility and flow on color Doppler imaging. Venous Reflux:  None. Other Findings: Within the popliteal fossa there is a 3.4 x 1.3 x 3.6 cm cystic area, suggestive of  Bakers cyst. IMPRESSION: No evidence of deep venous thrombosis. 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 Electronically Signed   By: Lovey Newcomer M.D.   On: 10/30/2016 14:44       Thank  you for the consultation and for allowing [facility] to assist in the care of your patient. Our recommendations are noted above.  Please contact us if we can be of further service.   Marda Stalker, MD.  Board Certified in Internal Medicine, Pulmonary Medicine, [place], and Sleep Medicine.  [facility] Office Number: [phone]  Patricia Pesa, M.D.  Vilinda Boehringer, M.D.  Merton Border, M.D  10/31/2016

## 2016-11-01 ENCOUNTER — Ambulatory Visit (INDEPENDENT_AMBULATORY_CARE_PROVIDER_SITE_OTHER): Payer: Medicare Other | Admitting: Internal Medicine

## 2016-11-01 ENCOUNTER — Encounter: Payer: Self-pay | Admitting: Internal Medicine

## 2016-11-01 VITALS — BP 136/76 | HR 91 | Ht 64.0 in | Wt 163.0 lb

## 2016-11-01 DIAGNOSIS — I6529 Occlusion and stenosis of unspecified carotid artery: Secondary | ICD-10-CM | POA: Diagnosis not present

## 2016-11-01 DIAGNOSIS — R911 Solitary pulmonary nodule: Secondary | ICD-10-CM | POA: Diagnosis not present

## 2016-11-01 DIAGNOSIS — R1310 Dysphagia, unspecified: Secondary | ICD-10-CM | POA: Diagnosis not present

## 2016-11-01 DIAGNOSIS — J42 Unspecified chronic bronchitis: Secondary | ICD-10-CM | POA: Diagnosis not present

## 2016-11-01 MED ORDER — LANSOPRAZOLE 15 MG PO CPDR: 15.0000 mg | DELAYED_RELEASE_CAPSULE | Freq: Every day | ORAL | 1 refills | Status: DC

## 2016-11-01 NOTE — Addendum Note (Signed)
Addended by: Maryanna Shape A on: 2020-07-2417 12:31 PM   Modules accepted: Orders

## 2016-11-01 NOTE — Patient Instructions (Addendum)
--  Lansoprazole 15 mg once daily.  --Refer to Dr. Vicente Males for esophageal narrowing.

## 2016-11-10 DIAGNOSIS — H04223 Epiphora due to insufficient drainage, bilateral lacrimal glands: Secondary | ICD-10-CM | POA: Diagnosis not present

## 2016-11-21 ENCOUNTER — Telehealth: Payer: Self-pay | Admitting: Cardiovascular Disease

## 2016-11-21 ENCOUNTER — Other Ambulatory Visit: Payer: Self-pay

## 2016-11-21 DIAGNOSIS — H04223 Epiphora due to insufficient drainage, bilateral lacrimal glands: Secondary | ICD-10-CM | POA: Diagnosis not present

## 2016-11-21 MED ORDER — HYDROCHLOROTHIAZIDE 12.5 MG PO CAPS: 12.5000 mg | ORAL_CAPSULE | Freq: Every day | ORAL | 5 refills | Status: DC

## 2016-11-21 NOTE — Telephone Encounter (Signed)
Pt requested HCTZ refill which was originally prescribed by cardiologist in [place]. Last filled 11/17, 90 days. Medication was listed at Dec 2017 OV with Dr. Fletcher Anon; MD aware. Routed to Dr. Fletcher Anon for approval to refill as prescribed.

## 2016-11-21 NOTE — Telephone Encounter (Signed)
Awaiting MD response.

## 2016-11-21 NOTE — Telephone Encounter (Signed)
Please advise if ok to refill since he is not prescriber. Thanks.

## 2016-11-21 NOTE — Telephone Encounter (Signed)
Left message on machine for patient to contact the office.  Refills submitted to [facility]

## 2016-11-21 NOTE — Telephone Encounter (Signed)
That's fine

## 2016-11-23 ENCOUNTER — Encounter: Payer: Self-pay | Admitting: Gastroenterology

## 2016-11-23 ENCOUNTER — Ambulatory Visit (INDEPENDENT_AMBULATORY_CARE_PROVIDER_SITE_OTHER): Payer: Medicare Other | Admitting: Gastroenterology

## 2016-11-23 VITALS — BP 132/74 | HR 74 | Ht 64.0 in | Wt 160.0 lb

## 2016-11-23 DIAGNOSIS — R1310 Dysphagia, unspecified: Secondary | ICD-10-CM

## 2016-11-23 DIAGNOSIS — I6529 Occlusion and stenosis of unspecified carotid artery: Secondary | ICD-10-CM

## 2016-11-23 DIAGNOSIS — R1319 Other dysphagia: Secondary | ICD-10-CM

## 2016-11-23 DIAGNOSIS — K219 Gastro-esophageal reflux disease without esophagitis: Secondary | ICD-10-CM | POA: Diagnosis not present

## 2016-11-23 MED ORDER — RANITIDINE HCL 150 MG PO TABS: 150.0000 mg | ORAL_TABLET | Freq: Every day | ORAL | 1 refills | Status: DC

## 2016-11-23 NOTE — Patient Instructions (Signed)
Gastroesophageal Reflux Disease, Adult Introduction Normally, food travels down the esophagus and stays in the stomach to be digested. If a person has gastroesophageal reflux disease (GERD), food and stomach acid move back up into the esophagus. When this happens, the esophagus becomes sore and swollen (inflamed). Over time, GERD can make small holes (ulcers) in the lining of the esophagus. Follow these instructions at home: Diet  Follow a diet as told by your doctor. You may need to avoid foods and drinks such as:  Coffee and tea (with or without caffeine).  Drinks that contain alcohol.  Energy drinks and sports drinks.  Carbonated drinks or sodas.  Chocolate and cocoa.  Peppermint and mint flavorings.  Garlic and onions.  Horseradish.  Spicy and acidic foods, such as peppers, chili powder, curry powder, vinegar, hot sauces, and BBQ sauce.  Citrus fruit juices and citrus fruits, such as oranges, lemons, and limes.  Tomato-based foods, such as red sauce, chili, salsa, and pizza with red sauce.  Fried and fatty foods, such as donuts, french fries, potato chips, and high-fat dressings.  High-fat meats, such as hot dogs, rib eye steak, sausage, ham, and bacon.  High-fat dairy items, such as whole milk, butter, and cream cheese.  Eat small meals often. Avoid eating large meals.  Avoid drinking large amounts of liquid with your meals.  Avoid eating meals during the 2-3 hours before bedtime.  Avoid lying down right after you eat.  Do not exercise right after you eat. General instructions  Pay attention to any changes in your symptoms.  Take over-the-counter and prescription medicines only as told by your doctor. Do not take aspirin, ibuprofen, or other NSAIDs unless your doctor says it is okay.  Do not use any tobacco products, including cigarettes, chewing tobacco, and e-cigarettes. If you need help quitting, ask your doctor.  Wear loose clothes. Do not wear anything  tight around your waist.  Raise (elevate) the head of your bed about 6 inches (15 cm).  Try to lower your stress. If you need help doing this, ask your doctor.  If you are overweight, lose an amount of weight that is healthy for you. Ask your doctor about a safe weight loss goal.  Keep all follow-up visits as told by your doctor. This is important. Contact a doctor if:  You have new symptoms.  You lose weight and you do not know why it is happening.  You have trouble swallowing, or it hurts to swallow.  You have wheezing or a cough that keeps happening.  Your symptoms do not get better with treatment.  You have a hoarse voice. Get help right away if:  You have pain in your arms, neck, jaw, teeth, or back.  You feel sweaty, dizzy, or light-headed.  You have chest pain or shortness of breath.  You throw up (vomit) and your throw up looks like blood or coffee grounds.  You pass out (faint).  Your poop (stool) is bloody or black.  You cannot swallow, drink, or eat. This information is not intended to replace advice given to you by your health care provider. Make sure you discuss any questions you have with your health care provider. Document Released: 03/07/2008 Document Revised: 02/25/2016 Document Reviewed: 01/14/2015  2017 Elsevier  

## 2016-11-23 NOTE — Progress Notes (Signed)
Gastroenterology Consultation  Referring Provider:   Dr Isidore Moos Primary Care Physician:  Tommi Rumps, MD Primary Gastroenterologist:  Dr. Jonathon Bellows  Reason for Consultation:     Dysphagia         HPI:   Isabel Hernandez is a 76 y.o. y/o female . She has been referred by Dr Isidore Moos for dysphagia. She has a history of dysphagia and possible aspiration Pneumonitis. 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 She was seen by Dr Allen Norris in 2017 September for chest pain which he felt were due to esophageal spasms, he increased the dose of her PPI to twice a day .  Interval history 06/2016-11/2016  Dysphagia: Onset and any progression: many years Frequency: on and off , once every few days , last year prior to her EGD says she almost choked . Denies any dry mouth  Foods affected : Both -even water can get stuck  Prior episodes of impaction: no  History of asthma/allergy : pollen  History of heartburn/Reflux : yes  Weight loss : no , may have gained some weight recently .   Prior EGD: yes  PPI/H2 blocker use : Was on PPI previously - "felt weird and funny " and didn't take any . Sometimes feels like she acid in her mouth .  She presently takes lansoprazole with food.   She goes to bed at around 9.30 pm , she has her dinner at around 6-6.30 pm , in between sitting up .   Past Medical History:  Diagnosis Date  . A-fib ([place])   . Arthritis   . Breast cancer ([place]) 2002   RT LUMPECTOMY  . Bronchitis   . Carotid artery narrowings   . 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 Carotid artery occlusion    RIGHT 50% BLOCKAGE  . Colon polyps   . Complication of anesthesia    shaking/ FOLLOWING LOCAL AT DENTIST , drop O2 sats TO 50% DURING COLONOSCOPY  . Coronary artery disease   . Diverticulosis   . Dysrhythmia   . Environmental allergies   . GERD (gastroesophageal reflux disease)   . History of hiatal hernia   .  Hyperlipidemia   . Hypertension   . IBS (irritable bowel syndrome)   . Mitral valve disorder   . Shortness of breath dyspnea   . Syncope and collapse   . UTI (lower urinary tract infection)     Past Surgical History:  Procedure Laterality Date  . APPENDECTOMY    . BREAST BIOPSY Right 2002   POS  . BREAST CYST ASPIRATION Right   . BREAST EXCISIONAL BIOPSY Left 1997   NEG  . CATARACT EXTRACTION W/PHACO Right 06/07/2016   Procedure: CATARACT EXTRACTION PHACO AND INTRAOCULAR LENS PLACEMENT (IOC);  Surgeon: Birder Robson, MD;  Location: [facility] ORS;  Service: Ophthalmology;  Laterality: Right;  [place] 00:51 AP% 19.6 CDE 10.09 Fluid pack lot # [id number]  . CATARACT EXTRACTION W/PHACO Left 07/05/2016   Procedure: CATARACT EXTRACTION PHACO AND INTRAOCULAR LENS PLACEMENT (IOC);  Surgeon: Birder Robson, MD;  Location: [facility] ORS;  Service: Ophthalmology;  Laterality: Left;  [place] 00:38 AP% 24.9 CDE 9.60 Fluid Pack lot # [id number]  . CESAREAN SECTION    . TONSILLECTOMY    . TOTAL KNEE ARTHROPLASTY Left     Prior to Admission medications   Medication Sig Start Date End Date Taking? Authorizing Provider  amLODipine (NORVASC) 5 MG tablet Take  1 tablet (5 mg total) by mouth daily. 03/08/16  Yes Wellington Hampshire, MD  aspirin 81 MG tablet Take 81 mg by mouth daily.   Yes Historical Provider, MD  atorvastatin (LIPITOR) 20 MG tablet Take 20 mg by mouth daily.  11/13/15  Yes Historical Provider, MD  Calcium Carbonate (CALTRATE 600 PO) Take 1 tablet by mouth 2 (two) times daily.   Yes Historical Provider, MD  fenofibrate (TRICOR) 145 MG tablet Take 145 mg by mouth daily.  11/13/15  Yes Historical Provider, MD  hydrochlorothiazide (MICROZIDE) 12.5 MG capsule Take 1 capsule (12.5 mg total) by mouth daily. 11/21/16  Yes Wellington Hampshire, MD  lansoprazole (PREVACID) 15 MG capsule Take 1 capsule (15 mg total) by mouth daily. 11/01/16 11/01/17 Yes Laverle Hobby, MD  losartan (COZAAR) 50 MG tablet Take 50 mg by  mouth daily.  01/21/16  Yes Historical Provider, MD  metoprolol tartrate (LOPRESSOR) 25 MG tablet Take 25 mg by mouth 2 (two) times daily.  11/13/15  Yes Historical Provider, MD  Multiple Vitamin (MULTIVITAMIN WITH MINERALS) TABS tablet Take 1 tablet by mouth daily.   Yes Historical Provider, MD  Omega-3 Fatty Acids (FISH OIL) 1000 MG CAPS Take by mouth.   Yes Historical Provider, MD    Family History  Problem Relation Age of Onset  . Lung cancer Father   . Prostate cancer Father   . Hyperlipidemia Brother   . Heart disease Mother   . Lung cancer Sister   . Hypertension Brother   . Kidney disease      Maternal grandparent  . Diabetes Brother   . Breast cancer Neg Hx      Social History  Substance Use Topics  . Smoking status: Former Smoker    Years: 10.00    Types: Cigarettes  . Smokeless tobacco: Never Used  . Alcohol use 0.0 oz/week     Comment: couple drinks/week    Allergies as of 11/23/2016 - Review Complete 11/23/2016  Allergen Reaction Noted  . Biaxin [clarithromycin]  02/09/2016  . Latex  02/09/2016  . Nickel  06/07/2016  . Nitroglycerin  02/09/2016  . Oxycodone  02/09/2016  . Penicillins  02/09/2016  . Tetracyclines & related  02/09/2016  . Tizanidine  02/09/2016    Review of Systems:    All systems reviewed and negative except where noted in HPI.   Physical Exam:  BP 132/74   Pulse 74   Ht 5\' 4"  (1.626 m)   Wt 160 lb (72.6 kg)   BMI 27.46 kg/m  No LMP recorded. Patient is postmenopausal. Psych:  Alert and cooperative. Normal mood and affect. General:   Alert,  Well-developed, well-nourished, pleasant and cooperative in NAD Head:  Normocephalic and atraumatic. Eyes:  Sclera clear, no icterus.   Conjunctiva pink. Ears:  Normal auditory acuity. Nose:  No deformity, discharge, or lesions. Mouth:  No deformity or lesions,oropharynx pink & moist. Neck:  Supple; no masses or thyromegaly. Lungs:  Respirations even and unlabored.  Clear throughout to  auscultation.   No wheezes, crackles, or rhonchi. No acute distress. Heart:  Regular rate and rhythm; no murmurs, clicks, rubs, or gallops. Abdomen:  Normal bowel sounds.  No bruits.  Soft, non-tender and non-distended without masses, hepatosplenomegaly or hernias noted.  No guarding or rebound tenderness.    Msk:  Symmetrical without gross deformities. Good, equal movement & strength bilaterally. Pulses:  Normal pulses noted. Extremities:  No clubbing or edema.  No cyanosis. Neurologic:  Alert and oriented x3;  grossly normal neurologically. Skin:  Intact without significant lesions or rashes. No jaundice. Lymph Nodes:  No significant cervical adenopathy. Psych:  Alert and cooperative. Normal mood and affect.  Imaging Studies: US Venous Img Lower Unilateral Right  Result Date: 10/30/2016 CLINICAL DATA:  Patient with pain behind the right knee for 5 days. EXAM: RIGHT LOWER EXTREMITY VENOUS DOPPLER ULTRASOUND TECHNIQUE: Gray-scale sonography with graded compression, as well as color Doppler and duplex ultrasound were performed to evaluate the lower extremity deep venous systems from the level of the common femoral vein and including the common femoral, femoral, profunda femoral, popliteal and calf veins including the posterior tibial, peroneal and gastrocnemius veins when visible. The superficial great saphenous vein was also interrogated. Spectral Doppler was utilized to evaluate flow at rest and with distal augmentation maneuvers in the common femoral, femoral and popliteal veins. COMPARISON:  None. FINDINGS: Contralateral Common Femoral Vein: Respiratory phasicity is normal and symmetric with the symptomatic side. No evidence of thrombus. Normal compressibility. Common Femoral Vein: No evidence of thrombus. Normal compressibility, respiratory phasicity and response to augmentation. Saphenofemoral Junction: No evidence of thrombus. Normal compressibility and flow on color Doppler imaging. Profunda  Femoral Vein: No evidence of thrombus. Normal compressibility and flow on color Doppler imaging. Femoral Vein: No evidence of thrombus. Normal compressibility, respiratory phasicity and response to augmentation. Popliteal Vein: No evidence of thrombus. Normal compressibility, respiratory phasicity and response to augmentation. Calf Veins: No evidence of thrombus. Normal compressibility and flow on color Doppler imaging. Superficial Great Saphenous Vein: No evidence of thrombus. Normal compressibility and flow on color Doppler imaging. Venous Reflux:  None. Other Findings: Within the popliteal fossa there is a 3.4 x 1.3 x 3.6 cm cystic area, suggestive of Bakers cyst. IMPRESSION: No evidence of deep venous thrombosis. 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 Electronically Signed   By: Lovey Newcomer M.D.   On: 10/30/2016 14:44    Assessment and Plan:   Isabel Hernandez 76 y.o. female has been referred for dysphagia and GERD. She has been prescribed PPI in the past but has not been consistently taking it. She has presently been prescribed lanzoprazole which I advised to take daily 30 mins before her breakfast. For nocturnal breakthrough , suggested Zantac 150 mg Qhs. If treatment to reflux does not resolve her dysphagia, the next step would be endoscopy to check for formation of a schatzkis ring yet again followed by esophageal manometry to r/o achalasia . Her history is more suggestive of esophageal dysmotility/spasm.   Counseled on life style changes, suggest to use PPI first thing in the morning on empty stomach and eat 30 minutes after. Advised on the use of a wedge pillow at night , avoid meals for 2 hours prior to bed time. Weight loss .  Follow up in  46 weeks     Dr Jonathon Bellows MD

## 2016-11-25 ENCOUNTER — Ambulatory Visit (INDEPENDENT_AMBULATORY_CARE_PROVIDER_SITE_OTHER): Payer: Medicare Other | Admitting: Family Medicine

## 2016-11-25 ENCOUNTER — Encounter: Payer: Self-pay | Admitting: Family Medicine

## 2016-11-25 VITALS — BP 118/70 | HR 76 | Temp 98.1°F | Wt 160.0 lb

## 2016-11-25 DIAGNOSIS — R0609 Other forms of dyspnea: Secondary | ICD-10-CM

## 2016-11-25 DIAGNOSIS — J189 Pneumonia, unspecified organism: Secondary | ICD-10-CM | POA: Diagnosis not present

## 2016-11-25 DIAGNOSIS — M25551 Pain in right hip: Secondary | ICD-10-CM

## 2016-11-25 DIAGNOSIS — E785 Hyperlipidemia, unspecified: Secondary | ICD-10-CM | POA: Diagnosis not present

## 2016-11-25 DIAGNOSIS — I6529 Occlusion and stenosis of unspecified carotid artery: Secondary | ICD-10-CM

## 2016-11-25 DIAGNOSIS — I10 Essential (primary) hypertension: Secondary | ICD-10-CM | POA: Diagnosis not present

## 2016-11-25 DIAGNOSIS — G8929 Other chronic pain: Secondary | ICD-10-CM | POA: Diagnosis not present

## 2016-11-25 LAB — COMPREHENSIVE METABOLIC PANEL
ALK PHOS: 32 U/L — AB (ref 39–117)
ALT: 21 U/L (ref 0–35)
AST: 39 U/L — AB (ref 0–37)
Albumin: 4.4 g/dL (ref 3.5–5.2)
BILIRUBIN TOTAL: 1.1 mg/dL (ref 0.2–1.2)
BUN: 16 mg/dL (ref 6–23)
CALCIUM: 9.8 mg/dL (ref 8.4–10.5)
CO2: 29 mEq/L (ref 19–32)
CREATININE: 1.05 mg/dL (ref 0.40–1.20)
Chloride: 102 mEq/L (ref 96–112)
GFR: 54.01 mL/min — AB (ref >60.00)
Glucose, Bld: 138 mg/dL — ABNORMAL HIGH (ref 70–99)
Potassium: 3.7 mEq/L (ref 3.5–5.1)
Sodium: 138 mEq/L (ref 135–145)
TOTAL PROTEIN: 7 g/dL (ref 6.0–8.3)

## 2016-11-25 LAB — CBC
HCT: 39.3 % (ref 36.0–46.0)
Hemoglobin: 13.2 g/dL (ref 12.0–15.0)
MCHC: 33.7 g/dL (ref 30.0–36.0)
MCV: 99.2 fl (ref 78.0–100.0)
PLATELETS: 150 10*3/uL (ref 150.0–400.0)
RBC: 3.96 Mil/uL (ref 3.87–5.11)
RDW: 13.5 % (ref 11.5–15.5)
WBC: 6.3 10*3/uL (ref 4.0–10.5)

## 2016-11-25 NOTE — Patient Instructions (Signed)
Nice to see you. We'll check some lab work today and contact you with the results. We will refer you to sports medicine for your hip. You monitor your breathing and if this worsens or he develop any new symptoms please seek medical attention.

## 2016-11-25 NOTE — Progress Notes (Signed)
  Tommi Rumps, MD Phone: [phone]  Isabel Hernandez is a 76 y.o. female who presents today for f/u.  HYPERTENSION  Disease Monitoring  Home BP Monitoring not checking Chest pain- no    Dyspnea- yes, see below Medications  Compliance-  Taking amlodipine, HCTZ, losartan, metoprolol  Edema- no  Pneumonitis: Patient has been evaluated by cardiology and pulmonary for her shortness of breath. Cardiac workup has been unremarkable. Pulmonology has felt that her shortness of breath is related to aspiration pneumonitis. She has been placed on lansoprazole and then saw GI who added Zantac. They're planning on seeing if her symptoms improve with this regimen and if not doing any EGD. She notes minimal cough though she did have pneumonia in January. She notes she hasn't quite gotten her strength fully back from this illness. She's not had any fevers. She was treated with prednisone and antibiotics.  Patient notes chronic right hip pain when she walks. Notes no injury. Only hurts her when she walks. She notes she ends up limping due to this. She had an x-ray previously that did not reveal any arthritis.  Hyperlipidemia: Taking Lipitor and fenofibrate. No right upper quadrant pain or myalgias.  PMH: Former smoker   ROS see history of present illness  Objective  Physical Exam Vitals:   11/25/16 1324  BP: 118/70  Pulse: 76  Temp: 98.1 F (36.7 C)    BP Readings from Last 3 Encounters:  11/25/16 118/70  11/23/16 132/74  11/01/16 136/76   Wt Readings from Last 3 Encounters:  11/25/16 160 lb (72.6 kg)  11/23/16 160 lb (72.6 kg)  11/01/16 163 lb (73.9 kg)    Physical Exam  Constitutional: No distress.  Cardiovascular: Normal rate, regular rhythm and normal heart sounds.   Pulmonary/Chest: Effort normal and breath sounds normal.  Musculoskeletal:  Full range of motion right hip with no pain, no tenderness to the hip laterally or anteriorly  Neurological: She is alert. Gait normal.    Skin: Skin is warm and dry. She is not diaphoretic.     Assessment/Plan: Please see individual problem list.  Pneumonitis Patient has been evaluated for shortness of breath by cardiology and pulmonology. It has been felt to be related to aspiration pneumonitis from reflux. We will repeat her CBC. She will continue on reflux treatment and follow-up with GI as scheduled. She is given return precautions.  Right hip pain Benign exam. Prior x-ray unremarkable. We'll refer to sports medicine for further evaluation given persistence.  Hyperlipidemia Tolerating medications. Continue Lipitor and fenofibrate.  Essential hypertension At goal. Continue current medications. Check CMP.   Orders Placed This Encounter  Procedures  . Comp Met (CMET)  . CBC  . Ambulatory referral to Sports Medicine    Referral Priority:   Routine    Referral Type:   Consultation    Number of Visits Requested:   [place], MD [place]

## 2016-11-25 NOTE — Assessment & Plan Note (Signed)
Benign exam. Prior x-ray unremarkable. We'll refer to sports medicine for further evaluation given persistence.

## 2016-11-25 NOTE — Progress Notes (Signed)
Pre visit review using our clinic review tool, if applicable. No additional management support is needed unless otherwise documented below in the visit note. 

## 2016-11-25 NOTE — Assessment & Plan Note (Addendum)
At goal. Continue current medications. Check CMP. 

## 2016-11-25 NOTE — Assessment & Plan Note (Signed)
Tolerating medications. Continue Lipitor and fenofibrate.

## 2016-11-25 NOTE — Assessment & Plan Note (Signed)
Patient has been evaluated for shortness of breath by cardiology and pulmonology. It has been felt to be related to aspiration pneumonitis from reflux. We will repeat her CBC. She will continue on reflux treatment and follow-up with GI as scheduled. She is given return precautions.

## 2016-11-28 ENCOUNTER — Encounter: Payer: Self-pay | Admitting: Family Medicine

## 2016-12-02 DIAGNOSIS — L300 Nummular dermatitis: Secondary | ICD-10-CM | POA: Diagnosis not present

## 2016-12-02 DIAGNOSIS — L538 Other specified erythematous conditions: Secondary | ICD-10-CM | POA: Diagnosis not present

## 2016-12-02 DIAGNOSIS — L820 Inflamed seborrheic keratosis: Secondary | ICD-10-CM | POA: Diagnosis not present

## 2016-12-03 ENCOUNTER — Other Ambulatory Visit: Payer: Self-pay | Admitting: Family Medicine

## 2016-12-03 DIAGNOSIS — R945 Abnormal results of liver function studies: Principal | ICD-10-CM

## 2016-12-03 DIAGNOSIS — R7309 Other abnormal glucose: Secondary | ICD-10-CM

## 2016-12-03 DIAGNOSIS — R7989 Other specified abnormal findings of blood chemistry: Secondary | ICD-10-CM

## 2016-12-06 DIAGNOSIS — H6122 Impacted cerumen, left ear: Secondary | ICD-10-CM | POA: Diagnosis not present

## 2016-12-06 DIAGNOSIS — H9202 Otalgia, left ear: Secondary | ICD-10-CM | POA: Diagnosis not present

## 2016-12-09 ENCOUNTER — Telehealth: Payer: Self-pay | Admitting: Cardiovascular Disease

## 2016-12-09 ENCOUNTER — Other Ambulatory Visit: Payer: Self-pay | Admitting: Dermatopathology

## 2016-12-09 MED ORDER — LOSARTAN POTASSIUM 50 MG PO TABS: 50.0000 mg | ORAL_TABLET | Freq: Every day | ORAL | 1 refills | Status: DC

## 2016-12-09 NOTE — Telephone Encounter (Signed)
°*  STAT* If patient is at the pharmacy, call can be transferred to refill team.   1. Which medications need to be refilled? (please list name of each medication and dose if known) Losartin 50 mg po daily   2. Which pharmacy/location (including street and city if local pharmacy) is medication to be sent to?  [facility] [place]      3. Do they need a 30 day or 90 day supply?  [place]

## 2016-12-09 NOTE — Telephone Encounter (Signed)
Pt transferred care from [facility] cardiologist to Dr. Fletcher Anon June 2017. Last OV Dec 2017; he noted BP controlled with current medications. He has refilled other cardiac medications.

## 2016-12-09 NOTE — Telephone Encounter (Signed)
Losartan 50 mg tablet refill request, Ok to refill? Arida not original Programmer, systems

## 2016-12-11 NOTE — Progress Notes (Deleted)
[facility] [place], [place] [address] Phone: [phone] Subjective:    I'm seeing this patient by the request  of:  Tommi Rumps, MD   CC: right hip pain  HYI:[id number]  Isabel Hernandez is a 76 y.o. female coming in with complaint of right hip pain. Past medical history is significant for history of breast cancer, atrial fibrillationbut is not on any blood thinners,as well as left-sided knee replacement.patient states that she has had this pain for many months of years. Only hurts her when she does activity. Patient states it does hurt her when she walks. Patient is that she unfortunately has to limp secondary to the pain. Patient does not remember any true injury. Patient has used anti-inflammatories but now tries to avoid them when possible.   patient did have previous imaging. Patient did have a right hip x-ray done 08/19/2016 that was independently visualized by me.seems to be unremarkable. 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  Past Medical History:  Diagnosis Date  . A-fib ([place])   . Arthritis   . Breast cancer ([place]) 2002   RT LUMPECTOMY  . Bronchitis   . Carotid artery narrowings   . 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 Carotid artery occlusion    RIGHT 50% BLOCKAGE  . Colon polyps   . Complication of anesthesia    shaking/ FOLLOWING LOCAL AT DENTIST , drop O2 sats TO 50% DURING COLONOSCOPY  . Coronary artery disease   . Diverticulosis   . Dysrhythmia   . Environmental allergies   . GERD (gastroesophageal reflux disease)   . History of hiatal hernia   . Hyperlipidemia   . Hypertension   . IBS (irritable bowel syndrome)   . Mitral valve disorder   . Shortness of breath dyspnea   . Syncope and collapse   . UTI (lower urinary tract infection)    Past Surgical History:  Procedure Laterality Date  . APPENDECTOMY    . BREAST BIOPSY Right 2002   POS  . BREAST CYST ASPIRATION Right   .  BREAST EXCISIONAL BIOPSY Left 1997   NEG  . CATARACT EXTRACTION W/PHACO Right 06/07/2016   Procedure: CATARACT EXTRACTION PHACO AND INTRAOCULAR LENS PLACEMENT (IOC);  Surgeon: Birder Robson, MD;  Location: [facility] ORS;  Service: Ophthalmology;  Laterality: Right;  [place] 00:51 AP% 19.6 CDE 10.09 Fluid pack lot # [id number]  . CATARACT EXTRACTION W/PHACO Left 07/05/2016   Procedure: CATARACT EXTRACTION PHACO AND INTRAOCULAR LENS PLACEMENT (IOC);  Surgeon: Birder Robson, MD;  Location: [facility] ORS;  Service: Ophthalmology;  Laterality: Left;  [place] 00:38 AP% 24.9 CDE 9.60 Fluid Pack lot # [id number]  . CESAREAN SECTION    . TONSILLECTOMY    . TOTAL KNEE ARTHROPLASTY Left    Social History   Social History  . Marital status: Married    Spouse name: N/A  . Number of children: N/A  . Years of education: N/A   Social History Main Topics  . Smoking status: Former Smoker    Years: 10.00    Types: Cigarettes  . Smokeless tobacco: Never Used  . Alcohol use 0.0 oz/week     Comment: couple drinks/week  . Drug use: No  . Sexual activity: Not on file   Other Topics Concern  . Not on file   Social History Narrative  . No narrative on file   Allergies  Allergen Reactions  . Biaxin [Clarithromycin]  Rash   . Latex     Rash   . Lidocaine Swelling    Eye swelling  . Nickel   . Nitroglycerin     Can only use the patch   . Oxycodone     Hives  . Penicillins     Hives Has patient had a PCN reaction causing immediate rash, facial/tongue/throat swelling, SOB or lightheadedness with hypotension: YES Has patient had a PCN reaction causing severe rash involving mucus membranes or skin necrosis: NO Has patient had a PCN reaction that required hospitalization NO Has patient had a PCN reaction occurring within the last 10 years: No If all of the above answers are "NO", then may proceed with Cephalosporin use.  . Tetracyclines & Related      Rash   . Tizanidine     Rash    Family History    Problem Relation Age of Onset  . Lung cancer Father   . Prostate cancer Father   . Hyperlipidemia Brother   . Heart disease Mother   . Lung cancer Sister   . Hypertension Brother   . Kidney disease      Maternal grandparent  . Diabetes Brother   . Breast cancer Neg Hx     Past medical history, social, surgical and family history all reviewed in electronic medical record.  No pertanent information unless stated regarding to the chief complaint.   Review of Systems:Review of systems updated and as accurate as of 12/11/16  No headache, visual changes, nausea, vomiting, diarrhea, constipation, dizziness, abdominal pain, skin rash, fevers, chills, night sweats, weight loss, swollen lymph nodes, body aches, joint swelling, muscle aches, chest pain, shortness of breath, mood changes.   Objective  There were no vitals taken for this visit. Systems examined below as of 12/11/16   General: No apparent distress alert and oriented x3 mood and affect normal, dressed appropriately.  HEENT: Pupils equal, extraocular movements intact  Respiratory: Patient's speak in full sentences and does not appear short of breath  Cardiovascular: No lower extremity edema, non tender, no erythema  Skin: Warm dry intact with no signs of infection or rash on extremities or on axial skeleton.  Abdomen: Soft nontender  Neuro: Cranial nerves II through XII are intact, neurovascularly intact in all extremities with 2+ DTRs and 2+ pulses.  Lymph: No lymphadenopathy of posterior or anterior cervical chain or axillae bilaterally.  Gait normal with good balance and coordination.  MSK:  Non tender with full range of motion and good stability and symmetric strength and tone of shoulders, elbows, wrist, , knee and ankles bilaterally. Previous left knee repair placement Hip: ROM IR: 25 Deg, ER: 45 Deg, Flexion: 120 Deg, Extension: 100 Deg, Abduction: 45 Deg, Adduction: 45 Deg Strength IR: 5/5, ER: 5/5, Flexion: 5/5,  Extension: 5/5, Abduction: 5/5, Adduction: 5/5 Pelvic alignment unremarkable to inspection and palpation. Standing hip rotation and gait without trendelenburg sign / unsteadiness. Greater trochanter without tenderness to palpation. No tenderness over piriformis and greater trochanter. No pain with FABER or FADIR. No SI joint tenderness and normal minimal SI movement.  Back Exam:  Inspection: Unremarkable  Motion: Flexion 45 deg, Extension 45 deg, Side Bending to 45 deg bilaterally,  Rotation to 45 deg bilaterally  SLR laying: Negative  XSLR laying: Negative  Palpable tenderness: None. FABER: negative. Sensory change: Gross sensation intact to all lumbar and sacral dermatomes.  Reflexes: 2+ at both patellar tendons, 2+ at achilles tendons, Babinski's downgoing.  Strength at foot  Plantar-flexion:  5/5 Dorsi-flexion: 5/5 Eversion: 5/5 Inversion: 5/5  Leg strength  Quad: 5/5 Hamstring: 5/5 Hip flexor: 5/5 Hip abductors: 5/5  Gait unremarkable.     Impression and Recommendations:     This case required medical decision making of moderate complexity.      Note: This dictation was prepared with Dragon dictation along with smaller phrase technology. Any transcriptional errors that result from this process are unintentional.

## 2016-12-12 ENCOUNTER — Ambulatory Visit: Payer: Medicare Other | Admitting: Family Medicine

## 2016-12-15 ENCOUNTER — Other Ambulatory Visit: Payer: Medicare Other

## 2016-12-16 ENCOUNTER — Other Ambulatory Visit (INDEPENDENT_AMBULATORY_CARE_PROVIDER_SITE_OTHER): Payer: Medicare Other

## 2016-12-16 DIAGNOSIS — R7989 Other specified abnormal findings of blood chemistry: Secondary | ICD-10-CM | POA: Diagnosis not present

## 2016-12-16 DIAGNOSIS — R7309 Other abnormal glucose: Secondary | ICD-10-CM | POA: Diagnosis not present

## 2016-12-16 DIAGNOSIS — R945 Abnormal results of liver function studies: Principal | ICD-10-CM

## 2016-12-16 DIAGNOSIS — R911 Solitary pulmonary nodule: Secondary | ICD-10-CM

## 2016-12-16 LAB — HEPATIC FUNCTION PANEL
ALBUMIN: 4.3 g/dL (ref 3.5–5.2)
ALT: 16 U/L (ref 0–35)
AST: 39 U/L — AB (ref 0–37)
Alkaline Phosphatase: 31 U/L — ABNORMAL LOW (ref 39–117)
BILIRUBIN TOTAL: 0.8 mg/dL (ref 0.2–1.2)
Bilirubin, Direct: 0.2 mg/dL (ref 0.0–0.3)
TOTAL PROTEIN: 6.8 g/dL (ref 6.0–8.3)

## 2016-12-16 LAB — HEMOGLOBIN A1C: HEMOGLOBIN A1C: 5.4 % (ref 4.6–6.5)

## 2016-12-20 ENCOUNTER — Telehealth: Payer: Self-pay | Admitting: Cardiovascular Disease

## 2016-12-20 NOTE — Telephone Encounter (Signed)
Will you please advise if okat to refill. Dr. Fletcher Anon is not original Prescriber

## 2016-12-20 NOTE — Telephone Encounter (Signed)
Error

## 2016-12-20 NOTE — Telephone Encounter (Signed)
°*  STAT* If patient is at the pharmacy, call can be transferred to refill team.   1. Which medications need to be refilled? (please list name of each medication and dose if known)   fenofibrate 145 mg  Atorvastatin 20 mg   2. Which pharmacy/location (including street and city if local pharmacy) is medication to be sent to?  [facility] [place]   3. Do they need a 30 day or 90 day supply?   90 day

## 2016-12-20 NOTE — Telephone Encounter (Signed)
Pt transferred care to Dr. Fletcher Anon Dec 2017 from [facility] cardiologist d/t move to [facility] [place]. He has approved refills for other cardiac medications.  Acceptable to refill fenofibrate and atorvastatin.

## 2016-12-21 ENCOUNTER — Other Ambulatory Visit: Payer: Self-pay | Admitting: Family Medicine

## 2016-12-21 ENCOUNTER — Other Ambulatory Visit: Payer: Self-pay

## 2016-12-21 DIAGNOSIS — R7989 Other specified abnormal findings of blood chemistry: Secondary | ICD-10-CM

## 2016-12-21 DIAGNOSIS — R945 Abnormal results of liver function studies: Principal | ICD-10-CM

## 2016-12-21 MED ORDER — ATORVASTATIN CALCIUM 20 MG PO TABS: 20.0000 mg | ORAL_TABLET | Freq: Every day | ORAL | 3 refills | Status: DC

## 2016-12-21 MED ORDER — FENOFIBRATE 145 MG PO TABS: 145.0000 mg | ORAL_TABLET | Freq: Every day | ORAL | 3 refills | Status: DC

## 2016-12-21 NOTE — Telephone Encounter (Signed)
Requested Prescriptions   Signed Prescriptions Disp Refills  . fenofibrate (TRICOR) 145 MG tablet 90 tablet 3    Sig: Take 1 tablet (145 mg total) by mouth daily.    Authorizing Provider: Kathlyn Sacramento A    Ordering User: Janan Ridge atorvastatin (LIPITOR) 20 MG tablet 90 tablet 3    Sig: Take 1 tablet (20 mg total) by mouth daily.    Authorizing Provider: Kathlyn [place] A    Ordering User: Janan Ridge    Refills sent.

## 2016-12-21 NOTE — Telephone Encounter (Signed)
Requested Prescriptions   Signed Prescriptions Disp Refills  . fenofibrate (TRICOR) 145 MG tablet 90 tablet 3    Sig: Take 1 tablet (145 mg total) by mouth daily.    Authorizing Provider: Kathlyn [place] A    Ordering User: Janan Ridge atorvastatin (LIPITOR) 20 MG tablet 90 tablet 3    Sig: Take 1 tablet (20 mg total) by mouth daily.    Authorizing Provider: Kathlyn [place] A    Ordering User: Janan Ridge

## 2016-12-26 NOTE — Progress Notes (Signed)
[facility] [address] [place], [place] [address] Phone: [phone] Subjective:    I'm seeing this patient by the request  of:  Tommi Rumps, MD   CC: right hip pain  TDD:[id number]  Isabel Hernandez is a 76 y.o. female coming in with complaint of right hip pain. Past medical history is significant for history of breast cancer, atrial fibrillation but is not on any blood thinners,as well as left-sided knee replacement.patient states that she has had this pain for many months of years. Only hurts her when she does activity. Patient states it does hurt her when she walks. Patient is that she unfortunately has to limp secondary to the pain. Patient does not remember any true injury. Patient has used anti-inflammatories but now tries to avoid them when possible.States that she was unable to even walk 2 blocks while she was in [place].   patient did have previous imaging. Patient did have a right hip x-ray done 08/19/2016 that was independently visualized by me.seems to be unremarkable. 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  Past Medical History:  Diagnosis Date  . A-fib ([place])   . Arthritis   . Breast cancer ([place]) 2002   RT LUMPECTOMY  . Bronchitis   . Carotid artery narrowings   . 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 Carotid artery occlusion    RIGHT 50% BLOCKAGE  . Colon polyps   . Complication of anesthesia    shaking/ FOLLOWING LOCAL AT DENTIST , drop O2 sats TO 50% DURING COLONOSCOPY  . Coronary artery disease   . Diverticulosis   . Dysrhythmia   . Environmental allergies   . GERD (gastroesophageal reflux disease)   . History of hiatal hernia   . Hyperlipidemia   . Hypertension   . IBS (irritable bowel syndrome)   . Mitral valve disorder   . Shortness of breath dyspnea   . Syncope and collapse   . UTI (lower urinary tract infection)    Past Surgical History:  Procedure Laterality Date  . APPENDECTOMY    .  BREAST BIOPSY Right 2002   POS  . BREAST CYST ASPIRATION Right   . BREAST EXCISIONAL BIOPSY Left 1997   NEG  . CATARACT EXTRACTION W/PHACO Right 06/07/2016   Procedure: CATARACT EXTRACTION PHACO AND INTRAOCULAR LENS PLACEMENT (IOC);  Surgeon: Birder Robson, MD;  Location: [facility] ORS;  Service: Ophthalmology;  Laterality: Right;  [place] 00:51 AP% 19.6 CDE 10.09 Fluid pack lot # [id number]  . CATARACT EXTRACTION W/PHACO Left 07/05/2016   Procedure: CATARACT EXTRACTION PHACO AND INTRAOCULAR LENS PLACEMENT (IOC);  Surgeon: Birder Robson, MD;  Location: [facility] OR[facility];  Service: Ophthalmology;  Laterality: Left;  [place] 00:38 AP% 24.9 CDE 9.60 Fluid Pack lot # [id number]  . CESAREAN SECTION    . TONSILLECTOMY    . TOTAL KNEE ARTHROPLASTY Left    Social History   Social History  . Marital status: Married    Spouse name: N/A  . Number of children: N/A  . Years of education: N/A   Social History Main Topics  . Smoking status: Former Smoker    Years: 10.00    Types: Cigarettes  . Smokeless tobacco: Never Used  . Alcohol use 0.0 oz/week     Comment: couple drinks/week  . Drug use: No  . Sexual activity: Not Asked   Other Topics Concern  . None   Social History Narrative  . None   Allergies  Allergen Reactions  . Biaxin [Clarithromycin]     Rash   . Latex     Rash   . Lidocaine Swelling    Eye swelling  . Nickel   . Nitroglycerin     Can only use the patch   . Oxycodone     Hives  . Penicillins     Hives Has patient had a PCN reaction causing immediate rash, facial/tongue/throat swelling, SOB or lightheadedness with hypotension: YES Has patient had a PCN reaction causing severe rash involving mucus membranes or skin necrosis: NO Has patient had a PCN reaction that required hospitalization NO Has patient had a PCN reaction occurring within the last 10 years: No If all of the above answers are "NO", then may proceed with Cephalosporin use.  . Tetracyclines & Related      Rash   .  Tizanidine     Rash    Family History  Problem Relation Age of Onset  . Lung cancer Father   . Prostate cancer Father   . Hyperlipidemia Brother   . Heart disease Mother   . Lung cancer Sister   . Hypertension Brother   . Kidney disease      Maternal grandparent  . Diabetes Brother   . Breast cancer Neg Hx     Past medical history, social, surgical and family history all reviewed in electronic medical record.  No pertanent information unless stated regarding to the chief complaint.   Review of Systems:Review of systems updated and as accurate as of 12/27/16  No headache, visual changes, nausea, vomiting, diarrhea, constipation, dizziness, abdominal pain, skin rash, fevers, chills, night sweats, weight loss, swollen lymph nodes, body aches, joint swelling, chest pain, shortness of breath, mood changes. Positive muscle aches  Objective  Blood pressure 132/78, pulse 60, height 5\' 4"  (1.626 m), weight 160 lb (72.6 kg). Systems examined below as of 12/27/16   General: No apparent distress alert and oriented x3 mood and affect normal, dressed appropriately.  HEENT: Pupils equal, extraocular movements intact  Respiratory: Patient's speak in full sentences and does not appear short of breath  Cardiovascular: No lower extremity edema, non tender, no erythema  Skin: Warm dry intact with no signs of infection or rash on extremities or on axial skeleton.  Abdomen: Soft nontender  Neuro: Cranial nerves II through XII are intact, neurovascularly intact in all extremities with 2+ DTRs and 2+ pulses.  Lymph: No lymphadenopathy of posterior or anterior cervical chain or axillae bilaterally.  Gait normal with good balance and coordination.  MSK:  Non tender with full range of motion and good stability and symmetric strength and tone of shoulders, elbows, wrist, , knee and ankles bilaterally. Previous left knee repair placement DJS:[id number] ROM IR: 25 Deg, ER: 45 Deg, Flexion: 120 Deg, Extension: 100  Deg, Abduction: 45 Deg, Adduction: 45 Deg Strength IR: 5/5, ER: 5/5, Flexion: 5/5, Extension: 5/5, Abduction: 5/5, Adduction: 5/5 Pelvic alignment unremarkable to inspection and palpation. Standing hip rotation and gait without trendelenburg sign / unsteadiness. Greater trochanter without tenderness to palpation. No tenderness over piriformis and greater trochanter. No pain with FABER or FADIR. Mild pain over the right sacroiliac joint Patient does have a positive fulcrum test of the right leg  Back Exam:  Inspection: Unremarkable  Motion: Flexion 35 deg, Extension 15 deg, Side Bending to 45 deg bilaterally,  Rotation to 45 deg bilaterally  SLR laying: Negative  XSLR laying: Negative  Palpable tenderness: Tenderness of the sacroiliac joints bilaterally.Marland Kitchen FABER: negative.  Sensory change: Gross sensation intact to all lumbar and sacral dermatomes.  Reflexes: 2+ at both patellar tendons, 2+ at achilles tendons, Babinski's downgoing.  Strength at foot  4 out of 5 strength but symmetric  Gait unremarkable.     Impression and Recommendations:     This case required medical decision making of moderate complexity.      Note: This dictation was prepared with Dragon dictation along with smaller phrase technology. Any transcriptional errors that result from this process are unintentional.

## 2016-12-27 ENCOUNTER — Ambulatory Visit (INDEPENDENT_AMBULATORY_CARE_PROVIDER_SITE_OTHER): Payer: Medicare Other | Admitting: Family Medicine

## 2016-12-27 ENCOUNTER — Encounter: Payer: Self-pay | Admitting: Family Medicine

## 2016-12-27 DIAGNOSIS — I6529 Occlusion and stenosis of unspecified carotid artery: Secondary | ICD-10-CM

## 2016-12-27 DIAGNOSIS — M25551 Pain in right hip: Secondary | ICD-10-CM

## 2016-12-27 MED ORDER — VITAMIN D (ERGOCALCIFEROL) 1.25 MG (50000 UNIT) PO CAPS: 50000.0000 [IU] | ORAL_CAPSULE | ORAL | 0 refills | Status: DC

## 2016-12-27 NOTE — Assessment & Plan Note (Signed)
Patient did have x-rays that did not show any significant bony abnormality and doesn't very good range of motion. Patient does have a positive full test Concerned with patient having a small fracture likely stress fracture of the proximal femur. Patient's pain is more on the medial aspects and likely would heal with conservative therapy. We discussed with patient at great length. Patient will be on the once weekly vitamin D, discussed avoiding certain activities, we discussed icing regimen. Patient will also do compression sleeve. Follow-up again with me in 3-4 weeks. Worsening symptoms I do feel with patient's past medical history for breast cancer to get an MRI.

## 2016-12-27 NOTE — Patient Instructions (Signed)
Good to see you  Ice 20 minutes 2 times daily. Usually after activity and before bed. Once weekly vitamin D Can try biking or elliptical for now.  We will give it 3-6 weeks and see how we are doing and see if we need any further testing.

## 2017-01-02 ENCOUNTER — Encounter: Payer: Self-pay | Admitting: Gastroenterology

## 2017-01-02 ENCOUNTER — Ambulatory Visit (INDEPENDENT_AMBULATORY_CARE_PROVIDER_SITE_OTHER): Payer: Medicare Other | Admitting: Gastroenterology

## 2017-01-02 VITALS — BP 107/62 | HR 73 | Temp 97.8°F | Ht 64.0 in | Wt 160.4 lb

## 2017-01-02 DIAGNOSIS — R1310 Dysphagia, unspecified: Secondary | ICD-10-CM

## 2017-01-02 DIAGNOSIS — I6529 Occlusion and stenosis of unspecified carotid artery: Secondary | ICD-10-CM | POA: Diagnosis not present

## 2017-01-02 DIAGNOSIS — K219 Gastro-esophageal reflux disease without esophagitis: Secondary | ICD-10-CM

## 2017-01-02 DIAGNOSIS — R1319 Other dysphagia: Secondary | ICD-10-CM

## 2017-01-02 NOTE — Progress Notes (Signed)
Primary Care Physician: Tommi Rumps, MD  Primary Gastroenterologist:  Dr. Jonathon Bellows   Chief Complaint  Patient presents with  . Follow-up    HPI: Isabel Hernandez is a 76 y.o. female here to follow up to her last visit 11/2016 for dysphagia.   Summary of history: She has a history of dysphagia and possible aspiration Pneumonitis. 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 She was seen by Dr Allen Norris in 2017 September for chest pain which he felt were due to esophageal spasms, he increased the dose of her PPI to twice a day . Dysphagia affects solids and liquids  Interval history 11/2016-01/2017  She is taking lansoprazole daily, zantac at night and doing well . No issues with swallowing . Not eating for 2 hours before bedtime, using the wedge pillow  Current Outpatient Prescriptions  Medication Sig Dispense Refill  . omeprazole (PRILOSEC) 40 MG capsule Take by mouth.    Marland Kitchen amLODipine (NORVASC) 5 MG tablet Take 1 tablet (5 mg total) by mouth daily. 90 tablet 3  . aspirin 81 MG tablet Take 81 mg by mouth daily.    . Aspirin-Calcium Carbonate 81-777 MG TABS Take by mouth.    Marland Kitchen atorvastatin (LIPITOR) 20 MG tablet Take 1 tablet (20 mg total) by mouth daily. 90 tablet 3  . benzonatate (TESSALON) 200 MG capsule     . Calcium Carbonate (CALTRATE 600 PO) Take 1 tablet by mouth 2 (two) times daily.    . clindamycin (CLEOCIN) 150 MG capsule     . erythromycin ophthalmic ointment     . fenofibrate (TRICOR) 145 MG tablet Take 1 tablet (145 mg total) by mouth daily. 90 tablet 3  . hydrochlorothiazide (MICROZIDE) 12.5 MG capsule Take 1 capsule (12.5 mg total) by mouth daily. 30 capsule 5  . lansoprazole (PREVACID) 15 MG capsule Take 1 capsule (15 mg total) by mouth daily. 30 capsule 1  . levofloxacin (LEVAQUIN) 500 MG tablet     . losartan (COZAAR) 50 MG tablet Take 1 tablet (50 mg total) by mouth daily. 90 tablet 1  . metoprolol tartrate  (LOPRESSOR) 25 MG tablet Take 25 mg by mouth 2 (two) times daily.     . Multiple Vitamin (MULTIVITAMIN WITH MINERALS) TABS tablet Take 1 tablet by mouth daily.    . Omega-3 Fatty Acids (FISH OIL) 1000 MG CAPS Take by mouth.    . predniSONE (DELTASONE) 10 MG tablet     . predniSONE (DELTASONE) 50 MG tablet     . ranitidine (ZANTAC) 150 MG tablet Take 1 tablet (150 mg total) by mouth at bedtime. 30 tablet 1  . Vitamin D, Ergocalciferol, (DRISDOL) 50000 units CAPS capsule Take 1 capsule (50,000 Units total) by mouth every 7 (seven) days. 12 capsule 0   No current facility-administered medications for this visit.     Allergies as of 01/02/2017 - Review Complete 01/02/2017  Allergen Reaction Noted  . Biaxin [clarithromycin]  02/09/2016  . Latex  02/09/2016  . Lidocaine Swelling 11/25/2016  . Nickel  06/07/2016  . Nitroglycerin  02/09/2016  . Oxycodone  02/09/2016  . Penicillins  02/09/2016  . Tetracyclines & related  02/09/2016  . Tizanidine  02/09/2016    ROS:  General: Negative for anorexia, weight loss, fever, chills, fatigue, weakness. ENT: Negative for hoarseness, difficulty swallowing , nasal congestion. CV: Negative for chest pain, angina, palpitations, dyspnea on exertion, peripheral edema.  Respiratory: Negative for dyspnea  at rest, dyspnea on exertion, cough, sputum, wheezing.  GI: See history of present illness. GU:  Negative for dysuria, hematuria, urinary incontinence, urinary frequency, nocturnal urination.  Endo: Negative for unusual weight change.    Physical Examination:   BP 107/62 (BP Location: Left Arm, Patient Position: Sitting, Cuff Size: Normal)   Pulse 73   Temp 97.8 F (36.6 C) (Oral)   Ht 5\' 4"  (1.626 m)   Wt 160 lb 6.4 oz (72.8 kg)   BMI 27.53 kg/m   General: Well-nourished, well-developed in no acute distress.  Eyes: No icterus. Conjunctivae pink. Mouth: Oropharyngeal mucosa moist and pink , no lesions erythema or exudate. Lungs: Clear to  auscultation bilaterally. Non-labored. Heart: Regular rate and rhythm, no murmurs rubs or gallops.  Abdomen: Bowel sounds are normal, nontender, nondistended, no hepatosplenomegaly or masses, no abdominal bruits or hernia , no rebound or guarding.   Extremities: No lower extremity edema. No clubbing or deformities. Neuro: Alert and oriented x 3.  Grossly intact. Skin: Warm and dry, no jaundice.   Psych: Alert and cooperative, normal mood and affect.   Imaging Studies: No results found.  Assessment and Plan:   Isabel Hernandez is a 76 y.o. y/o female here to follow up for  for dysphagia likely secondary to GERD.Symptoms have resolved with use of PPI in the am and zantac at night . I will plan to d.c Zantac today, further reduce dose of PPI at next visit and eventually plan to have her back on only a small dose of zantac and off all PPI due to the long term side effect profile.   Counseled on life style changes, suggest to use PPI first thing in the morning on empty stomach and eat 30 minutes after. Advised on the use of a wedge pillow at night , avoid meals for 2 hours prior to bed time. Weight loss .  Dr Jonathon Bellows  MD Follow up in 3-4 months.

## 2017-01-11 ENCOUNTER — Other Ambulatory Visit: Payer: Medicare Other

## 2017-01-14 ENCOUNTER — Other Ambulatory Visit: Payer: Self-pay | Admitting: Internal Medicine

## 2017-01-20 ENCOUNTER — Other Ambulatory Visit (INDEPENDENT_AMBULATORY_CARE_PROVIDER_SITE_OTHER): Payer: Medicare Other

## 2017-01-20 DIAGNOSIS — R7989 Other specified abnormal findings of blood chemistry: Secondary | ICD-10-CM

## 2017-01-20 DIAGNOSIS — R945 Abnormal results of liver function studies: Principal | ICD-10-CM

## 2017-01-20 LAB — HEPATIC FUNCTION PANEL
ALK PHOS: 30 U/L — AB (ref 39–117)
ALT: 20 U/L (ref 0–35)
AST: 36 U/L (ref 0–37)
Albumin: 4.5 g/dL (ref 3.5–5.2)
BILIRUBIN DIRECT: 0.3 mg/dL (ref 0.0–0.3)
BILIRUBIN TOTAL: 1.2 mg/dL (ref 0.2–1.2)
Total Protein: 7.1 g/dL (ref 6.0–8.3)

## 2017-01-29 NOTE — Progress Notes (Signed)
[facility] [address] [place], [place] [address] Phone: [phone] Subjective:    I'm seeing this patient by the request  of:  Tommi Rumps, MD   CC: right hip pain f/u   CZY:[id number]  Isabel Hernandez is a 77 y.o. female coming in with complaint of right hip pain. Past medical history is significant for history of breast cancer, atrial fibrillation but is not on any blood thinners,as well as left-sided knee replacement. Patient was seen previously and there was a potential for a proximal femur stress fracture. Patient was put on once weekly vitamin D, icing regimen, and discuss compression. Patient states no improvement, if anything seems to be worsening, more back pain and nighttime pain as well.     patient did have previous imaging. Patient did have a right hip x-ray done 08/19/2016 that was independently visualized by me.seems to be unremarkable. 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  Past Medical History:  Diagnosis Date  . A-fib ([place])   . Arthritis   . Breast cancer ([place]) 2002   RT LUMPECTOMY  . Bronchitis   . Carotid artery narrowings   . 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 Carotid artery occlusion    RIGHT 50% BLOCKAGE  . Colon polyps   . Complication of anesthesia    shaking/ FOLLOWING LOCAL AT DENTIST , drop O2 sats TO 50% DURING COLONOSCOPY  . Coronary artery disease   . Diverticulosis   . Dysrhythmia   . Environmental allergies   . GERD (gastroesophageal reflux disease)   . History of hiatal hernia   . Hyperlipidemia   . Hypertension   . IBS (irritable bowel syndrome)   . Mitral valve disorder   . Shortness of breath dyspnea   . Syncope and collapse   . UTI (lower urinary tract infection)    Past Surgical History:  Procedure Laterality Date  . APPENDECTOMY    . BREAST BIOPSY Right 2002   POS  . BREAST CYST ASPIRATION Right   . BREAST EXCISIONAL BIOPSY Left 1997   NEG  . CATARACT  EXTRACTION W/PHACO Right 06/07/2016   Procedure: CATARACT EXTRACTION PHACO AND INTRAOCULAR LENS PLACEMENT (IOC);  Surgeon: Birder Robson, MD;  Location: [facility]S;  Service: Ophthalmology;  Laterality: Right;  [place] 00:51 AP% 19.6 CDE 10.09 Fluid pack lot # [id number]  . CATARACT EXTRACTION W/PHACO Left 07/05/2016   Procedure: CATARACT EXTRACTION PHACO AND INTRAOCULAR LENS PLACEMENT (IOC);  Surgeon: Birder Robson, MD;  Location: [facility];  Service: Ophthalmology;  Laterality: Left;  [place] 00:38 AP% 24.9 CDE 9.60 Fluid Pack lot # [id number]  . CESAREAN SECTION    . TONSILLECTOMY    . TOTAL KNEE ARTHROPLASTY Left    Social History   Social History  . Marital status: Married    Spouse name: N/A  . Number of children: N/A  . Years of education: N/A   Social History Main Topics  . Smoking status: Former Smoker    Years: 10.00    Types: Cigarettes  . Smokeless tobacco: Never Used  . Alcohol use 0.0 oz/week     Comment: couple drinks/week  . Drug use: No  . Sexual activity: Not Asked   Other Topics Concern  . None   Social History Narrative  . None   Allergies  Allergen Reactions  . Biaxin [Clarithromycin]     Rash   . Latex     Rash   . Lidocaine  Swelling    Eye swelling  . Nickel   . Nitroglycerin     Can only use the patch   . Oxycodone     Hives  . Penicillins     Hives Has patient had a PCN reaction causing immediate rash, facial/tongue/throat swelling, SOB or lightheadedness with hypotension: YES Has patient had a PCN reaction causing severe rash involving mucus membranes or skin necrosis: NO Has patient had a PCN reaction that required hospitalization NO Has patient had a PCN reaction occurring within the last 10 years: No If all of the above answers are "NO", then may proceed with Cephalosporin use.  . Tetracyclines & Related      Rash   . Tizanidine     Rash    Family History  Problem Relation Age of Onset  . Lung cancer Father   . Prostate cancer Father     . Hyperlipidemia Brother   . Heart disease Mother   . Lung cancer Sister   . Hypertension Brother   . Kidney disease      Maternal grandparent  . Diabetes Brother   . Breast cancer Neg Hx     Past medical history, social, surgical and family history all reviewed in electronic medical record.  No pertanent information unless stated regarding to the chief complaint.   Review of Systems: No headache, visual changes, nausea, vomiting, diarrhea, constipation, dizziness, abdominal pain, skin rash, fevers, chills, night sweats, weight loss, swollen lymph nodes, chest pain, shortness of breath, mood changes.  Positive muscle aches and joint pain positive nighttime pain with history of cancer  Objective  Blood pressure 116/64, pulse 62, resp. rate 16, weight 159 lb 4 oz (72.2 kg), SpO2 92 %.   Systems examined below as of 01/30/17 General: NAD A&O x3 mood, affect normal  HEENT: Pupils equal, extraocular movements intact no nystagmus Respiratory: not short of breath at rest or with speaking Cardiovascular: No lower extremity edema, non tender Skin: Warm dry intact with no signs of infection or rash on extremities or on axial skeleton. Abdomen: Soft nontender, no masses Neuro: Cranial nerves  intact, neurovascularly intact in all extremities with 2+ DTRs and 2+ pulses. Lymph: No lymphadenopathy appreciated today  Gait mild antalgic MSK: Non tender with full range of motion and good stability and symmetric strength and tone of shoulders, elbows, wrist and ankles bilaterally.  Left knee replacement LMB:[id number] ROM IR: 25 Deg, ER: 45 Deg, Flexion: 120 Deg, Extension: 100 Deg, Abduction: 45 Deg, Adduction: 45 Deg Strength Increasing weakness of 4 out of 5 strength in all planes which is new Pelvic alignment unremarkable to inspection and palpation. Standing hip rotation and gait without trendelenburg sign / unsteadiness. Greater trochanter without tenderness to palpation. No tenderness over  piriformis and greater trochanter. Positive straight leg test on the right side. This is different than previous exam Mild pain over the right sacroiliac joint Patient does have a positive fulcrum test of the right leg is worse than previous exam  Back Exam:  Inspection: Unremarkable  Motion: Flexion 25 deg, Extension 15 deg, Side Bending to 20 deg bilaterally,  Rotation to 20 deg bilaterally  SLR laying: Positive on the right side XSLR laying: Negative  Palpable tenderness: Tenderness of the sacroiliac joints bilaterally.Marland Kitchen FABER: negative. Sensory change: Gross sensation intact to all lumbar and sacral dermatomes.  Reflexes: 2+ at both patellar tendons, 2+ at achilles tendons, Babinski's downgoing.  Strength at foot  4 out of 5 strength on the right side  compared to the contralateral side Antalgic gait     Impression and Recommendations:     This case required medical decision making of moderate complexity.      Note: This dictation was prepared with Dragon dictation along with smaller phrase technology. Any transcriptional errors that result from this process are unintentional.

## 2017-01-30 ENCOUNTER — Encounter: Payer: Self-pay | Admitting: Family Medicine

## 2017-01-30 ENCOUNTER — Ambulatory Visit: Payer: Self-pay

## 2017-01-30 ENCOUNTER — Ambulatory Visit (INDEPENDENT_AMBULATORY_CARE_PROVIDER_SITE_OTHER): Payer: Medicare Other | Admitting: Family Medicine

## 2017-01-30 VITALS — BP 116/64 | HR 62 | Resp 16 | Wt 159.2 lb

## 2017-01-30 DIAGNOSIS — M25551 Pain in right hip: Secondary | ICD-10-CM

## 2017-01-30 DIAGNOSIS — M5416 Radiculopathy, lumbar region: Secondary | ICD-10-CM | POA: Diagnosis not present

## 2017-01-30 DIAGNOSIS — I6529 Occlusion and stenosis of unspecified carotid artery: Secondary | ICD-10-CM

## 2017-01-30 DIAGNOSIS — M4800 Spinal stenosis, site unspecified: Secondary | ICD-10-CM | POA: Diagnosis not present

## 2017-01-30 MED ORDER — DIAZEPAM 5 MG PO TABS: ORAL_TABLET | ORAL | 0 refills | Status: DC

## 2017-01-30 NOTE — Assessment & Plan Note (Signed)
Patient is having worsening right hip pain. Now seems to be also having back pain with a positive for radiculopathy. Discussed with patient at great length. Patient has a past medical history significant for breast cancer and is having more nighttime pain. I do feel at this point that MRI of the lumbar spine as well as the hip is necessary. Discussed with patient at great length. Continue all other therapies. Depending on imaging and we'll discuss further treatment options.

## 2017-01-30 NOTE — Patient Instructions (Addendum)
Good to see you  I am sorry not much better  We will try MRI of the back and the hip to make sure we are treating the right thing.  I gave you valium to help you relax during the MRI  I will write you and tell you the next step

## 2017-01-30 NOTE — Assessment & Plan Note (Signed)
Patient is having new radicular symptoms with weakness of the lower leg. Past medical history significant breast cancer. Advance imaging ordered today. Patient does have severe worsening symptoms to seek medical attention medially. Depending on imaging we'll see how patient does.

## 2017-01-31 DIAGNOSIS — J3089 Other allergic rhinitis: Secondary | ICD-10-CM | POA: Diagnosis not present

## 2017-01-31 DIAGNOSIS — J3081 Allergic rhinitis due to animal (cat) (dog) hair and dander: Secondary | ICD-10-CM | POA: Diagnosis not present

## 2017-01-31 DIAGNOSIS — J4520 Mild intermittent asthma, uncomplicated: Secondary | ICD-10-CM | POA: Diagnosis not present

## 2017-01-31 DIAGNOSIS — J301 Allergic rhinitis due to pollen: Secondary | ICD-10-CM | POA: Diagnosis not present

## 2017-02-06 NOTE — Progress Notes (Signed)
[facility] Consultation      Assessment and Plan:  The patient is a 76 year old female with chronic dysphagia and aspiration pneumonitis, and a lung nodule.  GERD with dysphagia and aspiration.  --Discussed strategies to reduce reflux including avoiding fatty meals, and not eating 4 hours before bedtime.  -The patient has a history of esophageal narrowing/stricture, for which she has undergone a esophageal dilatation in early 2017.  --She was referred to Dr. Allen Norris, Gastroenterology and prescribed protonix 40 mg bid, but did not take it due to fatigue. She prefers not to return to Dr. Allen Norris. Will refer to Dr. Vicente Males.  --Will try lansoprazole.  Lung nodule. -Patient has a family history of 2 first-degree relatives with lung cancer. Discussed repeat CT scan, she would like to be conservative and repeat the CT in a year from previous.  --Review of CT shows small,  low risk nodules present in the lungs --Repeat CT chest in August or later ( 1 year previous).   Asthma and/or aspiration pneumonitis.  -I suspect that her dyspnea/asthma is predominantly due to aspiration pneumonitis secondary to esophageal stasis and reflux. --did not respond to trial of anoro inhaler.  --I suspect that this is secondary to aspiration pneumonitis due to Esophageal narrowing, with reflux. --Review of CT chest shows no primary lung pathology to explain her dyspnea. Echo is normal. PFT is normal.      Date: 02/06/2017  MRN# [id number] Isabel Hernandez 19-Jun-1941   Isabel Hernandez is a 76 y.o. old female seen in consultation for chief complaint of:    Chief Complaint  Patient presents with  . Follow-up    pt c/o some sob. pt denies cough, reflux much better since seeing Dr. Vicente Males.    HPI:   The patient is a 76 year old female whopreviously smoked for 10 years and quit in 1971. Lung functions and PFT were within normal limits. It was suspected that she was having dyspnea due to chronic aspiration  pneumonitis with esophageal narrowing.  Since her last visit she has seen Dr. Vicente Males, she is being maintained on PPI and Zantac, these were being weaned down as tolerated.  She is now off of zantac, she is sleeping with head elevation. She notes that this is helping with the GERD. She notes that she still has some dyspnea, but this is better. She still gets dyspnea on exertion. She had pneumonia in January of this year, and is now feeling that she is getting over it.   CT chest 05/03/16 personally reviewed by me: 1-49mm nodule noted and 51mm nodules noted in right lung. There is some scattered infiltrates at the bases bilaterally. These are very subtle. No other abnormalities noted.  Review of outside chest x-ray report 12/01/15 left base granuloma seen, new since previous study on 07/17/14. No current films are available for review.   Echocardiogram in 2015 showed normal LV systolic function with an ejection fraction of 65%, mild mitral regurgitation, mild tricuspid regurgitation with mild pulmonary hypertension.   PFT 05/02/16: Spirometry FVC was 99% of predicted, FEV1 was 97% predicted, there is no significant improvement with bronchodilator. FEV to FVC ratio was 73%. Lung volumes. Residual volume was 83% of predicted, RV/TLC ratio is 112% of predicted, total lung capacity was 74% of predicted. Diffusion: Diffusion capacity was 95% of predicted. Flow volume loop was normal. Interpretation: Overall this test shows normal pulmonary functions without evidence of obstructive lung disease.  PMHX:   Past Medical History:  Diagnosis Date  . A-fib (  [place])   . Arthritis   . Breast cancer ([place]) 2002   RT LUMPECTOMY  . Bronchitis   . Carotid artery narrowings   . 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 Carotid artery occlusion    RIGHT 50% BLOCKAGE  . Colon polyps   . Complication of anesthesia    shaking/ FOLLOWING LOCAL AT DENTIST , drop O2 sats TO 50% DURING COLONOSCOPY  . Coronary artery  disease   . Diverticulosis   . Dysrhythmia   . Environmental allergies   . GERD (gastroesophageal reflux disease)   . History of hiatal hernia   . Hyperlipidemia   . Hypertension   . IBS (irritable bowel syndrome)   . Mitral valve disorder   . Shortness of breath dyspnea   . Syncope and collapse   . UTI (lower urinary tract infection)    Surgical Hx:  Past Surgical History:  Procedure Laterality Date  . APPENDECTOMY    . BREAST BIOPSY Right 2002   POS  . BREAST CYST ASPIRATION Right   . BREAST EXCISIONAL BIOPSY Left 1997   NEG  . CATARACT EXTRACTION W/PHACO Right 06/07/2016   Procedure: CATARACT EXTRACTION PHACO AND INTRAOCULAR LENS PLACEMENT (IOC);  Surgeon: Birder Robson, MD;  Location: [facility] ORS;  Service: Ophthalmology;  Laterality: Right;  [place] 00:51 AP% 19.6 CDE 10.09 Fluid pack lot # [id number]  . CATARACT EXTRACTION W/PHACO Left 07/05/2016   Procedure: CATARACT EXTRACTION PHACO AND INTRAOCULAR LENS PLACEMENT (IOC);  Surgeon: Birder Robson, MD;  Location: [facility] ORS;  Service: Ophthalmology;  Laterality: Left;  [place] 00:38 AP% 24.9 CDE 9.60 Fluid Pack lot # [id number]  . CESAREAN SECTION    . TONSILLECTOMY    . TOTAL KNEE ARTHROPLASTY Left    Family Hx:  Family History  Problem Relation Age of Onset  . Lung cancer Father   . Prostate cancer Father   . Hyperlipidemia Brother   . Heart disease Mother   . Lung cancer Sister   . Hypertension Brother   . Kidney disease      Maternal grandparent  . Diabetes Brother   . Breast cancer Neg Hx    Social Hx:   Social History  Substance Use Topics  . Smoking status: Former Smoker    Years: 10.00    Types: Cigarettes  . Smokeless tobacco: Never Used  . Alcohol use 0.0 oz/week     Comment: couple drinks/week   Medication:    Reviewed   Allergies:  Biaxin [clarithromycin]; Latex; Lidocaine; Nickel; Nitroglycerin; Oxycodone; Penicillins; Tetracyclines & related; and Tizanidine  Review of Systems: Gen:  Denies   fever, sweats, chills HEENT: Denies blurred vision, double vision. bleeds, Cvc:  No dizziness, chest pain. Resp:   Denies cough or sputum production, shortness of breath Gi: Denies swallowing difficulty, stomach pain. Gu:  Denies bladder incontinence, burning urine Ext:   No Joint pain, stiffness. Skin: No skin rash,  hives  Endoc:  No polyuria, polydipsia. Psych: No depression, insomnia. Other:  All other systems were reviewed with the patient and were negative other that what is mentioned in the HPI.   Physical Examination:   VS: BP (!) 90/56 (BP Location: Right Arm, Patient Position: Sitting, Cuff Size: Normal)   Pulse 69   Resp 16   Ht 5\' 4"  (1.626 m)   Wt 159 lb (72.1 kg)   SpO2 95%   BMI 27.29 kg/m   General Appearance: No distress  Neuro:without focal findings,  speech normal,  HEENT:  PERRLA, EOM intact.   Pulmonary: Scattered bibasilar crackles heard posteriorly. CardiovascularNormal S1,S2.  No m/r/g.   Abdomen: Benign, Soft, non-tender. Renal:  No costovertebral tenderness  GU:  No performed at this time. Endoc: No evident thyromegaly, no signs of acromegaly. Skin:   warm, no rashes, no ecchymosis  Extremities: normal, no cyanosis, clubbing.  Other findings:    LABORATORY PANEL:   CBC No results for input(s): WBC, HGB, HCT, PLT in the last 168 hours. ------------------------------------------------------------------------------------------------------------------  Chemistries  No results for input(s): NA, K, CL, CO2, GLUCOSE, BUN, CREATININE, CALCIUM, MG, AST, ALT, ALKPHOS, BILITOT in the last 168 hours.  Invalid input(s): GFRCGP ------------------------------------------------------------------------------------------------------------------  Cardiac Enzymes No results for input(s): TROPONINI in the last 168 hours. ------------------------------------------------------------  RADIOLOGY:  No results found.     Thank  you for the consultation and for  allowing [facility] to assist in the care of your patient. Our recommendations are noted above.  Please contact us if we can be of further service.   Marda Stalker, MD.  Board Certified in Internal Medicine, Pulmonary Medicine, [place], and Sleep Medicine.  [facility] Office Number: [phone]  Patricia Pesa, M.D.  Vilinda Boehringer, M.D.  Merton Border, M.D  02/06/2017

## 2017-02-07 ENCOUNTER — Ambulatory Visit (INDEPENDENT_AMBULATORY_CARE_PROVIDER_SITE_OTHER): Payer: Medicare Other | Admitting: Internal Medicine

## 2017-02-07 ENCOUNTER — Encounter: Payer: Self-pay | Admitting: Internal Medicine

## 2017-02-07 ENCOUNTER — Other Ambulatory Visit: Payer: Self-pay | Admitting: *Deleted

## 2017-02-07 VITALS — BP 90/56 | HR 69 | Resp 16 | Ht 64.0 in | Wt 159.0 lb

## 2017-02-07 DIAGNOSIS — R911 Solitary pulmonary nodule: Secondary | ICD-10-CM | POA: Diagnosis not present

## 2017-02-07 DIAGNOSIS — J42 Unspecified chronic bronchitis: Secondary | ICD-10-CM

## 2017-02-07 DIAGNOSIS — I6529 Occlusion and stenosis of unspecified carotid artery: Secondary | ICD-10-CM

## 2017-02-07 NOTE — Addendum Note (Signed)
Addended by: Devona Konig on: 02/07/2017 02:25 PM   Modules accepted: Orders

## 2017-02-07 NOTE — Patient Instructions (Addendum)
--  Repeat Ct chest without contrast in 3-4 months, follow up after.   -Continue acid control regimen as per the gastroenterologist recommendations.

## 2017-02-17 ENCOUNTER — Ambulatory Visit
Admission: RE | Admit: 2017-02-17 | Discharge: 2017-02-17 | Disposition: A | Discharge status: Home / Self Care | Payer: Medicare Other | Source: Ambulatory Visit | Attending: Family Medicine | Admitting: Family Medicine

## 2017-02-17 DIAGNOSIS — M4800 Spinal stenosis, site unspecified: Secondary | ICD-10-CM

## 2017-02-17 DIAGNOSIS — M48061 Spinal stenosis, lumbar region without neurogenic claudication: Secondary | ICD-10-CM | POA: Diagnosis not present

## 2017-02-17 DIAGNOSIS — S76312A Strain of muscle, fascia and tendon of the posterior muscle group at thigh level, left thigh, initial encounter: Secondary | ICD-10-CM | POA: Diagnosis not present

## 2017-02-17 MED ORDER — GADOBENATE DIMEGLUMINE 529 MG/ML IV SOLN
8.0000 mL | Freq: Once | INTRAVENOUS | Status: AC | PRN
  Administered 2017-02-17: 8 mL via INTRAVENOUS

## 2017-02-22 ENCOUNTER — Ambulatory Visit: Payer: Medicare Other | Admitting: Family Medicine

## 2017-02-28 NOTE — Progress Notes (Signed)
[facility] [place], [place] [address] Phone: [phone] Subjective:    I'm seeing this patient by the request  of:  Leone Haven, MD   CC: right hip pain f/u   AJG:[id number]  Isabel Hernandez is a 76 y.o. female coming in with complaint of right hip pain. Past medical history is significant for history of breast cancer. Patient was having worsening symptoms and did have a lumbar MRI done. This is independent living visualized by me. Patient was found to have moderate to severe facet degenerative changes at L4-L5 with moderate spinal stenosis. Patient was to potentially consider the possibility of an epidural. Patient states Continuing have pain with radicular symptoms going down both legs. Seems to be worsening now going to walk 150 feet without it starting to give her difficulty.     Past Medical History:  Diagnosis Date  . A-fib ([place])   . Arthritis   . Breast cancer ([place]) 2002   RT LUMPECTOMY  . Bronchitis   . Carotid artery narrowings   . 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 Carotid artery occlusion    RIGHT 50% BLOCKAGE  . Colon polyps   . Complication of anesthesia    shaking/ FOLLOWING LOCAL AT DENTIST , drop O2 sats TO 50% DURING COLONOSCOPY  . Coronary artery disease   . Diverticulosis   . Dysrhythmia   . Environmental allergies   . GERD (gastroesophageal reflux disease)   . History of hiatal hernia   . Hyperlipidemia   . Hypertension   . IBS (irritable bowel syndrome)   . Mitral valve disorder   . Shortness of breath dyspnea   . Syncope and collapse   . UTI (lower urinary tract infection)    Past Surgical History:  Procedure Laterality Date  . APPENDECTOMY    . BREAST BIOPSY Right 2002   POS  . BREAST CYST ASPIRATION Right   . BREAST EXCISIONAL BIOPSY Left 1997   NEG  . CATARACT EXTRACTION W/PHACO Right 06/07/2016   Procedure: CATARACT EXTRACTION PHACO AND INTRAOCULAR LENS PLACEMENT (IOC);  Surgeon:  Birder Robson, MD;  Location: [facility] ORS;  Service: Ophthalmology;  Laterality: Right;  [place] 00:51 AP% 19.6 CDE 10.09 Fluid pack lot # [id number]  . CATARACT EXTRACTION W/PHACO Left 07/05/2016   Procedure: CATARACT EXTRACTION PHACO AND INTRAOCULAR LENS PLACEMENT (IOC);  Surgeon: Birder Robson, MD;  Location: [facility];  Service: Ophthalmology;  Laterality: Left;  [place] 00:38 AP% 24.9 CDE 9.60 Fluid Pack lot # [id number]  . CESAREAN SECTION    . TONSILLECTOMY    . TOTAL KNEE ARTHROPLASTY Left    Social History   Social History  . Marital status: Married    Spouse name: N/A  . Number of children: N/A  . Years of education: N/A   Social History Main Topics  . Smoking status: Former Smoker    Years: 10.00    Types: Cigarettes  . Smokeless tobacco: Never Used  . Alcohol use 0.0 oz/week     Comment: couple drinks/week  . Drug use: No  . Sexual activity: Not Asked   Other Topics Concern  . None   Social History Narrative  . None   Allergies  Allergen Reactions  . Biaxin [Clarithromycin]     Rash   . Latex     Rash   . Lidocaine Swelling    Eye swelling  . Nickel   . Nitroglycerin     Can  only use the patch   . Oxycodone     Hives  . Penicillins     Hives Has patient had a PCN reaction causing immediate rash, facial/tongue/throat swelling, SOB or lightheadedness with hypotension: YES Has patient had a PCN reaction causing severe rash involving mucus membranes or skin necrosis: NO Has patient had a PCN reaction that required hospitalization NO Has patient had a PCN reaction occurring within the last 10 years: No If all of the above answers are "NO", then may proceed with Cephalosporin use.  . Tetracyclines & Related      Rash   . Tizanidine     Rash    Family History  Problem Relation Age of Onset  . Lung cancer Father   . Prostate cancer Father   . Hyperlipidemia Brother   . Heart disease Mother   . Lung cancer Sister   . Hypertension Brother   . Kidney disease  Unknown        Maternal grandparent  . Diabetes Brother   . Breast cancer Neg Hx     Past medical history, social, surgical and family history all reviewed in electronic medical record.  No pertanent information unless stated regarding to the chief complaint.   Review of Systems: No headache, visual changes, nausea, vomiting, diarrhea, constipation, dizziness, abdominal pain, skin rash, fevers, chills, night sweats, weight loss, swollen lymph nodes, body aches, joint swelling, chest pain, shortness of breath, mood changes.  Positive joint pain  Objective  Blood pressure 122/80, pulse (!) 59, height 5\' 4"  (1.626 m), SpO2 93 %.   Systems examined below as of 03/01/17 General: NAD A&O x3 mood, affect normal  HEENT: Pupils equal, extraocular movements intact no nystagmus Respiratory: not short of breath at rest or with speaking Cardiovascular: No lower extremity edema, non tender Skin: Warm dry intact with no signs of infection or rash on extremities or on axial skeleton. Abdomen: Soft nontender, no masses Neuro: Cranial nerves  intact, neurovascularly intact in all extremities with 2+ DTRs and 2+ pulses. Lymph: No lymphadenopathy appreciated today  Gait normal with good balance and coordination.  MSK: Non tender with full range of motion and good stability and symmetric strength and tone of shoulders, elbows, wrist,  knee and ankles bilaterally.  Arthritic changes of multiple joints   Back Exam:  Inspection: Loss of lordosis Motion: Continued limited range of motion in all planes Rotation to 25 deg bilaterally  SLR laying: Severe tightness of the hamstrings bilaterally XSLR laying: Negative  Palpable tenderness: Increasing tenderness to palpation of the paraspinal musculature of the lumbar spine.Marland Kitchen FABER: negative. Sensory change: Gross sensation intact to all lumbar and sacral dermatomes.  Reflexes: 2+ at both patellar tendons, 2+ at achilles tendons, Babinski's downgoing.  Strength  at foot  4 out of 5 strength for seems to be symmetric    Impression and Recommendations:     This case required medical decision making of moderate complexity.      Note: This dictation was prepared with Dragon dictation along with smaller phrase technology. Any transcriptional errors that result from this process are unintentional.

## 2017-03-01 ENCOUNTER — Ambulatory Visit (INDEPENDENT_AMBULATORY_CARE_PROVIDER_SITE_OTHER): Payer: Medicare Other | Admitting: Family Medicine

## 2017-03-01 ENCOUNTER — Encounter: Payer: Self-pay | Admitting: Family Medicine

## 2017-03-01 VITALS — BP 122/80 | HR 59 | Ht 64.0 in

## 2017-03-01 DIAGNOSIS — I6529 Occlusion and stenosis of unspecified carotid artery: Secondary | ICD-10-CM

## 2017-03-01 DIAGNOSIS — M5416 Radiculopathy, lumbar region: Secondary | ICD-10-CM | POA: Diagnosis not present

## 2017-03-01 DIAGNOSIS — M48062 Spinal stenosis, lumbar region with neurogenic claudication: Secondary | ICD-10-CM | POA: Diagnosis not present

## 2017-03-01 DIAGNOSIS — M48061 Spinal stenosis, lumbar region without neurogenic claudication: Secondary | ICD-10-CM | POA: Insufficient documentation

## 2017-03-01 NOTE — Patient Instructions (Addendum)
Good to see you  Thank you for coming in Lets get the epidural and see how it goes. I am very optimistic.  See me again 2 weeks AFTER the epidural and we will see how you are doing.

## 2017-03-01 NOTE — Assessment & Plan Note (Signed)
Patient is having signs and symptoms with spinal stenosis. MRI does confirm presence. At this point I do think that she would be a candidate for epidural steroid injections and after discussing multiple different treatment options patient has elected this. Patient is going to continue with the other medications. Follow-up 2 weeks after the epidural. Continue home exercises and given a return to exercise protocol. Patient is in agreement. All questions were answered.  Spent  25 minutes with patient face-to-face and had greater than 50% of counseling including as described above in assessment and plan including different treatment options for the diagnosis that we discussed in great detail, we went over different pictures including a MRI as well as a poster of the involved area.Marland Kitchen

## 2017-03-03 ENCOUNTER — Ambulatory Visit (INDEPENDENT_AMBULATORY_CARE_PROVIDER_SITE_OTHER): Payer: Medicare Other | Admitting: Family Medicine

## 2017-03-03 ENCOUNTER — Encounter: Payer: Self-pay | Admitting: Family Medicine

## 2017-03-03 DIAGNOSIS — K219 Gastro-esophageal reflux disease without esophagitis: Secondary | ICD-10-CM | POA: Diagnosis not present

## 2017-03-03 DIAGNOSIS — J189 Pneumonia, unspecified organism: Secondary | ICD-10-CM

## 2017-03-03 DIAGNOSIS — Z1231 Encounter for screening mammogram for malignant neoplasm of breast: Secondary | ICD-10-CM | POA: Diagnosis not present

## 2017-03-03 DIAGNOSIS — M5416 Radiculopathy, lumbar region: Secondary | ICD-10-CM | POA: Diagnosis not present

## 2017-03-03 DIAGNOSIS — Z1211 Encounter for screening for malignant neoplasm of colon: Secondary | ICD-10-CM | POA: Diagnosis not present

## 2017-03-03 DIAGNOSIS — I6529 Occlusion and stenosis of unspecified carotid artery: Secondary | ICD-10-CM

## 2017-03-03 DIAGNOSIS — R87615 Unsatisfactory cytologic smear of cervix: Secondary | ICD-10-CM | POA: Diagnosis not present

## 2017-03-03 DIAGNOSIS — Z01419 Encounter for gynecological examination (general) (routine) without abnormal findings: Secondary | ICD-10-CM | POA: Diagnosis not present

## 2017-03-03 DIAGNOSIS — I10 Essential (primary) hypertension: Secondary | ICD-10-CM | POA: Diagnosis not present

## 2017-03-03 NOTE — Assessment & Plan Note (Signed)
Chronic issue. Stable or slightly improved. She'll continue to follow with pulmonology and monitor for worsening symptoms.

## 2017-03-03 NOTE — Assessment & Plan Note (Signed)
At goal. Continue current medications. 

## 2017-03-03 NOTE — Progress Notes (Signed)
  Tommi Rumps, MD Phone: [phone]  Isabel Hernandez is a 76 y.o. female who presents today for f/u.  HYPERTENSION  Disease Monitoring  Home BP Monitoring not checking Chest pain- no    Dyspnea- yes, see below Medications  Compliance-  Taking amlodipine, HCTZ, losartan, metoprolol. Lightheadedness-  no  Edema- no  Patient has been following with pulmonology and GI for chronic reflux and aspiration pneumonitis. She was trialed on an inhaler though had no benefit with this. Still has some shortness of breath that is slightly improved. No cough. No chest pain. Notes still has reflux that is somewhat better. She's taking Prevacid in the morning and Zantac at night. Notes symptoms come and go. No blood in her stool.  Low-back pain: She has seen sports medicine for this. They found that she had a pinched nerve at L4-5. Initially thought it was a compression fracture and started her on vitamin D though she did not improve. She's been taking Aleve with no benefit. Notes her back hurts if she has been standing for any period of time greater than 10 minutes. No numbness or weakness. No loss of bowel or bladder function. No saddle anesthesia.  PMH: former    ROS see history of present illness  Objective  Physical Exam Vitals:   03/03/17 0915  BP: 104/66  Pulse: 65  Temp: 98.4 F (36.9 C)    BP Readings from Last 3 Encounters:  03/03/17 104/66  03/01/17 122/80  02/07/17 (!) 90/56   Wt Readings from Last 3 Encounters:  03/03/17 160 lb (72.6 kg)  02/07/17 159 lb (72.1 kg)  01/30/17 159 lb 4 oz (72.2 kg)    Physical Exam  Constitutional: No distress.  Cardiovascular: Normal rate, regular rhythm and normal heart sounds.   Pulmonary/Chest: Effort normal and breath sounds normal.  Abdominal: Soft. Bowel sounds are normal. She exhibits no distension. There is no tenderness.  Musculoskeletal: She exhibits no edema.  No midline spine tenderness, no midline spine step-off, no muscular  back tenderness  Neurological: She is alert. Gait normal.  Skin: Skin is warm and dry. She is not diaphoretic.     Assessment/Plan: Please see individual problem list.  Essential hypertension At goal. Continue current medications.  Pneumonitis Chronic issue. Stable or slightly improved. She'll continue to follow with pulmonology and monitor for worsening symptoms.  GERD (gastroesophageal reflux disease) Continues to have intermittent issues with reflux. Taking Prevacid and Zantac. She'll continue this and continue to follow with GI.  Lumbar radiculopathy, right Due to have an epidural injection next week. Encouraged her to keep this appointment and continue to follow with Dr. Tamala Julian.   Tommi Rumps, MD [place]

## 2017-03-03 NOTE — Patient Instructions (Signed)
Nice to see you. Please continue your blood pressure medications. Please continue your reflux medicines. If your reflux worsens or your breathing worsens please let us know.

## 2017-03-03 NOTE — Assessment & Plan Note (Signed)
Continues to have intermittent issues with reflux. Taking Prevacid and Zantac. She'll continue this and continue to follow with GI.

## 2017-03-03 NOTE — Assessment & Plan Note (Signed)
Due to have an epidural injection next week. Encouraged her to keep this appointment and continue to follow with Dr. Tamala Julian.

## 2017-03-06 ENCOUNTER — Other Ambulatory Visit: Payer: Self-pay | Admitting: Obstetrics & Gynecology

## 2017-03-06 DIAGNOSIS — Z1231 Encounter for screening mammogram for malignant neoplasm of breast: Secondary | ICD-10-CM

## 2017-03-07 ENCOUNTER — Ambulatory Visit
Admission: RE | Admit: 2017-03-07 | Discharge: 2017-03-07 | Disposition: A | Discharge status: Home / Self Care | Payer: Medicare Other | Source: Ambulatory Visit | Attending: Family Medicine | Admitting: Family Medicine

## 2017-03-07 ENCOUNTER — Ambulatory Visit: Payer: Medicare Other | Admitting: Cardiovascular Disease

## 2017-03-07 DIAGNOSIS — M5116 Intervertebral disc disorders with radiculopathy, lumbar region: Secondary | ICD-10-CM | POA: Diagnosis not present

## 2017-03-07 DIAGNOSIS — M5416 Radiculopathy, lumbar region: Secondary | ICD-10-CM

## 2017-03-07 MED ORDER — IOPAMIDOL (ISOVUE-M 200) INJECTION 41%
1.0000 mL | Freq: Once | INTRAMUSCULAR | Status: AC
  Administered 2017-03-07: 1 mL via EPIDURAL

## 2017-03-07 MED ORDER — METHYLPREDNISOLONE ACETATE 40 MG/ML INJ SUSP (RADIOLOG
120.0000 mg | Freq: Once | INTRAMUSCULAR | Status: AC
  Administered 2017-03-07: 120 mg via EPIDURAL

## 2017-03-07 NOTE — Discharge Instructions (Signed)

## 2017-03-08 ENCOUNTER — Other Ambulatory Visit: Payer: Self-pay | Admitting: Cardiovascular Disease

## 2017-03-13 ENCOUNTER — Encounter: Payer: Self-pay | Admitting: Cardiovascular Disease

## 2017-03-13 ENCOUNTER — Ambulatory Visit (INDEPENDENT_AMBULATORY_CARE_PROVIDER_SITE_OTHER): Payer: Medicare Other | Admitting: Cardiovascular Disease

## 2017-03-13 VITALS — BP 110/60 | Ht 64.0 in | Wt 159.8 lb

## 2017-03-13 DIAGNOSIS — I779 Disorder of arteries and arterioles, unspecified: Secondary | ICD-10-CM | POA: Diagnosis not present

## 2017-03-13 DIAGNOSIS — I6529 Occlusion and stenosis of unspecified carotid artery: Secondary | ICD-10-CM | POA: Diagnosis not present

## 2017-03-13 DIAGNOSIS — I739 Peripheral vascular disease, unspecified: Principal | ICD-10-CM

## 2017-03-13 DIAGNOSIS — I10 Essential (primary) hypertension: Secondary | ICD-10-CM | POA: Diagnosis not present

## 2017-03-13 DIAGNOSIS — E785 Hyperlipidemia, unspecified: Secondary | ICD-10-CM | POA: Diagnosis not present

## 2017-03-13 MED ORDER — METOPROLOL TARTRATE 25 MG PO TABS: 25.0000 mg | ORAL_TABLET | Freq: Two times a day (BID) | ORAL | 3 refills | Status: DC

## 2017-03-13 NOTE — Patient Instructions (Signed)
Medication Instructions:  Your physician has recommended you make the following change in your medication:  STOP taking hydrocholorothiazide   Labwork: none  Testing/Procedures: none  Follow-Up: Your physician wants you to follow-up in: one year with Dr. Fletcher Anon.  You will receive a reminder letter in the mail two months in advance. If you don't receive a letter, please call our office to schedule the follow-up appointment.   Any Other Special Instructions Will Be Listed Below (If Applicable).     If you need a refill on your cardiac medications before your next appointment, please call your pharmacy.

## 2017-03-13 NOTE — Progress Notes (Signed)
Cardiology Office Note   Date:  03/16/2017   ID:  Isabel, Hernandez 08/23/1941, MRN [id number]  PCP:  Isabel Haven, MD  Cardiologist:   Kathlyn Sacramento, MD   Chief Complaint  Patient presents with  . other    6 month follow up. Meds reviewed by the pt. verbally. Pt. c/o dizziness when getting up too quickly or bending over.       History of Present Illness: Isabel Hernandez is a 76 y.o. female who is here today for a follow up visit regarding nonobstructive carotid stenosis, hypertension and hyperlipidemia. No previous cardiac history. No prior history of stroke. Most recent carotid Doppler was done in January 2017 which showed mild bilateral atherosclerosis . She had a nuclear stress test in February 2017 which was normal overall. She is also known to have aortic atherosclerosis.  Most recent echocardiogram in January of this year showed normal LV systolic function, grade 1 diastolic dysfunction, mild mitral regurgitation and no evidence of pulmonary hypertension. She has been doing well and denies any chest pain or shortness of breath. She complains of orthostatic dizziness without syncope.  Past Medical History:  Diagnosis Date  . A-fib ([place])   . Arthritis   . Breast cancer ([place]) 2002   RT LUMPECTOMY  . Bronchitis   . Carotid artery narrowings   . 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 Carotid artery occlusion    RIGHT 50% BLOCKAGE  . Colon polyps   . Complication of anesthesia    shaking/ FOLLOWING LOCAL AT DENTIST , drop O2 sats TO 50% DURING COLONOSCOPY  . Coronary artery disease   . Diverticulosis   . Dysrhythmia   . Environmental allergies   . GERD (gastroesophageal reflux disease)   . History of hiatal hernia   . Hyperlipidemia   . Hypertension   . IBS (irritable bowel syndrome)   . Mitral valve disorder   . Shortness of breath dyspnea   . Syncope and collapse   . UTI (lower urinary tract infection)     Past Surgical History:  Procedure Laterality  Date  . APPENDECTOMY    . BREAST BIOPSY Right 2002   POS  . BREAST CYST ASPIRATION Right   . BREAST EXCISIONAL BIOPSY Left 1997   NEG  . CATARACT EXTRACTION W/PHACO Right 06/07/2016   Procedure: CATARACT EXTRACTION PHACO AND INTRAOCULAR LENS PLACEMENT (IOC);  Surgeon: Birder Robson, MD;  Location: [facility] ORS;  Service: Ophthalmology;  Laterality: Right;  [place] 00:51 AP% 19.6 CDE 10.09 Fluid pack lot # [id number]  . CATARACT EXTRACTION W/PHACO Left 07/05/2016   Procedure: CATARACT EXTRACTION PHACO AND INTRAOCULAR LENS PLACEMENT (IOC);  Surgeon: Birder Robson, MD;  Location: [facility] ORS;  Service: Ophthalmology;  Laterality: Left;  [place] 00:38 AP% 24.9 CDE 9.60 Fluid Pack lot # [id number]  . CESAREAN SECTION    . TONSILLECTOMY    . TOTAL KNEE ARTHROPLASTY Left      Current Outpatient Prescriptions  Medication Sig Dispense Refill  . amLODipine (NORVASC) 5 MG tablet TAKE 1 TABLET BY MOUTH ONCE DAILY 90 tablet 0  . aspirin 81 MG tablet Take 81 mg by mouth daily.    . Aspirin-Calcium Carbonate 81-777 MG TABS Take by mouth.    Marland Kitchen atorvastatin (LIPITOR) 20 MG tablet Take 1 tablet (20 mg total) by mouth daily. 90 tablet 3  . azelastine (OPTIVAR) 0.05 % ophthalmic solution Place 1 drop into both eyes as needed.    . Calcium Carbonate (CALTRATE  600 PO) Take 1 tablet by mouth 2 (two) times daily.    . diazepam (VALIUM) 5 MG tablet One tab by mouth, 2 hours before procedure. 2 tablet 0  . fenofibrate (TRICOR) 145 MG tablet Take 1 tablet (145 mg total) by mouth daily. 90 tablet 3  . fluticasone (FLONASE) 50 MCG/ACT nasal spray Place 1 spray into both nostrils daily.    . lansoprazole (PREVACID) 15 MG capsule TAKE 1 CAPSULE EVERY DAY 30 capsule 5  . levocetirizine (XYZAL) 5 MG tablet Take 5 mg by mouth daily.    . metoprolol tartrate (LOPRESSOR) 25 MG tablet Take 1 tablet (25 mg total) by mouth 2 (two) times daily. 180 tablet 3  . Multiple Vitamin (MULTIVITAMIN WITH MINERALS) TABS tablet Take 1 tablet by  mouth daily.    . Omega-3 Fatty Acids (FISH OIL) 1000 MG CAPS Take by mouth.    . Vitamin D, Ergocalciferol, (DRISDOL) 50000 units CAPS capsule Take 1 capsule (50,000 Units total) by mouth every 7 (seven) days. 12 capsule 0  . losartan (COZAAR) 50 MG tablet TAKE ONE TABLET BY MOUTH EVERY DAY 90 tablet 3   No current facility-administered medications for this visit.     Allergies:   Biaxin [clarithromycin]; Latex; Lidocaine; Nickel; Nitroglycerin; Oxycodone; Penicillins; Tetracyclines & related; and Tizanidine    Social History:  The patient  reports that she has quit smoking. Her smoking use included Cigarettes. She quit after 10.00 years of use. She has never used smokeless tobacco. She reports that she drinks alcohol. She reports that she does not use drugs.   Family History:  The patient's family history includes Diabetes in her brother; Heart disease in her mother; Hyperlipidemia in her brother; Hypertension in her brother; Lung cancer in her father and sister; Prostate cancer in her father.    ROS:  Please see the history of present illness.   Otherwise, review of systems are positive for none.   All other systems are reviewed and negative.    PHYSICAL EXAM: VS:  BP 110/60 (BP Location: Left Arm, Patient Position: Sitting, Cuff Size: Normal)   Ht 5\' 4"  (1.626 m)   Wt 159 lb 12 oz (72.5 kg)   BMI 27.42 kg/m  , BMI Body mass index is 27.42 kg/m. GEN: Well nourished, well developed, in no acute distress  HEENT: normal  Neck: no JVD, carotid bruits, or masses Cardiac: RRR; no murmurs, rubs, or gallops,no edema  Respiratory:  clear to auscultation bilaterally, normal work of breathing GI: soft, nontender, nondistended, + BS MS: no deformity or atrophy  Skin: warm and dry, no rash Neuro:  Strength and sensation are intact Psych: euthymic mood, full affect   EKG:  EKG is ordered today. The ekg ordered today demonstrates normal sinus rhythm with  PVCs and possible left atrial  enlargement.  Recent Labs: 11/25/2016: BUN 16; Creatinine, Ser 1.05; Hemoglobin 13.2; Platelets 150.0; Potassium 3.7; Sodium 138 01/20/2017: ALT 20    Lipid Panel    Component Value Date/Time   CHOL 133 09/05/2016 1410   TRIG 165 (H) 09/05/2016 1410   HDL 19 (L) 09/05/2016 1410   CHOLHDL 7.0 (H) 09/05/2016 1410   LDLCALC 81 09/05/2016 1410      Wt Readings from Last 3 Encounters:  03/13/17 159 lb 12 oz (72.5 kg)  03/03/17 160 lb (72.6 kg)  02/07/17 159 lb (72.1 kg)        ASSESSMENT AND PLAN:  1. Mild bilateral carotid stenosis: Most recent carotid Doppler in  January showed less than 40% stenosis bilaterally. Thus, no need to repeat testing. Continue low-dose aspirin and treatment of risk factors.   2. Essential hypertension: Blood pressure is well controlled on current medications. However, she is having orthostatic dizziness. I discontinued hydrochlorothiazide.  3. Hyperlipidemia: She is currently on atorvastatin and fenofibrate. Most recent lipid profile in December of last year showed an LDL of 81 and triglyceride of 165.    Disposition:   FU with me in 12 months  Signed,  Kathlyn Sacramento, MD  03/16/2017 1:15 PM    [facility]

## 2017-03-16 ENCOUNTER — Other Ambulatory Visit: Payer: Self-pay | Admitting: Cardiovascular Disease

## 2017-03-20 ENCOUNTER — Telehealth: Payer: Self-pay | Admitting: Family Medicine

## 2017-03-20 DIAGNOSIS — M5416 Radiculopathy, lumbar region: Secondary | ICD-10-CM

## 2017-03-20 NOTE — Telephone Encounter (Signed)
Pt called stating that on June 5th she had an epidural. It did help some but not very much. She is leaving to go on vacation on July 15th and was wanting to know if she could have another one before she leaves.

## 2017-03-20 NOTE — Telephone Encounter (Signed)
Sure  We can try one more at L4/5

## 2017-03-20 NOTE — Telephone Encounter (Signed)
Epidural ordered & sent to [facility]. Pt made aware.  

## 2017-03-28 ENCOUNTER — Ambulatory Visit: Payer: Medicare Other | Admitting: Family Medicine

## 2017-04-06 ENCOUNTER — Telehealth: Payer: Self-pay | Admitting: Family Medicine

## 2017-04-06 NOTE — Telephone Encounter (Signed)
Given last by Hulan Saas for surgery #2, please advise

## 2017-04-06 NOTE — Telephone Encounter (Signed)
Pt called requesting a refill on her diazepam (VALIUM) 5 MG tablet. Pt is going on a trip and needs it for the plane ride. Please advise, thank you!  [place], [place]  Call pt @ [phone]

## 2017-04-06 NOTE — Telephone Encounter (Signed)
Please get more details as to why she needs this medication to travel. Thanks.

## 2017-04-07 NOTE — Telephone Encounter (Signed)
Please call patient, thanks

## 2017-04-07 NOTE — Telephone Encounter (Signed)
Patient stated she needs it to fly with . She has very bad anxiety while on an airplane. She is going to [employer]. Please advise.

## 2017-04-07 NOTE — Telephone Encounter (Signed)
Spoke with patient. She notes she has very bad anxiety while flying. She tolerated the Valium for her MRI with no difficulties. They leave on the 15th. We'll plan on faxing this in next week. She noted she would prefer a dose of 4 mg instead of 5 mg.

## 2017-04-07 NOTE — Telephone Encounter (Signed)
Left message for patient to return call back.  

## 2017-04-10 ENCOUNTER — Ambulatory Visit
Admission: RE | Admit: 2017-04-10 | Discharge: 2017-04-10 | Disposition: A | Discharge status: Home / Self Care | Payer: Medicare Other | Source: Ambulatory Visit | Attending: Family Medicine | Admitting: Family Medicine

## 2017-04-10 ENCOUNTER — Other Ambulatory Visit: Payer: Self-pay | Admitting: Family Medicine

## 2017-04-10 DIAGNOSIS — M5416 Radiculopathy, lumbar region: Secondary | ICD-10-CM

## 2017-04-10 DIAGNOSIS — M545 Low back pain: Secondary | ICD-10-CM | POA: Diagnosis not present

## 2017-04-10 MED ORDER — IOPAMIDOL (ISOVUE-M 200) INJECTION 41%
1.0000 mL | Freq: Once | INTRAMUSCULAR | Status: AC
  Administered 2017-04-10: 1 mL via EPIDURAL

## 2017-04-10 MED ORDER — DIAZEPAM 2 MG PO TABS: ORAL_TABLET | ORAL | 0 refills | Status: DC

## 2017-04-10 MED ORDER — METHYLPREDNISOLONE ACETATE 40 MG/ML INJ SUSP (RADIOLOG
120.0000 mg | Freq: Once | INTRAMUSCULAR | Status: AC
  Administered 2017-04-10: 120 mg via EPIDURAL

## 2017-04-10 NOTE — Discharge Instructions (Signed)

## 2017-04-10 NOTE — Telephone Encounter (Signed)
Patient notified rx is faxed [facility]

## 2017-04-11 ENCOUNTER — Ambulatory Visit (INDEPENDENT_AMBULATORY_CARE_PROVIDER_SITE_OTHER): Payer: Medicare Other | Admitting: Gastroenterology

## 2017-04-11 ENCOUNTER — Encounter: Payer: Self-pay | Admitting: Gastroenterology

## 2017-04-11 VITALS — BP 129/72 | HR 73 | Temp 98.3°F | Ht 64.0 in | Wt 164.0 lb

## 2017-04-11 DIAGNOSIS — K219 Gastro-esophageal reflux disease without esophagitis: Secondary | ICD-10-CM | POA: Diagnosis not present

## 2017-04-11 DIAGNOSIS — I6529 Occlusion and stenosis of unspecified carotid artery: Secondary | ICD-10-CM

## 2017-04-11 DIAGNOSIS — R1310 Dysphagia, unspecified: Secondary | ICD-10-CM

## 2017-04-11 DIAGNOSIS — R1319 Other dysphagia: Secondary | ICD-10-CM

## 2017-04-11 NOTE — Progress Notes (Signed)
Isabel Bellows MD, MRCP(U.K) [address]  [place]  [place], [place] [address]  Main: [phone]  Fax: [phone]   Primary Care Physician: Leone Haven, MD  Primary Gastroenterologist:  Dr. Jonathon Hernandez   Chief Complaint  Patient presents with  . Dysphagia    follow up- resumed zantac    HPI: Isabel Hernandez is a 76 y.o. female   Isabel Hernandez is a 76 y.o. female here to follow up to her last visit 01/2017 for dysphagia.   Summary of history: She has a history of dysphagia and possible aspiration Pneumonitis. 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 She was seen by Dr Allen Norris in 2017 September for chest pain which he felt were due to esophageal spasms, he increased the dose of her PPI to twice a day . Dysphagia affects solids and liquids  Interval history 01/2017-04/2017   She stopped zantac at night developed dysphagia and hence had to restart. Presently on lasoprazole 15 mg in the morning and zantac wt night with no symptoms.       Current Outpatient Prescriptions  Medication Sig Dispense Refill  . amLODipine (NORVASC) 5 MG tablet TAKE 1 TABLET BY MOUTH ONCE DAILY 90 tablet 0  . aspirin 81 MG tablet Take 81 mg by mouth daily.    . Aspirin-Calcium Carbonate 81-777 MG TABS Take by mouth.    Marland Kitchen atorvastatin (LIPITOR) 20 MG tablet Take 1 tablet (20 mg total) by mouth daily. 90 tablet 3  . azelastine (OPTIVAR) 0.05 % ophthalmic solution Place 1 drop into both eyes as needed.    . Calcium Carbonate (CALTRATE 600 PO) Take 1 tablet by mouth 2 (two) times daily.    . diazepam (VALIUM) 2 MG tablet Take 2 Tablets by mouth twice daily as needed for anxiety 8 tablet 0  . diazepam (VALIUM) 5 MG tablet     . fenofibrate (TRICOR) 145 MG tablet Take 1 tablet (145 mg total) by mouth daily. 90 tablet 3  . fluticasone (FLONASE) 50 MCG/ACT nasal spray Place 1 spray into both nostrils daily.    . lansoprazole (PREVACID)  15 MG capsule TAKE 1 CAPSULE EVERY DAY 30 capsule 5  . levocetirizine (XYZAL) 5 MG tablet Take 5 mg by mouth daily.    Marland Kitchen losartan (COZAAR) 50 MG tablet TAKE ONE TABLET BY MOUTH EVERY DAY 90 tablet 3  . metoprolol tartrate (LOPRESSOR) 25 MG tablet Take 1 tablet (25 mg total) by mouth 2 (two) times daily. 180 tablet 3  . Multiple Vitamin (MULTIVITAMIN WITH MINERALS) TABS tablet Take 1 tablet by mouth daily.    . Omega-3 Fatty Acids (FISH OIL) 1000 MG CAPS Take by mouth.    . Vitamin D, Ergocalciferol, (DRISDOL) 50000 units CAPS capsule Take 1 capsule (50,000 Units total) by mouth every 7 (seven) days. 12 capsule 0  . hydrochlorothiazide (MICROZIDE) 12.5 MG capsule      No current facility-administered medications for this visit.     Allergies as of 04/11/2017 - Review Complete 04/11/2017  Allergen Reaction Noted  . Nitroglycerin Other (See Comments) 02/09/2016  . Penicillins Hives 02/09/2016  . Biaxin [clarithromycin] Rash 02/09/2016  . Latex Rash 02/09/2016  . Lidocaine Swelling 11/25/2016  . Nickel Rash 06/07/2016  . Oxycodone Hives 02/09/2016  . Tetracyclines & related Rash 02/09/2016  . Tizanidine Rash 02/09/2016    ROS:  General: Negative for anorexia, weight loss, fever, chills, fatigue, weakness. ENT: Negative  for hoarseness, difficulty swallowing , nasal congestion. CV: Negative for chest pain, angina, palpitations, dyspnea on exertion, peripheral edema.  Respiratory: Negative for dyspnea at rest, dyspnea on exertion, cough, sputum, wheezing.  GI: See history of present illness. GU:  Negative for dysuria, hematuria, urinary incontinence, urinary frequency, nocturnal urination.  Endo: Negative for unusual weight change.    Physical Examination:   BP 129/72   Pulse 73   Temp 98.3 F (36.8 C) (Oral)   Ht 5\' 4"  (1.626 m)   Wt 164 lb (74.4 kg)   BMI 28.15 kg/m   General: Well-nourished, well-developed in no acute distress.  Eyes: No icterus. Conjunctivae pink. Mouth:  Oropharyngeal mucosa moist and pink , no lesions erythema or exudate. Lungs: Clear to auscultation bilaterally. Non-labored. Heart: Regular rate and rhythm, no murmurs rubs or gallops.  Abdomen: Bowel sounds are normal, nontender, nondistended, no hepatosplenomegaly or masses, no abdominal bruits or hernia , no rebound or guarding.   Extremities: No lower extremity edema. No clubbing or deformities. Neuro: Alert and oriented x 3.  Grossly intact. Skin: Warm and dry, no jaundice.   Psych: Alert and cooperative, normal mood and affect.   Imaging Studies: Dg Inject Diag/thera/inc Needle/cath/plc Epi/lumb/sac W/img  Result Date: 04/10/2017 CLINICAL DATA:  Low back pain. Bilateral radicular symptoms. Displacement of the L4-5 lumbar disc. The patient reports significant improvement and radicular symptoms since the prior exam. Low back pain has recurred. Of note, the patient reports an allergy to lidocaine during an ophthalmology procedure. FLUOROSCOPY TIME:  Radiation Exposure Index (as provided by the fluoroscopic device): 11.68 uGy*m2 Fluoroscopy Time:  12 seconds Number of Acquired Images:  0 PROCEDURE: The procedure, risks, benefits, and alternatives were explained to the patient. Questions regarding the procedure were encouraged and answered. The patient understands and consents to the procedure. LUMBAR EPIDURAL INJECTION: An interlaminar approach was performed on the left at L4-5. The overlying skin was cleansed and anesthetized with tetracaine. A 20 gauge epidural needle was advanced using loss-of-resistance technique. DIAGNOSTIC EPIDURAL INJECTION: Injection of Isovue-M 200 shows a good epidural pattern with spread above and below the level of needle placement, primarily on the left no vascular opacification is seen. THERAPEUTIC EPIDURAL INJECTION: 120 Mg of Depo-Medrol mixed with 1 mL tetracaine were instilled. The procedure was well-tolerated, and the patient was discharged thirty minutes following  the injection in good condition. COMPLICATIONS: None IMPRESSION: Technically successful epidural injection on the left L4-5 # 2 Electronically Signed   By: San Morelle M.D.   On: 04/10/2017 12:45    Assessment and Plan:   Aviyah Swetz is a 76 y.o. y/o female here to follow up for  for dysphagia likely secondary to GERD.Symptoms are controlled on lasoprazole 15 mg in the am and zantac at night . I am unable to wean down any further as she develops symptoms and she can continue on the present dose long term .   Dr Isabel Bellows  MD,MRCP [facility]) Follow up prn

## 2017-04-27 DIAGNOSIS — H04223 Epiphora due to insufficient drainage, bilateral lacrimal glands: Secondary | ICD-10-CM | POA: Diagnosis not present

## 2017-04-28 ENCOUNTER — Encounter: Payer: Self-pay | Admitting: Family Medicine

## 2017-04-28 ENCOUNTER — Ambulatory Visit (INDEPENDENT_AMBULATORY_CARE_PROVIDER_SITE_OTHER): Payer: Medicare Other | Admitting: Family Medicine

## 2017-04-28 DIAGNOSIS — J0100 Acute maxillary sinusitis, unspecified: Secondary | ICD-10-CM | POA: Diagnosis not present

## 2017-04-28 DIAGNOSIS — I6529 Occlusion and stenosis of unspecified carotid artery: Secondary | ICD-10-CM | POA: Diagnosis not present

## 2017-04-28 DIAGNOSIS — J329 Chronic sinusitis, unspecified: Secondary | ICD-10-CM | POA: Insufficient documentation

## 2017-04-28 MED ORDER — LEVOFLOXACIN 500 MG PO TABS: 500.0000 mg | ORAL_TABLET | Freq: Every day | ORAL | 0 refills | Status: DC

## 2017-04-28 NOTE — Patient Instructions (Signed)
Nice to see you. You likely have a sinus infection. We'll treat you with Levaquin. You should take a probiotic with this. If you develop shortness of breath, cough productive of blood, or fevers, or any new or changing symptoms please seek medical attention.

## 2017-04-28 NOTE — Progress Notes (Signed)
Pre visit review using our clinic review tool, if applicable. No additional management support is needed unless otherwise documented below in the visit note. 

## 2017-04-28 NOTE — Progress Notes (Signed)
  Tommi Rumps, MD Phone: [phone]  Isabel Hernandez is a 76 y.o. female who presents today for same-day visit.  Patient notes 1.5 weeks of sinus congestion, cough productive of green mucus, rhinorrhea, some postnasal drip, and feeling poorly. Notes her ribs are sore from coughing though no other pain. No shortness of breath. No fevers. She's not tried any medications for this. She does have possible pneumonitis chronically.  PMH: Former smoker   ROS see history of present illness  Objective  Physical Exam Vitals:   04/28/17 1316  BP: 134/78  Pulse: 74  Temp: 99.3 F (37.4 C)    BP Readings from Last 3 Encounters:  04/28/17 134/78  04/11/17 129/72  04/10/17 (!) 160/86   Wt Readings from Last 3 Encounters:  04/28/17 165 lb (74.8 kg)  04/11/17 164 lb (74.4 kg)  03/13/17 159 lb 12 oz (72.5 kg)    Physical Exam  Constitutional: No distress.  HENT:  Head: Normocephalic and atraumatic.  Mouth/Throat: Oropharynx is clear and moist. No oropharyngeal exudate.  Normal TMs, sinuses tender to percussion in maxillary area  Eyes: Pupils are equal, round, and reactive to light. Conjunctivae are normal.  Neck: Neck supple.  Cardiovascular: Normal rate, regular rhythm and normal heart sounds.   Pulmonary/Chest: Effort normal and breath sounds normal.  Lymphadenopathy:    She has no cervical adenopathy.  Neurological: She is alert. Gait normal.  Skin: She is not diaphoretic.     Assessment/Plan: Please see individual problem list.  Sinusitis Duration and symptoms likely related to sinusitis. Possibly some component of bronchitis. She has allergies to penicillins, tetracyclines, and macrolides. We'll treat with Levaquin. Discussed risk of tendon rupture and neuropathy with Levaquin. Given return precautions.   No orders of the defined types were placed in this encounter.   Meds ordered this encounter  Medications  . levofloxacin (LEVAQUIN) 500 MG tablet    Sig: Take 1  tablet (500 mg total) by mouth daily.    Dispense:  7 tablet    Refill:  0    Tommi Rumps, MD [place]

## 2017-04-28 NOTE — Assessment & Plan Note (Signed)
Duration and symptoms likely related to sinusitis. Possibly some component of bronchitis. She has allergies to penicillins, tetracyclines, and macrolides. We'll treat with Levaquin. Discussed risk of tendon rupture and neuropathy with Levaquin. Given return precautions.

## 2017-05-04 DIAGNOSIS — L538 Other specified erythematous conditions: Secondary | ICD-10-CM | POA: Diagnosis not present

## 2017-05-04 DIAGNOSIS — L821 Other seborrheic keratosis: Secondary | ICD-10-CM | POA: Diagnosis not present

## 2017-05-04 DIAGNOSIS — L820 Inflamed seborrheic keratosis: Secondary | ICD-10-CM | POA: Diagnosis not present

## 2017-05-04 DIAGNOSIS — L570 Actinic keratosis: Secondary | ICD-10-CM | POA: Diagnosis not present

## 2017-05-04 DIAGNOSIS — X32XXXA Exposure to sunlight, initial encounter: Secondary | ICD-10-CM | POA: Diagnosis not present

## 2017-05-09 ENCOUNTER — Ambulatory Visit (INDEPENDENT_AMBULATORY_CARE_PROVIDER_SITE_OTHER): Payer: Medicare Other | Admitting: Family Medicine

## 2017-05-09 ENCOUNTER — Encounter: Payer: Self-pay | Admitting: Family Medicine

## 2017-05-09 ENCOUNTER — Inpatient Hospital Stay: Admission: RE | Admit: 2017-05-09 | Payer: Medicare Other | Source: Ambulatory Visit

## 2017-05-09 VITALS — BP 130/80 | HR 70 | Temp 98.7°F | Wt 165.0 lb

## 2017-05-09 DIAGNOSIS — R251 Tremor, unspecified: Secondary | ICD-10-CM | POA: Diagnosis not present

## 2017-05-09 DIAGNOSIS — R252 Cramp and spasm: Secondary | ICD-10-CM

## 2017-05-09 DIAGNOSIS — I6529 Occlusion and stenosis of unspecified carotid artery: Secondary | ICD-10-CM

## 2017-05-09 DIAGNOSIS — J0100 Acute maxillary sinusitis, unspecified: Secondary | ICD-10-CM | POA: Diagnosis not present

## 2017-05-09 LAB — BASIC METABOLIC PANEL
BUN: 23 mg/dL (ref 6–23)
CALCIUM: 9.9 mg/dL (ref 8.4–10.5)
CHLORIDE: 101 meq/L (ref 96–112)
CO2: 29 mEq/L (ref 19–32)
CREATININE: 0.96 mg/dL (ref 0.40–1.20)
GFR: 59.82 mL/min — ABNORMAL LOW (ref >60.00)
Glucose, Bld: 99 mg/dL (ref 70–99)
Potassium: 4.6 mEq/L (ref 3.5–5.1)
Sodium: 136 mEq/L (ref 135–145)

## 2017-05-09 MED ORDER — FLUTICASONE PROPIONATE 50 MCG/ACT NA SUSP: 2.0000 | Freq: Every day | NASAL | 6 refills | Status: DC

## 2017-05-09 MED ORDER — LORATADINE 10 MG PO TABS: 10.0000 mg | ORAL_TABLET | Freq: Every day | ORAL | 11 refills | Status: DC

## 2017-05-09 NOTE — Progress Notes (Signed)
Tommi Rumps, MD Phone: [phone]  Isabel Hernandez is a 76 y.o. female who presents today for follow-up.  Patient previously seen for sinus infection. She has improved since taking antibiotics though still notes some rhinorrhea and mild postnasal drip with some hoarseness. She notes her congestion is significantly improved. Cough is not as productive. No fevers or shortness of breath. She's not been taking any other medications for this.  She reports on a couple of occasions having spasms in her hand and has also had spasms in her feet and legs. Notes the first time in her left second through fourth fingers locked up and became painful and then the second time her right pinky finger locked up and became painful. She stays well-hydrated.  She also notes slight resting tremor in the left hand. This has been going on just recently. She notes no gait disturbance. She does have a family history of Parkinson's disease and is worried about this.  PMH: Former smoker   ROS see history of present illness  Objective  Physical Exam Vitals:   05/09/17 1131  BP: 130/80  Pulse: 70  Temp: 98.7 F (37.1 C)    BP Readings from Last 3 Encounters:  05/09/17 130/80  04/28/17 134/78  04/11/17 129/72   Wt Readings from Last 3 Encounters:  05/09/17 165 lb (74.8 kg)  04/28/17 165 lb (74.8 kg)  04/11/17 164 lb (74.4 kg)    Physical Exam  Constitutional: No distress.  HENT:  Head: Normocephalic and atraumatic.  Mouth/Throat: Oropharynx is clear and moist. No oropharyngeal exudate.  Normal TMs  Eyes: Pupils are equal, round, and reactive to light. Conjunctivae are normal.  Cardiovascular: Normal rate, regular rhythm and normal heart sounds.   Pulmonary/Chest: Effort normal and breath sounds normal.  Neurological: She is alert.  CN 2-12 intact, 5/5 strength in bilateral biceps, triceps, grip, quads, hamstrings, plantar and dorsiflexion, sensation to light touch intact in bilateral UE and LE,  normal gait, slight cogwheel rigidity with movement of bilateral elbows throughout extension range of motion, slight resting tremor noted on the left  Skin: Skin is warm and dry. She is not diaphoretic.     Assessment/Plan: Please see individual problem list.  Sinusitis Improved significantly. Discussed that her current symptoms would not warrant repeat antibiotics. Suspect post sinusitis cough and possible allergy component. She'll try Flonase and Claritin. If not continuing to improve or if she worsens she'll let us know.  Tremor Potentially could be benign tremor though given resting component and possible cogwheeling with a history of Parkinson's in her family will refer to neurology for evaluation.  Hand or foot spasms Occasional spasms. Discussed staying well-hydrated. We'll check a BNP. If negative potentially could be related to her tremor and she has been referred to neurology.   Orders Placed This Encounter  Procedures  . Basic Metabolic Panel (BMET)  . Ambulatory referral to Neurology    Referral Priority:   Routine    Referral Type:   Consultation    Referral Reason:   Specialty Services Required    Requested Specialty:   Neurology    Number of Visits Requested:   1    Meds ordered this encounter  Medications  . fluticasone (FLONASE) 50 MCG/ACT nasal spray    Sig: Place 2 sprays into both nostrils daily.    Dispense:  16 g    Refill:  6  . loratadine (CLARITIN) 10 MG tablet    Sig: Take 1 tablet (10 mg total) by mouth daily.  Dispense:  30 tablet    Refill:  [place], MD [place]

## 2017-05-09 NOTE — Assessment & Plan Note (Signed)
Potentially could be benign tremor though given resting component and possible cogwheeling with a history of Parkinson's in her family will refer to neurology for evaluation.

## 2017-05-09 NOTE — Patient Instructions (Signed)
Nice to see you. We will start you on Flonase and Claritin for your persistent symptoms. If your symptoms do not continue to improve please let us know. We'll check lab work for the spasms. Given your tremor we will refer you to neurology.

## 2017-05-09 NOTE — Assessment & Plan Note (Signed)
Improved significantly. Discussed that her current symptoms would not warrant repeat antibiotics. Suspect post sinusitis cough and possible allergy component. She'll try Flonase and Claritin. If not continuing to improve or if she worsens she'll let us know.

## 2017-05-09 NOTE — Assessment & Plan Note (Signed)
Occasional spasms. Discussed staying well-hydrated. We'll check a BNP. If negative potentially could be related to her tremor and she has been referred to neurology.

## 2017-05-10 ENCOUNTER — Encounter: Payer: Self-pay | Admitting: Neurology

## 2017-05-16 ENCOUNTER — Ambulatory Visit
Admission: RE | Admit: 2017-05-16 | Discharge: 2017-05-16 | Disposition: A | Discharge status: Home / Self Care | Payer: Medicare Other | Source: Ambulatory Visit | Attending: Obstetrics & Gynecology | Admitting: Obstetrics & Gynecology

## 2017-05-16 DIAGNOSIS — Z1231 Encounter for screening mammogram for malignant neoplasm of breast: Secondary | ICD-10-CM | POA: Insufficient documentation

## 2017-05-23 ENCOUNTER — Ambulatory Visit (INDEPENDENT_AMBULATORY_CARE_PROVIDER_SITE_OTHER): Payer: Medicare Other | Admitting: Family Medicine

## 2017-05-23 ENCOUNTER — Encounter: Payer: Self-pay | Admitting: Family Medicine

## 2017-05-23 VITALS — BP 140/78 | HR 62 | Ht 64.0 in | Wt 164.0 lb

## 2017-05-23 DIAGNOSIS — M47816 Spondylosis without myelopathy or radiculopathy, lumbar region: Secondary | ICD-10-CM | POA: Insufficient documentation

## 2017-05-23 DIAGNOSIS — M48061 Spinal stenosis, lumbar region without neurogenic claudication: Secondary | ICD-10-CM

## 2017-05-23 DIAGNOSIS — M4696 Unspecified inflammatory spondylopathy, lumbar region: Secondary | ICD-10-CM

## 2017-05-23 DIAGNOSIS — I6529 Occlusion and stenosis of unspecified carotid artery: Secondary | ICD-10-CM

## 2017-05-23 MED ORDER — GABAPENTIN 100 MG PO CAPS: 200.0000 mg | ORAL_CAPSULE | Freq: Every day | ORAL | 3 refills | Status: DC

## 2017-05-23 NOTE — Progress Notes (Signed)
[facility] [place], [place] [address] Phone: [phone] Subjective:    I'm seeing this patient by the request  of:    CC:   [id number]  Isabel Hernandez is a 76 y.o. female coming in with complaint of back pain. Her last epidural was on July 9th, 2018. She has very little relief from that injection and would like to know what the next step will be. She notes pain with trunk flexion and pain at her hip flexors. She feels like she is in constant pain as of recent.  Patient has been doing relatively well but states unfortunate now affecting daily activities.     Past Medical History:  Diagnosis Date  . A-fib ([place])   . Arthritis   . Breast cancer ([place]) 2002   RT LUMPECTOMY  . Bronchitis   . Carotid artery narrowings   . 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 Carotid artery occlusion    RIGHT 50% BLOCKAGE  . Colon polyps   . Complication of anesthesia    shaking/ FOLLOWING LOCAL AT DENTIST , drop O2 sats TO 50% DURING COLONOSCOPY  . Coronary artery disease   . Diverticulosis   . Dysrhythmia   . Environmental allergies   . GERD (gastroesophageal reflux disease)   . History of hiatal hernia   . Hyperlipidemia   . Hypertension   . IBS (irritable bowel syndrome)   . Mitral valve disorder   . Shortness of breath dyspnea   . Syncope and collapse   . UTI (lower urinary tract infection)    Past Surgical History:  Procedure Laterality Date  . APPENDECTOMY    . BREAST BIOPSY Right 2002   POS  . BREAST CYST ASPIRATION Right   . BREAST EXCISIONAL BIOPSY Left 1997   NEG  . CATARACT EXTRACTION W/PHACO Right 06/07/2016   Procedure: CATARACT EXTRACTION PHACO AND INTRAOCULAR LENS PLACEMENT (IOC);  Surgeon: Birder Robson, MD;  Location: [facility] ORS;  Service: Ophthalmology;  Laterality: Right;  [place] 00:51 AP% 19.6 CDE 10.09 Fluid pack lot # [id number]  . CATARACT EXTRACTION W/PHACO Left 07/05/2016   Procedure: CATARACT EXTRACTION PHACO AND  INTRAOCULAR LENS PLACEMENT (IOC);  Surgeon: Birder Robson, MD;  Location: [facility] ORS;  Service: Ophthalmology;  Laterality: Left;  [place] 00:38 AP% 24.9 CDE 9.60 Fluid Pack lot # [id number]  . CESAREAN SECTION    . TONSILLECTOMY    . TOTAL KNEE ARTHROPLASTY Left    Social History   Social History  . Marital status: Married    Spouse name: N/A  . Number of children: N/A  . Years of education: N/A   Social History Main Topics  . Smoking status: Former Smoker    Years: 10.00    Types: Cigarettes  . Smokeless tobacco: Never Used  . Alcohol use 0.0 oz/week     Comment: couple drinks/week  . Drug use: No  . Sexual activity: Not Asked   Other Topics Concern  . None   Social History Narrative  . None   Allergies  Allergen Reactions  . Nitroglycerin Other (See Comments)    Can only use the patch  Turned lobster red and felt faint with sublingual NTG  . Penicillins Hives    Hives Has patient had a PCN reaction causing immediate rash, facial/tongue/throat swelling, SOB or lightheadedness with hypotension: YES Has patient had a PCN reaction causing severe rash involving mucus membranes or skin necrosis: NO Has patient had a PCN  reaction that required hospitalization NO Has patient had a PCN reaction occurring within the last 10 years: No If all of the above answers are "NO", then may proceed with Cephalosporin use.  . Biaxin [Clarithromycin] Rash    Rash   . Latex Rash  . Lidocaine Swelling    Eye swelling Tolerates Tetracaine with epidural steroid injections (04/10/17)  . Nickel Rash  . Oxycodone Hives  . Tetracyclines & Related Rash       . Tizanidine Rash   Family History  Problem Relation Age of Onset  . Lung cancer Father   . Prostate cancer Father   . Hyperlipidemia Brother   . Heart disease Mother   . Lung cancer Sister   . Hypertension Brother   . Kidney disease Unknown        Maternal grandparent  . Diabetes Brother   . Breast cancer Neg Hx      Past  medical history, social, surgical and family history all reviewed in electronic medical record.  No pertanent information unless stated regarding to the chief complaint.   Review of Systems:Review of systems updated and as accurate as of 05/23/17  No headache, visual changes, nausea, vomiting, diarrhea, constipation, dizziness, abdominal pain, skin rash, fevers, chills, night sweats, weight loss, swollen lymph nodes, body aches, joint swelling,  chest pain, shortness of breath, mood changes. Positive muscle aches  Objective  Blood pressure 140/78, pulse 62, height 5\' 4"  (1.626 m), weight 164 lb (74.4 kg). Systems examined below as of 05/23/17   General: No apparent distress alert and oriented x3 mood and affect normal, dressed appropriately.  HEENT: Pupils equal, extraocular movements intact  Respiratory: Patient's speak in full sentences and does not appear short of breath  Cardiovascular: No lower extremity edema, non tender, no erythema  Skin: Warm dry intact with no signs of infection or rash on extremities or on axial skeleton.  Abdomen: Soft nontender  Neuro: Cranial nerves II through XII are intact, neurovascularly intact in all extremities with 2+ DTRs and 2+ pulses.  Lymph: No lymphadenopathy of posterior or anterior cervical chain or axillae bilaterally.  Gait normal with good balance and coordination.  MSK:  Non tender with full range of motion and good stability and symmetric strength and tone of shoulders, elbows, wrist, hip, knee and ankles bilaterally. Arthritic changes of multiple joints Back Exam:  Inspection: Mild loss in lordosis Motion: Flexion 45 deg, Extension 25 deg, Side Bending to 35 deg bilaterally,  Rotation to 30 deg bilaterally  SLR laying: Negative  XSLR laying: Negative  Palpable tenderness: Tender to palpation and appears palmar musculature lumbar spine mostly over L5-S1 bilaterally. FABER: Tightness bilaterally. Sensory change: Gross sensation intact to all  lumbar and sacral dermatomes.  Reflexes: 2+ at both patellar tendons, 2+ at achilles tendons, Babinski's downgoing.  Strength at foot  Plantar-flexion: 5/5 Dorsi-flexion: 5/5 Eversion: 5/5 Inversion: 5/5  Leg strength  Quad: 5/5 Hamstring: 5/5 Hip flexor: 5/5 Hip abductors: 5/5  Gait unremarkable.    Impression and Recommendations:     This case required medical decision making of moderate complexity.      Note: This dictation was prepared with Dragon dictation along with smaller phrase technology. Any transcriptional errors that result from this process are unintentional.

## 2017-05-23 NOTE — Assessment & Plan Note (Signed)
Worsening symptoms again. 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 Patient has not made significant improvement with the 2 previous epidurals and will try bilateral facet injections. Discussed icing regimen, home exercises, discussed increasing activity slowly. We also discussed other treatment options such as referral for radiofrequency ablation or spinal cord stimulator. Patient wants to avoid any type of surgery. Started gabapentin as well as warned the potential side effects. Follow-up again in 2 weeks after the bilateral facet injections

## 2017-05-23 NOTE — Patient Instructions (Signed)
Good to see you gabapentin 200mg  at night  We will get bilateral facet injection  Ice is your friend.  Send me a message 2 weeks after and tell me how you are doing  If well, great then see me again in 6 weeks If not then we will see if Dr. Maryjean Ka can help [place].

## 2017-06-02 NOTE — Progress Notes (Signed)
Isabel Hernandez was seen today in the movement disorders clinic for neurologic consultation at the request of Leone Haven, MD.  The consultation is for the evaluation of L hand tremor and to r/o PD.  This patient is accompanied in the office by her spouse who supplements the history.   The records that were made available to me were reviewed.  Specific Symptoms:  Tremor: Yes.  , L hand intermittently x 3-4 months.  Only happened in 3 episodes when arm was resting and in one episode it was just the finger.  She has noted some intermittent cramping of the fingers, R hand as well, and some in the L foot.  She is unsure if that is related. Family hx of similar:  Yes.  , dads sister had PD Voice: no.   Sleep: sleeping well.  Does have nocturia  Vivid Dreams:  No.  Acting out dreams:  Rarely will she moan Wet Pillows: No. Postural symptoms:  Yes.    Falls?  No. Bradykinesia symptoms: slow movements Loss of smell:  No. Loss of taste:  No. Urinary Incontinence:  Yes.  , some "leakage" Difficulty Swallowing:  Yes.  , esophagus has been "stretched" several times, last time in 12/2015 in [place] (done x 3) Handwriting, micrographia: maybe a little smaller Trouble with ADL's:  No.  Trouble buttoning clothing: No. Depression:  No. Memory changes:  No. Hallucinations:  No.  visual distortions: No. N/V:  No. Lightheaded:  Yes.  , minor dizzy  Syncope: No. Diplopia:  No. Dyskinesia:  No.  Neuroimaging of the brain has not previously been performed.      ALLERGIES:   Allergies  Allergen Reactions  . Nitroglycerin Other (See Comments)    Can only use the patch  Turned lobster red and felt faint with sublingual NTG  . Penicillins Hives    Hives Has patient had a PCN reaction causing immediate rash, facial/tongue/throat swelling, SOB or lightheadedness with hypotension: YES Has patient had a PCN reaction causing severe rash involving mucus membranes or skin necrosis: NO Has patient had  a PCN reaction that required hospitalization NO Has patient had a PCN reaction occurring within the last 10 years: No If all of the above answers are "NO", then may proceed with Cephalosporin use.  . Biaxin [Clarithromycin] Rash    Rash   . Latex Rash  . Lidocaine Swelling    Eye swelling Tolerates Tetracaine with epidural steroid injections (04/10/17)  . Nickel Rash  . Oxycodone Hives  . Tetracyclines & Related Rash       . Tizanidine Rash    CURRENT MEDICATIONS:  Outpatient Encounter Prescriptions as of 06/06/2017  Medication Sig  . amLODipine (NORVASC) 5 MG tablet TAKE 1 TABLET BY MOUTH ONCE DAILY  . atorvastatin (LIPITOR) 20 MG tablet Take 1 tablet (20 mg total) by mouth daily.  . fenofibrate (TRICOR) 145 MG tablet Take 1 tablet (145 mg total) by mouth daily.  . fluticasone (FLONASE) 50 MCG/ACT nasal spray Place 2 sprays into both nostrils daily.  Marland Kitchen losartan (COZAAR) 50 MG tablet TAKE ONE TABLET BY MOUTH EVERY DAY  . metoprolol tartrate (LOPRESSOR) 25 MG tablet Take 1 tablet (25 mg total) by mouth 2 (two) times daily.  . Multiple Vitamin (MULTIVITAMIN WITH MINERALS) TABS tablet Take 1 tablet by mouth daily.  . [DISCONTINUED] aspirin 81 MG tablet Take 81 mg by mouth daily.  . [DISCONTINUED] Calcium Carbonate (CALTRATE 600 PO) Take 1 tablet by mouth 2 (two) times  daily.  . [DISCONTINUED] gabapentin (NEURONTIN) 100 MG capsule Take 2 capsules (200 mg total) by mouth at bedtime.  . [DISCONTINUED] lansoprazole (PREVACID) 15 MG capsule TAKE 1 CAPSULE EVERY DAY  . [DISCONTINUED] loratadine (CLARITIN) 10 MG tablet Take 1 tablet (10 mg total) by mouth daily.  . [DISCONTINUED] Omega-3 Fatty Acids (FISH OIL) 1000 MG CAPS Take by mouth.   No facility-administered encounter medications on file as of 06/06/2017.     PAST MEDICAL HISTORY:   Past Medical History:  Diagnosis Date  . A-fib ([place])   . Arthritis   . Breast cancer ([place]) 2002   RT LUMPECTOMY  . Bronchitis   . Carotid artery  narrowings   . 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 Carotid artery occlusion    RIGHT 50% BLOCKAGE  . Colon polyps   . Complication of anesthesia    shaking/ FOLLOWING LOCAL AT DENTIST , drop O2 sats TO 50% DURING COLONOSCOPY  . Coronary artery disease   . Diverticulosis   . Dysrhythmia   . Environmental allergies   . GERD (gastroesophageal reflux disease)   . History of hiatal hernia   . Hyperlipidemia   . Hypertension   . IBS (irritable bowel syndrome)   . Mitral valve disorder   . Shortness of breath dyspnea   . Syncope and collapse   . UTI (lower urinary tract infection)     PAST SURGICAL HISTORY:   Past Surgical History:  Procedure Laterality Date  . APPENDECTOMY    . BREAST BIOPSY Right 2002   POS  . BREAST CYST ASPIRATION Right   . BREAST EXCISIONAL BIOPSY Left 1997   NEG  . BREAST LUMPECTOMY    . CATARACT EXTRACTION W/PHACO Right 06/07/2016   Procedure: CATARACT EXTRACTION PHACO AND INTRAOCULAR LENS PLACEMENT (IOC);  Surgeon: Birder Robson, MD;  Location: [facility] ORS;  Service: Ophthalmology;  Laterality: Right;  [place] 00:51 AP% 19.6 CDE 10.09 Fluid pack lot # [id number]  . CATARACT EXTRACTION W/PHACO Left 07/05/2016   Procedure: CATARACT EXTRACTION PHACO AND INTRAOCULAR LENS PLACEMENT (IOC);  Surgeon: Birder Robson, MD;  Location: [facility] ORS;  Service: Ophthalmology;  Laterality: Left;  [place] 00:38 AP% 24.9 CDE 9.60 Fluid Pack lot # [id number]  . CESAREAN SECTION    . TONSILLECTOMY    . TOTAL KNEE ARTHROPLASTY Left     SOCIAL HISTORY:   Social History   Social History  . Marital status: Married    Spouse name: N/A  . Number of children: N/A  . Years of education: N/A   Occupational History  . Not on file.   Social History Main Topics  . Smoking status: Former Smoker    Years: 10.00    Types: Cigarettes    Quit date: 10/03/1969  . Smokeless tobacco: Never Used  . Alcohol use 0.0 oz/week     Comment: couple drinks/week  . Drug use: No  . Sexual  activity: Not on file   Other Topics Concern  . Not on file   Social History Narrative  . No narrative on file    FAMILY HISTORY:   Family Status  Relation Status  . Father Deceased  . Brother Deceased  . Mother Deceased  . Sister Alive  . Unknown (Not Specified)  . Child Alive  . Neg Hx (Not Specified)    ROS:  Has LBP and told bulging disc that is causing groin pain bilaterally.  Has chronic SOB - states that unknown etiology despite seeing physicians.  Denies neck pain.  A complete 10 system review of systems was obtained and was unremarkable apart from what is mentioned above.  PHYSICAL EXAMINATION:    VITALS:   Vitals:   06/06/17 1251  BP: (!) 90/56  Pulse: 75  SpO2: 94%  Weight: 165 lb (74.8 kg)  Height: 5\' 4"  (1.626 m)    GEN:  The patient appears stated age and is in NAD. HEENT:  Normocephalic, atraumatic.  The mucous membranes are moist. The superficial temporal arteries are without ropiness or tenderness. CV:  RRR Lungs:  CTAB Neck/HEME:  There are no carotid bruits bilaterally.  There is limited ROM with rotation of the neck and she twists at the waist in order to turn to talk to husband next to her.  Neurological examination:  Orientation: The patient is alert and oriented x3. Fund of knowledge is appropriate.  Recent and remote memory are intact.  Attention and concentration are normal.    Able to name objects and repeat phrases. Cranial nerves: There is good facial symmetry. Pupils are equal round and reactive to light bilaterally. Fundoscopic exam reveals clear margins bilaterally. Extraocular muscles are intact. The visual fields are full to confrontational testing. The speech is fluent and clear. Soft palate rises symmetrically and there is no tongue deviation. Hearing is intact to conversational tone. Sensation: Sensation is intact to light and pinprick throughout (facial, trunk, extremities). Pinprick is not decreased in a distal fashion.  Vibration is  intact at the bilateral big toe but it is decreased distally. There is no extinction with double simultaneous stimulation. There is no sensory dermatomal level identified. Motor: Strength is 5/5 in the bilateral upper and lower extremities.   Shoulder shrug is equal and symmetric.  There is no pronator drift. Deep tendon reflexes: Deep tendon reflexes are 2/4 at the bilateral biceps, triceps, brachioradialis, patella and achilles. Plantar responses are downgoing bilaterally.  Movement examination: Tone: There is normal tone in the bilateral upper extremities.  The tone in the lower extremities is normal.  Abnormal movements: none even with distraction.  No postural or intention tremor.   Coordination:  There is no decremation with RAM's, with any form of RAMS, including alternating supination and pronation of the forearm, hand opening and closing, finger taps, heel taps and toe taps. Gait and Station: The patient has no  difficulty arising out of a deep-seated chair without the use of the hands. The patient's stride length is normal with slight stooped posture and just slight decreased arm swing on the left.    LABS    Chemistry      Component Value Date/Time   NA 136 05/09/2017 1146   K 4.6 05/09/2017 1146   CL 101 05/09/2017 1146   CO2 29 05/09/2017 1146   BUN 23 05/09/2017 1146   CREATININE 0.96 05/09/2017 1146      Component Value Date/Time   CALCIUM 9.9 05/09/2017 1146   ALKPHOS 30 (L) 01/20/2017 1405   AST 36 01/20/2017 1405   ALT 20 01/20/2017 1405   BILITOT 1.2 01/20/2017 1405   BILITOT 1.0 09/05/2016 1410     No results found for: TSH  No results found for: VITAMINB12   ASSESSMENT/PLAN:  1.  Tremor, by history  -I saw no tremor today.  She reports only 3 or 4 episodes of tremor.  She does not meet criteria for idiopathic Parkinson's disease.  I am going to go ahead and draw a TSH today to make sure that we are not  missing anything.  I am also going to follow her up in  about 6-8 months just to make sure that this is not the beginning of a neurodegenerative process (discussed those s/s).  She is quite stiff axially, but otherwise has absolutely no rigidity.   2.  HTN  -BP low today but on 3 different BP meds.  She is starting to feel dizzy.  I asked her to check her blood pressure at home.  If it remains a slow, she will contact her primary care physician and potentially discuss tapering some medication.  3.  If she notices any new symptoms or progression of her symptoms before I see her back, she is to call me before our next visit.  Otherwise, I plan to see the patient back in 6-8 months.  I spent greater than 50% of the 45 minute visit in counseling with the patient and her husband.  Cc:  Leone Haven, MD

## 2017-06-06 ENCOUNTER — Encounter: Payer: Self-pay | Admitting: Neurology

## 2017-06-06 ENCOUNTER — Ambulatory Visit: Payer: Medicare Other | Admitting: Family Medicine

## 2017-06-06 ENCOUNTER — Other Ambulatory Visit: Payer: Medicare Other

## 2017-06-06 ENCOUNTER — Ambulatory Visit (INDEPENDENT_AMBULATORY_CARE_PROVIDER_SITE_OTHER): Payer: Medicare Other | Admitting: Neurology

## 2017-06-06 VITALS — BP 90/56 | HR 75 | Ht 64.0 in | Wt 165.0 lb

## 2017-06-06 DIAGNOSIS — R251 Tremor, unspecified: Secondary | ICD-10-CM

## 2017-06-06 DIAGNOSIS — I6529 Occlusion and stenosis of unspecified carotid artery: Secondary | ICD-10-CM

## 2017-06-06 DIAGNOSIS — I10 Essential (primary) hypertension: Secondary | ICD-10-CM

## 2017-06-06 NOTE — Patient Instructions (Signed)
1. Your provider has requested that you have labwork completed today. Please go to [facility] ([id number]) on the second floor of this building before leaving the office today. You do not need to check in. If you are not called within 15 minutes please check with the front desk.   

## 2017-06-07 ENCOUNTER — Telehealth: Payer: Self-pay | Admitting: Neurology

## 2017-06-07 ENCOUNTER — Other Ambulatory Visit: Payer: Self-pay | Admitting: Cardiovascular Disease

## 2017-06-07 LAB — TSH: TSH: 1.9 mIU/L

## 2017-06-07 NOTE — Telephone Encounter (Signed)
-----   Message from [place], DO sent at 06/07/2017  7:27 AM EDT ----- I have reviewed all lab results which are normal or stable. Please inform the patient.

## 2017-06-07 NOTE — Telephone Encounter (Signed)
Mychart message sent to patient.

## 2017-06-09 ENCOUNTER — Ambulatory Visit
Admission: RE | Admit: 2017-06-09 | Discharge: 2017-06-09 | Disposition: A | Discharge status: Home / Self Care | Payer: Medicare Other | Source: Ambulatory Visit | Attending: Family Medicine | Admitting: Family Medicine

## 2017-06-09 DIAGNOSIS — M545 Low back pain: Secondary | ICD-10-CM | POA: Diagnosis not present

## 2017-06-09 DIAGNOSIS — M48061 Spinal stenosis, lumbar region without neurogenic claudication: Secondary | ICD-10-CM

## 2017-06-09 MED ORDER — METHYLPREDNISOLONE ACETATE 40 MG/ML INJ SUSP (RADIOLOG
120.0000 mg | Freq: Once | INTRAMUSCULAR | Status: AC
  Administered 2017-06-09: 120 mg via INTRA_ARTICULAR

## 2017-06-09 MED ORDER — IOPAMIDOL (ISOVUE-M 200) INJECTION 41%
1.0000 mL | Freq: Once | INTRAMUSCULAR | Status: AC
  Administered 2017-06-09: 1 mL via INTRA_ARTICULAR

## 2017-06-09 NOTE — Discharge Instructions (Signed)

## 2017-06-13 ENCOUNTER — Ambulatory Visit
Admission: RE | Admit: 2017-06-13 | Discharge: 2017-06-13 | Disposition: A | Discharge status: Home / Self Care | Payer: Medicare Other | Source: Ambulatory Visit | Attending: Internal Medicine | Admitting: Internal Medicine

## 2017-06-13 DIAGNOSIS — I2510 Atherosclerotic heart disease of native coronary artery without angina pectoris: Secondary | ICD-10-CM | POA: Diagnosis not present

## 2017-06-13 DIAGNOSIS — J8410 Pulmonary fibrosis, unspecified: Secondary | ICD-10-CM | POA: Diagnosis not present

## 2017-06-13 DIAGNOSIS — J42 Unspecified chronic bronchitis: Secondary | ICD-10-CM | POA: Insufficient documentation

## 2017-06-13 DIAGNOSIS — R911 Solitary pulmonary nodule: Secondary | ICD-10-CM | POA: Insufficient documentation

## 2017-06-13 DIAGNOSIS — I700 Atherosclerosis of aorta: Secondary | ICD-10-CM | POA: Insufficient documentation

## 2017-06-13 NOTE — Progress Notes (Signed)
[facility] Consultation      Assessment and Plan:  The patient is a 76 year old female with chronic dysphagia and aspiration pneumonitis, and a lung nodule.  GERD with dysphagia and aspiration.  --Discussed strategies to reduce reflux including avoiding fatty meals, and not eating 4 hours before bedtime.  -The patient has a history of esophageal narrowing/stricture, for which she has undergone a esophageal dilatation in early 2017.  --Was seen by gastroenterology, started on lansoprazole, which was not tolerated, she also cannot tolerate Protonix. She is currently taking ranitidine when necessary. --Recommend using omeprazole 20 mg once nightly, this is not tolerated. I asked that she continue on the ranitidine nightly indefinitely. This is to try to reduce her reflux, and hopefully this will make her breathing better 10 years from now than it otherwise would be.  Lung nodule. -Repeat CT scan shows a calcified granuloma, stable lung nodule for greater than one year. -Discussed with patient. There is no further need to follow up this nodule.  Asthma and/or aspiration pneumonitis.  -I suspect that her dyspnea  is predominantly due to aspiration pneumonitis secondary to esophageal stasis and reflux. --did not respond to trial of various inhalers --I suspect that this is secondary to aspiration pneumonitis due to Esophageal narrowing, with reflux. --Review of CT chest shows no primary lung pathology to explain her dyspnea. Echo is normal. PFT is normal.  Continue with antireflux measures.    Date: 06/13/2017  MRN# [id number] Isabel Hernandez 1941-06-24   Isabel Hernandez is a 76 y.o. old female seen in consultation for chief complaint of:    Chief Complaint  Patient presents with  . Follow-up    CT results: SOB most of the time:     HPI:   The patient is a 76 year old female who previously smoked for 10 years and quit in 1971. Lung functions and PFT were within normal  limits. It was suspected that she was having dyspnea due to chronic aspiration pneumonitis with esophageal narrowing.  Since her last visit she has seen Dr. Vicente Males, and started on lansoprazole,  and zantac , she is sleeping with head elevation. She continues to have some dyspnea which is about the same. She takes no inhalers, she has been on inhalers in the past but noticed no difference.  She denies joint pain other than back pain. She stopped the PPI because of side effects, she continues to take zantac prn.    CT chest 06/13/17 , compared with 05/03/16 personally reviewed by me:   59mm nodule noted in right lung. There is some scattered infiltrates at the bases bilaterally. These are extremely subtle. No other abnormalities noted and no significant change from scan from 1 year prior.  Review of outside chest x-ray report 12/01/15 left base granuloma seen, new since previous study on 07/17/14. No current films are available for review.   Echocardiogram in 2015 showed normal LV systolic function with an ejection fraction of 65%, mild mitral regurgitation, mild tricuspid regurgitation with mild pulmonary hypertension.   PFT 05/02/16: Spirometry FVC was 99% of predicted, FEV1 was 97% predicted, there is no significant improvement with bronchodilator. FEV to FVC ratio was 73%. Lung volumes. Residual volume was 83% of predicted, RV/TLC ratio is 112% of predicted, total lung capacity was 74% of predicted. Diffusion: Diffusion capacity was 95% of predicted. Flow volume loop was normal. Interpretation: Overall this test shows normal pulmonary functions without evidence of obstructive lung disease.  Medication:    Current Outpatient Prescriptions:  .  amLODipine (NORVASC) 5 MG tablet, TAKE ONE TABLET BY MOUTH EVERY DAY, Disp: 90 tablet, Rfl: 2 .  atorvastatin (LIPITOR) 20 MG tablet, Take 1 tablet (20 mg total) by mouth daily., Disp: 90 tablet, Rfl: 3 .  fenofibrate (TRICOR) 145 MG tablet, Take 1 tablet (145  mg total) by mouth daily., Disp: 90 tablet, Rfl: 3 .  fluticasone (FLONASE) 50 MCG/ACT nasal spray, Place 2 sprays into both nostrils daily., Disp: 16 g, Rfl: 6 .  losartan (COZAAR) 50 MG tablet, TAKE ONE TABLET BY MOUTH EVERY DAY, Disp: 90 tablet, Rfl: 3 .  metoprolol tartrate (LOPRESSOR) 25 MG tablet, Take 1 tablet (25 mg total) by mouth 2 (two) times daily., Disp: 180 tablet, Rfl: 3 .  Multiple Vitamin (MULTIVITAMIN WITH MINERALS) TABS tablet, Take 1 tablet by mouth daily., Disp: , Rfl:    Allergies:  Nitroglycerin; Penicillins; Biaxin [clarithromycin]; Latex; Lidocaine; Nickel; Oxycodone; Tetracyclines & related; and Tizanidine  Review of Systems: Gen:  Denies  fever, sweats, chills HEENT: Denies blurred vision, double vision. bleeds, Cvc:  No dizziness, chest pain. Resp:   Denies cough or sputum production. Gi: Denies swallowing difficulty, stomach pain. Gu:  Denies bladder incontinence, burning urine Ext:   No Joint pain, stiffness. Skin: No skin rash,  hives  Endoc:  No polyuria, polydipsia. Psych: No depression, insomnia. Other:  All other systems were reviewed with the patient and were negative other that what is mentioned in the HPI.   Physical Examination:   VS: BP 118/78 (BP Location: Left Arm, Cuff Size: Normal)   Pulse (!) 57   Ht 5\' 4"  (1.626 m)   Wt 163 lb (73.9 kg)   SpO2 96%   BMI 27.98 kg/m   General Appearance: No distress  Neuro:without focal findings,  speech normal,  HEENT: PERRLA, EOM intact.   Pulmonary: Scattered bibasilar crackles heard posteriorly. CardiovascularNormal S1,S2.  No m/r/g.   Abdomen: Benign, Soft, non-tender. Renal:  No costovertebral tenderness  GU:  No performed at this time. Endoc: No evident thyromegaly, no signs of acromegaly. Skin:   warm, no rashes, no ecchymosis  Extremities: normal, no cyanosis, clubbing.  Other findings:    LABORATORY PANEL:   CBC No results for input(s): WBC, HGB, HCT, PLT in the last 168  hours. ------------------------------------------------------------------------------------------------------------------  Chemistries  No results for input(s): NA, K, CL, CO2, GLUCOSE, BUN, CREATININE, CALCIUM, MG, AST, ALT, ALKPHOS, BILITOT in the last 168 hours.  Invalid input(s): GFRCGP ------------------------------------------------------------------------------------------------------------------  Cardiac Enzymes No results for input(s): TROPONINI in the last 168 hours. ------------------------------------------------------------  RADIOLOGY:  No results found.     Thank  you for the consultation and for allowing [facility] to assist in the care of your patient. Our recommendations are noted above.  Please contact us if we can be of further service.   Marda Stalker, MD.  Board Certified in Internal Medicine, Pulmonary Medicine, [place], and Sleep Medicine.  [facility] Office Number: [phone]  Patricia Pesa, M.D.  Merton Border, M.D  06/13/2017

## 2017-06-14 ENCOUNTER — Ambulatory Visit: Payer: Medicare Other

## 2017-06-15 ENCOUNTER — Ambulatory Visit (INDEPENDENT_AMBULATORY_CARE_PROVIDER_SITE_OTHER): Payer: Medicare Other | Admitting: Internal Medicine

## 2017-06-15 ENCOUNTER — Encounter: Payer: Self-pay | Admitting: Internal Medicine

## 2017-06-15 VITALS — BP 118/78 | HR 57 | Ht 64.0 in | Wt 163.0 lb

## 2017-06-15 DIAGNOSIS — J42 Unspecified chronic bronchitis: Secondary | ICD-10-CM

## 2017-06-15 DIAGNOSIS — I6529 Occlusion and stenosis of unspecified carotid artery: Secondary | ICD-10-CM | POA: Diagnosis not present

## 2017-06-15 NOTE — Patient Instructions (Signed)
Try generic prilosec (omeprazole) 20 mg once daily. If this causes side effects then continue with zantac.    --10 REFLUX RULES TO LIVE BY---  1. Limit liquids to no more than 6oz/hour. (A liquid includes smoothies, yogurt, soup, ice cream, etc).   2. STOP ALL LIQUIDS 3 hours before bed.   3. Stop all solid food 4 hours before bedtime.   4. Move bedtimes meds to evening meal (except for sleeping medications).   5. High fat foods slow gastric movement, if you are going to eat something with high fat, eat it for lunch, and not dinner.   6. Sleep elevated, preferably on an adjustable bed with upper body elevated between 30-45 degree and the knees slightly elevated and flexed. (Sleep on a few pillows is not nearly enough!).   7. NEVER sleep on your right side or stomach-- switch sides of the bed if necessary so you wont sleep on your right side.   8. Avoid these products as tey ALL will make silent reflux worse for several hours: Alcohol, caffeine, carobated beverages, chocolate, fatty foods, tomato sauces.   9. Remember that there is NO medication that stops reflux! Medications only lower the acid content in the stomach juices.   10. Only CARBOHYDRATES, GRAINS, AND STARCHES absorb the stomach liquids so they will not easily reflux high enough to be aspirated.

## 2017-07-05 DIAGNOSIS — H35373 Puckering of macula, bilateral: Secondary | ICD-10-CM | POA: Diagnosis not present

## 2017-07-11 ENCOUNTER — Ambulatory Visit (INDEPENDENT_AMBULATORY_CARE_PROVIDER_SITE_OTHER): Payer: Medicare Other

## 2017-07-11 DIAGNOSIS — Z23 Encounter for immunization: Secondary | ICD-10-CM | POA: Diagnosis not present

## 2017-08-01 ENCOUNTER — Telehealth: Payer: Self-pay | Admitting: Family Medicine

## 2017-08-01 DIAGNOSIS — G8929 Other chronic pain: Secondary | ICD-10-CM

## 2017-08-01 DIAGNOSIS — M25569 Pain in unspecified knee: Principal | ICD-10-CM

## 2017-08-01 NOTE — Telephone Encounter (Signed)
Patient states she would like to see ortho, she had a partial knee replacement in 2009 and had a fall 2 weeks ago and would like it checked out. She had the surgery done in [place]. Patient states she had a pneumonia shot pneumovax 23 last august 2017 and is wondering if she needs another pneumonia vaccine?

## 2017-08-01 NOTE — Telephone Encounter (Signed)
Pt called about needing a referral to the Orthopedic doctor [place]/ [place] area. Pt has a question about the pneumonia shot. Thank you!

## 2017-08-01 NOTE — Telephone Encounter (Signed)
Orthopedic referral placed. I would be happy to see her in the office to evaluate her knee first if she would like though she can see the orthopedic doctor as well. Please see how many pneumonia shots she has had and if she had the Prevnar 13 pneumonia shot. If so she does not need another pneumonia shot. If she did not she would likely benefit from that. Thanks.

## 2017-08-02 ENCOUNTER — Ambulatory Visit: Payer: Medicare Other

## 2017-08-02 NOTE — Telephone Encounter (Signed)
Defer to [facility] regarding how quickly she gets in to see orthopedics. Please let her know who she is being referred to once  you find out. Thanks.

## 2017-08-02 NOTE — Telephone Encounter (Signed)
Patient is scheduled for prevnar 13, patient would like to know who she will be referred to and how soon she can be seen by orthopedic. Patient states she would like to just go to see the orthopedic instead of coming into the office for an OV

## 2017-08-03 ENCOUNTER — Ambulatory Visit (INDEPENDENT_AMBULATORY_CARE_PROVIDER_SITE_OTHER): Payer: Medicare Other | Admitting: *Deleted

## 2017-08-03 DIAGNOSIS — Z23 Encounter for immunization: Secondary | ICD-10-CM | POA: Diagnosis not present

## 2017-08-03 NOTE — Progress Notes (Signed)
Patient presented for pneumonia Vaccine , tolerated well,

## 2017-08-04 NOTE — Telephone Encounter (Signed)
Faxed to [facility] for pt to be scheduled asap in [place] office. They are going to call her to schedule.

## 2017-08-07 DIAGNOSIS — M25562 Pain in left knee: Secondary | ICD-10-CM | POA: Diagnosis not present

## 2017-08-16 DIAGNOSIS — Z9181 History of falling: Secondary | ICD-10-CM | POA: Diagnosis not present

## 2017-08-16 DIAGNOSIS — M25569 Pain in unspecified knee: Secondary | ICD-10-CM | POA: Diagnosis not present

## 2017-08-16 DIAGNOSIS — R29898 Other symptoms and signs involving the musculoskeletal system: Secondary | ICD-10-CM | POA: Diagnosis not present

## 2017-08-16 DIAGNOSIS — R2681 Unsteadiness on feet: Secondary | ICD-10-CM | POA: Diagnosis not present

## 2017-09-07 NOTE — Progress Notes (Signed)
[facility] [address], [place] [address] Phone: [phone] Subjective:    I'm seeing this patient by the request  of:    CC:   [id number]  Isabel Hernandez is a 76 y.o. female coming in with complaint of bilateral groin pain. Right side is worse. Has an MRI. Last back injection helped.  Onset- Chronic Location- Groin Duration- Constant Character- Sharp Aggravating factors- Walking  Reliving factors-  Therapies tried-  Severity-6 out of 10 and worsening     Past Medical History:  Diagnosis Date  . A-fib ([place])   . Arthritis   . Breast cancer ([place]) 2002   RT LUMPECTOMY  . Bronchitis   . Carotid artery narrowings   . 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 Carotid artery occlusion    RIGHT 50% BLOCKAGE  . Colon polyps   . Complication of anesthesia    shaking/ FOLLOWING LOCAL AT DENTIST , drop O2 sats TO 50% DURING COLONOSCOPY  . Coronary artery disease   . Diverticulosis   . Dysrhythmia   . Environmental allergies   . GERD (gastroesophageal reflux disease)   . History of hiatal hernia   . Hyperlipidemia   . Hypertension   . IBS (irritable bowel syndrome)   . Mitral valve disorder   . Shortness of breath dyspnea   . Syncope and collapse   . UTI (lower urinary tract infection)    Past Surgical History:  Procedure Laterality Date  . APPENDECTOMY    . BREAST BIOPSY Right 2002   POS  . BREAST CYST ASPIRATION Right   . BREAST EXCISIONAL BIOPSY Left 1997   NEG  . BREAST LUMPECTOMY    . CATARACT EXTRACTION W/PHACO Right 06/07/2016   Procedure: CATARACT EXTRACTION PHACO AND INTRAOCULAR LENS PLACEMENT (IOC);  Surgeon: Birder Robson, MD;  Location: [facility] ORS;  Service: Ophthalmology;  Laterality: Right;  [place] 00:51 AP% 19.6 CDE 10.09 Fluid pack lot # [id number]  . CATARACT EXTRACTION W/PHACO Left 07/05/2016   Procedure: CATARACT EXTRACTION PHACO AND INTRAOCULAR LENS PLACEMENT (IOC);  Surgeon: Birder Robson, MD;   Location: [facility] ORS;  Service: Ophthalmology;  Laterality: Left;  [place] 00:38 AP% 24.9 CDE 9.60 Fluid Pack lot # [id number]  . CESAREAN SECTION    . TONSILLECTOMY    . TOTAL KNEE ARTHROPLASTY Left    Social History   Socioeconomic History  . Marital status: Married    Spouse name: None  . Number of children: None  . Years of education: None  . Highest education level: None  Social Needs  . Financial resource strain: None  . Food insecurity - worry: None  . Food insecurity - inability: None  . Transportation needs - medical: None  . Transportation needs - non-medical: None  Occupational History  . Occupation: retired    Comment: real estate  Tobacco Use  . Smoking status: Former Smoker    Years: 10.00    Types: Cigarettes    Last attempt to quit: 10/03/1969    Years since quitting: 47.9  . Smokeless tobacco: Never Used  Substance and Sexual Activity  . Alcohol use: Yes    Alcohol/week: 0.0 oz    Comment: couple drinks/week  . Drug use: No  . Sexual activity: None  Other Topics Concern  . None  Social History Narrative  . None   Allergies  Allergen Reactions  . Nitroglycerin Other (See Comments)    Can only use the patch  Turned lobster  red and felt faint with sublingual NTG  . Penicillins Hives    Hives Has patient had a PCN reaction causing immediate rash, facial/tongue/throat swelling, SOB or lightheadedness with hypotension: YES Has patient had a PCN reaction causing severe rash involving mucus membranes or skin necrosis: NO Has patient had a PCN reaction that required hospitalization NO Has patient had a PCN reaction occurring within the last 10 years: No If all of the above answers are "NO", then may proceed with Cephalosporin use.  . Biaxin [Clarithromycin] Rash    Rash   . Latex Rash  . Lidocaine Swelling    Eye swelling Tolerates Tetracaine with epidural steroid injections (04/10/17)  . Nickel Rash  . Oxycodone Hives  . Tetracyclines & Related Rash         . Tizanidine Rash   Family History  Problem Relation Age of Onset  . Lung cancer Father   . Prostate cancer Father   . Hyperlipidemia Brother   . Hypertension Brother   . Obesity Brother   . Heart disease Mother   . Lung cancer Sister   . Kidney disease Unknown        Maternal grandparent  . Breast cancer Neg Hx      Past medical history, social, surgical and family history all reviewed in electronic medical record.  No pertanent information unless stated regarding to the chief complaint.   Review of Systems:Review of systems updated and as accurate as of 09/08/17  No headache, visual changes, nausea, vomiting, diarrhea, constipation, dizziness, abdominal pain, skin rash, fevers, chills, night sweats, weight loss, swollen lymph nodes, body aches, joint swelling, muscle aches, chest pain, shortness of breath, mood changes.   Objective  Blood pressure (!) 146/90, pulse 72, height 5\' 4"  (1.626 m), weight 165 lb (74.8 kg), SpO2 95 %. Systems examined below as of 09/08/17   General: No apparent distress alert and oriented x3 mood and affect normal, dressed appropriately.  HEENT: Pupils equal, extraocular movements intact  Respiratory: Patient's speak in full sentences and does not appear short of breath  Cardiovascular: No lower extremity edema, non tender, no erythema  Skin: Warm dry intact with no signs of infection or rash on extremities or on axial skeleton.  Abdomen: Soft nontender  Neuro: Cranial nerves II through XII are intact, neurovascularly intact in all extremities with 2+ DTRs and 2+ pulses.  Lymph: No lymphadenopathy of posterior or anterior cervical chain or axillae bilaterally.  Gait normal with good balance and coordination.  MSK:  Non tender with full range of motion and good stability and symmetric strength and tone of shoulders, elbows, wrist, hip, knee and ankles bilaterally.  Arthritic changes of multiple joints.  Patient does have a partial left knee  replacement Exam Full range of motion.  Mild positive Faber bilaterally.  Very minimal discomfort over the insertion of the hip flexor tendon seems to be about the same.   [id number]; 15 additional minutes spent for Therapeutic exercises as stated in above notes.  This included exercises focusing on stretching, strengthening, with significant focus on eccentric aspects.   Long term goals include an improvement in range of motion, strength, endurance as well as avoiding reinjury. Patient's frequency would include in 1-2 times a day, 3-5 times a week for a duration of 6-12 weeks. Hip strengthening exercises which included:  Pelvic tilt/bracing to help with proper recruitment of the lower abs and pelvic floor muscles  Glute strengthening to properly contract glutes without over-engaging low back and hamstrings -  prone hip extension and glute bridge exercises Proper stretching techniques to increase effectiveness for the hip flexors, groin, quads, piriformic and low back when appropriate   Proper technique shown and discussed handout in great detail with ATC.  All questions were discussed and answered.     Impression and Recommendations:     This case required medical decision making of moderate complexity.      Note: This dictation was prepared with Dragon dictation along with smaller phrase technology. Any transcriptional errors that result from this process are unintentional.

## 2017-09-08 ENCOUNTER — Encounter: Payer: Self-pay | Admitting: Family Medicine

## 2017-09-08 ENCOUNTER — Ambulatory Visit (INDEPENDENT_AMBULATORY_CARE_PROVIDER_SITE_OTHER): Payer: Medicare Other | Admitting: Family Medicine

## 2017-09-08 DIAGNOSIS — I6529 Occlusion and stenosis of unspecified carotid artery: Secondary | ICD-10-CM | POA: Diagnosis not present

## 2017-09-08 DIAGNOSIS — M76899 Other specified enthesopathies of unspecified lower limb, excluding foot: Secondary | ICD-10-CM | POA: Diagnosis not present

## 2017-09-08 MED ORDER — VITAMIN D (ERGOCALCIFEROL) 1.25 MG (50000 UNIT) PO CAPS: 50000.0000 [IU] | ORAL_CAPSULE | ORAL | 0 refills | Status: DC

## 2017-09-08 NOTE — Patient Instructions (Signed)
Good to see yo u I think this is more snapping hip or possible hip flexor tendonitis.  Ice 20 minutes 2 times daily. Usually after activity and before bed. pennsaid pinkie amount topically 2 times daily as needed.  Once weekly vitamin D Exercises 3 times a week.  Lets not make the back angry  Happy holidays!  See me again in 6 weeks

## 2017-09-08 NOTE — Assessment & Plan Note (Signed)
Patient does have more of a hip flexor tendinitis.  We discussed with patient in great length.  We discussed home exercise and patient work with Product/process development scientist.  With patient's back doing significantly better though we do not want to make any significant drastic changes.  Patient declined any formal physical therapy.  We discussed icing regimen, which activities to do which wants to avoid.  Put on once weekly vitamin D for muscle strength and endurance.  Follow-up again with me 4 weeks

## 2017-09-21 DIAGNOSIS — M25562 Pain in left knee: Secondary | ICD-10-CM | POA: Diagnosis not present

## 2017-10-02 ENCOUNTER — Other Ambulatory Visit: Payer: Self-pay | Admitting: Cardiovascular Disease

## 2017-10-11 ENCOUNTER — Telehealth: Payer: Self-pay | Admitting: *Deleted

## 2017-10-11 DIAGNOSIS — M5416 Radiculopathy, lumbar region: Secondary | ICD-10-CM

## 2017-10-11 NOTE — Telephone Encounter (Signed)
Copied from [place] [id number]. Topic: Appointment Scheduling - Scheduling Inquiry for Clinic >> Oct 11, 2017 12:23 PM Ether Griffins B wrote: Reason for CRM: pt was told to call when she needed another injection in her back.  >> Oct 11, 2017  2:05 PM Para Skeans A wrote: Spoke with patient. She is needing a injection at the imaging center.

## 2017-10-11 NOTE — Telephone Encounter (Signed)
Injections ordered & sent to [facility].  Pt made aware.

## 2017-10-24 ENCOUNTER — Ambulatory Visit: Payer: Medicare Other | Admitting: Family Medicine

## 2017-10-25 DIAGNOSIS — L2389 Allergic contact dermatitis due to other agents: Secondary | ICD-10-CM | POA: Diagnosis not present

## 2017-10-26 ENCOUNTER — Ambulatory Visit
Admission: RE | Admit: 2017-10-26 | Discharge: 2017-10-26 | Disposition: A | Discharge status: Home / Self Care | Payer: Medicare Other | Source: Ambulatory Visit | Attending: Family Medicine | Admitting: Family Medicine

## 2017-10-26 DIAGNOSIS — M5416 Radiculopathy, lumbar region: Secondary | ICD-10-CM

## 2017-10-26 DIAGNOSIS — M47817 Spondylosis without myelopathy or radiculopathy, lumbosacral region: Secondary | ICD-10-CM | POA: Diagnosis not present

## 2017-10-26 MED ORDER — METHYLPREDNISOLONE ACETATE 40 MG/ML INJ SUSP (RADIOLOG
120.0000 mg | Freq: Once | INTRAMUSCULAR | Status: AC
  Administered 2017-10-26: 120 mg via INTRA_ARTICULAR

## 2017-10-26 MED ORDER — IOPAMIDOL (ISOVUE-M 200) INJECTION 41%
1.0000 mL | Freq: Once | INTRAMUSCULAR | Status: AC
  Administered 2017-10-26: 1 mL via INTRA_ARTICULAR

## 2017-11-08 DIAGNOSIS — E782 Mixed hyperlipidemia: Secondary | ICD-10-CM | POA: Diagnosis not present

## 2017-11-08 DIAGNOSIS — I10 Essential (primary) hypertension: Secondary | ICD-10-CM | POA: Diagnosis not present

## 2017-11-08 DIAGNOSIS — K219 Gastro-esophageal reflux disease without esophagitis: Secondary | ICD-10-CM | POA: Diagnosis not present

## 2017-11-08 DIAGNOSIS — I6523 Occlusion and stenosis of bilateral carotid arteries: Secondary | ICD-10-CM | POA: Diagnosis not present

## 2017-12-07 ENCOUNTER — Other Ambulatory Visit: Payer: Self-pay | Admitting: Cardiovascular Disease

## 2018-01-09 ENCOUNTER — Ambulatory Visit: Payer: Medicare Other | Admitting: Neurology

## 2018-01-31 ENCOUNTER — Telehealth: Payer: Self-pay | Admitting: Family Medicine

## 2018-01-31 DIAGNOSIS — M47816 Spondylosis without myelopathy or radiculopathy, lumbar region: Secondary | ICD-10-CM

## 2018-01-31 NOTE — Telephone Encounter (Signed)
Injections ordered & sent to [facility].

## 2018-01-31 NOTE — Telephone Encounter (Signed)
Copied from [place] [id number]. Topic: Inquiry >> Jan 31, 2018 11:03 AM Isabel Hernandez wrote: Reason for CRM: Pt called requesting that Dr. Tamala Julian order injections for her again at [facility] imaging in her lower back. Please advise.

## 2018-02-12 ENCOUNTER — Ambulatory Visit
Admission: RE | Admit: 2018-02-12 | Discharge: 2018-02-12 | Disposition: A | Discharge status: Home / Self Care | Payer: Medicare Other | Source: Ambulatory Visit | Attending: Family Medicine | Admitting: Family Medicine

## 2018-02-12 DIAGNOSIS — M4696 Unspecified inflammatory spondylopathy, lumbar region: Secondary | ICD-10-CM | POA: Diagnosis not present

## 2018-02-12 DIAGNOSIS — M47816 Spondylosis without myelopathy or radiculopathy, lumbar region: Secondary | ICD-10-CM

## 2018-02-12 MED ORDER — IOPAMIDOL (ISOVUE-M 200) INJECTION 41%
1.0000 mL | Freq: Once | INTRAMUSCULAR | Status: AC
  Administered 2018-02-12: 1 mL via INTRA_ARTICULAR

## 2018-02-12 MED ORDER — METHYLPREDNISOLONE ACETATE 40 MG/ML INJ SUSP (RADIOLOG
120.0000 mg | Freq: Once | INTRAMUSCULAR | Status: AC
  Administered 2018-02-12: 120 mg via INTRA_ARTICULAR

## 2018-02-27 DIAGNOSIS — H66002 Acute suppurative otitis media without spontaneous rupture of ear drum, left ear: Secondary | ICD-10-CM | POA: Diagnosis not present

## 2018-02-27 DIAGNOSIS — H60502 Unspecified acute noninfective otitis externa, left ear: Secondary | ICD-10-CM | POA: Diagnosis not present

## 2018-02-27 DIAGNOSIS — H6122 Impacted cerumen, left ear: Secondary | ICD-10-CM | POA: Diagnosis not present

## 2018-03-06 DIAGNOSIS — Z87891 Personal history of nicotine dependence: Secondary | ICD-10-CM | POA: Diagnosis not present

## 2018-03-06 DIAGNOSIS — R221 Localized swelling, mass and lump, neck: Secondary | ICD-10-CM | POA: Diagnosis not present

## 2018-03-06 DIAGNOSIS — I10 Essential (primary) hypertension: Secondary | ICD-10-CM | POA: Diagnosis not present

## 2018-03-07 ENCOUNTER — Other Ambulatory Visit: Payer: Self-pay | Admitting: Internal Medicine

## 2018-03-07 DIAGNOSIS — Z87891 Personal history of nicotine dependence: Secondary | ICD-10-CM

## 2018-03-07 DIAGNOSIS — R221 Localized swelling, mass and lump, neck: Secondary | ICD-10-CM

## 2018-03-09 ENCOUNTER — Ambulatory Visit
Admission: RE | Admit: 2018-03-09 | Discharge: 2018-03-09 | Disposition: A | Discharge status: Home / Self Care | Payer: Medicare Other | Source: Ambulatory Visit | Attending: Internal Medicine | Admitting: Internal Medicine

## 2018-03-09 DIAGNOSIS — Z87891 Personal history of nicotine dependence: Secondary | ICD-10-CM | POA: Insufficient documentation

## 2018-03-09 DIAGNOSIS — R221 Localized swelling, mass and lump, neck: Secondary | ICD-10-CM | POA: Diagnosis not present

## 2018-03-22 ENCOUNTER — Other Ambulatory Visit: Payer: Self-pay | Admitting: Cardiovascular Disease

## 2018-03-23 ENCOUNTER — Other Ambulatory Visit: Payer: Self-pay | Admitting: Cardiovascular Disease

## 2018-04-16 ENCOUNTER — Inpatient Hospital Stay
Admission: EM | Admit: 2018-04-16 | Discharge: 2018-04-19 | DRG: 065 | Disposition: A | Discharge status: Rehabilitation Facility | Payer: Medicare Other | Source: Skilled Nursing Facility | Attending: Internal Medicine | Admitting: Internal Medicine

## 2018-04-16 ENCOUNTER — Other Ambulatory Visit: Payer: Self-pay

## 2018-04-16 ENCOUNTER — Emergency Department: Payer: Medicare Other

## 2018-04-16 DIAGNOSIS — I6389 Other cerebral infarction: Secondary | ICD-10-CM | POA: Diagnosis not present

## 2018-04-16 DIAGNOSIS — I639 Cerebral infarction, unspecified: Secondary | ICD-10-CM | POA: Diagnosis present

## 2018-04-16 DIAGNOSIS — Z8349 Family history of other endocrine, nutritional and metabolic diseases: Secondary | ICD-10-CM

## 2018-04-16 DIAGNOSIS — Z801 Family history of malignant neoplasm of trachea, bronchus and lung: Secondary | ICD-10-CM

## 2018-04-16 DIAGNOSIS — I4891 Unspecified atrial fibrillation: Secondary | ICD-10-CM | POA: Diagnosis present

## 2018-04-16 DIAGNOSIS — I6523 Occlusion and stenosis of bilateral carotid arteries: Secondary | ICD-10-CM | POA: Diagnosis present

## 2018-04-16 DIAGNOSIS — E785 Hyperlipidemia, unspecified: Secondary | ICD-10-CM | POA: Diagnosis present

## 2018-04-16 DIAGNOSIS — Z9842 Cataract extraction status, left eye: Secondary | ICD-10-CM

## 2018-04-16 DIAGNOSIS — K5790 Diverticulosis of intestine, part unspecified, without perforation or abscess without bleeding: Secondary | ICD-10-CM | POA: Diagnosis present

## 2018-04-16 DIAGNOSIS — Z885 Allergy status to narcotic agent status: Secondary | ICD-10-CM

## 2018-04-16 DIAGNOSIS — K219 Gastro-esophageal reflux disease without esophagitis: Secondary | ICD-10-CM | POA: Diagnosis not present

## 2018-04-16 DIAGNOSIS — Z888 Allergy status to other drugs, medicaments and biological substances status: Secondary | ICD-10-CM

## 2018-04-16 DIAGNOSIS — N3000 Acute cystitis without hematuria: Secondary | ICD-10-CM | POA: Diagnosis not present

## 2018-04-16 DIAGNOSIS — I10 Essential (primary) hypertension: Secondary | ICD-10-CM | POA: Diagnosis not present

## 2018-04-16 DIAGNOSIS — R29706 NIHSS score 6: Secondary | ICD-10-CM | POA: Diagnosis not present

## 2018-04-16 DIAGNOSIS — I059 Rheumatic mitral valve disease, unspecified: Secondary | ICD-10-CM | POA: Diagnosis present

## 2018-04-16 DIAGNOSIS — R29818 Other symptoms and signs involving the nervous system: Secondary | ICD-10-CM | POA: Diagnosis not present

## 2018-04-16 DIAGNOSIS — N390 Urinary tract infection, site not specified: Secondary | ICD-10-CM | POA: Diagnosis present

## 2018-04-16 DIAGNOSIS — Z9841 Cataract extraction status, right eye: Secondary | ICD-10-CM

## 2018-04-16 DIAGNOSIS — Z91040 Latex allergy status: Secondary | ICD-10-CM

## 2018-04-16 DIAGNOSIS — Z96652 Presence of left artificial knee joint: Secondary | ICD-10-CM | POA: Diagnosis present

## 2018-04-16 DIAGNOSIS — Z961 Presence of intraocular lens: Secondary | ICD-10-CM | POA: Diagnosis present

## 2018-04-16 DIAGNOSIS — R29898 Other symptoms and signs involving the musculoskeletal system: Secondary | ICD-10-CM

## 2018-04-16 DIAGNOSIS — Z86010 Personal history of colonic polyps: Secondary | ICD-10-CM

## 2018-04-16 DIAGNOSIS — Z91048 Other nonmedicinal substance allergy status: Secondary | ICD-10-CM

## 2018-04-16 DIAGNOSIS — Z8042 Family history of malignant neoplasm of prostate: Secondary | ICD-10-CM

## 2018-04-16 DIAGNOSIS — Z8249 Family history of ischemic heart disease and other diseases of the circulatory system: Secondary | ICD-10-CM

## 2018-04-16 DIAGNOSIS — M1990 Unspecified osteoarthritis, unspecified site: Secondary | ICD-10-CM | POA: Diagnosis present

## 2018-04-16 DIAGNOSIS — R251 Tremor, unspecified: Secondary | ICD-10-CM | POA: Diagnosis present

## 2018-04-16 DIAGNOSIS — Z853 Personal history of malignant neoplasm of breast: Secondary | ICD-10-CM

## 2018-04-16 DIAGNOSIS — Z881 Allergy status to other antibiotic agents status: Secondary | ICD-10-CM

## 2018-04-16 DIAGNOSIS — Z79899 Other long term (current) drug therapy: Secondary | ICD-10-CM

## 2018-04-16 DIAGNOSIS — K589 Irritable bowel syndrome without diarrhea: Secondary | ICD-10-CM | POA: Diagnosis present

## 2018-04-16 DIAGNOSIS — Z880 Allergy status to penicillin: Secondary | ICD-10-CM

## 2018-04-16 DIAGNOSIS — Z87891 Personal history of nicotine dependence: Secondary | ICD-10-CM

## 2018-04-16 DIAGNOSIS — M6281 Muscle weakness (generalized): Secondary | ICD-10-CM | POA: Diagnosis not present

## 2018-04-16 DIAGNOSIS — I2510 Atherosclerotic heart disease of native coronary artery without angina pectoris: Secondary | ICD-10-CM | POA: Diagnosis present

## 2018-04-16 LAB — COMPREHENSIVE METABOLIC PANEL
ALK PHOS: 46 U/L (ref 38–126)
ALT: 21 U/L (ref 0–44)
ANION GAP: 11 (ref 5–15)
AST: 41 U/L (ref 15–41)
Albumin: 4.3 g/dL (ref 3.5–5.0)
BUN: 18 mg/dL (ref 8–23)
CALCIUM: 9.3 mg/dL (ref 8.9–10.3)
CO2: 22 mmol/L (ref 22–32)
Chloride: 105 mmol/L (ref 98–111)
Creatinine, Ser: 1 mg/dL (ref 0.44–1.00)
GFR, EST NON AFRICAN AMERICAN: 53 mL/min — AB (ref >60)
Glucose, Bld: 169 mg/dL — ABNORMAL HIGH (ref 70–99)
Potassium: 3.5 mmol/L (ref 3.5–5.1)
SODIUM: 138 mmol/L (ref 135–145)
TOTAL PROTEIN: 6.5 g/dL (ref 6.5–8.1)
Total Bilirubin: 1.2 mg/dL (ref 0.3–1.2)

## 2018-04-16 LAB — URINALYSIS, COMPLETE (UACMP) WITH MICROSCOPIC
BILIRUBIN URINE: NEGATIVE
GLUCOSE, UA: NEGATIVE mg/dL
Hgb urine dipstick: NEGATIVE
KETONES UR: NEGATIVE mg/dL
Nitrite: NEGATIVE
PH: 5 (ref 5.0–8.0)
Protein, ur: NEGATIVE mg/dL
SPECIFIC GRAVITY, URINE: 1.01 (ref 1.005–1.030)
Squamous Epithelial / LPF: NONE SEEN (ref 0–5)

## 2018-04-16 LAB — CBC WITH DIFFERENTIAL/PLATELET
BASOS ABS: 0 10*3/uL (ref 0–0.1)
Basophils Relative: 1 %
EOS ABS: 0.2 10*3/uL (ref 0–0.7)
EOS PCT: 3 %
HCT: 35.5 % (ref 35.0–47.0)
HEMOGLOBIN: 12.6 g/dL (ref 12.0–16.0)
Lymphocytes Relative: 39 %
Lymphs Abs: 2.3 10*3/uL (ref 1.0–3.6)
MCH: 35.3 pg — ABNORMAL HIGH (ref 26.0–34.0)
MCHC: 35.4 g/dL (ref 32.0–36.0)
MCV: 99.8 fL (ref 80.0–100.0)
Monocytes Absolute: 0.9 10*3/uL (ref 0.2–0.9)
Monocytes Relative: 16 %
NEUTROS PCT: 41 %
Neutro Abs: 2.4 10*3/uL (ref 1.4–6.5)
PLATELETS: 127 10*3/uL — AB (ref 150–440)
RBC: 3.56 MIL/uL — AB (ref 3.80–5.20)
RDW: 13.8 % (ref 11.5–14.5)
WBC: 5.9 10*3/uL (ref 3.6–11.0)

## 2018-04-16 LAB — TROPONIN I: Troponin I: <0.03 ng/mL (ref <0.03)

## 2018-04-16 MED ORDER — LORAZEPAM 2 MG/ML IJ SOLN
INTRAMUSCULAR | Status: AC
  Filled 2018-04-16: qty 1

## 2018-04-16 MED ORDER — CEFTRIAXONE SODIUM 1 G IJ SOLR
1.0000 g | Freq: Once | INTRAMUSCULAR | Status: AC
  Administered 2018-04-16: 1 g via INTRAVENOUS
  Filled 2018-04-16: qty 10

## 2018-04-16 MED ORDER — ORPHENADRINE CITRATE 30 MG/ML IJ SOLN
30.0000 mg | Freq: Once | INTRAMUSCULAR | Status: AC
  Administered 2018-04-16: 30 mg via INTRAVENOUS
  Filled 2018-04-16: qty 2

## 2018-04-16 MED ORDER — LORAZEPAM 2 MG/ML IJ SOLN
1.0000 mg | Freq: Once | INTRAMUSCULAR | Status: AC
  Administered 2018-04-16: 1 mg via INTRAVENOUS

## 2018-04-16 MED ORDER — FENTANYL CITRATE (PF) 100 MCG/2ML IJ SOLN
100.0000 ug | Freq: Once | INTRAMUSCULAR | Status: AC
  Administered 2018-04-16: 100 ug via INTRAVENOUS
  Filled 2018-04-16: qty 2

## 2018-04-16 MED ORDER — DIPHENHYDRAMINE HCL 50 MG/ML IJ SOLN
12.5000 mg | Freq: Once | INTRAMUSCULAR | Status: AC
  Administered 2018-04-16: 12.5 mg via INTRAVENOUS
  Filled 2018-04-16: qty 1

## 2018-04-16 NOTE — ED Provider Notes (Signed)
[facility] Emergency Department Provider Note  ____________________________________________   None    (approximate)  I have reviewed the triage vital signs and the nursing notes.   HISTORY  Chief Complaint Tremors   HPI Isabel Hernandez is a 77 y.o. female who presents to the emergency department for treatment and evaluation of tremors which started approximately 30 minutes prior to arrival.  She arrived to the emergency department via EMS from [place].  She states that she has tingling in her hands and feet and feels heaviness in her right leg.  No slurred speech, confusion, recent trauma, or recent illness.  No changes in medication with the exception of stopping her losartan without medical advice due to adverse effects that she read online.  Tremors are all 4 extremities but worse in the right upper and lower extremity.  Patient denies previous similar symptoms.  Patient denies chest pain or shortness of breath.  Patient denies abdominal pain, nausea, vomiting, or diarrhea or recent illness or change in medication with the exception of stopping her losartan.  Past Medical History:  Diagnosis Date  . A-fib ([place])   . Arthritis   . Breast cancer ([place]) 2002   RT LUMPECTOMY  . Bronchitis   . Carotid artery narrowings   . 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 Carotid artery occlusion    RIGHT 50% BLOCKAGE  . Colon polyps   . Complication of anesthesia    shaking/ FOLLOWING LOCAL AT DENTIST , drop O2 sats TO 50% DURING COLONOSCOPY  . Coronary artery disease   . Diverticulosis   . Dysrhythmia   . Environmental allergies   . GERD (gastroesophageal reflux disease)   . History of hiatal hernia   . Hyperlipidemia   . Hypertension   . IBS (irritable bowel syndrome)   . Mitral valve disorder   . Shortness of breath dyspnea   . Syncope and collapse   . UTI (lower urinary tract infection)     Patient Active Problem List   Diagnosis Date Noted  .  Hip flexor tendinitis 09/08/2017  . 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 Hand or foot spasms 05/09/2017  . Tremor 05/09/2017  . Sinusitis 04/28/2017  . Lumbar spinal stenosis 03/01/2017  . Lumbar radiculopathy, right 01/30/2017  . Right hip pain 08/19/2016  . Pneumonitis 05/19/2016  . GERD (gastroesophageal reflux disease) 05/19/2016  . NSAID long-term use 03/22/2016  . Primary osteoarthritis of left knee 03/22/2016  . Primary osteoarthritis of right hand 03/22/2016  . Rheumatoid factor positive 03/22/2016  . Essential hypertension 02/17/2016  . Carotid stenosis 02/17/2016  . Abnormal chest x-ray 02/17/2016  . Hyperlipidemia 02/17/2016  . Allergic rhinitis 02/09/2016    Past Surgical History:  Procedure Laterality Date  . APPENDECTOMY    . BREAST BIOPSY Right 2002   POS  . BREAST CYST ASPIRATION Right   . BREAST EXCISIONAL BIOPSY Left 1997   NEG  . BREAST LUMPECTOMY    . CATARACT EXTRACTION W/PHACO Right 06/07/2016   Procedure: CATARACT EXTRACTION PHACO AND INTRAOCULAR LENS PLACEMENT (IOC);  Surgeon: Birder Robson, MD;  Location: [facility] ORS;  Service: Ophthalmology;  Laterality: Right;  [place] 00:51 AP% 19.6 CDE 10.09 Fluid pack lot # [id number]  . CATARACT EXTRACTION W/PHACO Left 07/05/2016   Procedure: CATARACT EXTRACTION PHACO AND INTRAOCULAR LENS PLACEMENT (IOC);  Surgeon: Birder Robson, MD;  Location: [facility] ORS;  Service: Ophthalmology;  Laterality: Left;  [place] 00:38 AP% 24.9 CDE 9.60 Fluid Pack lot #  [id number] [place] TONSILLECTOMY    . TOTAL KNEE ARTHROPLASTY Left     Prior to Admission medications   Medication Sig Start Date End Date Taking? Authorizing Provider  amLODipine (NORVASC) 5 MG tablet TAKE ONE TABLET EVERY DAY 12/07/17   Wellington Hampshire, MD  atorvastatin (LIPITOR) 20 MG tablet TAKE ONE TABLET BY MOUTH EVERY DAY 10/02/17   Wellington Hampshire, MD  fenofibrate (TRICOR) 145 MG tablet TAKE ONE TABLET BY MOUTH EVERY DAY 10/02/17   Wellington Hampshire, MD  fluticasone (FLONASE) 50 MCG/ACT nasal spray Place 2 sprays into both nostrils daily. 05/09/17   Leone Haven, MD  losartan (COZAAR) 50 MG tablet TAKE 1 TABLET BY MOUTH DAILY 03/22/18   Wellington Hampshire, MD  metoprolol tartrate (LOPRESSOR) 25 MG tablet TAKE 1 TABLET BY MOUTH TWICE DAILY 03/23/18   Wellington Hampshire, MD  Multiple Vitamin (MULTIVITAMIN WITH MINERALS) TABS tablet Take 1 tablet by mouth daily.    [provider]  Vitamin D, Ergocalciferol, (DRISDOL) 50000 units CAPS capsule Take 1 capsule (50,000 Units total) by mouth every 7 (seven) days. 09/08/17   Lyndal Pulley, DO    Allergies Nitroglycerin; Penicillins; Biaxin [clarithromycin]; Latex; Lidocaine; Nickel; Oxycodone; Tetracyclines & related; and Tizanidine  Family History  Problem Relation Age of Onset  . Lung cancer Father   . Prostate cancer Father   . Hyperlipidemia Brother   . Hypertension Brother   . Obesity Brother   . Heart disease Mother   . Lung cancer Sister   . Kidney disease Unknown        Maternal grandparent  . Breast cancer Neg Hx     Social History Social History   Tobacco Use  . Smoking status: Former Smoker    Years: 10.00    Types: Cigarettes    Last attempt to quit: 10/03/1969    Years since quitting: 48.5  . Smokeless tobacco: Never Used  Substance Use Topics  . Alcohol use: Yes    Alcohol/week: 0.0 oz    Comment: couple drinks/week  . Drug use: No    Review of Systems  Constitutional: No fever/chills Eyes: No visual changes. ENT: No sore throat. Cardiovascular: Denies chest pain. Respiratory: Denies shortness of breath. Gastrointestinal: No abdominal pain.  No nausea, no vomiting.  No diarrhea.  No constipation. Genitourinary: Negative for dysuria. Musculoskeletal: Negative for back pain. Skin: Negative for rash. Neurological: Negative for headaches, Positive for focal weakness or numbness of bilateral upper extremities and right lower  extremity.Marland Kitchen Psychiatric:Affect and behavior are appropriate. ____________________________________________   PHYSICAL EXAM:  VITAL SIGNS: ED Triage Vitals  Enc Vitals Group     BP 04/16/18 1924 (!) 121/50     Pulse Rate 04/16/18 1924 97     Resp 04/16/18 1924 19     Temp 04/16/18 1924 98.2 F (36.8 C)     Temp Source 04/16/18 1924 Oral     SpO2 04/16/18 1924 96 %     Weight 04/16/18 1925 162 lb (73.5 kg)     Height 04/16/18 1925 5\' 4"  (1.626 m)     Head Circumference --      Peak Flow --      Pain Score 04/16/18 1925 0     Pain Loc --      Pain Edu? --      Excl. in [place]? --     Constitutional: Alert and oriented. Acutely ill appearing and in no acute  distress. Eyes: Conjunctivae are normal. PERRL. Head: Atraumatic. Nose: No congestion/rhinnorhea. Mouth/Throat: Mucous membranes are moist.  Oropharynx non-erythematous. Neck: No stridor.   Cardiovascular: Normal rate, regular rhythm. Grossly normal heart sounds.  Good peripheral circulation. Respiratory: Normal respiratory effort.  No retractions. Lungs CTAB. Gastrointestinal: Soft and nontender. No distention. No abdominal bruits. No CVA tenderness. Musculoskeletal: No lower extremity tenderness nor edema.  No joint effusions. Neurologic:  Normal speech and language. Bilateral upper extremity weakness against resistance. Right lower extremity weakness during straight leg raise. Skin:  Skin is warm, dry and intact. No rash noted. Psychiatric: Mood and affect are normal. Speech and behavior are normal.  ____________________________________________   LABS (all labs ordered are listed, but only abnormal results are displayed)  Labs Reviewed  COMPREHENSIVE METABOLIC PANEL - Abnormal; Notable for the following components:      Result Value   Glucose, Bld 169 (*)    GFR calc non Af Amer 53 (*)    All other components within normal limits  CBC WITH DIFFERENTIAL/PLATELET - Abnormal; Notable for the following components:   RBC  3.56 (*)    MCH 35.3 (*)    Platelets 127 (*)    All other components within normal limits  URINALYSIS, COMPLETE (UACMP) WITH MICROSCOPIC - Abnormal; Notable for the following components:   Color, Urine YELLOW (*)    APPearance TURBID (*)    Leukocytes, UA MODERATE (*)    Bacteria, UA RARE (*)    All other components within normal limits  TROPONIN I   ____________________________________________  EKG  ED ECG REPORT I, Sherrie George, the attending physician, personally viewed and interpreted this ECG.   Date: 04/16/2018  EKG Time: 10:34 PM  Rate: 93  Rhythm: Sinus rhythm  Axis: Normal  Intervals: Abnormal R wave progression, early transition  ST&T Change: No ST elevation  ____________________________________________  RADIOLOGY  ED MD interpretation: CT head negative for acute intracranial findings per radiology.  Official radiology report(s): Ct Head Wo Contrast  Result Date: 04/16/2018 CLINICAL DATA:  Focal neurologic deficit, heaviness in the right leg. EXAM: CT HEAD WITHOUT CONTRAST TECHNIQUE: Contiguous axial images were obtained from the base of the skull through the vertex without intravenous contrast. COMPARISON:  None. FINDINGS: Brain: Faint calcifications in the globus pallidus nucleus, likely physiologic. Periventricular white matter and corona radiata hypodensities favor chronic ischemic microvascular white matter disease. Otherwise, the brainstem, cerebellum, cerebral peduncles, thalami, basal ganglia, basilar cisterns, and ventricular system appear within normal limits. No intracranial hemorrhage, mass lesion, or acute CVA. Vascular: There is atherosclerotic calcification of the cavernous carotid arteries bilaterally. Skull: Unremarkable Sinuses/Orbits: Unremarkable Other: No supplemental non-categorized findings. IMPRESSION: 1. No acute intracranial findings. 2. Periventricular white matter and corona radiata hypodensities favor chronic ischemic microvascular white  matter disease. 3. There is atherosclerotic calcification of the cavernous carotid arteries bilaterally. Electronically Signed   By: Van Clines M.D.   On: 04/16/2018 20:15    ____________________________________________   PROCEDURES  Procedure(s) performed: None  Procedures  Critical Care performed: No  ____________________________________________   INITIAL IMPRESSION / ASSESSMENT AND PLAN / ED COURSE  As part of my medical decision making, I reviewed the following data within the electronic MEDICAL RECORD NUMBER Notes from prior ED visits   77 year old female presenting to the emergency department for treatment and evaluation of extremity tremors that started after eating supper and walking to the car.  Her husband denies noticing any slurred speech or change in behavior.  She has not had any loss  of consciousness or near syncopal episodes.  She denies dizziness.  She does have a history of tremor and was evaluated by Dr. Carles Collet.  She also has a history of hand and foot spasms.  She states that the spasms in the right lower extremity are causing cramping that goes from her right foot all the way to her hip.  She denies any back pain.   CT is negative for acute findings. She has no focal neuro weakness on initial exam.  ----------------------------------------- 9:45 PM on 04/16/2018 -----------------------------------------  Patient reports improvement in pain of her right lower extremity after fentanyl, continues to have spasms and tremors in all 4 extremities which continue to be worse on the right side.  Upon first entering the room, the tremors are subtle, but the longer conversation the more tremors occur.  ----------------------------------------- 12:10 PM on 04/16/2018 -----------------------------------------  Patient given Norflex with significant improvement in muscle spasms and tremors. She is unable to ambulate due to right lower extremity pain and now complains of  inability to move the right lower extremity. She has a new right arm drift. Right lower extremity is slightly weaker than the left. MRI of the brain and neck ordered. Patient and husband notified of plan and agree.   ----------------------------------------- 12:48 AM on 04/17/2018 -----------------------------------------  Dr. Jannifer Franklin to admit. Awaiting MRI. ____________________________________________   FINAL CLINICAL IMPRESSION(S) / ED DIAGNOSES  Final diagnoses:  Weakness of right lower extremity  Tremor  Acute cystitis without hematuria  Right arm weakness     ED Discharge Orders    None       Note:  This document was prepared using Dragon voice recognition software and may include unintentional dictation errors.    Victorino Dike, FNP 04/17/18 [id number]    Nance Pear, MD 04/17/18 [phone]

## 2018-04-16 NOTE — ED Triage Notes (Signed)
Pt arrived from [facility] via EMS with complaints of tremors which started about 30 minutes ago. Pt is alert and oriented x 4. She has tingling in the hands, feels heaviness in her right leg and has no vision changes. Pt told  EMS that she stopped her Losartan on Thursday because of something she saw in the news that frightened her about the drug. VS per EMS BP-140/80 BS-238 and she had some PVCs.

## 2018-04-17 ENCOUNTER — Inpatient Hospital Stay: Payer: Medicare Other

## 2018-04-17 ENCOUNTER — Emergency Department: Payer: Medicare Other

## 2018-04-17 ENCOUNTER — Encounter: Payer: Self-pay | Admitting: Internal Medicine

## 2018-04-17 ENCOUNTER — Inpatient Hospital Stay (HOSPITAL_COMMUNITY)
Admit: 2018-04-17 | Discharge: 2018-04-17 | Disposition: A | Discharge status: Home / Self Care | Payer: Medicare Other | Attending: Internal Medicine | Admitting: Internal Medicine

## 2018-04-17 ENCOUNTER — Other Ambulatory Visit: Payer: Self-pay

## 2018-04-17 DIAGNOSIS — I482 Chronic atrial fibrillation: Secondary | ICD-10-CM | POA: Diagnosis not present

## 2018-04-17 DIAGNOSIS — I63233 Cerebral infarction due to unspecified occlusion or stenosis of bilateral carotid arteries: Secondary | ICD-10-CM | POA: Diagnosis not present

## 2018-04-17 DIAGNOSIS — I059 Rheumatic mitral valve disease, unspecified: Secondary | ICD-10-CM | POA: Diagnosis present

## 2018-04-17 DIAGNOSIS — Z86010 Personal history of colonic polyps: Secondary | ICD-10-CM | POA: Diagnosis not present

## 2018-04-17 DIAGNOSIS — N3000 Acute cystitis without hematuria: Secondary | ICD-10-CM | POA: Diagnosis present

## 2018-04-17 DIAGNOSIS — I639 Cerebral infarction, unspecified: Secondary | ICD-10-CM | POA: Diagnosis not present

## 2018-04-17 DIAGNOSIS — K219 Gastro-esophageal reflux disease without esophagitis: Secondary | ICD-10-CM | POA: Diagnosis present

## 2018-04-17 DIAGNOSIS — M1990 Unspecified osteoarthritis, unspecified site: Secondary | ICD-10-CM | POA: Diagnosis present

## 2018-04-17 DIAGNOSIS — I2510 Atherosclerotic heart disease of native coronary artery without angina pectoris: Secondary | ICD-10-CM | POA: Diagnosis present

## 2018-04-17 DIAGNOSIS — R251 Tremor, unspecified: Secondary | ICD-10-CM | POA: Diagnosis present

## 2018-04-17 DIAGNOSIS — I10 Essential (primary) hypertension: Secondary | ICD-10-CM | POA: Diagnosis not present

## 2018-04-17 DIAGNOSIS — Z881 Allergy status to other antibiotic agents status: Secondary | ICD-10-CM | POA: Diagnosis not present

## 2018-04-17 DIAGNOSIS — K5790 Diverticulosis of intestine, part unspecified, without perforation or abscess without bleeding: Secondary | ICD-10-CM | POA: Diagnosis present

## 2018-04-17 DIAGNOSIS — R29706 NIHSS score 6: Secondary | ICD-10-CM | POA: Diagnosis present

## 2018-04-17 DIAGNOSIS — Z9842 Cataract extraction status, left eye: Secondary | ICD-10-CM | POA: Diagnosis not present

## 2018-04-17 DIAGNOSIS — I4891 Unspecified atrial fibrillation: Secondary | ICD-10-CM | POA: Diagnosis present

## 2018-04-17 DIAGNOSIS — Z91040 Latex allergy status: Secondary | ICD-10-CM | POA: Diagnosis not present

## 2018-04-17 DIAGNOSIS — Z96652 Presence of left artificial knee joint: Secondary | ICD-10-CM | POA: Diagnosis present

## 2018-04-17 DIAGNOSIS — Z880 Allergy status to penicillin: Secondary | ICD-10-CM | POA: Diagnosis not present

## 2018-04-17 DIAGNOSIS — N390 Urinary tract infection, site not specified: Secondary | ICD-10-CM | POA: Diagnosis present

## 2018-04-17 DIAGNOSIS — Z91048 Other nonmedicinal substance allergy status: Secondary | ICD-10-CM | POA: Diagnosis not present

## 2018-04-17 DIAGNOSIS — K589 Irritable bowel syndrome without diarrhea: Secondary | ICD-10-CM | POA: Diagnosis present

## 2018-04-17 DIAGNOSIS — E785 Hyperlipidemia, unspecified: Secondary | ICD-10-CM | POA: Diagnosis not present

## 2018-04-17 DIAGNOSIS — Z853 Personal history of malignant neoplasm of breast: Secondary | ICD-10-CM | POA: Diagnosis not present

## 2018-04-17 DIAGNOSIS — Z888 Allergy status to other drugs, medicaments and biological substances status: Secondary | ICD-10-CM | POA: Diagnosis not present

## 2018-04-17 DIAGNOSIS — Z885 Allergy status to narcotic agent status: Secondary | ICD-10-CM | POA: Diagnosis not present

## 2018-04-17 DIAGNOSIS — I6389 Other cerebral infarction: Secondary | ICD-10-CM | POA: Diagnosis present

## 2018-04-17 DIAGNOSIS — I6523 Occlusion and stenosis of bilateral carotid arteries: Secondary | ICD-10-CM | POA: Diagnosis present

## 2018-04-17 LAB — LIPID PANEL
CHOL/HDL RATIO: 6.4 ratio
Cholesterol: 108 mg/dL (ref 0–200)
HDL: 17 mg/dL — AB (ref >40)
LDL Cholesterol: 64 mg/dL (ref 0–99)
Triglycerides: 133 mg/dL (ref <150)
VLDL: 27 mg/dL (ref 0–40)

## 2018-04-17 LAB — CBC
HCT: 34.8 % — ABNORMAL LOW (ref 35.0–47.0)
Hemoglobin: 12.1 g/dL (ref 12.0–16.0)
MCH: 34.7 pg — AB (ref 26.0–34.0)
MCHC: 34.7 g/dL (ref 32.0–36.0)
MCV: 100 fL (ref 80.0–100.0)
Platelets: 113 10*3/uL — ABNORMAL LOW (ref 150–440)
RBC: 3.48 MIL/uL — AB (ref 3.80–5.20)
RDW: 14 % (ref 11.5–14.5)
WBC: 5.4 10*3/uL (ref 3.6–11.0)

## 2018-04-17 LAB — CREATININE, SERUM: Creatinine, Ser: 0.82 mg/dL (ref 0.44–1.00)

## 2018-04-17 LAB — ECHOCARDIOGRAM COMPLETE
Height: 64 in
WEIGHTICAEL: 2592 [oz_av]

## 2018-04-17 MED ORDER — GADOBENATE DIMEGLUMINE 529 MG/ML IV SOLN
15.0000 mL | Freq: Once | INTRAVENOUS | Status: AC | PRN
  Administered 2018-04-17: 15 mL via INTRAVENOUS

## 2018-04-17 MED ORDER — ACETAMINOPHEN 160 MG/5ML PO SOLN
650.0000 mg | ORAL | Status: DC | PRN
  Filled 2018-04-17: qty 20.3

## 2018-04-17 MED ORDER — BACLOFEN 10 MG PO TABS
10.0000 mg | ORAL_TABLET | Freq: Two times a day (BID) | ORAL | Status: DC
  Administered 2018-04-17 – 2018-04-18 (×3): 10 mg via ORAL
  Filled 2018-04-17 (×4): qty 1

## 2018-04-17 MED ORDER — ACETAMINOPHEN 650 MG RE SUPP: 650.0000 mg | RECTAL | Status: DC | PRN

## 2018-04-17 MED ORDER — LEVETIRACETAM IN NACL 1000 MG/100ML IV SOLN
1000.0000 mg | Freq: Once | INTRAVENOUS | Status: AC
  Administered 2018-04-17: 16:00:00 1000 mg via INTRAVENOUS
  Filled 2018-04-17: qty 100

## 2018-04-17 MED ORDER — ONDANSETRON HCL 4 MG/2ML IJ SOLN: 4.0000 mg | Freq: Four times a day (QID) | INTRAMUSCULAR | Status: DC | PRN

## 2018-04-17 MED ORDER — ATORVASTATIN CALCIUM 20 MG PO TABS
40.0000 mg | ORAL_TABLET | Freq: Every day | ORAL | Status: DC
  Administered 2018-04-17 – 2018-04-18 (×2): 40 mg via ORAL
  Filled 2018-04-17 (×2): qty 2

## 2018-04-17 MED ORDER — ENOXAPARIN SODIUM 40 MG/0.4ML ~~LOC~~ SOLN
40.0000 mg | SUBCUTANEOUS | Status: DC
  Administered 2018-04-17 – 2018-04-18 (×2): 40 mg via SUBCUTANEOUS
  Filled 2018-04-17 (×2): qty 0.4

## 2018-04-17 MED ORDER — SODIUM CHLORIDE 0.9 % IV SOLN
1.0000 g | INTRAVENOUS | Status: DC
  Filled 2018-04-17: qty 10

## 2018-04-17 MED ORDER — SENNOSIDES-DOCUSATE SODIUM 8.6-50 MG PO TABS: 1.0000 | ORAL_TABLET | Freq: Every evening | ORAL | Status: DC | PRN

## 2018-04-17 MED ORDER — LORAZEPAM 2 MG/ML IJ SOLN
2.0000 mg | Freq: Four times a day (QID) | INTRAMUSCULAR | Status: DC | PRN
Start: 2018-04-17 — End: 2018-04-19
  Administered 2018-04-17: 15:00:00 2 mg via INTRAVENOUS
  Filled 2018-04-17 (×2): qty 1

## 2018-04-17 MED ORDER — ASPIRIN 325 MG PO TABS
325.0000 mg | ORAL_TABLET | Freq: Every day | ORAL | Status: DC
  Administered 2018-04-18: 11:00:00 325 mg via ORAL
  Filled 2018-04-17: qty 1

## 2018-04-17 MED ORDER — LEVETIRACETAM 500 MG PO TABS
500.0000 mg | ORAL_TABLET | Freq: Two times a day (BID) | ORAL | Status: DC
  Administered 2018-04-17 – 2018-04-19 (×4): 500 mg via ORAL
  Filled 2018-04-17 (×5): qty 1

## 2018-04-17 MED ORDER — ASPIRIN 81 MG PO CHEW
324.0000 mg | CHEWABLE_TABLET | Freq: Once | ORAL | Status: AC
  Administered 2018-04-17: 324 mg via ORAL
  Filled 2018-04-17: qty 4

## 2018-04-17 MED ORDER — LORAZEPAM 2 MG/ML IJ SOLN
2.0000 mg | Freq: Once | INTRAMUSCULAR | Status: AC
  Administered 2018-04-17: 2 mg via INTRAVENOUS
  Filled 2018-04-17: qty 1

## 2018-04-17 MED ORDER — ACETAMINOPHEN 325 MG PO TABS
650.0000 mg | ORAL_TABLET | ORAL | Status: DC | PRN
  Administered 2018-04-18: 23:00:00 650 mg via ORAL
  Filled 2018-04-17: qty 2

## 2018-04-17 MED ORDER — CYCLOBENZAPRINE HCL 10 MG PO TABS
5.0000 mg | ORAL_TABLET | Freq: Three times a day (TID) | ORAL | Status: DC | PRN
  Administered 2018-04-17: 14:00:00 5 mg via ORAL
  Filled 2018-04-17: qty 1

## 2018-04-17 MED ORDER — STROKE: EARLY STAGES OF RECOVERY BOOK
Freq: Once | Status: AC
  Administered 2018-04-17: 10:00:00

## 2018-04-17 MED ORDER — SODIUM CHLORIDE 0.9 % IV SOLN
INTRAVENOUS | Status: DC
  Administered 2018-04-17 – 2018-04-18 (×3): via INTRAVENOUS

## 2018-04-17 NOTE — ED Notes (Signed)
Pt's O2 sat noted to be 85% on RA while sleeping. When pt is awakened sat immediately increases to 96% and drops again when asleep. Placed on 2L n/c while asleep.

## 2018-04-17 NOTE — Progress Notes (Signed)
Inpatient Rehabilitation Admissions Coordinator   Patient was screened by Cleatrice Burke for appropriateness for an Inpatient Acute Rehab Admit at the [facility] in [place] per PT recommendation. OT eval pending. I will follow up with Acute team tomorrow as well as patient and family to assist in planning dispo.  Danne Baxter, RN, MSN Rehab Admissions Coordinator [phone] 04/17/2018 7:22 PM

## 2018-04-17 NOTE — Progress Notes (Signed)
OT Cancellation Note  Patient Details Name: Isabel Hernandez MRN: [id number] DOB: December 19, 1940   Cancelled Treatment:    Reason Eval/Treat Not Completed: Patient at procedure or test/ unavailable. Pt being taken out of room for additional diagnostic testing on 2nd attempt. Will re-attempt OT evaluation next date as pt is available and medically appropriate.   Jeni Salles, MPH, MS, OTR/L ascom [phone] 04/17/18, 3:28 PM

## 2018-04-17 NOTE — H&P (Addendum)
Isabel Hernandez at [place] NAME: Isabel Hernandez    MR#:  [id number]  DATE OF BIRTH:  1941-04-26  DATE OF ADMISSION:  04/16/2018  PRIMARY CARE PHYSICIAN: Glendon Axe, MD   REQUESTING/REFERRING PHYSICIAN: Dr. Owens Shark.  CHIEF COMPLAINT:   Chief Complaint  Patient presents with  . Tremors    HISTORY OF PRESENT ILLNESS:  Isabel Hernandez  is a 77 y.o. female with a known history of A. fib, hypertension, hyperlipidemia, carotid artery occlusion, CAD, diverticulosis, UTI etc.  The patient presents to the ED with tremors started approximately 30 minutes prior to arrival.  She also complains of heaviness on the right leg, tingling and numbness in her hands and feet.  The patient denies any similar symptoms before.  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 PAST MEDICAL HISTORY:   Past Medical History:  Diagnosis Date  . A-fib ([place])   . Arthritis   . Breast cancer ([place]) 2002   RT LUMPECTOMY  . Bronchitis   . Carotid artery narrowings   . 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 Carotid artery occlusion    RIGHT 50% BLOCKAGE  . Colon polyps   . Complication of anesthesia    shaking/ FOLLOWING LOCAL AT DENTIST , drop O2 sats TO 50% DURING COLONOSCOPY  . Coronary artery disease   . Diverticulosis   . Dysrhythmia   . Environmental allergies   . GERD (gastroesophageal reflux disease)   . History of hiatal hernia   . Hyperlipidemia   . Hypertension   . IBS (irritable bowel syndrome)   . Mitral valve disorder   . Shortness of breath dyspnea   . Syncope and collapse   . UTI (lower urinary tract infection)     PAST SURGICAL HISTORY:   Past Surgical History:  Procedure Laterality Date  . APPENDECTOMY    . BREAST BIOPSY Right 2002   POS  . BREAST CYST ASPIRATION Right   . BREAST EXCISIONAL BIOPSY Left 1997   NEG  . BREAST LUMPECTOMY    . CATARACT EXTRACTION  W/PHACO Right 06/07/2016   Procedure: CATARACT EXTRACTION PHACO AND INTRAOCULAR LENS PLACEMENT (IOC);  Surgeon: Birder Robson, MD;  Location: [facility] ORS;  Service: Ophthalmology;  Laterality: Right;  [place] 00:51 AP% 19.6 CDE 10.09 Fluid pack lot # [id number]  . CATARACT EXTRACTION W/PHACO Left 07/05/2016   Procedure: CATARACT EXTRACTION PHACO AND INTRAOCULAR LENS PLACEMENT (IOC);  Surgeon: Birder Robson, MD;  Location: [facility] ORS;  Service: Ophthalmology;  Laterality: Left;  [place] 00:38 AP% 24.9 CDE 9.60 Fluid Pack lot # [id number]  . CESAREAN SECTION    . TONSILLECTOMY    . TOTAL KNEE ARTHROPLASTY Left     SOCIAL HISTORY:   Social History   Tobacco Use  . Smoking status: Former Smoker    Years: 10.00    Types: Cigarettes    Last attempt to quit: 10/03/1969    Years since quitting: 48.5  . Smokeless tobacco: Never Used  Substance Use Topics  . Alcohol use: Yes    Alcohol/week: 0.0 oz    Comment: couple drinks/week    FAMILY HISTORY:   Family History  Problem Relation Age of Onset  . Lung cancer Father   . Prostate cancer Father   . Hyperlipidemia Brother   . Hypertension Brother   . Obesity Brother   . Heart disease Mother   . Lung  cancer Sister   . Kidney disease Unknown        Maternal grandparent  . Breast cancer Neg Hx     DRUG ALLERGIES:   Allergies  Allergen Reactions  . Nitroglycerin Other (See Comments)    Can only use the patch  Turned lobster red and felt faint with sublingual NTG  . Penicillins Hives    Hives Has patient had a PCN reaction causing immediate rash, facial/tongue/throat swelling, SOB or lightheadedness with hypotension: YES Has patient had a PCN reaction causing severe rash involving mucus membranes or skin necrosis: NO Has patient had a PCN reaction that required hospitalization NO Has patient had a PCN reaction occurring within the last 10 years: No If all of the above answers are "NO", then may proceed with Cephalosporin use.  . Biaxin  [Clarithromycin] Rash    Rash   . Latex Rash  . Lidocaine Swelling    Eye swelling Tolerates Tetracaine with epidural steroid injections (04/10/17)  . Nickel Rash  . Oxycodone Hives  . Tetracyclines & Related Rash       . Tizanidine Rash    REVIEW OF SYSTEMS:   Review of Systems  Constitutional: Negative for chills, fever and malaise/fatigue.  HENT: Negative for sore throat.   Eyes: Negative for blurred vision and double vision.  Respiratory: Negative for cough, hemoptysis, shortness of breath, wheezing and stridor.   Cardiovascular: Negative for chest pain, palpitations, orthopnea and leg swelling.  Gastrointestinal: Negative for abdominal pain, blood in stool, constipation, diarrhea, melena, nausea and vomiting.  Genitourinary: Negative for dysuria, flank pain, frequency, hematuria and urgency.  Musculoskeletal: Negative for back pain and joint pain.  Skin: Negative for rash.  Neurological: Positive for tingling, tremors and focal weakness. Negative for dizziness, sensory change, seizures, loss of consciousness and headaches.  Endo/Heme/Allergies: Negative for polydipsia.  Psychiatric/Behavioral: Negative for depression. The patient is nervous/anxious.     MEDICATIONS AT HOME:   Prior to Admission medications   Medication Sig Start Date End Date Taking? Authorizing Provider  amLODipine (NORVASC) 5 MG tablet TAKE ONE TABLET EVERY DAY 12/07/17  Yes Wellington Hampshire, MD  atorvastatin (LIPITOR) 20 MG tablet TAKE ONE TABLET BY MOUTH EVERY DAY 10/02/17  Yes Wellington Hampshire, MD  fenofibrate (TRICOR) 145 MG tablet TAKE ONE TABLET BY MOUTH EVERY DAY 10/02/17  Yes Wellington Hampshire, MD  fluticasone (FLONASE) 50 MCG/ACT nasal spray Place 2 sprays into both nostrils daily. 05/09/17  Yes Leone Haven, MD  metoprolol tartrate (LOPRESSOR) 25 MG tablet TAKE 1 TABLET BY MOUTH TWICE DAILY 03/23/18  Yes Wellington Hampshire, MD  Multiple Vitamin (MULTIVITAMIN WITH MINERALS) TABS tablet Take 1  tablet by mouth daily.   Yes [provider]  losartan (COZAAR) 50 MG tablet TAKE 1 TABLET BY MOUTH DAILY Patient not taking: Reported on 04/17/2018 03/22/18   Wellington Hampshire, MD  Vitamin D, Ergocalciferol, (DRISDOL) 50000 units CAPS capsule Take 1 capsule (50,000 Units total) by mouth every 7 (seven) days. Patient not taking: Reported on 04/17/2018 09/08/17   Lyndal Pulley, DO      VITAL SIGNS:  Blood pressure (!) 143/74, pulse 79, temperature 98.2 F (36.8 C), temperature source Oral, resp. rate 16, height 5\' 4"  (1.626 m), weight 162 lb (73.5 kg), SpO2 96 %.  PHYSICAL EXAMINATION:  Physical Exam  GENERAL:  77 y.o.-year-old patient lying in the bed with no acute distress.  EYES: Pupils equal, round, reactive to light and accommodation. No scleral icterus. Extraocular  muscles intact.  HEENT: Head atraumatic, normocephalic. Oropharynx and nasopharynx clear.  NECK:  Supple, no jugular venous distention. No thyroid enlargement, no tenderness.  LUNGS: Normal breath sounds bilaterally, no wheezing, rales,rhonchi or crepitation. No use of accessory muscles of respiration.  CARDIOVASCULAR: S1, S2 normal. No murmurs, rubs, or gallops.  ABDOMEN: Soft, nontender, nondistended. Bowel sounds present. No organomegaly or mass.  EXTREMITIES: No pedal edema, cyanosis, or clubbing.  NEUROLOGIC: Cranial nerves II through XII are intact. Muscle strength 5/5 in all extremities except right leg 0-1/5 with decreased sensation. Tremor of hands. Gait not checked.  PSYCHIATRIC: The patient is alert and oriented x 3.  SKIN: No obvious rash, lesion, or ulcer.   LABORATORY PANEL:   CBC Recent Labs  Lab 04/16/18 1932  WBC 5.9  HGB 12.6  HCT 35.5  PLT 127*   ------------------------------------------------------------------------------------------------------------------  Chemistries  Recent Labs  Lab 04/16/18 1932  NA 138  K 3.5  CL 105  CO2 22  GLUCOSE 169*  BUN 18  CREATININE 1.00    CALCIUM 9.3  AST 41  ALT 21  ALKPHOS 46  BILITOT 1.2   ------------------------------------------------------------------------------------------------------------------  Cardiac Enzymes Recent Labs  Lab 04/16/18 1932  TROPONINI <0.03   ------------------------------------------------------------------------------------------------------------------  RADIOLOGY:  Ct Head Wo Contrast  Result Date: 04/16/2018 CLINICAL DATA:  Focal neurologic deficit, heaviness in the right leg. EXAM: CT HEAD WITHOUT CONTRAST TECHNIQUE: Contiguous axial images were obtained from the base of the skull through the vertex without intravenous contrast. COMPARISON:  None. FINDINGS: Brain: Faint calcifications in the globus pallidus nucleus, likely physiologic. Periventricular white matter and corona radiata hypodensities favor chronic ischemic microvascular white matter disease. Otherwise, the brainstem, cerebellum, cerebral peduncles, thalami, basal ganglia, basilar cisterns, and ventricular system appear within normal limits. No intracranial hemorrhage, mass lesion, or acute CVA. Vascular: There is atherosclerotic calcification of the cavernous carotid arteries bilaterally. Skull: Unremarkable Sinuses/Orbits: Unremarkable Other: No supplemental non-categorized findings. IMPRESSION: 1. No acute intracranial findings. 2. Periventricular white matter and corona radiata hypodensities favor chronic ischemic microvascular white matter disease. 3. There is atherosclerotic calcification of the cavernous carotid arteries bilaterally. Electronically Signed   By: Van Clines M.D.   On: 04/16/2018 20:15   Mr Jodene Nam Head Wo Contrast  Result Date: 04/17/2018 CLINICAL DATA:  77 y/o  F; heaviness in the right leg. EXAM: MRI HEAD WITHOUT AND WITH CONTRAST MRA HEAD WITHOUT CONTRAST MRA NECK WITHOUT AND WITH CONTRAST TECHNIQUE: Multiplanar, multiecho pulse sequences of the brain and surrounding structures were obtained without  and with intravenous contrast. Angiographic images of the head were obtained using MRA technique without contrast. Angiographic images of the neck were obtained using MRA technique without and with intravenous contrast. CONTRAST:  13mL MULTIHANCE GADOBENATE DIMEGLUMINE 529 MG/ML IV SOLN COMPARISON:  04/16/2018 CT head. FINDINGS: MRI HEAD FINDINGS Brain: Area of reduced diffusion spanning up to 2 cm in the left paramedian posterior frontal lobe involving precentral gyrus (series 100, image 44) compatible with acute infarction. In the adjacent left posterior frontal lobe periventricular white matter there is a focus of T2 FLAIR hyperintense signal abnormality with increased diffusion and irregular enhancement (series 14, image 36, series 8, image 15, and series 30, image 9). The largest focus of enhancement measures 9 mm. No susceptibility hypointensity of the brain parenchyma to indicate hemorrhage. 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 No acute extra-axial collection, hydrocephalus, or herniation. Vascular: As below. Skull and upper cervical spine: Normal  marrow signal. Sinuses/Orbits: Negative. Other: None. MRA HEAD FINDINGS Anterior circulation: No large vessel occlusion, aneurysm, or significant stenosis is identified. Posterior circulation: No large vessel occlusion, aneurysm, or significant stenosis is identified. Anatomic variation: None significant. MRI NECK FINDINGS Aortic arch: Patent. Right common carotid artery: Patent. Right internal carotid artery: Patent. Motion artifact in the region of the carotid bifurcation. Right vertebral artery: Patent. Left common carotid artery: Patent. Left Internal carotid artery: Patent. Left Vertebral artery: Patent. There is no evidence of hemodynamically significant stenosis by NASCET criteria, occlusion, or aneurysm. IMPRESSION: 1. 2 cm acute infarction within the left  posteromedial frontal lobe including precentral gyrus. No associated hemorrhage or mass effect. 2. Irregularly enhancing T2 FLAIR hyperintense lesion with increased diffusion in left posterior frontal periventricular white matter adjacent to the acute infarct. This probably represents a subacute infarct. Acute inflammatory or demyelinating lesion as well as neoplasm could have this appearance but are considered less likely. Short interval follow-up (4-6 weeks) is recommended to assess interval change. 3. Patent carotid and vertebral arteries. No dissection, aneurysm, or hemodynamically significant stenosis utilizing NASCET criteria. 4. Patent anterior and posterior intracranial circulation. No large vessel occlusion, aneurysm, or significant stenosis. These results were called by telephone at the time of interpretation on 04/17/2018 at 4:24 am to Dr. Sherrie George , who verbally acknowledged these results. Electronically Signed   By: Kristine Garbe M.D.   On: 04/17/2018 04:26   Mr Jodene Nam Neck W Wo Contrast  Result Date: 04/17/2018 CLINICAL DATA:  77 y/o  F; heaviness in the right leg. EXAM: MRI HEAD WITHOUT AND WITH CONTRAST MRA HEAD WITHOUT CONTRAST MRA NECK WITHOUT AND WITH CONTRAST TECHNIQUE: Multiplanar, multiecho pulse sequences of the brain and surrounding structures were obtained without and with intravenous contrast. Angiographic images of the head were obtained using MRA technique without contrast. Angiographic images of the neck were obtained using MRA technique without and with intravenous contrast. CONTRAST:  51mL MULTIHANCE GADOBENATE DIMEGLUMINE 529 MG/ML IV SOLN COMPARISON:  04/16/2018 CT head. FINDINGS: MRI HEAD FINDINGS Brain: Area of reduced diffusion spanning up to 2 cm in the left paramedian posterior frontal lobe involving precentral gyrus (series 100, image 44) compatible with acute infarction. In the adjacent left posterior frontal lobe periventricular white matter there is a focus  of T2 FLAIR hyperintense signal abnormality with increased diffusion and irregular enhancement (series 14, image 36, series 8, image 15, and series 30, image 9). The largest focus of enhancement measures 9 mm. No susceptibility hypointensity of the brain parenchyma to indicate hemorrhage. 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 No acute extra-axial collection, hydrocephalus, or herniation. Vascular: As below. Skull and upper cervical spine: Normal marrow signal. Sinuses/Orbits: Negative. Other: None. MRA HEAD FINDINGS Anterior circulation: No large vessel occlusion, aneurysm, or significant stenosis is identified. Posterior circulation: No large vessel occlusion, aneurysm, or significant stenosis is identified. Anatomic variation: None significant. MRI NECK FINDINGS Aortic arch: Patent. Right common carotid artery: Patent. Right internal carotid artery: Patent. Motion artifact in the region of the carotid bifurcation. Right vertebral artery: Patent. Left common carotid artery: Patent. Left Internal carotid artery: Patent. Left Vertebral artery: Patent. There is no evidence of hemodynamically significant stenosis by NASCET criteria, occlusion, or aneurysm. IMPRESSION: 1. 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 No associated hemorrhage or mass effect. 2. Irregularly enhancing T2 FLAIR hyperintense lesion with increased diffusion in left posterior frontal periventricular white  matter adjacent to the acute infarct. This probably represents a subacute infarct. Acute inflammatory or demyelinating lesion as well as neoplasm could have this appearance but are considered less likely. Short interval follow-up (4-6 weeks) is recommended to assess interval change. 3. Patent carotid and vertebral arteries. No dissection, aneurysm, or hemodynamically significant stenosis utilizing  NASCET criteria. 4. Patent anterior and posterior intracranial circulation. No large vessel occlusion, aneurysm, or significant stenosis. These results were called by telephone at the time of interpretation on 04/17/2018 at 4:24 am to Dr. Sherrie George , who verbally acknowledged these results. Electronically Signed   By: Kristine Garbe M.D.   On: 04/17/2018 04:26   Mr Brain W And Wo Contrast  Result Date: 04/17/2018 CLINICAL DATA:  77 y/o  F; heaviness in the right leg. EXAM: MRI HEAD WITHOUT AND WITH CONTRAST MRA HEAD WITHOUT CONTRAST MRA NECK WITHOUT AND WITH CONTRAST TECHNIQUE: Multiplanar, multiecho pulse sequences of the brain and surrounding structures were obtained without and with intravenous contrast. Angiographic images of the head were obtained using MRA technique without contrast. Angiographic images of the neck were obtained using MRA technique without and with intravenous contrast. CONTRAST:  52mL MULTIHANCE GADOBENATE DIMEGLUMINE 529 MG/ML IV SOLN COMPARISON:  04/16/2018 CT head. FINDINGS: MRI HEAD FINDINGS Brain: Area of reduced diffusion spanning up to 2 cm in the left paramedian posterior frontal lobe involving precentral gyrus (series 100, image 44) compatible with acute infarction. In the adjacent left posterior frontal lobe periventricular white matter there is a focus of T2 FLAIR hyperintense signal abnormality with increased diffusion and irregular enhancement (series 14, image 36, series 8, image 15, and series 30, image 9). The largest focus of enhancement measures 9 mm. No susceptibility hypointensity of the brain parenchyma to indicate hemorrhage. 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 No acute extra-axial collection, hydrocephalus, or herniation. Vascular: As below. Skull and upper cervical spine: Normal marrow signal. Sinuses/Orbits: Negative. Other: None. MRA HEAD FINDINGS  Anterior circulation: No large vessel occlusion, aneurysm, or significant stenosis is identified. Posterior circulation: No large vessel occlusion, aneurysm, or significant stenosis is identified. Anatomic variation: None significant. MRI NECK FINDINGS Aortic arch: Patent. Right common carotid artery: Patent. Right internal carotid artery: Patent. Motion artifact in the region of the carotid bifurcation. Right vertebral artery: Patent. Left common carotid artery: Patent. Left Internal carotid artery: Patent. Left Vertebral artery: Patent. There is no evidence of hemodynamically significant stenosis by NASCET criteria, occlusion, or aneurysm. IMPRESSION: 1. 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 No associated hemorrhage or mass effect. 2. Irregularly enhancing T2 FLAIR hyperintense lesion with increased diffusion in left posterior frontal periventricular white matter adjacent to the acute infarct. This probably represents a subacute infarct. Acute inflammatory or demyelinating lesion as well as neoplasm could have this appearance but are considered less likely. Short interval follow-up (4-6 weeks) is recommended to assess interval change. 3. Patent carotid and vertebral arteries. No dissection, aneurysm, or hemodynamically significant stenosis utilizing NASCET criteria. 4. Patent anterior and posterior intracranial circulation. No large vessel occlusion, aneurysm, or significant stenosis. These results were called by telephone at the time of interpretation on 04/17/2018 at 4:24 am to Dr. Sherrie George , who verbally acknowledged these results. Electronically Signed   By: Kristine Garbe M.D.   On: 04/17/2018 04:26      IMPRESSION AND PLAN:   Acute CVA. The patient will be admitted to medical  floor. Start aspirin 325 mg p.o. daily, Lipitor 40 mg p.o. at bedtime, neuro check, neurology consult.  Carotid duplex and echocardiograph.  PT and OT  evaluation.  Hypertension.  Hold hypertension medication to allow permissive blood pressure due to acute stroke.  History of A. fib.  Continue aspirin for now.  Follow-up echocardiograph. Carotid arterial stenosis.  Follow-up carotid duplex. Discussed with Dr. Doy Mince. All the records are reviewed and case discussed with ED provider. Management plans discussed with the patient, family and they are in agreement.  CODE STATUS: Full code  TOTAL TIME TAKING CARE OF THIS PATIENT: 45 minutes.    Demetrios Loll M.D on 04/17/2018 at 8:14 AM  Between 7am to 6pm - Pager - [phone]  After 6pm go to [email or web address] - Proofreader  [facility]  Office  [phone]  CC: Primary care physician; Glendon Axe, MD   Note: This dictation was prepared with Dragon dictation along with smaller phrase technology. Any transcriptional errors that result from this process are unin

## 2018-04-17 NOTE — ED Notes (Signed)
Attempted to ambulate the pt with no success. Pt could not bend right leg nor support own weight. Helped pt back into bed

## 2018-04-17 NOTE — Progress Notes (Signed)
*  PRELIMINARY RESULTS* Echocardiogram 2D Echocardiogram has been performed.  Isabel Hernandez 04/17/2018, 2:11 PM

## 2018-04-17 NOTE — Evaluation (Signed)
Physical Therapy Evaluation Patient Details Name: Isabel Hernandez MRN: [id number] DOB: [birth year] Today's Date: 04/17/2018   History of Present Illness  77 y.o. female with a known history of A. fib, hypertension, hyperlipidemia, carotid artery occlusion, CAD, diverticulosis, UTI.  She arrived morning of eval with tremor, heaviness on the right leg (and UE), tingling and numbness in her hands and feet.  The patient denies any similar symptoms before.  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  She had no issues with communication and following instructions but struggled with severe R sided weakness (LE > UE) and when trying to stand could not attain R ankle neutral to allow WBing through R LE well enough to allow her to take steps in standing.  Pt was completely independent just 24 hours ago and is highly motivated to do as much as she can with PT, inpatient acute PT (and OT) is the best option for her at this time.    Follow Up Recommendations CIR    Equipment Recommendations  (TBD at next venue of care)    Recommendations for Other Services       Precautions / Restrictions Precautions Precautions: Fall Restrictions Weight Bearing Restrictions: No      Mobility  Bed Mobility Overal bed mobility: Needs Assistance Bed Mobility: Supine to Sit;Sidelying to Sit   Sidelying to sit: Mod assist Supine to sit: Mod assist     General bed mobility comments: Pt able to initiate getting body to EOB and showed great effort, however could not get R LE to EOB or use R UE to help it and neede direct assist to shift to EOB and lift trunk to sitting.  Getting back into bed pt needed direct assist to lift  R LE and to control torso  Transfers Overall transfer level: Needs assistance Equipment used: Rolling walker (2 wheeled) Transfers: Sit to/from Stand Sit to Stand: Mod assist         General transfer comment: Pt able to use L U&LEs to assist to standing but tremors and inability to effectively take weight though R LE necessitated direct physical assist to rise and maintain balance to get UE to walker and maintain standing..  Ambulation/Gait             General Gait Details: Pt unable to AROM step with R LE and only managed small side shift with direct assist from PT.  She did maintain static standing balance briefly with CGA, but generally needed min/mod assist to maintain balance.   Stairs            Wheelchair Mobility    Modified Rankin (Stroke Patients Only)       Balance Overall balance assessment: Needs assistance   Sitting balance-Leahy Scale: Fair Sitting balance - Comments: Pt with occasional need for assist secondary to tremors but generally held sitting balance w/ only CGA     Standing balance-Leahy Scale: Poor Standing balance comment: Pt's lack of strength/control in L LE as well as persistent tremors made static standing balance very difficult for her, heavy assist most of the time from PT.  Pertinent Vitals/Pain Pain Assessment: 0-10 Pain Score: 7  Pain Location: R hip focused, with less severe pain down R leg all the way to toes    Home Living Family/patient expects to be discharged to:: Private residence Living Arrangements: Spouse/significant other   Type of Home: (cottage at [employer]) Home Access: Level entry       Home Equipment: None(states she can easily borrow one from neighbors)      Prior Function Level of Independence: Independent         Comments: Pt able to drive, run errands, some chronic R hip pain with prolonged ambulation     Hand Dominance        Extremity/Trunk  Assessment   Upper Extremity Assessment Upper Extremity Assessment: RUE deficits/detail(L UE WFL apart from significant tremoring) RUE Deficits / Details: R UE with tremors t/o the session, decreased quality of motion.  Strength grossly 3-/5 t/o RUE Sensation: decreased light touch;decreased proprioception RUE Coordination: decreased gross motor;decreased fine motor    Lower Extremity Assessment Lower Extremity Assessment: RLE deficits/detail(L LE strength WFL apart from tremoring) RLE Deficits / Details: ankle in inversion/plantar flexed posture, only minimal ability to evert/DF in very limited range.  Hip grossly 2+/5, knee grossly 3-/5 all with tremor and pain during activity RLE Sensation: decreased light touch;decreased proprioception RLE Coordination: decreased gross motor;decreased fine motor       Communication   Communication: No difficulties  Cognition Arousal/Alertness: Awake/alert Behavior During Therapy: Anxious Overall Cognitive Status: Within Functional Limits for tasks assessed                                 General Comments: Pt clearly frustrated with the change in status from normal/this AM      General Comments      Exercises     Assessment/Plan    PT Assessment Patient needs continued PT services  PT Problem List Decreased strength;Decreased range of motion;Decreased activity tolerance;Decreased balance;Decreased mobility;Decreased coordination;Decreased knowledge of use of DME;Decreased safety awareness;Pain       PT Treatment Interventions Gait training;DME instruction;Functional mobility training;Therapeutic activities;Therapeutic exercise;Balance training;Neuromuscular re-education;Patient/family education    PT Goals (Current goals can be found in the Care Plan section)  Acute Rehab PT Goals Patient Stated Goal: get back to her normal active self PT Goal Formulation: With patient Time For Goal Achievement: 05/01/18 Potential to  Achieve Goals: Fair    Frequency 7X/week   Barriers to discharge        Co-evaluation               AM-PAC PT "6 Clicks" Daily Activity  Outcome Measure Difficulty turning over in bed (including adjusting bedclothes, sheets and blankets)?: A Lot Difficulty moving from lying on back to sitting on the side of the bed? : Unable Difficulty sitting down on and standing up from a chair with arms (e.g., wheelchair, bedside commode, etc,.)?: Unable Help needed moving to and from a bed to chair (including a wheelchair)?: Total Help needed walking in hospital room?: Total Help needed climbing 3-5 steps with a railing? : Total 6 Click Score: 7    End of Session Equipment Utilized During Treatment: Gait belt Activity Tolerance: Patient limited by pain(R hip pain) Patient left: in bed;with call bell/phone within reach;with nursing/sitter in room Nurse Communication: Mobility status PT Visit Diagnosis: Muscle weakness (generalized) (M62.81);Difficulty in walking, not elsewhere classified (R26.2);Unsteadiness on feet (R26.81);Apraxia (R48.2);Hemiplegia and hemiparesis Hemiplegia - Right/Left:  Right Hemiplegia - dominant/non-dominant: Dominant Hemiplegia - caused by: Cerebral infarction    Time: 1457-1530 PT Time Calculation (min) (ACUTE ONLY): 33 min   Charges:   PT Evaluation $PT Eval Moderate Complexity: 1 Mod PT Treatments $Therapeutic Activity: 8-22 mins   PT G Codes:        Kreg Shropshire, DPT 04/17/2018, 5:37 PM

## 2018-04-17 NOTE — Progress Notes (Addendum)
SLP Cancellation Note  Patient Details Name: Isabel Hernandez MRN: [id number] DOB: 12/11/1940   Cancelled treatment:       Reason Eval/Treat Not Completed: SLP screened, no needs identified, will sign off(chart reviewed; consulted NSG then met w/ pt ). Upon entering room, pt was conversing w/ PT and NSG appropriately; noted slight tension in her speech then noted involuntary jerking movements and tremors in UEs. Pt endorsed her speech was "fine" but felt slightly "uncoordinated" when she felt tired. Suspect this could be related to the results of the MRI indicating and infarct within the left posteromedial frontal lobe including precentral gyrus. No overt language deficits noted as pt asked appropriate questions of PT and NSG, followed commands w/ NSG. Pt denied any deficits. NSG reported no difficulty w/ speech-language today. No difficulty swallowing reported by ED, pt, or NSG(swallowing pills adequately w/ water/straw).  Encouraged pt to rest. Gave strategies when talking w/ others to include slowing down, increasing volume, and over-articulation. Recommended pt f/u w/ her PCP post discharge home if her speech fluency is a deficit to her and she feels she needs any further f/u. Pt agreed.     Orinda Kenner, MS, CCC-SLP Deni Berti 04/17/2018, 3:46 PM

## 2018-04-17 NOTE — ED Notes (Signed)
Patient transported from MRI to [facility]

## 2018-04-17 NOTE — Plan of Care (Signed)
Pt admitted today from the ED. VSS. NIH 6. Upon admission noted tremors to bilateral hands/arms/legs, R>L. C/o muscle spasms to RLE. Flexeril given without improvement. Ativan IV push prn initiated for muscle spasms with improvement. Tremors decreased. Pt drowsy and somnolent post ativan and Keppra. O2 sats 91% on RA. 2L O2 applied, O2 sats up to 95%.

## 2018-04-17 NOTE — ED Notes (Signed)
This RN notices that when first walking in room pt is nice and still, but once you start doing anything to her or just speaking with her the tremors worsen.

## 2018-04-17 NOTE — Consult Note (Signed)
Referring Physician: Bridgett Larsson    Chief Complaint: Tremor  HPI: Isabel Hernandez is an 77 y.o. female who reports that she was getting into a car on yesterday and her right leg became acutely heavy. Patient then began to experience intermittent jerking of the right upper and lower extremity.  RLE would also spasm as well.  With no improvement patient presented for evaluation. Initial NIHSS of 6.   Patient also reports that a few weeks earlier she experienced numbness in her fingers on both hands but this resolved after a few days.  Date last known well: Date: 04/16/2018 Time last known well: Time: 18:30 tPA Given: No: Not initially felt to be a stroke  Past Medical History:  Diagnosis Date  . A-fib ([place])   . Arthritis   . Breast cancer ([place]) 2002   RT LUMPECTOMY  . Bronchitis   . Carotid artery narrowings   . 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 Carotid artery occlusion    RIGHT 50% BLOCKAGE  . Colon polyps   . Complication of anesthesia    shaking/ FOLLOWING LOCAL AT DENTIST , drop O2 sats TO 50% DURING COLONOSCOPY  . Coronary artery disease   . Diverticulosis   . Dysrhythmia   . Environmental allergies   . GERD (gastroesophageal reflux disease)   . History of hiatal hernia   . Hyperlipidemia   . Hypertension   . IBS (irritable bowel syndrome)   . Mitral valve disorder   . Shortness of breath dyspnea   . Syncope and collapse   . UTI (lower urinary tract infection)     Past Surgical History:  Procedure Laterality Date  . APPENDECTOMY    . BREAST BIOPSY Right 2002   POS  . BREAST CYST ASPIRATION Right   . BREAST EXCISIONAL BIOPSY Left 1997   NEG  . BREAST LUMPECTOMY    . CATARACT EXTRACTION W/PHACO Right 06/07/2016   Procedure: CATARACT EXTRACTION PHACO AND INTRAOCULAR LENS PLACEMENT (IOC);  Surgeon: Birder Robson, MD;  Location: [facility] ORS;  Service: Ophthalmology;  Laterality: Right;  [place] 00:51 AP% 19.6 CDE 10.09 Fluid pack lot # [id number]  . CATARACT EXTRACTION  W/PHACO Left 07/05/2016   Procedure: CATARACT EXTRACTION PHACO AND INTRAOCULAR LENS PLACEMENT (IOC);  Surgeon: Birder Robson, MD;  Location: [facility] ORS;  Service: Ophthalmology;  Laterality: Left;  [place] 00:38 AP% 24.9 CDE 9.60 Fluid Pack lot # [id number]  . CESAREAN SECTION    . TONSILLECTOMY    . TOTAL KNEE ARTHROPLASTY Left     Family History  Problem Relation Age of Onset  . Lung cancer Father   . Prostate cancer Father   . Hyperlipidemia Brother   . Hypertension Brother   . Obesity Brother   . Heart disease Mother   . Lung cancer Sister   . Kidney disease Unknown        Maternal grandparent  . Breast cancer Neg Hx    Social History:  reports that she quit smoking about 48 years ago. Her smoking use included cigarettes. She quit after 10.00 years of use. She has never used smokeless tobacco. She reports that she drinks alcohol. She reports that she does not use drugs.  Allergies:  Allergies  Allergen Reactions  . Nitroglycerin Other (See Comments)    Can only use the patch  Turned lobster red and felt faint with sublingual NTG  . Penicillins Hives    Hives Has patient had a PCN reaction causing immediate rash, facial/tongue/throat swelling, SOB  or lightheadedness with hypotension: YES Has patient had a PCN reaction causing severe rash involving mucus membranes or skin necrosis: NO Has patient had a PCN reaction that required hospitalization NO Has patient had a PCN reaction occurring within the last 10 years: No If all of the above answers are "NO", then may proceed with Cephalosporin use.  . Biaxin [Clarithromycin] Rash    Rash   . Latex Rash  . Lidocaine Swelling    Eye swelling Tolerates Tetracaine with epidural steroid injections (04/10/17)  . Nickel Rash  . Oxycodone Hives  . Tetracyclines & Related Rash       . Tizanidine Rash    Medications:  I have reviewed the patient's current medications. Prior to Admission:  Medications Prior to Admission  Medication  Sig Dispense Refill Last Dose  . amLODipine (NORVASC) 5 MG tablet TAKE ONE TABLET EVERY DAY 90 tablet 1 04/16/2018 at Unknown time  . atorvastatin (LIPITOR) 20 MG tablet TAKE ONE TABLET BY MOUTH EVERY DAY 90 tablet 3 04/16/2018 at Unknown time  . fenofibrate (TRICOR) 145 MG tablet TAKE ONE TABLET BY MOUTH EVERY DAY 90 tablet 3 04/16/2018 at Unknown time  . fluticasone (FLONASE) 50 MCG/ACT nasal spray Place 2 sprays into both nostrils daily. 16 g 6 04/16/2018 at Unknown time  . metoprolol tartrate (LOPRESSOR) 25 MG tablet TAKE 1 TABLET BY MOUTH TWICE DAILY 60 tablet 0 04/16/2018 at Unknown time  . Multiple Vitamin (MULTIVITAMIN WITH MINERALS) TABS tablet Take 1 tablet by mouth daily.   04/16/2018 at Unknown time  . losartan (COZAAR) 50 MG tablet TAKE 1 TABLET BY MOUTH DAILY (Patient not taking: Reported on 04/17/2018) 30 tablet 0 Not Taking at Unknown time  . Vitamin D, Ergocalciferol, (DRISDOL) 50000 units CAPS capsule Take 1 capsule (50,000 Units total) by mouth every 7 (seven) days. (Patient not taking: Reported on 04/17/2018) 12 capsule 0 Not Taking at Unknown time   Scheduled: . [START ON 04/18/2018] aspirin  325 mg Oral Daily  . atorvastatin  40 mg Oral q1800  . baclofen  10 mg Oral BID  . enoxaparin (LOVENOX) injection  40 mg Subcutaneous Q24H  . levETIRAcetam  500 mg Oral BID    ROS: History obtained from the patient  General ROS: negative for - chills, fatigue, fever, night sweats, weight gain or weight loss Psychological ROS: negative for - behavioral disorder, hallucinations, memory difficulties, mood swings or suicidal ideation Ophthalmic ROS: negative for - blurry vision, double vision, eye pain or loss of vision ENT ROS: negative for - epistaxis, nasal discharge, oral lesions, sore throat, tinnitus or vertigo Allergy and Immunology ROS: negative for - hives or itchy/watery eyes Hematological and Lymphatic ROS: negative for - bleeding problems, bruising or swollen lymph nodes Endocrine  ROS: negative for - galactorrhea, hair pattern changes, polydipsia/polyuria or temperature intolerance Respiratory ROS: negative for - cough, hemoptysis, shortness of breath or wheezing Cardiovascular ROS: negative for - chest pain, dyspnea on exertion, edema or irregular heartbeat Gastrointestinal ROS: negative for - abdominal pain, diarrhea, hematemesis, nausea/vomiting or stool incontinence Genito-Urinary ROS: negative for - dysuria, hematuria, incontinence or urinary frequency/urgency Musculoskeletal ROS: negative for - joint swelling or muscular weakness Neurological ROS: as noted in HPI Dermatological ROS: negative for rash and skin lesion changes  Physical Examination: Blood pressure (!) 161/67, pulse 94, temperature 98.5 F (36.9 C), temperature source Oral, resp. rate 18, height 5\' 4"  (1.626 m), weight 73.5 kg (162 lb), SpO2 97 %.  HEENT-  Normocephalic, no lesions, without obvious  abnormality.  Normal external eye and conjunctiva.  Normal TM's bilaterally.  Normal auditory canals and external ears. Normal external nose, mucus membranes and septum.  Normal pharynx. Cardiovascular- S1, S2 normal, pulses palpable throughout   Lungs- chest clear, no wheezing, rales, normal symmetric air entry Abdomen- soft, non-tender; bowel sounds normal; no masses,  no organomegaly Extremities- no edema Lymph-no adenopathy palpable Musculoskeletal-no joint tenderness, deformity or swelling Skin-warm and dry, no hyperpigmentation, vitiligo, or suspicious lesions  Neurological Examination   Mental Status: Alert, oriented, thought content appropriate.  Speech fluent without evidence of aphasia.  Able to follow 3 step commands without difficulty. Cranial Nerves: II: Discs flat bilaterally; Visual fields grossly normal, pupils equal, round, reactive to light and accommodation III,IV, VI: ptosis not present, extra-ocular motions intact bilaterally V,VII: smile symmetric, facial light touch sensation  normal bilaterally VIII: hearing normal bilaterally IX,X: gag reflex present XI: bilateral shoulder shrug XII: midline tongue extension Motor: Right : Upper extremity   5-/5    Left:     Upper extremity   5/5  Lower extremity   3-4/5     Lower extremity   5/5 Intermittent jerky movements noted in both the upper and lower right extremities.  Patient also noted at times to go into extension with the RLE with foot turned inward Sensory: Pinprick and light touch intact throughout, bilaterally Deep Tendon Reflexes: 2+ and symmetric throughout Plantars: Right: upgoing   Left: downgoing Cerebellar: Unable to perform due to continuous jerking Gait: not tested due to safety concerns   Laboratory Studies:  Basic Metabolic Panel: Recent Labs  Lab 04/16/18 1932 04/17/18 0740  NA 138  --   K 3.5  --   CL 105  --   CO2 22  --   GLUCOSE 169*  --   BUN 18  --   CREATININE 1.00 0.82  CALCIUM 9.3  --     Liver Function Tests: Recent Labs  Lab 04/16/18 1932  AST 41  ALT 21  ALKPHOS 46  BILITOT 1.2  PROT 6.5  ALBUMIN 4.3   No results for input(s): LIPASE, AMYLASE in the last 168 hours. No results for input(s): AMMONIA in the last 168 hours.  CBC: Recent Labs  Lab 04/16/18 1932 04/17/18 0908  WBC 5.9 5.4  NEUTROABS 2.4  --   HGB 12.6 12.1  HCT 35.5 34.8*  MCV 99.8 100.0  PLT 127* 113*    Cardiac Enzymes: Recent Labs  Lab 04/16/18 1932  TROPONINI <0.03    BNP: Invalid input(s): POCBNP  CBG: No results for input(s): GLUCAP in the last 168 hours.  Microbiology: No results found for this or any previous visit.  Coagulation Studies: No results for input(s): LABPROT, INR in the last 72 hours.  Urinalysis:  Recent Labs  Lab 04/16/18 1932  COLORURINE YELLOW*  LABSPEC 1.010  PHURINE 5.0  GLUCOSEU NEGATIVE  HGBUR NEGATIVE  BILIRUBINUR NEGATIVE  KETONESUR NEGATIVE  PROTEINUR NEGATIVE  NITRITE NEGATIVE  LEUKOCYTESUR MODERATE*    Lipid Panel:     Component Value Date/Time   CHOL 108 04/17/2018 0740   CHOL 133 09/05/2016 1410   TRIG 133 04/17/2018 0740   HDL 17 (L) 04/17/2018 0740   HDL 19 (L) 09/05/2016 1410   CHOLHDL 6.4 04/17/2018 0740   VLDL 27 04/17/2018 0740   LDLCALC 64 04/17/2018 0740   LDLCALC 81 09/05/2016 1410    HgbA1C:  Lab Results  Component Value Date   HGBA1C 5.4 12/16/2016    Urine Drug Screen:  No results found for: LABOPIA, COCAINSCRNUR, LABBENZ, AMPHETMU, THCU, LABBARB  Alcohol Level: No results for input(s): ETH in the last 168 hours.  Other results: EKG: sinus rhythm at 93 bpm.  Imaging: Ct Head Wo Contrast  Result Date: 04/16/2018 CLINICAL DATA:  Focal neurologic deficit, heaviness in the right leg. EXAM: CT HEAD WITHOUT CONTRAST TECHNIQUE: Contiguous axial images were obtained from the base of the skull through the vertex without intravenous contrast. COMPARISON:  None. FINDINGS: Brain: Faint calcifications in the globus pallidus nucleus, likely physiologic. Periventricular white matter and corona radiata hypodensities favor chronic ischemic microvascular white matter disease. Otherwise, the brainstem, cerebellum, cerebral peduncles, thalami, basal ganglia, basilar cisterns, and ventricular system appear within normal limits. No intracranial hemorrhage, mass lesion, or acute CVA. Vascular: There is atherosclerotic calcification of the cavernous carotid arteries bilaterally. Skull: Unremarkable Sinuses/Orbits: Unremarkable Other: No supplemental non-categorized findings. IMPRESSION: 1. No acute intracranial findings. 2. Periventricular white matter and corona radiata hypodensities favor chronic ischemic microvascular white matter disease. 3. There is atherosclerotic calcification of the cavernous carotid arteries bilaterally. Electronically Signed   By: Van Clines M.D.   On: 04/16/2018 20:15   Mr Jodene Nam Head Wo Contrast  Result Date: 04/17/2018 CLINICAL DATA:  77 y/o  F; heaviness in the right leg.  EXAM: MRI HEAD WITHOUT AND WITH CONTRAST MRA HEAD WITHOUT CONTRAST MRA NECK WITHOUT AND WITH CONTRAST TECHNIQUE: Multiplanar, multiecho pulse sequences of the brain and surrounding structures were obtained without and with intravenous contrast. Angiographic images of the head were obtained using MRA technique without contrast. Angiographic images of the neck were obtained using MRA technique without and with intravenous contrast. CONTRAST:  26mL MULTIHANCE GADOBENATE DIMEGLUMINE 529 MG/ML IV SOLN COMPARISON:  04/16/2018 CT head. FINDINGS: MRI HEAD FINDINGS Brain: Area of reduced diffusion spanning up to 2 cm in the left paramedian posterior frontal lobe involving precentral gyrus (series 100, image 44) compatible with acute infarction. In the adjacent left posterior frontal lobe periventricular white matter there is a focus of T2 FLAIR hyperintense signal abnormality with increased diffusion and irregular enhancement (series 14, image 36, series 8, image 15, and series 30, image 9). The largest focus of enhancement measures 9 mm. No susceptibility hypointensity of the brain parenchyma to indicate hemorrhage. 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 No acute extra-axial collection, hydrocephalus, or herniation. Vascular: As below. Skull and upper cervical spine: Normal marrow signal. Sinuses/Orbits: Negative. Other: None. MRA HEAD FINDINGS Anterior circulation: No large vessel occlusion, aneurysm, or significant stenosis is identified. Posterior circulation: No large vessel occlusion, aneurysm, or significant stenosis is identified. Anatomic variation: None significant. MRI NECK FINDINGS Aortic arch: Patent. Right common carotid artery: Patent. Right internal carotid artery: Patent. Motion artifact in the region of the carotid bifurcation. Right vertebral artery: Patent. Left common carotid artery: Patent. Left Internal  carotid artery: Patent. Left Vertebral artery: Patent. There is no evidence of hemodynamically significant stenosis by NASCET criteria, occlusion, or aneurysm. IMPRESSION: 1. 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 No associated hemorrhage or mass effect. 2. Irregularly enhancing T2 FLAIR hyperintense lesion with increased diffusion in left posterior frontal periventricular white matter adjacent to the acute infarct. This probably represents a subacute infarct. Acute inflammatory or demyelinating lesion as well as neoplasm could have this appearance but are considered less likely. Short interval follow-up (4-6 weeks) is recommended to assess interval change. 3. Patent carotid and vertebral arteries. No  dissection, aneurysm, or hemodynamically significant stenosis utilizing NASCET criteria. 4. Patent anterior and posterior intracranial circulation. No large vessel occlusion, aneurysm, or significant stenosis. These results were called by telephone at the time of interpretation on 04/17/2018 at 4:24 am to Dr. Sherrie George , who verbally acknowledged these results. Electronically Signed   By: Kristine Garbe M.D.   On: 04/17/2018 04:26   Mr Jodene Nam Neck W Wo Contrast  Result Date: 04/17/2018 CLINICAL DATA:  77 y/o  F; heaviness in the right leg. EXAM: MRI HEAD WITHOUT AND WITH CONTRAST MRA HEAD WITHOUT CONTRAST MRA NECK WITHOUT AND WITH CONTRAST TECHNIQUE: Multiplanar, multiecho pulse sequences of the brain and surrounding structures were obtained without and with intravenous contrast. Angiographic images of the head were obtained using MRA technique without contrast. Angiographic images of the neck were obtained using MRA technique without and with intravenous contrast. CONTRAST:  19mL MULTIHANCE GADOBENATE DIMEGLUMINE 529 MG/ML IV SOLN COMPARISON:  04/16/2018 CT head. FINDINGS: MRI HEAD FINDINGS Brain: Area of reduced diffusion spanning up to 2 cm in the  left paramedian posterior frontal lobe involving precentral gyrus (series 100, image 44) compatible with acute infarction. In the adjacent left posterior frontal lobe periventricular white matter there is a focus of T2 FLAIR hyperintense signal abnormality with increased diffusion and irregular enhancement (series 14, image 36, series 8, image 15, and series 30, image 9). The largest focus of enhancement measures 9 mm. No susceptibility hypointensity of the brain parenchyma to indicate hemorrhage. 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 No acute extra-axial collection, hydrocephalus, or herniation. Vascular: As below. Skull and upper cervical spine: Normal marrow signal. Sinuses/Orbits: Negative. Other: None. MRA HEAD FINDINGS Anterior circulation: No large vessel occlusion, aneurysm, or significant stenosis is identified. Posterior circulation: No large vessel occlusion, aneurysm, or significant stenosis is identified. Anatomic variation: None significant. MRI NECK FINDINGS Aortic arch: Patent. Right common carotid artery: Patent. Right internal carotid artery: Patent. Motion artifact in the region of the carotid bifurcation. Right vertebral artery: Patent. Left common carotid artery: Patent. Left Internal carotid artery: Patent. Left Vertebral artery: Patent. There is no evidence of hemodynamically significant stenosis by NASCET criteria, occlusion, or aneurysm. IMPRESSION: 1. 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 No associated hemorrhage or mass effect. 2. Irregularly enhancing T2 FLAIR hyperintense lesion with increased diffusion in left posterior frontal periventricular white matter adjacent to the acute infarct. This probably represents a subacute infarct. Acute inflammatory or demyelinating lesion as well as neoplasm could have this appearance but are  considered less likely. Short interval follow-up (4-6 weeks) is recommended to assess interval change. 3. Patent carotid and vertebral arteries. No dissection, aneurysm, or hemodynamically significant stenosis utilizing NASCET criteria. 4. Patent anterior and posterior intracranial circulation. No large vessel occlusion, aneurysm, or significant stenosis. These results were called by telephone at the time of interpretation on 04/17/2018 at 4:24 am to Dr. Sherrie George , who verbally acknowledged these results. Electronically Signed   By: Kristine Garbe M.D.   On: 04/17/2018 04:26   Mr Brain W And Wo Contrast  Result Date: 04/17/2018 CLINICAL DATA:  77 y/o  F; heaviness in the right leg. EXAM: MRI HEAD WITHOUT AND WITH CONTRAST MRA HEAD WITHOUT CONTRAST MRA NECK WITHOUT AND WITH CONTRAST TECHNIQUE: Multiplanar, multiecho pulse sequences of the brain and surrounding structures were obtained without and with intravenous contrast. Angiographic images of the head were obtained using  MRA technique without contrast. Angiographic images of the neck were obtained using MRA technique without and with intravenous contrast. CONTRAST:  5mL MULTIHANCE GADOBENATE DIMEGLUMINE 529 MG/ML IV SOLN COMPARISON:  04/16/2018 CT head. FINDINGS: MRI HEAD FINDINGS Brain: Area of reduced diffusion spanning up to 2 cm in the left paramedian posterior frontal lobe involving precentral gyrus (series 100, image 44) compatible with acute infarction. In the adjacent left posterior frontal lobe periventricular white matter there is a focus of T2 FLAIR hyperintense signal abnormality with increased diffusion and irregular enhancement (series 14, image 36, series 8, image 15, and series 30, image 9). The largest focus of enhancement measures 9 mm. No susceptibility hypointensity of the brain parenchyma to indicate hemorrhage. There is low susceptibility signal within the right precentral gyrus compatible with siderosis from prior fold  subarachnoid hemorrhage. No acute extra-axial collection, hydrocephalus, or herniation. Vascular: As below. Skull and upper cervical spine: Normal marrow signal. Sinuses/Orbits: Negative. Other: None. MRA HEAD FINDINGS Anterior circulation: No large vessel occlusion, aneurysm, or significant stenosis is identified. Posterior circulation: No large vessel occlusion, aneurysm, or significant stenosis is identified. Anatomic variation: None significant. MRI NECK FINDINGS Aortic arch: Patent. Right common carotid artery: Patent. Right internal carotid artery: Patent. Motion artifact in the region of the carotid bifurcation. Right vertebral artery: Patent. Left common carotid artery: Patent. Left Internal carotid artery: Patent. Left Vertebral artery: Patent. There is no evidence of hemodynamically significant stenosis by NASCET criteria, occlusion, or aneurysm. IMPRESSION: 1. 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 No associated hemorrhage or mass effect. 2. Irregularly enhancing T2 FLAIR hyperintense lesion with increased diffusion in left posterior frontal periventricular white matter adjacent to the acute infarct. This probably represents a subacute infarct. Acute inflammatory or demyelinating lesion as well as neoplasm could have this appearance but are considered less likely. Short interval follow-up (4-6 weeks) is recommended to assess interval change. 3. Patent carotid and vertebral arteries. No dissection, aneurysm, or hemodynamically significant stenosis utilizing NASCET criteria. 4. Patent anterior and posterior intracranial circulation. No large vessel occlusion, aneurysm, or significant stenosis. These results were called by telephone at the time of interpretation on 04/17/2018 at 4:24 am to Dr. Sherrie George , who verbally acknowledged these results. Electronically Signed   By: Kristine Garbe M.D.   On: 04/17/2018 04:26   US Carotid Bilateral  (at [facility] Only)  Result Date: 04/17/2018 CLINICAL DATA:  Stroke. History of hypertension hyperlipidemia. Former smoker EXAM: BILATERAL CAROTID DUPLEX ULTRASOUND TECHNIQUE: Pearline Cables scale imaging, color Doppler and duplex ultrasound were performed of bilateral carotid and vertebral arteries in the neck. COMPARISON:  None. FINDINGS: Criteria: Quantification of carotid stenosis is based on velocity parameters that correlate the residual internal carotid diameter with NASCET-based stenosis levels, using the diameter of the distal internal carotid lumen as the denominator for stenosis measurement. The following velocity measurements were obtained: RIGHT ICA:  71/16 cm/sec CCA:  85/02 cm/sec SYSTOLIC ICA/CCA RATIO:  1.2 ECA:  57 cm/sec LEFT ICA:  65/9 cm/sec CCA:  77/41 cm/sec SYSTOLIC ICA/CCA RATIO:  1.0 ECA:  50 cm/sec RIGHT CAROTID ARTERY: There is a minimal amount mixed echogenic plaque with the mid (image 8) and distal (image 13) aspects of the right common carotid artery. There is a moderate amount of eccentric echogenic plaque within the right carotid bulb (images 16 and 18), extending to involve the origin and proximal aspects the right internal carotid artery (image 26), not resulting in elevated peak systolic velocities  within the interrogated course of the right internal carotid artery to suggest a hemodynamically significant stenosis. RIGHT VERTEBRAL ARTERY:  Antegrade flow LEFT CAROTID ARTERY: There is a moderate amount of echogenic shadowing plaque within the mid aspect of the left common carotid artery (image 44). There is a moderate amount of eccentric echogenic densely shadowing plaque within the left carotid bulb (images 51 and 53), extending to involve the origin and proximal aspects of the left internal carotid artery (image 62), not resulting in elevated peak systolic velocities within the interrogated course of the left internal carotid artery to suggest a hemodynamically significant stenosis.  LEFT VERTEBRAL ARTERY:  Antegrade flow IMPRESSION: Moderate amount of bilateral atherosclerotic plaque, right subjectively greater than left, not resulting in a hemodynamically significant stenosis within either internal carotid artery. Electronically Signed   By: Sandi Mariscal M.D.   On: 04/17/2018 12:09    Assessment: 77 y.o. female with a history of atrial fibrillation on no antiplatelet or anticoagulant who presents with right sided weakness and jerking.  Jerking difficult to characterize and mat represent movement disorder related to stroke or simple partial seizures.  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  Likely embolic in etiology.  MRA of head and neck unremarkable.  Echocardiogram is pending.  A1c pending.  LDL 64, patient on statin.  BP elevated on presentation.   Patient quite uncomfortable due to jerking and spasms,  Ativan appears to have worked acutely in the ED.   Will use prn until a more long term solution can be found.    Stroke Risk Factors - atrial fibrillation, hyperlipidemia and hypertension  Plan: 1. PT consult, OT consult, Speech consult 2. Echocardiogram pending 3. Prophylactic therapy-Eliquis 4. NPO until RN stroke swallow screen 5. Telemetry monitoring 6. Frequent neuro checks 7. D/C Flexeril 8. Baclofen 10mg  BID 9. Ativan 2mg  IV prn jerking-first dose now 10. Keppra 1000mg  IV now with maintenance of 500mg  BID 11. Follow up MRI to be performed on an outpatient basis after discharge.     Alexis Goodell, MD Neurology [phone] 04/17/2018, 4:58 PM

## 2018-04-17 NOTE — Progress Notes (Signed)
eeg completed ° °

## 2018-04-17 NOTE — Procedures (Signed)
ELECTROENCEPHALOGRAM REPORT   Patient: Isabel Hernandez       Room #: 128A-AA EEG No. ID: [id number] Age: 77 y.o.        Sex: female Referring Physician: Bridgett Larsson Report Date:  04/17/2018        Interpreting Physician: Alexis Goodell  History: Isabel Hernandez is an 77 y.o. female with right sided weakness and jerking activity  Medications:  Lipitor, ASA, Keppra, Ativan  Conditions of Recording:  This is a 16 channel EEG carried out with the patient in the drowsy and asleep states, s/p Ativan admimnistration.  Description:  The patient is in stage II sleep throughout the majority of the recording.  The background activity is of low voltage, slow and poorly organized.  There is superimposed intermittent symmetrical sleep spindles and vertex central sharp transients.  The patient is alerted during the recording but does not achieve full wakefulness.  The patient is alerted to drowse when the background activity is noted to be slow with irregular, low voltage theta and beta activity.   No epileptiform activity is noted.    Hyperventilation and intermittent photic stimulation were not performed.   IMPRESSION: This is a normal drowsy and asleep electroencephalogram.  No epileptiform activity is noted.     Alexis Goodell, MD Neurology [phone] 04/17/2018, 11:32 PM

## 2018-04-17 NOTE — Progress Notes (Signed)
OT Cancellation Note  Patient Details Name: Isabel Hernandez MRN: [id number] DOB: [birth year]   Cancelled Treatment:    Reason Eval/Treat Not Completed: Patient at procedure or test/ unavailable. Order received, chart reviewed. Pt out of room for testing. Will re-attempt OT evaluation at later date/time as pt is available and medically appropriate.  Jeni Salles, MPH, MS, OTR/L ascom [phone] 04/17/18, 10:58 AM

## 2018-04-17 NOTE — Progress Notes (Signed)
Advanced Care Plan.  Purpose of Encounter: Code status. Parties in Attendance: the patient, RN and me. Patient's Decisional Capacity: Yes. Medical Story: Isabel Hernandez  is a 77 y.o. female with a known history of A. fib, hypertension, hyperlipidemia, carotid artery occlusion, CAD, diverticulosis, UTI etc.  The patient presents to the ED with tremors started approximately 30 minutes prior to arrival.  She also complains of heaviness on the right leg, tingling and numbness in her hands and feet.  The patient denies any similar symptoms before.  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  I discussed with patient about her current condition, prognosis and CODE STATUS.  The patient clearly stated that she want to be resuscitated and intubated, she wants full code.  Plan:  Code Status: Full code. Time spent discussing advance care planning: 17 minutes.

## 2018-04-18 LAB — HEMOGLOBIN A1C
Hgb A1c MFr Bld: 5.1 % (ref 4.8–5.6)
Hgb A1c MFr Bld: 5.1 % (ref 4.8–5.6)
Mean Plasma Glucose: 100 mg/dL
Mean Plasma Glucose: 99.67 mg/dL

## 2018-04-18 LAB — LIPID PANEL
CHOLESTEROL: 104 mg/dL (ref 0–200)
HDL: 15 mg/dL — AB (ref >40)
LDL CALC: 60 mg/dL (ref 0–99)
Total CHOL/HDL Ratio: 6.9 RATIO
Triglycerides: 144 mg/dL (ref <150)
VLDL: 29 mg/dL (ref 0–40)

## 2018-04-18 MED ORDER — APIXABAN 5 MG PO TABS
5.0000 mg | ORAL_TABLET | Freq: Two times a day (BID) | ORAL | Status: DC
  Administered 2018-04-18 – 2018-04-19 (×2): 5 mg via ORAL
  Filled 2018-04-18 (×2): qty 1

## 2018-04-18 MED ORDER — BACLOFEN 10 MG PO TABS
20.0000 mg | ORAL_TABLET | Freq: Two times a day (BID) | ORAL | Status: DC
  Administered 2018-04-18 – 2018-04-19 (×2): 20 mg via ORAL
  Filled 2018-04-18 (×3): qty 2

## 2018-04-18 MED ORDER — APIXABAN 5 MG PO TABS: 5.0000 mg | ORAL_TABLET | Freq: Two times a day (BID) | ORAL | Status: DC

## 2018-04-18 MED ORDER — DOCUSATE SODIUM 100 MG PO CAPS
100.0000 mg | ORAL_CAPSULE | Freq: Two times a day (BID) | ORAL | Status: DC
  Administered 2018-04-18 – 2018-04-19 (×3): 100 mg via ORAL
  Filled 2018-04-18 (×3): qty 1

## 2018-04-18 MED ORDER — BISACODYL 5 MG PO TBEC: 5.0000 mg | DELAYED_RELEASE_TABLET | Freq: Every day | ORAL | Status: DC | PRN

## 2018-04-18 MED ORDER — ORAL CARE MOUTH RINSE
15.0000 mL | Freq: Two times a day (BID) | OROMUCOSAL | Status: DC
  Administered 2018-04-18 (×2): 15 mL via OROMUCOSAL

## 2018-04-18 NOTE — Progress Notes (Signed)
Subjective: Patient continues to have jerking movements on the left but less so than yesterday.  Spasms improved as well.    Objective: Current vital signs: BP (!) 145/77 (BP Location: Right Arm)   Pulse 84   Temp 97.7 F (36.5 C) (Oral)   Resp 18   Ht 5\' 4"  (1.626 m)   Wt 73.5 kg (162 lb)   SpO2 95%   BMI 27.81 kg/m  Vital signs in last 24 hours: Temp:  [97.4 F (36.3 C)-97.9 F (36.6 C)] 97.7 F (36.5 C) (07/17 1023) Pulse Rate:  [78-94] 84 (07/17 1023) Resp:  [16-18] 18 (07/17 1023) BP: (130-161)/(63-78) 145/77 (07/17 1023) SpO2:  [91 %-100 %] 95 % (07/17 1025)  Intake/Output from previous day: 07/16 0701 - 07/17 0700 In: 1346.3 [I.V.:1346.3] Out: 550 [Urine:550] Intake/Output this shift: Total I/O In: -  Out: 75 [Urine:75] Nutritional status:  Diet Order           Diet Heart Room service appropriate? Yes; Fluid consistency: Thin  Diet effective now          Neurologic Exam: Mental Status: Alert, oriented, thought content appropriate.  Speech fluent without evidence of aphasia.  Able to follow 3 step commands without difficulty. Cranial Nerves: II: Discs flat bilaterally; Visual fields grossly normal, pupils equal, round, reactive to light and accommodation III,IV, VI: ptosis not present, extra-ocular motions intact bilaterally V,VII: smile symmetric, facial light touch sensation normal bilaterally VIII: hearing normal bilaterally IX,X: gag reflex present XI: bilateral shoulder shrug XII: midline tongue extension Motor: Right :  Upper extremity   5-/5                                     Left:     Upper extremity   5/5             Lower extremity   3-4/5                                                           Lower extremity   5/5 Intermittent jerky movements noted in both the upper and lower right extremities.   Sensory: Pinprick and light touch intact throughout, bilaterally Deep Tendon Reflexes: 2+ and symmetric throughout Plantars: Right: upgoing                          Left: downgoing   Lab Results: Basic Metabolic Panel: Recent Labs  Lab 04/16/18 1932 04/17/18 0740  NA 138  --   K 3.5  --   CL 105  --   CO2 22  --   GLUCOSE 169*  --   BUN 18  --   CREATININE 1.00 0.82  CALCIUM 9.3  --     Liver Function Tests: Recent Labs  Lab 04/16/18 1932  AST 41  ALT 21  ALKPHOS 46  BILITOT 1.2  PROT 6.5  ALBUMIN 4.3   No results for input(s): LIPASE, AMYLASE in the last 168 hours. No results for input(s): AMMONIA in the last 168 hours.  CBC: Recent Labs  Lab 04/16/18 1932 04/17/18 0908  WBC 5.9 5.4  NEUTROABS 2.4  --   HGB 12.6 12.1  HCT 35.5 34.8*  MCV 99.8 100.0  PLT 127* 113*    Cardiac Enzymes: Recent Labs  Lab 04/16/18 1932  TROPONINI <0.03    Lipid Panel: Recent Labs  Lab 04/17/18 0740 04/18/18 0413  CHOL 108 104  TRIG 133 144  HDL 17* 15*  CHOLHDL 6.4 6.9  VLDL 27 29  LDLCALC 64 60    CBG: No results for input(s): GLUCAP in the last 168 hours.  Microbiology: No results found for this or any previous visit.  Coagulation Studies: No results for input(s): LABPROT, INR in the last 72 hours.  Imaging: Ct Head Wo Contrast  Result Date: 04/16/2018 CLINICAL DATA:  Focal neurologic deficit, heaviness in the right leg. EXAM: CT HEAD WITHOUT CONTRAST TECHNIQUE: Contiguous axial images were obtained from the base of the skull through the vertex without intravenous contrast. COMPARISON:  None. FINDINGS: Brain: Faint calcifications in the globus pallidus nucleus, likely physiologic. Periventricular white matter and corona radiata hypodensities favor chronic ischemic microvascular white matter disease. Otherwise, the brainstem, cerebellum, cerebral peduncles, thalami, basal ganglia, basilar cisterns, and ventricular system appear within normal limits. No intracranial hemorrhage, mass lesion, or acute CVA. Vascular: There is atherosclerotic calcification of the cavernous carotid arteries bilaterally.  Skull: Unremarkable Sinuses/Orbits: Unremarkable Other: No supplemental non-categorized findings. IMPRESSION: 1. No acute intracranial findings. 2. Periventricular white matter and corona radiata hypodensities favor chronic ischemic microvascular white matter disease. 3. There is atherosclerotic calcification of the cavernous carotid arteries bilaterally. Electronically Signed   By: Van Clines M.D.   On: 04/16/2018 20:15   Mr Jodene Nam Head Wo Contrast  Result Date: 04/17/2018 CLINICAL DATA:  77 y/o  F; heaviness in the right leg. EXAM: MRI HEAD WITHOUT AND WITH CONTRAST MRA HEAD WITHOUT CONTRAST MRA NECK WITHOUT AND WITH CONTRAST TECHNIQUE: Multiplanar, multiecho pulse sequences of the brain and surrounding structures were obtained without and with intravenous contrast. Angiographic images of the head were obtained using MRA technique without contrast. Angiographic images of the neck were obtained using MRA technique without and with intravenous contrast. CONTRAST:  44mL MULTIHANCE GADOBENATE DIMEGLUMINE 529 MG/ML IV SOLN COMPARISON:  04/16/2018 CT head. FINDINGS: MRI HEAD FINDINGS Brain: Area of reduced diffusion spanning up to 2 cm in the left paramedian posterior frontal lobe involving precentral gyrus (series 100, image 44) compatible with acute infarction. In the adjacent left posterior frontal lobe periventricular white matter there is a focus of T2 FLAIR hyperintense signal abnormality with increased diffusion and irregular enhancement (series 14, image 36, series 8, image 15, and series 30, image 9). The largest focus of enhancement measures 9 mm. No susceptibility hypointensity of the brain parenchyma to indicate hemorrhage. 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 No acute extra-axial collection, hydrocephalus, or herniation. Vascular: As below. Skull and upper cervical spine: Normal marrow signal.  Sinuses/Orbits: Negative. Other: None. MRA HEAD FINDINGS Anterior circulation: No large vessel occlusion, aneurysm, or significant stenosis is identified. Posterior circulation: No large vessel occlusion, aneurysm, or significant stenosis is identified. Anatomic variation: None significant. MRI NECK FINDINGS Aortic arch: Patent. Right common carotid artery: Patent. Right internal carotid artery: Patent. Motion artifact in the region of the carotid bifurcation. Right vertebral artery: Patent. Left common carotid artery: Patent. Left Internal carotid artery: Patent. Left Vertebral artery: Patent. There is no evidence of hemodynamically significant stenosis by NASCET criteria, occlusion, or aneurysm. IMPRESSION: 1. 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 No associated hemorrhage or mass  effect. 2. Irregularly enhancing T2 FLAIR hyperintense lesion with increased diffusion in left posterior frontal periventricular white matter adjacent to the acute infarct. This probably represents a subacute infarct. Acute inflammatory or demyelinating lesion as well as neoplasm could have this appearance but are considered less likely. Short interval follow-up (4-6 weeks) is recommended to assess interval change. 3. Patent carotid and vertebral arteries. No dissection, aneurysm, or hemodynamically significant stenosis utilizing NASCET criteria. 4. Patent anterior and posterior intracranial circulation. No large vessel occlusion, aneurysm, or significant stenosis. These results were called by telephone at the time of interpretation on 04/17/2018 at 4:24 am to Dr. Sherrie George , who verbally acknowledged these results. Electronically Signed   By: Kristine Garbe M.D.   On: 04/17/2018 04:26   Mr Jodene Nam Neck W Wo Contrast  Result Date: 04/17/2018 CLINICAL DATA:  77 y/o  F; heaviness in the right leg. EXAM: MRI HEAD WITHOUT AND WITH CONTRAST MRA HEAD WITHOUT CONTRAST MRA NECK  WITHOUT AND WITH CONTRAST TECHNIQUE: Multiplanar, multiecho pulse sequences of the brain and surrounding structures were obtained without and with intravenous contrast. Angiographic images of the head were obtained using MRA technique without contrast. Angiographic images of the neck were obtained using MRA technique without and with intravenous contrast. CONTRAST:  75mL MULTIHANCE GADOBENATE DIMEGLUMINE 529 MG/ML IV SOLN COMPARISON:  04/16/2018 CT head. FINDINGS: MRI HEAD FINDINGS Brain: Area of reduced diffusion spanning up to 2 cm in the left paramedian posterior frontal lobe involving precentral gyrus (series 100, image 44) compatible with acute infarction. In the adjacent left posterior frontal lobe periventricular white matter there is a focus of T2 FLAIR hyperintense signal abnormality with increased diffusion and irregular enhancement (series 14, image 36, series 8, image 15, and series 30, image 9). The largest focus of enhancement measures 9 mm. No susceptibility hypointensity of the brain parenchyma to indicate hemorrhage. 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 No acute extra-axial collection, hydrocephalus, or herniation. Vascular: As below. Skull and upper cervical spine: Normal marrow signal. Sinuses/Orbits: Negative. Other: None. MRA HEAD FINDINGS Anterior circulation: No large vessel occlusion, aneurysm, or significant stenosis is identified. Posterior circulation: No large vessel occlusion, aneurysm, or significant stenosis is identified. Anatomic variation: None significant. MRI NECK FINDINGS Aortic arch: Patent. Right common carotid artery: Patent. Right internal carotid artery: Patent. Motion artifact in the region of the carotid bifurcation. Right vertebral artery: Patent. Left common carotid artery: Patent. Left Internal carotid artery: Patent. Left Vertebral artery: Patent. There is no evidence of  hemodynamically significant stenosis by NASCET criteria, occlusion, or aneurysm. IMPRESSION: 1. 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 No associated hemorrhage or mass effect. 2. Irregularly enhancing T2 FLAIR hyperintense lesion with increased diffusion in left posterior frontal periventricular white matter adjacent to the acute infarct. This probably represents a subacute infarct. Acute inflammatory or demyelinating lesion as well as neoplasm could have this appearance but are considered less likely. Short interval follow-up (4-6 weeks) is recommended to assess interval change. 3. Patent carotid and vertebral arteries. No dissection, aneurysm, or hemodynamically significant stenosis utilizing NASCET criteria. 4. Patent anterior and posterior intracranial circulation. No large vessel occlusion, aneurysm, or significant stenosis. These results were called by telephone at the time of interpretation on 04/17/2018 at 4:24 am to Dr. Sherrie George , who verbally acknowledged these results. Electronically Signed   By: Kristine Garbe M.D.   On: 04/17/2018 04:26  Mr Jeri Cos And Wo Contrast  Result Date: 04/17/2018 CLINICAL DATA:  77 y/o  F; heaviness in the right leg. EXAM: MRI HEAD WITHOUT AND WITH CONTRAST MRA HEAD WITHOUT CONTRAST MRA NECK WITHOUT AND WITH CONTRAST TECHNIQUE: Multiplanar, multiecho pulse sequences of the brain and surrounding structures were obtained without and with intravenous contrast. Angiographic images of the head were obtained using MRA technique without contrast. Angiographic images of the neck were obtained using MRA technique without and with intravenous contrast. CONTRAST:  67mL MULTIHANCE GADOBENATE DIMEGLUMINE 529 MG/ML IV SOLN COMPARISON:  04/16/2018 CT head. FINDINGS: MRI HEAD FINDINGS Brain: Area of reduced diffusion spanning up to 2 cm in the left paramedian posterior frontal lobe involving precentral gyrus (series 100,  image 44) compatible with acute infarction. In the adjacent left posterior frontal lobe periventricular white matter there is a focus of T2 FLAIR hyperintense signal abnormality with increased diffusion and irregular enhancement (series 14, image 36, series 8, image 15, and series 30, image 9). The largest focus of enhancement measures 9 mm. No susceptibility hypointensity of the brain parenchyma to indicate hemorrhage. 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 No acute extra-axial collection, hydrocephalus, or herniation. Vascular: As below. Skull and upper cervical spine: Normal marrow signal. Sinuses/Orbits: Negative. Other: None. MRA HEAD FINDINGS Anterior circulation: No large vessel occlusion, aneurysm, or significant stenosis is identified. Posterior circulation: No large vessel occlusion, aneurysm, or significant stenosis is identified. Anatomic variation: None significant. MRI NECK FINDINGS Aortic arch: Patent. Right common carotid artery: Patent. Right internal carotid artery: Patent. Motion artifact in the region of the carotid bifurcation. Right vertebral artery: Patent. Left common carotid artery: Patent. Left Internal carotid artery: Patent. Left Vertebral artery: Patent. There is no evidence of hemodynamically significant stenosis by NASCET criteria, occlusion, or aneurysm. IMPRESSION: 1. 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 No associated hemorrhage or mass effect. 2. Irregularly enhancing T2 FLAIR hyperintense lesion with increased diffusion in left posterior frontal periventricular white matter adjacent to the acute infarct. This probably represents a subacute infarct. Acute inflammatory or demyelinating lesion as well as neoplasm could have this appearance but are considered less likely. Short interval follow-up (4-6 weeks) is recommended to assess  interval change. 3. Patent carotid and vertebral arteries. No dissection, aneurysm, or hemodynamically significant stenosis utilizing NASCET criteria. 4. Patent anterior and posterior intracranial circulation. No large vessel occlusion, aneurysm, or significant stenosis. These results were called by telephone at the time of interpretation on 04/17/2018 at 4:24 am to Dr. Sherrie George , who verbally acknowledged these results. Electronically Signed   By: Kristine Garbe M.D.   On: 04/17/2018 04:26   US Carotid Bilateral (at [facility] And [facility] Only)  Result Date: 04/17/2018 CLINICAL DATA:  Stroke. History of hypertension hyperlipidemia. Former smoker EXAM: BILATERAL CAROTID DUPLEX ULTRASOUND TECHNIQUE: Pearline Cables scale imaging, color Doppler and duplex ultrasound were performed of bilateral carotid and vertebral arteries in the neck. COMPARISON:  None. FINDINGS: Criteria: Quantification of carotid stenosis is based on velocity parameters that correlate the residual internal carotid diameter with NASCET-based stenosis levels, using the diameter of the distal internal carotid lumen as the denominator for stenosis measurement. The following velocity measurements were obtained: RIGHT ICA:  71/16 cm/sec CCA:  62/13 cm/sec SYSTOLIC ICA/CCA RATIO:  1.2 ECA:  57 cm/sec LEFT ICA:  65/9 cm/sec CCA:  08/65 cm/sec SYSTOLIC ICA/CCA RATIO:  1.0 ECA:  50 cm/sec RIGHT CAROTID  ARTERY: There is a minimal amount mixed echogenic plaque with the mid (image 8) and distal (image 13) aspects of the right common carotid artery. There is a moderate amount of eccentric echogenic plaque within the right carotid bulb (images 16 and 18), extending to involve the origin and proximal aspects the right internal carotid artery (image 26), not resulting in elevated peak systolic velocities within the interrogated course of the right internal carotid artery to suggest a hemodynamically significant stenosis. RIGHT VERTEBRAL ARTERY:  Antegrade flow LEFT  CAROTID ARTERY: There is a moderate amount of echogenic shadowing plaque within the mid aspect of the left common carotid artery (image 44). There is a moderate amount of eccentric echogenic densely shadowing plaque within the left carotid bulb (images 51 and 53), extending to involve the origin and proximal aspects of the left internal carotid artery (image 62), not resulting in elevated peak systolic velocities within the interrogated course of the left internal carotid artery to suggest a hemodynamically significant stenosis. LEFT VERTEBRAL ARTERY:  Antegrade flow IMPRESSION: Moderate amount of bilateral atherosclerotic plaque, right subjectively greater than left, not resulting in a hemodynamically significant stenosis within either internal carotid artery. Electronically Signed   By: Sandi Mariscal M.D.   On: 04/17/2018 12:09    Medications:  I have reviewed the patient's current medications. Scheduled: . apixaban  5 mg Oral BID  . atorvastatin  40 mg Oral q1800  . baclofen  20 mg Oral BID  . docusate sodium  100 mg Oral BID  . levETIRAcetam  500 mg Oral BID  . mouth rinse  15 mL Mouth Rinse BID    Assessment/Plan: Jerking continues although improved.  EEG shows no epileptiform activity.  Patient started on Eliquis. A1c 5.1.  Echocardiogram was technically difficult but shows an EF of 65-70%.  Recommendations: 1.  Increase Baclofen to 20mg  BID 2.  Continue Eliquis 3.  Continue therapy   LOS: 1 day   Alexis Goodell, MD Neurology [phone] 04/18/2018  1:32 PM

## 2018-04-18 NOTE — Evaluation (Signed)
Occupational Therapy Evaluation Patient Details Name: Isabel Hernandez MRN: [id number] DOB: 1941-01-02 Today's Date: 04/18/2018    History of Present Illness 77 y.o. female with a known history of A. fib, hypertension, hyperlipidemia, carotid artery occlusion, CAD, diverticulosis, UTI.  She arrived morning of eval with tremor, heaviness on the right leg (and UE), tingling and numbness in her hands and feet.  The patient denies any similar symptoms before.  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 Prior to hospital admission, pt was completely independent and active. She lives with her husband and 2 dogs. Pt denies falls history in past 12 months, however endorses mild balance deficits and occasionally holds onto furniture/walls for stability. Pt currently demonstrates deficits in strength and coordination in RUE and RLE, tremors R>L side t/o session, pain on R side (aside from chronic R hip pain), and mild slurred speech and expressive difficulties this date (difficulty with correctly speaking words/phrases presented to her, one time states she sees double of a word, mild word approximation). Pt denies visual deficits other than difficulty with accomodation. Pt required min assist for bed mobility this date but became slightly lightheaded and requested to lie back down prior to further mobility attempts. Pt endorses being very anxious, became tearful during session. Emotional support offered. Instructed pt/spouse in pursed lip breathing to support anxiety/stress/breathing. Pt verbalized understanding. Pt will benefit from skilled OT services to address noted impairments and functional deficits in all aspects of self care and mobility in order to maximize return to PLOF and minimize falls risk, functional decline, and increased caregiver burden. Recommend CIR at this time.      Follow Up  Recommendations  CIR    Equipment Recommendations  Other (comment)(TBD)    Recommendations for Other Services       Precautions / Restrictions Precautions Precautions: Fall Restrictions Weight Bearing Restrictions: No      Mobility Bed Mobility Overal bed mobility: Needs Assistance Bed Mobility: Supine to Sit;Sit to Supine   Sidelying to sit: Min assist Supine to sit: Min assist     General bed mobility comments: increased effort and time and min assist for RLE mgt and to shift trunk to improve stability EOB  Transfers                      Balance Overall balance assessment: Needs assistance Sitting-balance support: Feet supported;Single extremity supported Sitting balance-Leahy Scale: Fair                                     ADL either performed or assessed with clinical judgement   ADL Overall ADL's : Needs assistance/impaired Eating/Feeding: Sitting;Modified independent Eating/Feeding Details (indicate cue type and reason): using L non-dom hand Grooming: Sitting;Wash/dry face;Set up Grooming Details (indicate cue type and reason): primarily using L non-dom hand Upper Body Bathing: Sitting;Moderate assistance;Minimal assistance   Lower Body Bathing: Moderate assistance;Sitting/lateral leans   Upper Body Dressing : Sitting;Minimal assistance;Moderate assistance   Lower Body Dressing: Sitting/lateral leans;Moderate assistance                       Vision Baseline Vision/History: Wears glasses Wears Glasses: At all times Patient Visual Report: No change from baseline Vision Assessment?: Yes Eye Alignment: Within Functional Limits Ocular Range of  Motion: Within Functional Limits Alignment/Gaze Preference: Within Defined Limits Tracking/Visual Pursuits: Able to track stimulus in all quads without difficulty Visual Fields: No apparent deficits Additional Comments: pt notes increased difficulty with accommodation      Perception     Praxis      Pertinent Vitals/Pain Pain Assessment: 0-10 Pain Score: 2  Pain Location: RLE > RUE Pain Descriptors / Indicators: Aching;Spasm Pain Intervention(s): Limited activity within patient's tolerance;Monitored during session;Premedicated before session;Repositioned     Hand Dominance Right   Extremity/Trunk Assessment Upper Extremity Assessment Upper Extremity Assessment: RUE deficits/detail(LUE WFL, very mild tremors) RUE Deficits / Details: R UE with tremors t/o the session, grossly 3/5 to 3+/5, sensation intact, impaired coordination (tremors contibuting) RUE Coordination: decreased gross motor;decreased fine motor   Lower Extremity Assessment Lower Extremity Assessment: RLE deficits/detail(LLE WFL, very mild tremors) RLE Deficits / Details: hip 3-/5, knee 3/5, ankle in inversion/plantar flexed posture RLE Coordination: decreased gross motor;decreased fine motor   Cervical / Trunk Assessment Cervical / Trunk Assessment: Normal   Communication Communication Communication: No difficulties   Cognition Arousal/Alertness: Awake/alert Behavior During Therapy: Anxious Overall Cognitive Status: Within Functional Limits for tasks assessed                                     General Comments       Exercises Other Exercises Other Exercises: Pt endorses being very anxious, became tearful during session. Emotional support offered. Instructed pt/spouse in pursed lip breathing to support anxiety/stress/breathing. Pt verbalized understanding.   Shoulder Instructions      Home Living Family/patient expects to be discharged to:: Private residence Living Arrangements: Spouse/significant other Available Help at Discharge: Family;Available 24 hours/day Type of Home: Other(Comment)(cottage at [facility]) Home Access: Level entry     Home Layout: One level               Home Equipment: None(states she can easily borrow one from  neighbors)          Prior Functioning/Environment Level of Independence: Independent        Comments: Pt able to drive, run errands, some chronic R hip pain with prolonged ambulation        OT Problem List: Decreased strength;Decreased knowledge of use of DME or AE;Decreased coordination;Impaired UE functional use;Impaired balance (sitting and/or standing);Pain      OT Treatment/Interventions: Self-care/ADL training;Balance training;Therapeutic exercise;Therapeutic activities;Neuromuscular education;DME and/or AE instruction;Patient/family education;Visual/perceptual remediation/compensation;Cognitive remediation/compensation    OT Goals(Current goals can be found in the care plan section) Acute Rehab OT Goals Patient Stated Goal: get back to her normal active self OT Goal Formulation: With patient/family Time For Goal Achievement: 05/02/18 Potential to Achieve Goals: Good ADL Goals Pt Will Perform Eating: with modified independence;sitting(incorporating RUE, AE as needed) Pt Will Perform Grooming: sitting;with min guard assist Pt Will Perform Upper Body Dressing: with min guard assist;sitting Pt Will Perform Lower Body Dressing: sit to/from stand;with min assist Pt Will Transfer to Toilet: with min guard assist;ambulating;bedside commode  OT Frequency: Min 3X/week   Barriers to D/C:            Co-evaluation              AM-PAC PT "6 Clicks" Daily Activity     Outcome Measure Help from another person eating meals?: None Help from another person taking care of personal grooming?: None Help from another person toileting, which includes using toliet, bedpan, or urinal?:  A Lot Help from another person bathing (including washing, rinsing, drying)?: A Lot Help from another person to put on and taking off regular upper body clothing?: A Lot Help from another person to put on and taking off regular lower body clothing?: A Lot 6 Click Score: 16   End of Session     Activity Tolerance: Patient tolerated treatment well Patient left: in bed;with call bell/phone within reach;with bed alarm set;with family/visitor present  OT Visit Diagnosis: Other abnormalities of gait and mobility (R26.89);Hemiplegia and hemiparesis;Pain;Other symptoms and signs involving cognitive function Hemiplegia - Right/Left: Right Hemiplegia - dominant/non-dominant: Dominant Hemiplegia - caused by: Cerebral infarction Pain - Right/Left: Right Pain - part of body: Arm;Leg                Time: 1030-1107 OT Time Calculation (min): 37 min Charges:  OT General Charges $OT Visit: 1 Visit OT Evaluation $OT Eval Moderate Complexity: 1 Mod OT Treatments $Self Care/Home Management : 8-22 mins  Jeni Salles, MPH, MS, OTR/L ascom [phone] 04/18/18, 12:00 PM

## 2018-04-18 NOTE — Progress Notes (Signed)
Inpatient Rehabilitation Admissions Coordinator  I contacted patient by phone to clarify her preference for rehab venue once she had discussed with therapy, [facility] staff and husband. She prefers an inpt rehab admit at [facility] in [place]. I discussed that I will discuss with our Rehab MD and verify bed availability for the next 1 to 2 days for when pt felt to likely be medically ready for d/c.  Danne Baxter, RN, MSN Rehab Admissions Coordinator [phone] 04/18/2018 4:22 PM

## 2018-04-18 NOTE — Progress Notes (Signed)
Isabel Hernandez for Apixaban Indication: atrial fibrillation  Allergies  Allergen Reactions  . Nitroglycerin Other (See Comments)    Can only use the patch  Turned lobster red and felt faint with sublingual NTG  . Penicillins Hives    Hives Has patient had a PCN reaction causing immediate rash, facial/tongue/throat swelling, SOB or lightheadedness with hypotension: YES Has patient had a PCN reaction causing severe rash involving mucus membranes or skin necrosis: NO Has patient had a PCN reaction that required hospitalization NO Has patient had a PCN reaction occurring within the last 10 years: No If all of the above answers are "NO", then may proceed with Cephalosporin use.  . Biaxin [Clarithromycin] Rash    Rash   . Latex Rash  . Lidocaine Swelling    Eye swelling Tolerates Tetracaine with epidural steroid injections (04/10/17)  . Nickel Rash  . Oxycodone Hives  . Tetracyclines & Related Rash       . Tizanidine Rash    Patient Measurements: Height: 5\' 4"  (162.6 cm) Weight: 162 lb (73.5 kg) IBW/kg (Calculated) : 54.7   Vital Signs: Temp: 98 F (36.7 C) (07/17 1400) Temp Source: Oral (07/17 1400) BP: 140/79 (07/17 1400) Pulse Rate: 99 (07/17 1400)  Labs: Recent Labs    04/16/18 1932 04/17/18 0740 04/17/18 0908  HGB 12.6  --  12.1  HCT 35.5  --  34.8*  PLT 127*  --  113*  CREATININE 1.00 0.82  --   TROPONINI <0.03  --   --     Estimated Creatinine Clearance: 55.5 mL/min (by C-G formula based on SCr of 0.82 mg/dL).   Medical History: Past Medical History:  Diagnosis Date  . A-fib ([place])   . Arthritis   . Breast cancer ([place]) 2002   RT LUMPECTOMY  . Bronchitis   . Carotid artery narrowings   . 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 Carotid artery occlusion    RIGHT 50% BLOCKAGE  . Colon polyps   . Complication of anesthesia    shaking/ FOLLOWING LOCAL AT DENTIST , drop O2 sats TO 50% DURING COLONOSCOPY  . Coronary  artery disease   . Diverticulosis   . Dysrhythmia   . Environmental allergies   . GERD (gastroesophageal reflux disease)   . History of hiatal hernia   . Hyperlipidemia   . Hypertension   . IBS (irritable bowel syndrome)   . Mitral valve disorder   . Shortness of breath dyspnea   . Syncope and collapse   . UTI (lower urinary tract infection)     Medications:  Patient was on DVT proph LMWH and last dose was  0717 @ 1040.   Assessment: 77 yo female admitted with stroke with PMH of afib and no anticoagulation. Pharmacy has been consulted to dose Apixaban.    Plan:  Will start apixaban 5mg  BID. Pharmacy will continue to follow.   Lendon Ka, PharmD 04/18/2018,3:06 PM

## 2018-04-18 NOTE — Progress Notes (Signed)
Inpatient Rehabilitation Admissions Coordinator  I contacted pt by phone to begin discussions concerning her rehab venue options. Pt lives at the [facility] and stated she would either go to [place] or [facility] pending her further discussions with the team. I briefly reviewed the goals and expectations of an inpt rehab admit here at [facility] rehab at the [facility]. Patient requested I follow up with her tomorrow. Rehab venue pending bed availability and further medical work up.  Danne Baxter, RN, MSN Rehab Admissions Coordinator [phone] 04/18/2018 10:25 AM

## 2018-04-18 NOTE — Progress Notes (Signed)
Isabel Hernandez at [place] NAME: Isabel Hernandez    MR#:  [id number]  DATE OF BIRTH:  20-Aug-1941  SUBJECTIVE:  CHIEF COMPLAINT:   Chief Complaint  Patient presents with  . Tremors   Better right leg weakness and tremor. REVIEW OF SYSTEMS:  Review of Systems  Constitutional: Positive for malaise/fatigue. Negative for chills and fever.  HENT: Negative for sore throat.   Eyes: Negative for blurred vision and double vision.  Respiratory: Negative for cough, hemoptysis, shortness of breath, wheezing and stridor.   Cardiovascular: Negative for chest pain, palpitations, orthopnea and leg swelling.  Gastrointestinal: Negative for abdominal pain, blood in stool, diarrhea, melena, nausea and vomiting.  Genitourinary: Negative for dysuria, flank pain and hematuria.  Musculoskeletal: Negative for back pain and joint pain.  Skin: Negative for rash.  Neurological: Positive for tremors and focal weakness. Negative for dizziness, sensory change, seizures, loss of consciousness, weakness and headaches.  Endo/Heme/Allergies: Negative for polydipsia.  Psychiatric/Behavioral: Negative for depression. The patient is nervous/anxious.     DRUG ALLERGIES:   Allergies  Allergen Reactions  . Nitroglycerin Other (See Comments)    Can only use the patch  Turned lobster red and felt faint with sublingual NTG  . Penicillins Hives    Hives Has patient had a PCN reaction causing immediate rash, facial/tongue/throat swelling, SOB or lightheadedness with hypotension: YES Has patient had a PCN reaction causing severe rash involving mucus membranes or skin necrosis: NO Has patient had a PCN reaction that required hospitalization NO Has patient had a PCN reaction occurring within the last 10 years: No If all of the above answers are "NO", then may proceed with Cephalosporin use.  . Biaxin [Clarithromycin] Rash    Rash   . Latex Rash  . Lidocaine Swelling    Eye  swelling Tolerates Tetracaine with epidural steroid injections (04/10/17)  . Nickel Rash  . Oxycodone Hives  . Tetracyclines & Related Rash       . Tizanidine Rash   VITALS:  Blood pressure 140/79, pulse 99, temperature 98 F (36.7 C), temperature source Oral, resp. rate 18, height 5\' 4"  (1.626 m), weight 162 lb (73.5 kg), SpO2 95 %. PHYSICAL EXAMINATION:  Physical Exam  Constitutional: She is oriented to person, place, and time. She appears well-developed.  HENT:  Head: Normocephalic.  Mouth/Throat: Oropharynx is clear and moist.  Eyes: Pupils are equal, round, and reactive to light. Conjunctivae and EOM are normal. No scleral icterus.  Neck: Normal range of motion. Neck supple. No JVD present. No tracheal deviation present.  Cardiovascular: Normal rate, regular rhythm and normal heart sounds. Exam reveals no gallop.  No murmur heard. Pulmonary/Chest: Effort normal and breath sounds normal. No respiratory distress. She has no wheezes. She has no rales.  Abdominal: Soft. Bowel sounds are normal. She exhibits no distension. There is no tenderness. There is no rebound.  Musculoskeletal: She exhibits no edema or tenderness.  Neurological: She is alert and oriented to person, place, and time. No cranial nerve deficit.  Right leg weakness 2 out of 5.  Hand tremor  Skin: No rash noted. No erythema.  Vitals reviewed.  LABORATORY PANEL:  Female CBC Recent Labs  Lab 04/17/18 0908  WBC 5.4  HGB 12.1  HCT 34.8*  PLT 113*   ------------------------------------------------------------------------------------------------------------------ Chemistries  Recent Labs  Lab 04/16/18 1932 04/17/18 0740  NA 138  --   K 3.5  --   CL 105  --   CO2  22  --   GLUCOSE 169*  --   BUN 18  --   CREATININE 1.00 0.82  CALCIUM 9.3  --   AST 41  --   ALT 21  --   ALKPHOS 46  --   BILITOT 1.2  --    RADIOLOGY:  No results found. ASSESSMENT AND PLAN:  Isabel Hernandez  is a 77 y.o. female with a  known history of A. fib, hypertension, hyperlipidemia, carotid artery occlusion, CAD, diverticulosis, UTI etc.  The patient presents to the ED with tremors started approximately 30 minutes prior to arrival.  She also complains of heaviness on the right leg, tingling and numbness in her hands and feet.  The patient denies any similar symptoms before.  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  Acute CVA. The patient has been treated with aspirin 325 mg p.o. daily, Lipitor 40 mg p.o. at bedtime, neuro check,. Carotid duplex and echocardiograph are unremarkable.  PT and OT evaluation: CIR. EEG is unremarkable. Eliquis prophylaxis per Dr. Doy Mince.  Tremor and jerking.  Continue Keppra twice daily and ibuprofen bid, Ativan as needed per Dr. Doy Mince.  Hypertension.  Hold hypertension medication to allow permissive blood pressure due to acute stroke.  History of A. fib.    Changed to Eliquis per Dr. Doy Mince. Discussed with Dr. Doy Mince.  All the records are reviewed and case discussed with Care Management/Social Worker. Management plans discussed with the patient, family and they are in agreement.  CODE STATUS: Full Code  TOTAL TIME TAKING CARE OF THIS PATIENT: 33 minutes.   More than 50% of the time was spent in counseling/coordination of care: YES  POSSIBLE D/C IN 1-2 DAYS, DEPENDING ON CLINICAL CONDITION.   Demetrios Loll M.D on 04/18/2018 at 3:12 PM  Between 7am to 6pm - Pager - [phone]  After 6pm go to [email or web address] - [facility]

## 2018-04-18 NOTE — PMR Pre-admission (Signed)
[place] PMR Admission Coordinator Pre-Admission Assessment  Patient: Isabel Hernandez is an 77 y.o., female MRN: [id number] DOB: 06-13-1941 Height: 5\' 4"  (162.6 cm) Weight: 73.5 kg (162 lb)  Insurance Information HMO:     PPO:      PCP:      IPA:      80/20:      OTHER: no HMO PRIMARY: Medicare a and b      Policy#: [id number]      Subscriber: pt Benefits:  Phone #: online     Name: 04/18/2018 Eff. Date: a 11/03/2004 b 11/03/04     Deduct: $1364      Out of Pocket Max: none      Life Max: none CIR: 100%      SNF: 20 full days Outpatient: 80%     Co-Pay: 20% Home Health: 100%      Co-Pay: none DME: 80%     Co-Pay: 20% Providers: pt choice  SECONDARY: Roseanna Rainbow      Policy#: [id number]      Subscriber: pt  Medicaid Application Date:       Case Manager:  Disability Application Date:       Case Worker:   Emergency Contact Information Contact Information    Name Relation Home Work Mobile   Grasse,Philip Spouse [phone]     Claramae, Rigdon   [phone]   Kisa, Fujii Relative   [phone]      Current Medical History  Patient Admitting Diagnosis: left CVA  History of Present Illness: 77 year old female admitted to [facility] 04/17/18. History of atrial fibrillation on no antiplatelet or anticoagulation.History also of HTN, hyperlipidemia, carotid artery occlusion . CAD, diverticulosis and UTI. She reportedly was getting into her car  And her right leg became acutely heavy and began experiencing intermittent jerking of the right upper and lower extremity. RLE spasms also. Patient also reported a few weeks earlier she had experienced numbness in her fingers on both hands that had resolved.  MRI brain shows acute and subacute left posteromedial frontal infarcts. MRA of head and neck unremarkable.  A1c 5.4. LDL 64 patient on statin. BP elevated on presentation.   Carotid duplex and echo unremarkable. EEG also unremarkable. Placed on Eliquis by Neuro. Keppra twice daily and  ibuprofen bid, Ativan as needed per Dr. Doy Mince, Neuro. Hold HTN meds to allow permissive BP due to acute stroke.    Patient's medical record from [facility] has been reviewed by the rehabilitation admission coordinator and physician.  NIH Stroke scale: 7 Glascow Coma Scale: n/a  Past Medical History  Past Medical History:  Diagnosis Date  . A-fib ([place])   . Arthritis   . Breast cancer ([place]) 2002   RT LUMPECTOMY  . Bronchitis   . Carotid artery narrowings   . 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 Carotid artery occlusion    RIGHT 50% BLOCKAGE  . Colon polyps   . Complication of anesthesia    shaking/ FOLLOWING LOCAL AT DENTIST , drop O2 sats TO 50% DURING COLONOSCOPY  . Coronary artery disease   . Diverticulosis   . Dysrhythmia   . Environmental allergies   . GERD (gastroesophageal reflux disease)   . History of hiatal hernia   . Hyperlipidemia   . Hypertension   . IBS (irritable bowel syndrome)   . Mitral valve disorder   . Shortness of breath dyspnea   . Syncope and collapse   . UTI (lower urinary tract  infection)     Family History   family history includes Heart disease in her mother; Hyperlipidemia in her brother; Hypertension in her brother; Kidney disease in her unknown relative; Lung cancer in her father and sister; Obesity in her brother; Prostate cancer in her father.  Prior Rehab/Hospitalizations Has the patient had major surgery during 100 days prior to admission? No    Current Medications  see MAR  Patients Current Diet:  Regular diet with thin liquids  Precautions / Restrictions Precautions Precautions: Fall Restrictions Weight Bearing Restrictions: No   Has the patient had 2 or more falls or a fall with injury in the past year?No  Prior Activity Level Community (5-7x/wk): patient very active in her Senior community; cards, head of 3 committess; drove  Prior Functional Level Self Care: Did the patient need help bathing, dressing, using the  toilet or eating?  Independent  Indoor Mobility: Did the patient need assistance with walking from room to room (with or without device)? Independent  Stairs: Did the patient need assistance with internal or external stairs (with or without device)? Independent  Functional Cognition: Did the patient need help planning regular tasks such as shopping or remembering to take medications? Independent  Home Assistive Devices / Equipment Home Assistive Devices/Equipment: None Home Equipment: None  Prior Device Use: Indicate devices/aids used by the patient prior to current illness, exacerbation or injury? None of the above   Prior Functional Level Current Functional Level  Bed Mobility  Independent  Min assist(increased effort and time for RLE mgmt)   Transfers  Independent  Mod assist(tremors and inability to effectiviely take weight thru RLE)   Mobility - Walk/Wheelchair  Independent  Mod assist(small side shift with direct asisst of PT; min to mod with b) mod assist 5 feet and 15 feet hand held assist 2 person   Upper Body Dressing  Independent  Mod assist   Lower Body Dressing  Independent  Mod assist   Grooming  Independent  Min assist(set up)   Eating/Drinking  Independent  Mod Independent   Toilet Transfer  Independent  Mod assist   Bladder Continence   continent  continent   Bowel Management  continent  continent   Stair Climbing   n/a  Other   Communication  independent  independent   Memory  intact  intact   Cooking/Meal Prep  independent      Housework  independent    Money Management  independent    Driving   yes      Special needs/care consideration BiPAP/CPAP n/a CPM  N/a Continuous Drip IV  N/a Dialysis  N/a Life Vest n/a Oxygen  N/a Special Bed  N/a Trach Size  N/a Wound Vac n/a Skin intact                        Bowel mgmt continent no LBM documented Bladder mgmt: continent Diabetic mgmt Hgb A1c 5.4  Previous  Home Environment Living Arrangements: Spouse/significant other  Lives With: Spouse Available Help at Discharge: Family, Available 24 hours/day Type of Home: Other(Comment)(independent cottage at [employer] senior living facility) Home Layout: One level Home Access: Level entry Bathroom Accessibility: Yes How Accessible: Accessible via walker Home Care Services: No Additional Comments: patient is retired Cabin crew in [place], spouse worked for the [employer]  Discharge Living Setting Plans for Discharge Living Setting: Patient's home, Other (Comment)(ILF cottage at [employer]) Type of Home at Discharge: (cottage) Discharge Home Layout: One level Discharge Home Access: Level  entry Discharge Bathroom Accessibility: Yes How Accessible: Accessible via walker Does the patient have any problems obtaining your medications?: No  [place] ILF cottage  Social/Family/Support Systems Patient Roles: Spouse, Parent, Science writer Information: spouse, Philip Anticipated Caregiver: spouse Anticipated Ambulance person Information: see above Ability/Limitations of Caregiver: none Caregiver Availability: 24/7 Discharge Plan Discussed with Primary Caregiver: Yes Is Caregiver In Agreement with Plan?: Yes Does Caregiver/Family have Issues with Lodging/Transportation while Pt is in Rehab?: No  Goals/Additional Needs Patient/Family Goal for Rehab: superivison PT, supervision OT Expected length of stay: ELOS 10 to 14 days Pt/Family Agrees to Admission and willing to participate: Yes Program Orientation Provided & Reviewed with Pt/Caregiver Including Roles  & Responsibilities: Yes  Patient Condition: I have reviewed medical records from [facility], and spoke with patient by phone. Patient will benefit from ongoing coordinated care provided by an acute inpatient rehabilitation admission. She requires ongoing PT and OT, Rehab Nursing and Rehab physician care., She is currently overall min to mod assist with all adls.  We will admit to Acute Inpatient Rehabilitation today.  Preadmission Screen Completed By:  Cleatrice Burke, 04/19/2018 8:11 AM ______________________________________________________________________   Discussed status with Dr. Letta Pate  on  04/19/2018  at  [phone] and received telephone approval for admission today.  Admission Coordinator:  Cleatrice Burke, time  9983 Date  04/19/2018   Assessment/Plan: Diagnosis:Left frontal cerebral infarct 1. Does the need for close, 24 hr/day  Medical supervision in concert with the patient's rehab needs make it unreasonable for this patient to be served in a less intensive setting? Yes 2. Co-Morbidities requiring supervision/potential complications: HTN, [place] d/o 3. Due to bladder management, bowel management, safety, skin/wound care, disease management, medication administration, pain management and patient education, does the patient require 24 hr/day rehab nursing? Yes 4. Does the patient require coordinated care of a physician, rehab nurse, PT (1-2 hrs/day, 5 days/week), OT (1-2 hrs/day, 5 days/week) and SLP (.5-1 hrs/day, 5 days/week) to address physical and functional deficits in the context of the above medical diagnosis(es)? Yes Addressing deficits in the following areas: balance, endurance, locomotion, strength, transferring, bowel/bladder control, bathing, dressing, grooming, toileting, cognition and psychosocial support 5. Can the patient actively participate in an intensive therapy program of at least 3 hrs of therapy 5 days a week? Yes 6. The potential for patient to make measurable gains while on inpatient rehab is good 7. Anticipated functional outcomes upon discharge from inpatients are: supervision PT, supervision OT, supervision SLP 8. Estimated rehab length of stay to reach the above functional goals is: 7-10d 9. Does the patient have adequate social supports to accommodate these discharge functional goals? Yes 10. Anticipated  D/C setting: Home 11. Anticipated post D/C treatments: [place] therapy 12. Overall Rehab/Functional Prognosis: good    RECOMMENDATIONS: This patient's condition is appropriate for continued rehabilitative care in the following setting: CIR Patient has agreed to participate in recommended program. Yes Note that insurance prior authorization may be required for reimbursement for recommended care.  Comment: Charlett Blake M.D. [place] FAAPM&R (Sports Med, Neuromuscular Med) Diplomate Am Board of Electrodiagnostic Med  Cleatrice Burke 04/19/2018

## 2018-04-18 NOTE — Plan of Care (Signed)
NIH 7. No neuro changes noted during the shift. Pt has mild disarthia. Tremors and muscle spasms much improved, declined intervention. O2 sats drops to 91% while asleep, 2L O2 applied as needed to keep O2 sats >94%. Pt encouraged to cough and deep breathing.

## 2018-04-18 NOTE — Progress Notes (Signed)
Physical Therapy Treatment Patient Details Name: Isabel Hernandez MRN: [id number]   History of Present Illness 77 y.o. female with a known history of A. fib, hypertension, hyperlipidemia, carotid artery occlusion, CAD, diverticulosis, UTI.  She arrived morning of eval with tremor, heaviness on the right leg (and UE), tingling and numbness in her hands and feet.  The patient denies any similar symptoms before.  MRI of the brain show acute CVA, 2 cm acute infarction within the left posteromedial frontal lobe    PT Comments    Patient continues with intermittent jerking/spasms to R UE/LE, but improved from previous date.  Do appear to further subside with WBing efforts and distraction/relaxation.  Able to complete sit/stand and initiate short-distance gait training (up to 15' with bilat HHA), requiring constant hands-on cuing/facilitation for pelvic position, hip/knee/ankle control and midline orientation/weight shift.  Improving active control of R LE, but continues with limited R ankle DF. Exceptional motivation and participation with session.  Continue to recommend transition to acute inpatient rehab upon discharge for high-intensity, post-acute rehab services.    Follow Up Recommendations  CIR     Equipment Recommendations       Recommendations for Other Services       Precautions / Restrictions Precautions Precautions: Fall Restrictions Weight Bearing Restrictions: No    Mobility  Bed Mobility Overal bed mobility: Needs Assistance Bed Mobility: Supine to Sit     Supine to sit: Min assist     General bed mobility comments: encouraged for R UE reaching across body/midline to facilitate trunk flexion and initial rotation  Transfers Overall transfer level: Needs assistance Equipment used: 2 person hand held assist Transfers: Sit to/from Stand Sit to Stand: Mod assist         General transfer comment: min assist for R LE placement to promote symmetrical WBing; over-pressure  to R quads to facilitate contraction and closed-chain WBing/stability.  Able to position and maintain R ankle this date with all standing activities  Ambulation/Gait Ambulation/Gait assistance: Mod assist;+2 physical assistance Gait Distance (Feet): (5, 15) Assistive device: 2 person hand held assist       General Gait Details: mod manual cuing for L ant/lateral weight shift throughout gait cycle.  Able to indep advance R LE (inconsistent step height/length and foot placement), but requires min/mod assist with step by step verbal cuing for R TKE in loading phases of gait cycle.  Decreased R foot heel strike/toe off, limited anterior translation of tibia.     Stairs             Wheelchair Mobility    Modified Rankin (Stroke Patients Only)       Balance   Sitting-balance support: Feet supported;No upper extremity supported Sitting balance-Leahy Scale: Fair Sitting balance - Comments: cga/min assist; lists posteriorally with fatigue, divided attention, but does demonstrate awarenes sof altered midline and initiates correction indep   Standing balance support: Bilateral upper extremity supported Standing balance-Leahy Scale: Zero Standing balance comment: R posterior/lateral lean, constant mod assist 1-2 for correction                            Cognition Arousal/Alertness: Awake/alert Behavior During Therapy: WFL for tasks assessed/performed Overall Cognitive Status: Within Functional Limits for tasks assessed                                 General Comments: clear communication, easily  makes needs knows and demonstrates ability to participate with fluid conversation      Exercises Other Exercises Other Exercises: Sit/stand x5 with bilat HHA/arms draped over therapist shoulders, mod assist +2-emphasis on symmetrical LE foot placement (to maintain adequate demand to R LE) and improved trunk control, midline with movement transition Other  Exercises: Unsupported standing, mod assist +2-worked to improve awareness of midline in A/P and M/L planes.  With training, able to recognize altered midline and initiate correction, improving indep to min assist at times. Other Exercises: Progressed to include L LE forward/backward stepping, bilat HHA/mod assist +2, for standing balance.  Mod assist/manual cuing at pelvis, R lateral hip and R knee to promote stability and neutral alignment.  Improving awareness and control of R LE in closed-chain position.    General Comments        Pertinent Vitals/Pain Pain Assessment: Faces Faces Pain Scale: Hurts little more Pain Location: RLE > RUE Pain Descriptors / Indicators: Aching;Spasm Pain Intervention(s): Limited activity within patient's tolerance;Repositioned;Monitored during session    Home Living                      Prior Function            PT Goals (current goals can now be found in the care plan section) Acute Rehab PT Goals Patient Stated Goal: get back to her normal active self PT Goal Formulation: With patient Time For Goal Achievement: 05/01/18 Potential to Achieve Goals: Good Progress towards PT goals: Progressing toward goals    Frequency    7X/week      PT Plan Current plan remains appropriate    Co-evaluation              AM-PAC PT "6 Clicks" Daily Activity  Outcome Measure  Difficulty turning over in bed (including adjusting bedclothes, sheets and blankets)?: Unable Difficulty moving from lying on back to sitting on the side of the bed? : Unable   Help needed moving to and from a bed to chair (including a wheelchair)?: A Lot Help needed walking in hospital room?: A Lot Help needed climbing 3-5 steps with a railing? : A Lot 6 Click Score: 8    End of Session Equipment Utilized During Treatment: Gait belt Activity Tolerance: Patient tolerated treatment well Patient left: in chair;with chair alarm set;with call bell/phone within  reach Nurse Communication: Mobility status PT Visit Diagnosis: Muscle weakness (generalized) (M62.81);Difficulty in walking, not elsewhere classified (R26.2);Unsteadiness on feet (R26.81);Apraxia (R48.2);Hemiplegia and hemiparesis Hemiplegia - Right/Left: Right Hemiplegia - dominant/non-dominant: Dominant Hemiplegia - caused by: Cerebral infarction     Time: 1520-1600 PT Time Calculation (min) (ACUTE ONLY): 40 min  Charges:  $Gait Training: 8-22 mins $Neuromuscular Re-education: 23-37 mins                    G Codes:       Marine Lezotte H. Owens Shark, PT, DPT, NCS 04/18/18, 10:41 PM [phone]

## 2018-04-19 ENCOUNTER — Encounter (HOSPITAL_COMMUNITY): Payer: Self-pay

## 2018-04-19 ENCOUNTER — Other Ambulatory Visit: Payer: Self-pay

## 2018-04-19 ENCOUNTER — Inpatient Hospital Stay (HOSPITAL_COMMUNITY)
Admission: RE | Admit: 2018-04-19 | Discharge: 2018-04-28 | DRG: 057 | Disposition: A | Discharge status: Home / Self Care | Payer: Medicare Other | Source: Intra-hospital | Attending: Physical Medicine & Rehabilitation | Admitting: Physical Medicine & Rehabilitation

## 2018-04-19 DIAGNOSIS — I482 Chronic atrial fibrillation, unspecified: Secondary | ICD-10-CM | POA: Diagnosis present

## 2018-04-19 DIAGNOSIS — R251 Tremor, unspecified: Secondary | ICD-10-CM | POA: Diagnosis not present

## 2018-04-19 DIAGNOSIS — I69398 Other sequelae of cerebral infarction: Secondary | ICD-10-CM

## 2018-04-19 DIAGNOSIS — Z7901 Long term (current) use of anticoagulants: Secondary | ICD-10-CM

## 2018-04-19 DIAGNOSIS — E785 Hyperlipidemia, unspecified: Secondary | ICD-10-CM | POA: Diagnosis present

## 2018-04-19 DIAGNOSIS — D696 Thrombocytopenia, unspecified: Secondary | ICD-10-CM | POA: Diagnosis present

## 2018-04-19 DIAGNOSIS — M5410 Radiculopathy, site unspecified: Secondary | ICD-10-CM | POA: Diagnosis present

## 2018-04-19 DIAGNOSIS — I4891 Unspecified atrial fibrillation: Secondary | ICD-10-CM | POA: Diagnosis present

## 2018-04-19 DIAGNOSIS — D62 Acute posthemorrhagic anemia: Secondary | ICD-10-CM | POA: Diagnosis present

## 2018-04-19 DIAGNOSIS — I639 Cerebral infarction, unspecified: Secondary | ICD-10-CM | POA: Diagnosis not present

## 2018-04-19 DIAGNOSIS — I480 Paroxysmal atrial fibrillation: Secondary | ICD-10-CM | POA: Diagnosis present

## 2018-04-19 DIAGNOSIS — I69354 Hemiplegia and hemiparesis following cerebral infarction affecting left non-dominant side: Principal | ICD-10-CM

## 2018-04-19 DIAGNOSIS — I10 Essential (primary) hypertension: Secondary | ICD-10-CM | POA: Diagnosis present

## 2018-04-19 DIAGNOSIS — Z87891 Personal history of nicotine dependence: Secondary | ICD-10-CM

## 2018-04-19 DIAGNOSIS — I2510 Atherosclerotic heart disease of native coronary artery without angina pectoris: Secondary | ICD-10-CM | POA: Diagnosis present

## 2018-04-19 DIAGNOSIS — Z853 Personal history of malignant neoplasm of breast: Secondary | ICD-10-CM

## 2018-04-19 DIAGNOSIS — G8191 Hemiplegia, unspecified affecting right dominant side: Secondary | ICD-10-CM

## 2018-04-19 DIAGNOSIS — R269 Unspecified abnormalities of gait and mobility: Secondary | ICD-10-CM | POA: Diagnosis not present

## 2018-04-19 DIAGNOSIS — K219 Gastro-esophageal reflux disease without esophagitis: Secondary | ICD-10-CM | POA: Diagnosis present

## 2018-04-19 DIAGNOSIS — K589 Irritable bowel syndrome without diarrhea: Secondary | ICD-10-CM | POA: Diagnosis present

## 2018-04-19 LAB — GLUCOSE, CAPILLARY
Glucose-Capillary: 99 mg/dL (ref 70–99)
Glucose-Capillary: 105 mg/dL — ABNORMAL HIGH (ref 70–99)
Glucose-Capillary: 114 mg/dL — ABNORMAL HIGH (ref 70–99)

## 2018-04-19 MED ORDER — ACETAMINOPHEN 325 MG PO TABS
325.0000 mg | ORAL_TABLET | ORAL | Status: DC | PRN
  Administered 2018-04-20 – 2018-04-28 (×2): 650 mg via ORAL
  Filled 2018-04-19 (×2): qty 2

## 2018-04-19 MED ORDER — ATORVASTATIN CALCIUM 40 MG PO TABS
40.0000 mg | ORAL_TABLET | Freq: Every day | ORAL | Status: DC
  Administered 2018-04-19 – 2018-04-27 (×9): 40 mg via ORAL
  Filled 2018-04-19 (×9): qty 1

## 2018-04-19 MED ORDER — BACLOFEN 20 MG PO TABS
20.0000 mg | ORAL_TABLET | Freq: Two times a day (BID) | ORAL | Status: DC
  Administered 2018-04-19 – 2018-04-20 (×3): 20 mg via ORAL
  Filled 2018-04-19 (×4): qty 1

## 2018-04-19 MED ORDER — GUAIFENESIN-DM 100-10 MG/5ML PO SYRP: 5.0000 mL | ORAL_SOLUTION | Freq: Four times a day (QID) | ORAL | Status: DC | PRN

## 2018-04-19 MED ORDER — LEVETIRACETAM 500 MG PO TABS
500.0000 mg | ORAL_TABLET | Freq: Two times a day (BID) | ORAL | Status: DC
  Administered 2018-04-19 – 2018-04-28 (×18): 500 mg via ORAL
  Filled 2018-04-19 (×18): qty 1

## 2018-04-19 MED ORDER — SENNOSIDES-DOCUSATE SODIUM 8.6-50 MG PO TABS: 2.0000 | ORAL_TABLET | Freq: Every evening | ORAL | Status: DC | PRN

## 2018-04-19 MED ORDER — BISACODYL 10 MG RE SUPP: 10.0000 mg | Freq: Every day | RECTAL | Status: DC | PRN

## 2018-04-19 MED ORDER — ATORVASTATIN CALCIUM 40 MG PO TABS: 40.0000 mg | ORAL_TABLET | Freq: Every day | ORAL | Status: DC

## 2018-04-19 MED ORDER — DIPHENHYDRAMINE HCL 12.5 MG/5ML PO ELIX
12.5000 mg | ORAL_SOLUTION | Freq: Four times a day (QID) | ORAL | Status: DC | PRN
  Filled 2018-04-19: qty 10

## 2018-04-19 MED ORDER — SENNOSIDES-DOCUSATE SODIUM 8.6-50 MG PO TABS
2.0000 | ORAL_TABLET | Freq: Every day | ORAL | Status: DC
  Administered 2018-04-19: 2 via ORAL
  Filled 2018-04-19: qty 2

## 2018-04-19 MED ORDER — PROCHLORPERAZINE EDISYLATE 10 MG/2ML IJ SOLN: 5.0000 mg | Freq: Four times a day (QID) | INTRAMUSCULAR | Status: DC | PRN

## 2018-04-19 MED ORDER — LEVETIRACETAM 500 MG PO TABS: 500.0000 mg | ORAL_TABLET | Freq: Two times a day (BID) | ORAL | Status: DC

## 2018-04-19 MED ORDER — CLONAZEPAM 0.25 MG PO TBDP
0.2500 mg | ORAL_TABLET | Freq: Three times a day (TID) | ORAL | Status: DC | PRN
  Administered 2018-04-19 – 2018-04-20 (×2): 0.25 mg via ORAL
  Filled 2018-04-19 (×2): qty 1

## 2018-04-19 MED ORDER — TRAZODONE HCL 50 MG PO TABS: 25.0000 mg | ORAL_TABLET | Freq: Every evening | ORAL | Status: DC | PRN

## 2018-04-19 MED ORDER — ALUM & MAG HYDROXIDE-SIMETH 200-200-20 MG/5ML PO SUSP: 30.0000 mL | ORAL | Status: DC | PRN

## 2018-04-19 MED ORDER — FLEET ENEMA 7-19 GM/118ML RE ENEM: 1.0000 | ENEMA | Freq: Once | RECTAL | Status: DC | PRN

## 2018-04-19 MED ORDER — PROCHLORPERAZINE MALEATE 5 MG PO TABS: 5.0000 mg | ORAL_TABLET | Freq: Four times a day (QID) | ORAL | Status: DC | PRN

## 2018-04-19 MED ORDER — POLYETHYLENE GLYCOL 3350 17 G PO PACK
17.0000 g | PACK | Freq: Every day | ORAL | Status: DC | PRN
  Administered 2018-04-19: 17 g via ORAL
  Filled 2018-04-19: qty 1

## 2018-04-19 MED ORDER — PROCHLORPERAZINE 25 MG RE SUPP: 12.5000 mg | Freq: Four times a day (QID) | RECTAL | Status: DC | PRN

## 2018-04-19 MED ORDER — APIXABAN 5 MG PO TABS: 5.0000 mg | ORAL_TABLET | Freq: Two times a day (BID) | ORAL | Status: DC

## 2018-04-19 MED ORDER — DOCUSATE SODIUM 100 MG PO CAPS: 100.0000 mg | ORAL_CAPSULE | Freq: Two times a day (BID) | ORAL | Status: DC

## 2018-04-19 MED ORDER — APIXABAN 5 MG PO TABS
5.0000 mg | ORAL_TABLET | Freq: Two times a day (BID) | ORAL | Status: DC
  Administered 2018-04-19 – 2018-04-28 (×18): 5 mg via ORAL
  Filled 2018-04-19 (×19): qty 1

## 2018-04-19 MED ORDER — FAMOTIDINE 20 MG PO TABS
20.0000 mg | ORAL_TABLET | Freq: Two times a day (BID) | ORAL | Status: DC
Start: 2018-04-19 — End: 2018-04-28
  Administered 2018-04-19 – 2018-04-28 (×18): 20 mg via ORAL
  Filled 2018-04-19 (×19): qty 1

## 2018-04-19 NOTE — Progress Notes (Signed)
ANTICOAGULATION CONSULT NOTE - Initial Consult   Pharmacy Consult for apixaban Indication: atrial fibrillation   Allergies  Allergen Reactions  . Nitroglycerin Other (See Comments)    Can only use the patch  Turned lobster red and felt faint with sublingual NTG  . Penicillins Hives    Hives Has patient had a PCN reaction causing immediate rash, facial/tongue/throat swelling, SOB or lightheadedness with hypotension: YES Has patient had a PCN reaction causing severe rash involving mucus membranes or skin necrosis: NO Has patient had a PCN reaction that required hospitalization NO Has patient had a PCN reaction occurring within the last 10 years: No If all of the above answers are "NO", then may proceed with Cephalosporin use.  . Biaxin [Clarithromycin] Rash    Rash   . Latex Rash  . Lidocaine Swelling    Eye swelling Tolerates Tetracaine with epidural steroid injections (04/10/17)  . Nickel Rash  . Oxycodone Hives  . Tetracyclines & Related Rash       . Tizanidine Rash    Patient Measurements: Height: 5\' 4"  (162.6 cm) Weight: 162 lb 0.6 oz (73.5 kg) IBW/kg (Calculated) : 54.7  Vital Signs: Temp: 98.6 F (37 C) (07/18 1320) Temp Source: Oral (07/18 1320) BP: 142/77 (07/18 1320) Pulse Rate: 77 (07/18 1320)  Labs: Recent Labs    04/16/18 1932 04/17/18 0740 04/17/18 0908  HGB 12.6  --  12.1  HCT 35.5  --  34.8*  PLT 127*  --  113*  CREATININE 1.00 0.82  --   TROPONINI <0.03  --   --     Estimated Creatinine Clearance: 55.5 mL/min (by C-G formula based on SCr of 0.82 mg/dL).   Medical History: Past Medical History:  Diagnosis Date  . A-fib ([place])   . Arthritis   . Breast cancer ([place]) 2002   RT LUMPECTOMY  . Bronchitis   . Carotid artery narrowings   . 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 Carotid artery occlusion    RIGHT 50% BLOCKAGE  . Colon polyps   . Complication of anesthesia    shaking/ FOLLOWING LOCAL AT DENTIST , drop O2 sats TO 50% DURING  COLONOSCOPY  . Coronary artery disease   . Diverticulosis   . Dysrhythmia   . Environmental allergies   . GERD (gastroesophageal reflux disease)   . History of hiatal hernia   . Hyperlipidemia   . Hypertension   . IBS (irritable bowel syndrome)   . Mitral valve disorder   . Shortness of breath dyspnea   . Syncope and collapse   . UTI (lower urinary tract infection)    Assessment: 77 y.o. female with a history of A. fib, hypertension, hyperlipidemia, carotid artery occlusion, CAD admitted to CIR from [facility] following a CVA  . She was started on apixaban 5 mg BID at [facility] 7/17 @ 18:25. It is noted that prior to that the pt has no history of antiplatelet or anticoagulation therapy.   Goal of Therapy:  Therapeutic anticoagulation Monitor platelets by anticoagulation protocol: Yes  Plan:  Apixaban 5 mg BID. Watch for s/s bleeding.  Blenda Nicely, [place] Pharmacist [phone] Please check AMION for all [facility] Pharmacy numbers    04/19/2018,2:05 PM

## 2018-04-19 NOTE — Care Management (Signed)
Received telephone call from Danne Baxter, Inpatient Acute Rehab representative for [facility].  Will be able to accept Ms. Ducharme todasy. Will update Dr. Bridgett Larsson and Nurse Anne Fu RN MSN CCM Care Management [phone]

## 2018-04-19 NOTE — Care Management (Signed)
Room 4W26 Dr Danae Chen is the receiving physician. Report will be called per Nurse Caryl Pina [phone]. [facility] will be called by Nurse Caryl Pina. Nurse and Dr. Bridgett Larsson updated. Shelbie Ammons RN MSN CCM Care Management [phone]

## 2018-04-19 NOTE — Progress Notes (Deleted)
Physical Therapy Treatment Patient Details Name: Isabel Hernandez MRN: [id number] DOB: 1941/05/10 Today's Date: 04/19/2018    History of Present Illness 77 y.o. female with a known history of A. fib, hypertension, hyperlipidemia, carotid artery occlusion, CAD, diverticulosis, UTI.  She arrived morning of eval with tremor, heaviness on the right leg (and UE), tingling and numbness in her hands and feet.  The patient denies any similar symptoms before.  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 Pt showed decrease in R sided tremor and demonstrated increased coordination and strength. Pt was able to perform sit to stand again with good carryover of technique from previous therapy session. Pt required min assist from therapist and was able to achieve upright position with minimal UE push-off. Pt showed increased ability to shift weight to RLE and LLE. Pt showed more control of RLE, including improved ability to control knee position and activate quads to maintain standing balance. Therapy time was limited due to pt transfer to CIR being initiated during session. Pt continues to show great potential for improvement and transfer to CIR upon d/c is still recommended.    Follow Up Recommendations  CIR     Equipment Recommendations       Recommendations for Other Services       Precautions / Restrictions Precautions Precautions: Fall Restrictions Weight Bearing Restrictions: No    Mobility  Bed Mobility Overal bed mobility: Needs Assistance Bed Mobility: Supine to Sit     Supine to sit: Min assist     General bed mobility comments: Pt naturally reaches with RUE to therpaist hand for help rotating and flexing trunk  Transfers Overall transfer level: Needs assistance Equipment used: 2 person hand held assist Transfers: Sit to/from [employer]  Transfers Sit to Stand: Mod assist Stand pivot transfers: Mod assist;+2 physical assistance       General transfer comment: min assist for R LE placement to promote symmetrical WBing; over-pressure to R quads to facilitate contraction and closed-chain WBing/stability.  Min assist at pelvis faciliating weigh shift to R LE.   Ambulation/Gait                 Stairs             Wheelchair Mobility    Modified Rankin (Stroke Patients Only)       Balance Overall balance assessment: Needs assistance Sitting-balance support: Feet supported;No upper extremity supported Sitting balance-Leahy Scale: Fair Sitting balance - Comments: Pt can maintian upright sitting posture when allowed to have BUE on bed for support. Does tend to drift to R, but is able to readjust indep   Standing balance support: Single extremity supported Standing balance-Leahy Scale: Poor Standing balance comment: R posterior/lateral lean, constant mod assist 1-2 for correction                            Cognition Arousal/Alertness: Awake/alert Behavior During Therapy: WFL for tasks assessed/performed Overall Cognitive Status: Within Functional Limits for tasks assessed                                 General Comments: clear communication, easily makes needs knows and demonstrates ability to participate with fluid conversation and humor      Exercises Other  Exercises Other Exercises: Sit/stand x 3 with unilateral hand support and RLE knee blocked mod assist 2+; Standing balance actvitites: RUE WB through surface with LUE reaching initiaing LE weight shifting and balance    General Comments        Pertinent Vitals/Pain      Home Living                      Prior Function            PT Goals (current goals can now be found in the care plan section) Acute Rehab PT Goals Patient Stated Goal: get back to her normal active self PT Goal Formulation: With  patient Time For Goal Achievement: 05/01/18 Potential to Achieve Goals: Good    Frequency    7X/week      PT Plan Other (comment)(Pt has d/c to CIR.)    Co-evaluation              AM-PAC PT "6 Clicks" Daily Activity  Outcome Measure  Difficulty turning over in bed (including adjusting bedclothes, sheets and blankets)?: Unable Difficulty moving from lying on back to sitting on the side of the bed? : Unable Difficulty sitting down on and standing up from a chair with arms (e.g., wheelchair, bedside commode, etc,.)?: Unable Help needed moving to and from a bed to chair (including a wheelchair)?: A Lot Help needed walking in hospital room?: A Lot Help needed climbing 3-5 steps with a railing? : A Lot 6 Click Score: 9    End of Session Equipment Utilized During Treatment: Gait belt Activity Tolerance: Patient tolerated treatment well Patient left: Other (comment)(Pt was transfered to CIR at end of session)   PT Visit Diagnosis: Muscle weakness (generalized) (M62.81);Difficulty in walking, not elsewhere classified (R26.2);Unsteadiness on feet (R26.81);Apraxia (R48.2);Hemiplegia and hemiparesis Hemiplegia - Right/Left: Right Hemiplegia - dominant/non-dominant: Dominant Hemiplegia - caused by: Cerebral infarction     Time: 1100-1130 PT Time Calculation (min) (ACUTE ONLY): 30 min  Charges:                       G Codes:       Hortencia Conradi, SPT May 19, 2018,12:43 PM

## 2018-04-19 NOTE — Progress Notes (Signed)
[facility] PMR Admission Coordinator Pre-Admission Assessment  Patient: Isabel Hernandez is an 77 y.o., female MRN: [id number] DOB: Jun 08, 1941 Height:   Weight:    Insurance Information HMO:     PPO:      PCP:      IPA:      80/20:      OTHER: no HMO PRIMARY: Medicare a and b      Policy#: [id number]      Subscriber: pt Benefits:  Phone #: online     Name: 04/18/2018 Eff. Date: a 11/03/2004 b 11/03/04     Deduct: $1364      Out of Pocket Max: none      Life Max: none CIR: 100%      SNF: 20 full days Outpatient: 80%     Co-Pay: 20% Home Health: 100%      Co-Pay: none DME: 80%     Co-Pay: 20% Providers: pt choice  SECONDARY: Roseanna Rainbow      Policy#: [id number]      Subscriber: pt  Medicaid Application Date:       Case Manager:  Disability Application Date:       Case Worker:   Emergency Contact Information Contact Information    Name Relation Home Work Mobile   Rezek,Philip Spouse [phone]     Geri, Hepler   [phone]   Ashleymarie, Granderson Relative   [phone]      Current Medical History  Patient Admitting Diagnosis: left CVA  History of Present Illness: 77 year old female admitted to [facility] 04/17/18. History of atrial fibrillation on no antiplatelet or anticoagulation.History also of HTN, hyperlipidemia, carotid artery occlusion . CAD, diverticulosis and UTI. She reportedly was getting into her car  And her right leg became acutely heavy and began experiencing intermittent jerking of the right upper and lower extremity. RLE spasms also. Patient also reported a few weeks earlier she had experienced numbness in her fingers on both hands that had resolved.  MRI brain shows acute and subacute left posteromedial frontal infarcts. MRA of head and neck unremarkable.  A1c 5.4. LDL 64 patient on statin. BP elevated on presentation.   Carotid duplex and echo unremarkable. EEG also unremarkable. Placed on Eliquis by Neuro. Keppra twice daily and ibuprofen bid, Ativan as needed  per Dr. Doy Mince, Neuro. Hold HTN meds to allow permissive BP due to acute stroke.    Patient's medical [facility] has been reviewed by the rehabilitation admission coordinator and physician.  NIH Stroke scale: 7 Glascow Coma Scale: n/a  Past Medical History  Past Medical History:  Diagnosis Date  . A-fib ([place])   . Arthritis   . Breast cancer ([place] ) 2002   RT LUMPECTOMY  . Bronchitis   . Carotid artery narrowings   . 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 Carotid artery occlusion    RIGHT 50% BLOCKAGE  . Colon polyps   . Complication of anesthesia    shaking/ FOLLOWING LOCAL AT DENTIST , drop O2 sats TO 50% DURING COLONOSCOPY  . Coronary artery disease   . Diverticulosis   . Dysrhythmia   . Environmental allergies   . GERD (gastroesophageal reflux disease)   . History of hiatal hernia   . Hyperlipidemia   . Hypertension   . IBS (irritable bowel syndrome)   . Mitral valve disorder   . Shortness of breath dyspnea   . Syncope and collapse   . UTI (lower urinary tract infection)  Family History   family history includes Heart disease in her mother; Hyperlipidemia in her brother; Hypertension in her brother; Kidney disease in her unknown relative; Lung cancer in her father and sister; Obesity in her brother; Prostate cancer in her father.  Prior Rehab/Hospitalizations Has the patient had major surgery during 100 days prior to admission? No    Current Medications  see MAR  Patients Current Diet:  Regular diet with thin liquids  Precautions / Restrictions     Has the patient had 2 or more falls or a fall with injury in the past year?No  Prior Activity Level    Prior Functional Level Self Care: Did the patient need help bathing, dressing, using the toilet or eating?  Independent  Indoor Mobility: Did the patient need assistance with walking from room to room (with or without device)? Independent  Stairs: Did the patient need assistance with  internal or external stairs (with or without device)? Independent  Functional Cognition: Did the patient need help planning regular tasks such as shopping or remembering to take medications? Independent  Home Assistive Devices / Equipment    Prior Device Use: Indicate devices/aids used by the patient prior to current illness, exacerbation or injury? None of the above   Prior Functional Level Current Functional Level  Bed Mobility         Transfers         Mobility - Walk/Wheelchair       mod assist 5 feet and 15 feet hand held assist 2 person   Upper Body Dressing         Lower Body Dressing         Grooming         Eating/Drinking         Toilet Transfer         Bladder Continence          Bowel Management         Stair Climbing   n/a      Communication         Memory         Cooking/Meal Prep         Housework       Money Management       Driving   yes      Special needs/care consideration BiPAP/CPAP n/a CPM  N/a Continuous Drip IV  N/a Dialysis  N/a Life Vest n/a Oxygen  N/a Special Bed  N/a Trach Size  N/a Wound Vac n/a Skin intact                        Bowel mgmt continent no LBM documented Bladder mgmt: continent Diabetic mgmt Hgb A1c 5.4  Previous Home Environment    Discharge Living Setting    [place]    Goals/Additional Needs    Patient Condition: I have reviewed medical records from [facility], and spoke with patient by phone. Patient will benefit from ongoing coordinated care provided by an acute inpatient rehabilitation admission. She requires ongoing PT and OT, Rehab Nursing and Rehab physician care., She is currently overall min to mod assist with all adls. We will admit to Acute Inpatient Rehabilitation today.  Preadmission Screen Completed By:  Cleatrice Burke, 04/19/2018 1:02  PM ______________________________________________________________________   Discussed status with Dr. Letta Pate  on  04/19/2018  at  [phone] and received telephone approval for admission today.  Admission Coordinator:  Cleatrice Burke, time  5643 Date  04/19/2018   Assessment/Plan: Diagnosis:Left frontal cerebral infarct 1. Does the need for close, 24 hr/day  Medical supervision in concert with the patient's rehab needs make it unreasonable for this patient to be served in a less intensive setting? Yes 2. Co-Morbidities requiring supervision/potential complications: HTN, [place] d/o 3. Due to bladder management, bowel management, safety, skin/wound care, disease management, medication administration, pain management and patient education, does the patient require 24 hr/day rehab nursing? Yes 4. Does the patient require coordinated care of a physician, rehab nurse, PT (1-2 hrs/day, 5 days/week), OT (1-2 hrs/day, 5 days/week) and SLP (.5-1 hrs/day, 5 days/week) to address physical and functional deficits in the context of the above medical diagnosis(es)? Yes Addressing deficits in the following areas: balance, endurance, locomotion, strength, transferring, bowel/bladder control, bathing, dressing, grooming, toileting, cognition and psychosocial support 5. Can the patient actively participate in an intensive therapy program of at least 3 hrs of therapy 5 days a week? Yes 6. The potential for patient to make measurable gains while on inpatient rehab is good 7. Anticipated functional outcomes upon discharge from inpatients are: supervision PT, supervision OT, supervision SLP 8. Estimated rehab length of stay to reach the above functional goals is: 7-10d 9. Does the patient have adequate social supports to accommodate these discharge functional goals? Yes 10. Anticipated D/C setting: Home 11. Anticipated post D/C treatments: [place] therapy 12. Overall Rehab/Functional Prognosis:  good    RECOMMENDATIONS: This patient's condition is appropriate for continued rehabilitative care in the following setting: CIR Patient has agreed to participate in recommended program. Yes Note that insurance prior authorization may be required for reimbursement for recommended care.  Comment: Charlett Blake M.D. [place] FAAPM&R (Sports Med, Neuromuscular Med) Diplomate Am Board of Electrodiagnostic Med  Cleatrice Burke 04/19/2018

## 2018-04-19 NOTE — Progress Notes (Deleted)
GY (1C)             Signed                     Expand widget buttonCollapse widget button    Show:Clear all   ManualTemplateCopied  Added by:     Cristina Gong, RN  Kirsteins, Luanna Salk, MD   Hover for detailscustomization button                                                                                                                                                           [employer]  PMR Admission Coordinator Pre-Admission Assessment     Patient: Isabel Hernandez is an 77 y.o., female  MRN: [id number]  DOB: December 30, 1940  Height: 5\' 4"  (162.6 cm)  Weight: 73.5 kg (162 lb)     Insurance Information  HMO:     PPO:      PCP:      IPA:      80/20:      OTHER: no HMO  PRIMARY: Medicare a and b      Policy#: [id number]      Subscriber: pt  Benefits:  Phone #: online     Name: 04/18/2018  Eff. Date: a 11/03/2004 b 11/03/04     Deduct: $1364      Out of Pocket Max: none      Life Max: none  CIR: 100%      SNF: 20 full days  Outpatient: 80%     Co-Pay: 20%  Home Health: 100%      Co-Pay: none  DME: 80%     Co-Pay: 20%  Providers: pt choice   SECONDARY: Roseanna Rainbow      Policy#: [id number]      Subscriber: pt     Medicaid Application Date:       Case Manager:   Disability Application Date:       Case Worker:      Emergency Contact Information           Contact Information            Name    Relation    Home    Work    Mobile         Strong,Philip   Spouse   [phone]                Joreen, Swearingin           [phone]        Aniyla, Harling   Relative           [phone]                 Current Medical History   Patient Admitting Diagnosis: left CVA  History of Present Illness: 77  year old female admitted to [facility]17/[facility]. History of atrial fibrillation on no antiplatelet or anticoagulation.History also of HTN, hyperlipidemia, carotid artery occlusion . CAD, diverticulosis and UTI. She reportedly was getting into her car  And her right leg became acutely heavy and began experiencing intermittent jerking of the right upper and lower extremity. RLE spasms also. Patient also reported a few weeks earlier she had experienced numbness in her fingers on both hands that had resolved.     MRI brain shows acute and subacute left posteromedial frontal infarcts. MRA of head and neck unremarkable.  A1c 5.4. LDL 64 patient on statin. BP elevated on presentation.      Carotid duplex and echo unremarkable. EEG also unremarkable. Placed on Eliquis by Neuro. Keppra twice daily and ibuprofen bid, Ativan as needed per Dr. Doy Mince, Neuro. Hold HTN meds to allow permissive BP due to acute stroke.         Patient's medical record from [facility] has been reviewed by the rehabilitation admission coordinator and physician.     NIH Stroke scale: 7  Glascow Coma Scale: n/a     Past Medical History        Past Medical History:    Diagnosis   Date    .   A-fib ([place])        .   Arthritis        .   Breast cancer ([place])   2002        RT LUMPECTOMY    .   Bronchitis        .   Carotid artery narrowings        .   Carotid artery occlusion            50% stenosis    .   Carotid artery occlusion            RIGHT 50% BLOCKAGE    .   Colon polyps        .   Complication of anesthesia            shaking/ FOLLOWING LOCAL AT DENTIST , drop O2 sats TO 50% DURING COLONOSCOPY    .   Coronary artery disease        .   Diverticulosis        .   Dysrhythmia        .   Environmental allergies        .   GERD (gastroesophageal reflux disease)        .   History of hiatal hernia        .    Hyperlipidemia        .   Hypertension        .   IBS (irritable bowel syndrome)        .   Mitral valve disorder        .   Shortness of breath dyspnea        .   Syncope and collapse        .   UTI (lower urinary tract infection)              Family History    family history includes Heart disease in her mother; Hyperlipidemia in her brother; Hypertension in her brother; Kidney disease in her unknown relative; Lung cancer in her father and sister; Obesity in her brother; Prostate cancer in her father.     Prior  Rehab/Hospitalizations  Has the patient had major surgery during 100 days prior to admission? No                    Current Medications   see MAR     Patients Current Diet:  Regular diet with thin liquids     Precautions / Restrictions  Precautions  Precautions: Fall  Restrictions  Weight Bearing Restrictions: No      Has the patient had 2 or more falls or a fall with injury in the past year?No     Prior Activity Level  Community (5-7x/wk): patient very active in her Senior community; cards, head of 3 committess; drove     Prior Functional Level  Self Care: Did the patient need help bathing, dressing, using the toilet or eating?   Independent     Indoor Mobility: Did the patient need assistance with walking from room to room (with or without device)? Independent     Stairs: Did the patient need assistance with internal or external stairs (with or without device)? Independent     Functional Cognition: Did the patient need help planning regular tasks such as shopping or remembering to take medications? Independent     Home Assistive Devices / Equipment  Home Assistive Devices/Equipment: None  Home Equipment: None     Prior Device Use: Indicate devices/aids used by the patient prior to current illness, exacerbation or injury? None of the above          Prior Functional Level    Current Functional Level    Bed Mobility      Independent      Min assist(increased effort and time for RLE mgmt)       Transfers      Independent      Mod assist(tremors and inability to effectiviely take weight thru RLE)       Mobility - Walk/Wheelchair      Independent      Mod assist(small side shift with direct asisst of PT; min to mod with b) mod assist 5 feet and 15 feet hand held assist 2 person       Upper Body Dressing      Independent      Mod assist       Lower Body Dressing      Independent      Mod assist       Grooming      Independent      Min assist(set up)       Eating/Drinking      Independent      Mod Independent       Toilet Transfer      Independent      Mod assist       Bladder Continence       continent      continent       Bowel Management      continent      continent       Stair Climbing       n/a      Other       Communication      independent      independent       Memory      intact      intact       Cooking/Meal Prep      independent            Housework  independent           Money Management      independent           Driving       yes                 Special needs/care consideration  BiPAP/CPAP n/a  CPM  N/a  Continuous Drip IV  N/a  Dialysis  N/a  Life Vest n/a  Oxygen  N/a  Special Bed  N/a  Trach Size  N/a  Wound Vac n/a  Skin intact                         Bowel mgmt continent no LBM documented  Bladder mgmt: continent  Diabetic mgmt Hgb A1c 5.4     Previous Home Environment  Living Arrangements: Spouse/significant other   Lives With: Spouse  Available Help at Discharge: Family, Available 24 hours/day  Type of Home: Other(Comment)(independent cottage at [employer] [facility] [employer] senior living facility)  Home Layout: One  level  Home Access: Level entry  Bathroom Accessibility: Yes  How Accessible: Accessible via walker  Home Care Services: No  Additional Comments: patient is retired Cabin crew in [place], spouse worked for the [employer]     Discharge Living Setting  Plans for Discharge Living Setting: Patient's home, Other (Comment)(ILF cottage at [employer])  Type of Home at Discharge: (cottage)  Discharge Home Layout: One level  Discharge Home Access: Level entry  Discharge Bathroom Accessibility: Yes  How Accessible: Accessible via walker  Does the patient have any problems obtaining your medications?: No     [facility] [employer] [facility]  Patient Roles: Spouse, Parent, Company secretary Information: spouse, Arnette Norris  Anticipated Caregiver: spouse  Anticipated Ambulance person Information: see above  Ability/Limitations of Caregiver: none  Caregiver Availability: 24/7  Discharge Plan Discussed with Primary Caregiver: Yes  Is Caregiver In Agreement with Plan?: Yes  Does Caregiver/Family have Issues with Lodging/Transportation while Pt is in Rehab?: No     Goals/Additional Needs  Patient/Family Goal for Rehab: superivison PT, supervision OT  Expected length of stay: ELOS 10 to 14 days  Pt/Family Agrees to Admission and willing to participate: Yes  Program Orientation Provided & Reviewed with Pt/Caregiver Including Roles  & Responsibilities: Yes     Patient Condition: I have reviewed medical records from [facility], and spoke with patient by phone. Patient will benefit from ongoing coordinated care provided by an acute inpatient rehabilitation admission. She requires ongoing PT and OT, Rehab Nursing and Rehab physician care., She is currently overall min to mod assist with all adls. We will admit to Acute Inpatient Rehabilitation today.     Preadmission Screen Completed By:  Cleatrice Burke, 04/19/2018 8:11  AM  ______________________________________________________________________    Discussed status with Dr. Letta Pate  on  04/19/2018  at  [phone] and received telephone approval for admission today.     Admission Coordinator:  Cleatrice Burke, time  8315 Date  04/19/2018      Assessment/Plan:  Diagnosis:Left frontal cerebral infarct  1.Does the need for close, 24 hr/day  Medical supervision in concert with the patient's rehab needs make it unreasonable for this patient to be served in a less intensive setting? Yes   2.Co-Morbidities requiring supervision/potential complications: HTN, [place] d/o   3.Due to bladder management, bowel management, safety, skin/wound care, disease management, medication administration, pain management and patient education, does the patient require 24 hr/day rehab nursing?  Yes   4.Does the patient require coordinated care of a physician, rehab nurse, PT (1-2 hrs/day, 5 days/week), OT (1-2 hrs/day, 5 days/week) and SLP (.5-1 hrs/day, 5 days/week) to address physical and functional deficits in the context of the above medical diagnosis(es)? Yes Addressing deficits in the following areas: balance, endurance, locomotion, strength, transferring, bowel/bladder control, bathing, dressing, grooming, toileting, cognition and psychosocial support   5.Can the patient actively participate in an intensive therapy program of at least 3 hrs of therapy 5 days a week? Yes   6.The potential for patient to make measurable gains while on inpatient rehab is good   7.Anticipated functional outcomes upon discharge from inpatients are: supervision PT, supervision OT, supervision SLP   8.Estimated rehab length of stay to reach the above functional goals is: 7-10d   9.Does the patient have adequate social supports to accommodate these discharge functional goals? Yes   10.Anticipated D/C setting: Home   11.Anticipated post D/C treatments: [place] therapy   12.Overall  Rehab/Functional Prognosis: good            RECOMMENDATIONS:  This patient's condition is appropriate for continued rehabilitative care in the following setting: CIR  Patient has agreed to participate in recommended program. Yes  Note that insurance prior authorization may be required for reimbursement for recommended care.     Comment:  Charlett Blake M.D.  [place]  FAAPM&R (Sports Med, Neuromuscular Med)  Diplomate Am Board of Electrodiagnostic Med     Cleatrice Burke  04/19/2018

## 2018-04-19 NOTE — Discharge Summary (Addendum)
[facility] [place] NAME: Isabel Hernandez    MR#:  [id number]  DATE OF BIRTH:  11-09-1940  DATE OF ADMISSION:  04/16/2018   ADMITTING PHYSICIAN: Demetrios Loll, MD  DATE OF DISCHARGE: 04/19/2018  PRIMARY CARE PHYSICIAN: Glendon Axe, MD   ADMISSION DIAGNOSIS:  Tremor [R25.1] Right arm weakness [R29.898] Acute cystitis without hematuria [N30.00] Weakness of right lower extremity [R29.898] Acute CVA (cerebrovascular accident) ([place]) [I63.9] DISCHARGE DIAGNOSIS:  Principal Problem:   Tremor Active Problems:   Essential hypertension   Hyperlipidemia   GERD (gastroesophageal reflux disease)   UTI (urinary tract infection)   Acute CVA (cerebrovascular accident) ([place])  SECONDARY DIAGNOSIS:   Past Medical History:  Diagnosis Date  . A-fib ([place])   . Arthritis   . Breast cancer ([place]) 2002   RT LUMPECTOMY  . Bronchitis   . Carotid artery narrowings   . 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 Carotid artery occlusion    RIGHT 50% BLOCKAGE  . Colon polyps   . Complication of anesthesia    shaking/ FOLLOWING LOCAL AT DENTIST , drop O2 sats TO 50% DURING COLONOSCOPY  . Coronary artery disease   . Diverticulosis   . Dysrhythmia   . Environmental allergies   . GERD (gastroesophageal reflux disease)   . History of hiatal hernia   . Hyperlipidemia   . Hypertension   . IBS (irritable bowel syndrome)   . Mitral valve disorder   . Shortness of breath dyspnea   . Syncope and collapse   . UTI (lower urinary tract infection)    HOSPITAL COURSE:  KayNorusisis a77 y.o.femalewith a known history of A. fib, hypertension, hyperlipidemia, carotid artery occlusion, CAD, diverticulosis, UTI etc. The patient presents to the ED with tremors started approximately 30 minutes prior to arrival. She also complains of heaviness on the right leg, tingling and numbness in her hands and feet.The patient denies any similar symptoms before. MRI of the  brain show acute CVA,2 cm acute infarction within the left posteromedial frontal lobe.  Acute CVA. The patient was treated with aspirin 325 mg p.o. daily, Lipitor 40 mg p.o. at bedtime, neuro check,. Carotid duplex and echocardiograph are unremarkable. PT and OT evaluation: CIR. EEG is unremarkable. Eliquis prophylaxis per Dr. Doy Mince.  Tremor and jerking.  Continue Keppra twice daily and ibuprofen bid, Ativan as needed per Dr. Doy Mince.  Hypertension. Hold hypertension medication to allow permissive blood pressure due to acute stroke.  History of A. fib.   Changed to Eliquis per Dr. Doy Mince. Discussed with Dr. Doy Mince. DISCHARGE CONDITIONS:  Stable, discharge to acute rehab today. CONSULTS OBTAINED:  Treatment Team:  Alexis Goodell, MD DRUG ALLERGIES:   Allergies  Allergen Reactions  . Nitroglycerin Other (See Comments)    Can only use the patch  Turned lobster red and felt faint with sublingual NTG  . Penicillins Hives    Hives Has patient had a PCN reaction causing immediate rash, facial/tongue/throat swelling, SOB or lightheadedness with hypotension: YES Has patient had a PCN reaction causing severe rash involving mucus membranes or skin necrosis: NO Has patient had a PCN reaction that required hospitalization NO Has patient had a PCN reaction occurring within the last 10 years: No If all of the above answers are "NO", then may proceed with Cephalosporin use.  . Biaxin [Clarithromycin] Rash    Rash   . Latex Rash  . Lidocaine Swelling    Eye swelling Tolerates Tetracaine with epidural  steroid injections (04/10/17)  . Nickel Rash  . Oxycodone Hives  . Tetracyclines & Related Rash       . Tizanidine Rash   DISCHARGE MEDICATIONS:   Allergies as of 04/19/2018      Reactions   Nitroglycerin Other (See Comments)   Can only use the patch  Turned lobster red and felt faint with sublingual NTG   Penicillins Hives   Hives Has patient had a PCN reaction causing  immediate rash, facial/tongue/throat swelling, SOB or lightheadedness with hypotension: YES Has patient had a PCN reaction causing severe rash involving mucus membranes or skin necrosis: NO Has patient had a PCN reaction that required hospitalization NO Has patient had a PCN reaction occurring within the last 10 years: No If all of the above answers are "NO", then may proceed with Cephalosporin use.   Biaxin [clarithromycin] Rash   Rash    Latex Rash   Lidocaine Swelling   Eye swelling Tolerates Tetracaine with epidural steroid injections (04/10/17)   Nickel Rash   Oxycodone Hives   Tetracyclines & Related Rash      Tizanidine Rash      Medication List    STOP taking these medications   amLODipine 5 MG tablet Commonly known as:  NORVASC   losartan 50 MG tablet Commonly known as:  COZAAR   metoprolol tartrate 25 MG tablet Commonly known as:  LOPRESSOR     TAKE these medications   apixaban 5 MG Tabs tablet Commonly known as:  ELIQUIS Take 1 tablet (5 mg total) by mouth 2 (two) times daily.   atorvastatin 40 MG tablet Commonly known as:  LIPITOR Take 1 tablet (40 mg total) by mouth daily at 6 PM. What changed:    medication strength  how much to take  when to take this   fenofibrate 145 MG tablet Commonly known as:  TRICOR TAKE ONE TABLET BY MOUTH EVERY DAY   fluticasone 50 MCG/ACT nasal spray Commonly known as:  FLONASE Place 2 sprays into both nostrils daily.   levETIRAcetam 500 MG tablet Commonly known as:  KEPPRA Take 1 tablet (500 mg total) by mouth 2 (two) times daily.   multivitamin with minerals Tabs tablet Take 1 tablet by mouth daily.   Vitamin D (Ergocalciferol) 50000 units Caps capsule Commonly known as:  DRISDOL Take 1 capsule (50,000 Units total) by mouth every 7 (seven) days.        DISCHARGE INSTRUCTIONS:  See AVS.  If you experience worsening of your admission symptoms, develop shortness of breath, life threatening emergency,  suicidal or homicidal thoughts you must seek medical attention immediately by calling 911 or calling your MD immediately  if symptoms less severe.  You Must read complete instructions/literature along with all the possible adverse reactions/side effects for all the Medicines you take and that have been prescribed to you. Take any new Medicines after you have completely understood and accpet all the possible adverse reactions/side effects.   Please note  You were cared for by a hospitalist during your hospital stay. If you have any questions about your discharge medications or the care you received while you were in the hospital after you are discharged, you can call the unit and asked to speak with the hospitalist on call if the hospitalist that took care of you is not available. Once you are discharged, your primary care physician will handle any further medical issues. Please note that NO REFILLS for any discharge medications will be authorized once you are  discharged, as it is imperative that you return to your primary care physician (or establish a relationship with a primary care physician if you do not have one) for your aftercare needs so that they can reassess your need for medications and monitor your lab values.    On the day of Discharge:  VITAL SIGNS:  Blood pressure (!) 142/79, pulse 76, temperature 98.2 F (36.8 C), temperature source Oral, resp. rate 18, height 5\' 4"  (1.626 m), weight 162 lb (73.5 kg), SpO2 93 %. PHYSICAL EXAMINATION:  GENERAL:  77 y.o.-year-old patient lying in the bed with no acute distress.  EYES: Pupils equal, round, reactive to light and accommodation. No scleral icterus. Extraocular muscles intact.  HEENT: Head atraumatic, normocephalic. Oropharynx and nasopharynx clear.  NECK:  Supple, no jugular venous distention. No thyroid enlargement, no tenderness.  LUNGS: Normal breath sounds bilaterally, no wheezing, rales,rhonchi or crepitation. No use of accessory  muscles of respiration.  CARDIOVASCULAR: S1, S2 normal. No murmurs, rubs, or gallops.  ABDOMEN: Soft, non-tender, non-distended. Bowel sounds present. No organomegaly or mass.  EXTREMITIES: No pedal edema, cyanosis, or clubbing.  NEUROLOGIC: Cranial nerves II through XII are intact. Muscle strength 5/5 in all extremities except Right leg weakness 2 out of 5.  Hand tremor. Sensation intact. Gait not checked.  PSYCHIATRIC: The patient is alert and oriented x 3.  SKIN: No obvious rash, lesion, or ulcer.  DATA REVIEW:   CBC Recent Labs  Lab 04/17/18 0908  WBC 5.4  HGB 12.1  HCT 34.8*  PLT 113*    Chemistries  Recent Labs  Lab 04/16/18 1932 04/17/18 0740  NA 138  --   K 3.5  --   CL 105  --   CO2 22  --   GLUCOSE 169*  --   BUN 18  --   CREATININE 1.00 0.82  CALCIUM 9.3  --   AST 41  --   ALT 21  --   ALKPHOS 46  --   BILITOT 1.2  --      Microbiology Results  No results found for this or any previous visit.  RADIOLOGY:  No results found.   Management plans discussed with the patient, her husband and they are in agreement.  CODE STATUS: Full Code   TOTAL TIME TAKING CARE OF THIS PATIENT: 33 minutes.    Demetrios Loll M.D on 04/19/2018 at 8:06 AM  Between 7am to 6pm - Pager - [phone]  After 6pm go to [email or web address] - Proofreader  [employer] [facility]  Office  [phone]  CC: Primary care physician; Glendon Axe, MD   Note: This dictation was prepared with Dragon dictation along with smaller phrase technology. Any transcriptional errors that result from this process are unintentional.

## 2018-04-19 NOTE — Progress Notes (Signed)
Subjective: Patient with significant improvement in jerking.  Spasms continue in right lower extremity and leg continues to feel heavy.    Objective: Current vital signs: BP (!) 154/73   Pulse 92   Temp 98.3 F (36.8 C) (Oral)   Resp 18   Ht 5\' 4"  (1.626 m)   Wt 73.5 kg (162 lb)   SpO2 92%   BMI 27.81 kg/m  Vital signs in last 24 hours: Temp:  [98 F (36.7 C)-98.8 F (37.1 C)] 98.3 F (36.8 C) (07/18 0925) Pulse Rate:  [76-99] 92 (07/18 0925) Resp:  [16-18] 18 (07/18 0454) BP: (129-154)/(62-88) 154/73 (07/18 0925) SpO2:  [92 %-95 %] 92 % (07/18 0925) Weight:  [73.5 kg (162 lb)] 73.5 kg (162 lb) (07/17 1400)  Intake/Output from previous day: 07/17 0701 - 07/18 0700 In: 200 [P.O.:200] Out: 75 [Urine:75] Intake/Output this shift: Total I/O In: 240 [P.O.:240] Out: -  Nutritional status:  Diet Order           Diet - low sodium heart healthy        Diet Heart Room service appropriate? Yes; Fluid consistency: Thin  Diet effective now          Neurologic Exam: Mental Status: Alert, oriented, thought content appropriate. Speech fluent without evidence of aphasia. Able to follow 3 step commands without difficulty. Cranial Nerves: II: Discs flat bilaterally; Visual fields grossly normal, pupils equal, round, reactive to light and accommodation III,IV, VI: ptosis not present, extra-ocular motions intact bilaterally V,VII: smile symmetric, facial light touch sensation normal bilaterally VIII: hearing normal bilaterally IX,X: gag reflex present XI: bilateral shoulder shrug XII: midline tongue extension Motor: RUE 5-/5, RLE 3-4/5.   Left 5/5. Minimal upper extremity jerking noted.  NO spasms noted at this time.   Sensory: Pinprick and light touch intact throughout, bilaterally Deep Tendon Reflexes: 2+ and symmetric throughout Plantars: Right:upgoingLeft: downgoing   Lab Results: Basic Metabolic Panel: Recent Labs  Lab 04/16/18 1932  04/17/18 0740  NA 138  --   K 3.5  --   CL 105  --   CO2 22  --   GLUCOSE 169*  --   BUN 18  --   CREATININE 1.00 0.82  CALCIUM 9.3  --     Liver Function Tests: Recent Labs  Lab 04/16/18 1932  AST 41  ALT 21  ALKPHOS 46  BILITOT 1.2  PROT 6.5  ALBUMIN 4.3   No results for input(s): LIPASE, AMYLASE in the last 168 hours. No results for input(s): AMMONIA in the last 168 hours.  CBC: Recent Labs  Lab 04/16/18 1932 04/17/18 0908  WBC 5.9 5.4  NEUTROABS 2.4  --   HGB 12.6 12.1  HCT 35.5 34.8*  MCV 99.8 100.0  PLT 127* 113*    Cardiac Enzymes: Recent Labs  Lab 04/16/18 1932  TROPONINI <0.03    Lipid Panel: Recent Labs  Lab 04/17/18 0740 04/18/18 0413  CHOL 108 104  TRIG 133 144  HDL 17* 15*  CHOLHDL 6.4 6.9  VLDL 27 29  LDLCALC 64 60    CBG: No results for input(s): GLUCAP in the last 168 hours.  Microbiology: No results found for this or any previous visit.  Coagulation Studies: No results for input(s): LABPROT, INR in the last 72 hours.  Imaging: US Carotid Bilateral (at [facility] Only)  Result Date: 04/17/2018 CLINICAL DATA:  Stroke. History of hypertension hyperlipidemia. Former smoker EXAM: BILATERAL CAROTID DUPLEX ULTRASOUND TECHNIQUE: Pearline Cables scale imaging, color Doppler and  duplex ultrasound were performed of bilateral carotid and vertebral arteries in the neck. COMPARISON:  None. FINDINGS: Criteria: Quantification of carotid stenosis is based on velocity parameters that correlate the residual internal carotid diameter with NASCET-based stenosis levels, using the diameter of the distal internal carotid lumen as the denominator for stenosis measurement. The following velocity measurements were obtained: RIGHT ICA:  71/16 cm/sec CCA:  82/70 cm/sec SYSTOLIC ICA/CCA RATIO:  1.2 ECA:  57 cm/sec LEFT ICA:  65/9 cm/sec CCA:  78/67 cm/sec SYSTOLIC ICA/CCA RATIO:  1.0 ECA:  50 cm/sec RIGHT CAROTID ARTERY: There is a minimal amount mixed echogenic plaque  with the mid (image 8) and distal (image 13) aspects of the right common carotid artery. There is a moderate amount of eccentric echogenic plaque within the right carotid bulb (images 16 and 18), extending to involve the origin and proximal aspects the right internal carotid artery (image 26), not resulting in elevated peak systolic velocities within the interrogated course of the right internal carotid artery to suggest a hemodynamically significant stenosis. RIGHT VERTEBRAL ARTERY:  Antegrade flow LEFT CAROTID ARTERY: There is a moderate amount of echogenic shadowing plaque within the mid aspect of the left common carotid artery (image 44). There is a moderate amount of eccentric echogenic densely shadowing plaque within the left carotid bulb (images 51 and 53), extending to involve the origin and proximal aspects of the left internal carotid artery (image 62), not resulting in elevated peak systolic velocities within the interrogated course of the left internal carotid artery to suggest a hemodynamically significant stenosis. LEFT VERTEBRAL ARTERY:  Antegrade flow IMPRESSION: Moderate amount of bilateral atherosclerotic plaque, right subjectively greater than left, not resulting in a hemodynamically significant stenosis within either internal carotid artery. Electronically Signed   By: Sandi Mariscal M.D.   On: 04/17/2018 12:09    Medications: I have reviewed the patient's current medications.   Assessment/Plan: Patient with improvement in jerking and spasms on Keppra and Baclofen. Tolerating without significant side effects. Has not needed the prn Ativan.    Recommendations: 1.  Would consider increase in Baclofen to 30mg  at night while continuing the AM dose and Keppra if necessary for continued movement control 2.  Agree with therapy   LOS: 2 days   Alexis Goodell, MD Neurology [phone] 04/19/2018  10:49 AM

## 2018-04-19 NOTE — Progress Notes (Signed)
Pt alert and oriented. NIH 6. No neuro deficits noted. Neuro checks Q 4 hrs; VSS. C/o leg pain times one with relief with tylenol. No respiratory distress noted, now on R/A, sats 93% this am. Continue to monitor

## 2018-04-19 NOTE — Progress Notes (Signed)
Inpatient Rehabilitation Admissions Coordinator  I spoke with patient and her spouse by phone and they are in agreement to admit to inpt rehab today. I will make the arrangements to admit.  Danne Baxter, RN, MSN Rehab Admissions Coordinator [phone] 04/19/2018 9:13 AM

## 2018-04-19 NOTE — Progress Notes (Signed)
Physical Therapy Note Physical Therapy Treatment Patient Details Name: Isabel Hernandez MRN: [id number] DOB: May 21, 1941 Today's Date: 04/19/2018    History of Present Illness 77 y.o. female with a known history of A. fib, hypertension, hyperlipidemia, carotid artery occlusion, CAD, diverticulosis, UTI.  She arrived morning of eval with tremor, heaviness on the right leg (and UE), tingling and numbness in her hands and feet.  The patient denies any similar symptoms before.  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 Pt showed decrease in R sided tremor and demonstrated increased coordination and strength. Pt was able to perform sit to stand again with good carryover of technique from previous therapy session. Pt required min assist from therapist and was able to achieve upright position with minimal UE push-off. Pt showed increased ability to shift weight to RLE and LLE. Pt showed more control of RLE, including improved ability to control knee position and activate quads to maintain standing balance. Therapy time was limited due to pt transfer to CIR being initiated during session. Pt continues to show great potential for improvement and transfer to CIR upon d/c is still recommended.    Follow Up Recommendations  CIR     Equipment Recommendations       Recommendations for Other Services     Precautions / Restrictions Precautions Precautions: Fall Restrictions Weight Bearing Restrictions: No    Mobility  Bed Mobility Overal bed mobility: Needs Assistance Bed Mobility: Supine to Sit Supine to sit: Min assist General bed mobility comments: Pt naturally reaches with RUE to therpaist hand for help rotating and flexing trunk  Transfers Overall transfer level: Needs assistance Equipment used: 2 person hand held assist Transfers: Sit to/from  [employer] Transfers Sit to Stand: Mod assist Stand pivot transfers: Mod assist;+2 physical assistance General transfer comment: min assist for R LE placement to promote symmetrical WBing; over-pressure to R quads to facilitate contraction and closed-chain WBing/stability.  Min assist at pelvis faciliating weigh shift to R LE.   Ambulation/Gait   Stairs   Wheelchair Mobility  Modified Rankin (Stroke Patients Only)     Balance Overall balance assessment: Needs assistance Sitting-balance support: Feet supported;No upper extremity supported Sitting balance-Leahy Scale: Fair Sitting balance - Comments: Pt can maintian upright sitting posture when allowed to have BUE on bed for support. Does tend to drift to R, but is able to readjust indep Standing balance support: Single extremity supported Standing balance-Leahy Scale: Poor Standing balance comment: R posterior/lateral lean, constant mod assist 1-2 for correction    Cognition Arousal/Alertness: Awake/alert Behavior During Therapy: WFL for tasks assessed/performed Overall Cognitive Status: Within Functional Limits for tasks assessed General Comments: clear communication, easily makes needs knows and demonstrates ability to participate with fluid conversation and humor    Exercises Other Exercises Other Exercises: Sit/stand x 3 with unilateral hand support and RLE knee blocked mod assist 2+; Standing balance actvitites: RUE WB through surface with LUE reaching initiating LE weight shifting and balance, therapist guided pelvic position to facilitate weight shift side to side as well as blocked R knee for RLE stability.     General Comments      Pertinent Vitals/Pain     Home Living      Prior Function       PT Goals (current goals can now be found in the care plan section) Acute Rehab PT  Goals Patient Stated Goal: get back to her normal active self PT Goal Formulation: With patient Time For  Goal Achievement: 05/01/18 Potential to Achieve Goals: Good    Frequency    7X/week      PT Plan Other (comment)(Pt has d/c to CIR.)    Co-evaluation    AM-PAC PT "6 Clicks" Daily Activity  Outcome Measure  Difficulty turning over in bed (including adjusting bedclothes, sheets and blankets)?: Unable Difficulty moving from lying on back to sitting on the side of the bed? : Unable Difficulty sitting down on and standing up from a chair with arms (e.g., wheelchair, bedside commode, etc,.)?: Unable Help needed moving to and from a bed to chair (including a wheelchair)?: A Lot Help needed walking in hospital room?: A Lot Help needed climbing 3-5 steps with a railing? 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

## 2018-04-19 NOTE — H&P (Addendum)
Physical Medicine and Rehabilitation Admission H&P        Chief Complaint  Patient presents with  . Stroke with functional deficits.      HPI: Isabel Hernandez is a 77 year old female with history of HTN, CAS, A fib, Lumbar stenosis with radiculopathy who was admitted to [facility] on 04/17/18 with numbness and jerking movements of RUE and RLE.  MRI/MRA brain done revealing left posteromedial frontal lobe infarct including precentral gyrus and increased diffusion left posterior frontal white matter probably due to subacute infarct or question acute inflammatory or demyelinating lesion---4-6 week follow up recommended to assess change.  No carotid or vertebral stenosis. 2D echo showed EF 65-70% with calcified mitral valve and mild LVH.  Carotid dopplers showed moderate bilateral atherosclerotic plaque  R > L without significant stenosis.  She was placed on Eliquis for embolic stroke and started on Keppra to help manage spasms/jerks. Baclofen added with improvement in spasms but she continues to have significant impairments in mobility as well as ability to carry out ADLs CIR recommended due to functional deficits.     Review of Systems  Constitutional: Negative for chills and fever.  HENT: Negative for hearing loss.   Eyes: Negative for blurred vision and double vision.  Respiratory: Negative for cough and shortness of breath.   Cardiovascular: Negative for chest pain and palpitations.  Gastrointestinal: Positive for constipation and heartburn.  Genitourinary: Negative for dysuria and urgency.  Musculoskeletal: Positive for joint pain (right hip and bilateral groin pain) and myalgias.  Skin: Negative for rash.  Neurological: Positive for sensory change and focal weakness. Negative for dizziness.  Psychiatric/Behavioral: Negative for memory loss. The patient does not have insomnia.           Past Medical History:  Diagnosis Date  . A-fib ([place])    . Arthritis    . Breast cancer ([place]) 2002    RT  LUMPECTOMY  . Bronchitis    . Carotid artery narrowings    . Carotid artery occlusion      50% stenosis  . Carotid artery occlusion      RIGHT 50% BLOCKAGE  . Colon polyps    . Complication of anesthesia      shaking/ FOLLOWING LOCAL AT DENTIST , drop O2 sats TO 50% DURING COLONOSCOPY  . Coronary artery disease    . Diverticulosis    . Dysrhythmia    . Environmental allergies    . GERD (gastroesophageal reflux disease)    . History of hiatal hernia    . Hyperlipidemia    . Hypertension    . IBS (irritable bowel syndrome)    . Mitral valve disorder    . Shortness of breath dyspnea    . Syncope and collapse    . UTI (lower urinary tract infection)             Past Surgical History:  Procedure Laterality Date  . APPENDECTOMY      . BREAST BIOPSY Right 2002    POS  . BREAST CYST ASPIRATION Right    . BREAST EXCISIONAL BIOPSY Left 1997    NEG  . BREAST LUMPECTOMY      . CATARACT EXTRACTION W/PHACO Right 06/07/2016    Procedure: CATARACT EXTRACTION PHACO AND INTRAOCULAR LENS PLACEMENT (IOC);  Surgeon: Birder Robson, MD;  Location: [facility] ORS;  Service: Ophthalmology;  Laterality: Right;  [place] 00:51 AP% 19.6 CDE 10.09 Fluid pack lot # [id number]  . CATARACT EXTRACTION W/PHACO Left 07/05/2016  Procedure: CATARACT EXTRACTION PHACO AND INTRAOCULAR LENS PLACEMENT (IOC);  Surgeon: Birder Robson, MD;  Location: [facility] ORS;  Service: Ophthalmology;  Laterality: Left;  [place] 00:38 AP% 24.9 CDE 9.60 Fluid Pack lot # [id number]  . CESAREAN SECTION      . TONSILLECTOMY      . TOTAL KNEE ARTHROPLASTY Left             Family History  Problem Relation Age of Onset  . Lung cancer Father    . Prostate cancer Father    . Hyperlipidemia Brother    . Hypertension Brother    . Obesity Brother    . Heart disease Mother    . Lung cancer Sister    . Kidney disease Unknown          Maternal grandparent  . Breast cancer Neg Hx        Social History:  Married. Retired Forensic psychologist. She  reports that she quit smoking about 48 years ago. Her smoking use included cigarettes. She quit after 10.00 years of use. She has never used smokeless tobacco. She reports that she drinks couple of mixed drinks per week.  She reports that she does not use drugs.          Allergies  Allergen Reactions  . Nitroglycerin Other (See Comments)      Can only use the patch  Turned lobster red and felt faint with sublingual NTG  . Penicillins Hives      Hives Has patient had a PCN reaction causing immediate rash, facial/tongue/throat swelling, SOB or lightheadedness with hypotension: YES Has patient had a PCN reaction causing severe rash involving mucus membranes or skin necrosis: NO Has patient had a PCN reaction that required hospitalization NO Has patient had a PCN reaction occurring within the last 10 years: No If all of the above answers are "NO", then may proceed with Cephalosporin use.  . Biaxin [Clarithromycin] Rash      Rash   . Latex Rash  . Lidocaine Swelling      Eye swelling Tolerates Tetracaine with epidural steroid injections (04/10/17)  . Nickel Rash  . Oxycodone Hives  . Tetracyclines & Related Rash           . Tizanidine Rash            Medications Prior to Admission  Medication Sig Dispense Refill  . amLODipine (NORVASC) 5 MG tablet TAKE ONE TABLET EVERY DAY 90 tablet 1  . atorvastatin (LIPITOR) 20 MG tablet TAKE ONE TABLET BY MOUTH EVERY DAY 90 tablet 3  . fenofibrate (TRICOR) 145 MG tablet TAKE ONE TABLET BY MOUTH EVERY DAY 90 tablet 3  . fluticasone (FLONASE) 50 MCG/ACT nasal spray Place 2 sprays into both nostrils daily. 16 g 6  . metoprolol tartrate (LOPRESSOR) 25 MG tablet TAKE 1 TABLET BY MOUTH TWICE DAILY 60 tablet 0  . Multiple Vitamin (MULTIVITAMIN WITH MINERALS) TABS tablet Take 1 tablet by mouth daily.      Marland Kitchen losartan (COZAAR) 50 MG tablet TAKE 1 TABLET BY MOUTH DAILY (Patient not taking: Reported on 04/17/2018) 30 tablet 0  . Vitamin D, Ergocalciferol,  (DRISDOL) 50000 units CAPS capsule Take 1 capsule (50,000 Units total) by mouth every 7 (seven) days. (Patient not taking: Reported on 04/17/2018) 12 capsule 0      Drug Regimen Review  Drug regimen was reviewed and remains appropriate with no significant issues identified   Home: Home Living Family/patient expects to be discharged to:: Private  residence Living Arrangements: Spouse/significant other Available Help at Discharge: Family, Available 24 hours/day Type of Home: Other(Comment)(independent cottage at [place] living facility) Home Access: Level entry Roundup: One level Bathroom Accessibility: Yes Home Equipment: None Additional Comments: patient is retired Cabin crew in [place], spouse worked for the [employer] With: Spouse   Functional History: Prior Function Level of Independence: Independent Comments: Pt able to drive, run errands, some chronic R hip pain with prolonged ambulation   Functional Status:  Mobility: Bed Mobility Overal bed mobility: Needs Assistance Bed Mobility: Supine to Sit Sidelying to sit: Min assist Supine to sit: Min assist General bed mobility comments: encouraged for R UE reaching across body/midline to facilitate trunk flexion and initial rotation Transfers Overall transfer level: Needs assistance Equipment used: 2 person hand held assist Transfers: Sit to/from Stand Sit to Stand: Mod assist General transfer comment: min assist for R LE placement to promote symmetrical WBing; over-pressure to R quads to facilitate contraction and closed-chain WBing/stability.  Able to position and maintain R ankle this date with all standing activities Ambulation/Gait Ambulation/Gait assistance: Mod assist, +2 physical assistance Gait Distance (Feet): (5, 15) Assistive device: 2 person hand held assist General Gait Details: mod manual cuing for L ant/lateral weight shift throughout gait cycle.  Able to indep advance R LE (inconsistent step height/length  and foot placement), but requires min/mod assist with step by step verbal cuing for R TKE in loading phases of gait cycle.  Decreased R foot heel strike/toe off, limited anterior translation of tibia.     ADL: ADL Overall ADL's : Needs assistance/impaired Eating/Feeding: Sitting, Modified independent Eating/Feeding Details (indicate cue type and reason): using L non-dom hand Grooming: Sitting, Wash/dry face, Set up Grooming Details (indicate cue type and reason): primarily using L non-dom hand Upper Body Bathing: Sitting, Moderate assistance, Minimal assistance Lower Body Bathing: Moderate assistance, Sitting/lateral leans Upper Body Dressing : Sitting, Minimal assistance, Moderate assistance Lower Body Dressing: Sitting/lateral leans, Moderate assistance   Cognition: Cognition Overall Cognitive Status: Within Functional Limits for tasks assessed Orientation Level: Oriented X4 Cognition Arousal/Alertness: Awake/alert Behavior During Therapy: WFL for tasks assessed/performed Overall Cognitive Status: Within Functional Limits for tasks assessed General Comments: clear communication, easily makes needs knows and demonstrates ability to participate with fluid conversation     Blood pressure (!) 154/73, pulse 92, temperature 98.3 F (36.8 C), temperature source Oral, resp. rate 18, height 5\' 4"  (1.626 m), weight 73.5 kg (162 lb), SpO2 92 %. Physical Exam  Nursing note and vitals reviewed. Neurological:  Intermittent tremors RUE.     General: No acute distress Mood and affect are appropriate Heart: Regular rate and rhythm no rubs murmurs or extra sounds Lungs: Clear to auscultation, breathing unlabored, no rales or wheezes Abdomen: Positive bowel sounds, soft nontender to palpation, nondistended Extremities: No clubbing, cyanosis, or edema Skin: No evidence of breakdown, no evidence of rash Neurologic: Cranial nerves II through XII intact, motor strength is 5/5 in left deltoid,  bicep, tricep, grip, hip flexor, knee extensors, ankle dorsiflexor and plantar flexor 3/5 right deltoid bicep tricep grip 2/5 in the right hip flexor knee extensor 3- at the ankle dorsiflexor. Sensory exam normal sensation to light touch and proprioception in bilateral upper and lower extremities Cerebellar exam normal left finger to nose to finger as well as heel to shin in bilateral upper and lower extremities Musculoskeletal: Full range of motion in all 4 extremities. No joint swelling   Lab Results Last 48 Hours  Results for orders  placed or performed during the hospital encounter of 04/16/18 (from the past 48 hour(s))  Hemoglobin A1c     Status: None    Collection Time: 04/18/18  4:13 AM  Result Value Ref Range    Hgb A1c MFr Bld 5.1 4.8 - 5.6 %      Comment: (NOTE) Pre diabetes:          5.7%-6.4% Diabetes:              >6.4% Glycemic control for   <7.0% adults with diabetes      Mean Plasma Glucose 99.67 mg/dL      Comment: Performed at [facility] [address], [place], [place] [address]  Lipid panel     Status: Abnormal    Collection Time: 04/18/18  4:13 AM  Result Value Ref Range    Cholesterol 104 0 - 200 mg/dL    Triglycerides 144 <150 mg/dL    HDL 15 (L) >40 mg/dL    Total CHOL/HDL Ratio 6.9 RATIO    VLDL 29 0 - 40 mg/dL    LDL Cholesterol 60 0 - 99 mg/dL      Comment:        Total Cholesterol/HDL:CHD Risk Coronary Heart Disease Risk Table                     Men   Women  1/2 Average Risk   3.4   3.3  Average Risk       5.0   4.4  2 X Average Risk   9.6   7.1  3 X Average Risk  23.4   11.0        Use the calculated Patient Ratio above and the CHD Risk Table to determine the patient's CHD Risk.        ATP III CLASSIFICATION (LDL):  <100     mg/dL   Optimal  100-129  mg/dL   Near or Above                    Optimal  130-159  mg/dL   Borderline  160-189  mg/dL   High  >190     mg/dL   Very High Performed at [facility], [place], [place] [address]         Imaging Results (Last 48 hours)  US Carotid Bilateral (at [facility] And [facility] Only)   Result Date: 04/17/2018 CLINICAL DATA:  Stroke. History of hypertension hyperlipidemia. Former smoker EXAM: BILATERAL CAROTID DUPLEX ULTRASOUND TECHNIQUE: Pearline Cables scale imaging, color Doppler and duplex ultrasound were performed of bilateral carotid and vertebral arteries in the neck. COMPARISON:  None. FINDINGS: Criteria: Quantification of carotid stenosis is based on velocity parameters that correlate the residual internal carotid diameter with NASCET-based stenosis levels, using the diameter of the distal internal carotid lumen as the denominator for stenosis measurement. The following velocity measurements were obtained: RIGHT ICA:  71/16 cm/sec CCA:  29/93 cm/sec SYSTOLIC ICA/CCA RATIO:  1.2 ECA:  57 cm/sec LEFT ICA:  65/9 cm/sec CCA:  71/69 cm/sec SYSTOLIC ICA/CCA RATIO:  1.0 ECA:  50 cm/sec RIGHT CAROTID ARTERY: There is a minimal amount mixed echogenic plaque with the mid (image 8) and distal (image 13) aspects of the right common carotid artery. There is a moderate amount of eccentric echogenic plaque within the right carotid bulb (images 16 and 18), extending to involve the origin and proximal aspects the right internal carotid artery (image 26),  not resulting in elevated peak systolic velocities within the interrogated course of the right internal carotid artery to suggest a hemodynamically significant stenosis. RIGHT VERTEBRAL ARTERY:  Antegrade flow LEFT CAROTID ARTERY: There is a moderate amount of echogenic shadowing plaque within the mid aspect of the left common carotid artery (image 44). There is a moderate amount of eccentric echogenic densely shadowing plaque within the left carotid bulb (images 51 and 53), extending to involve the origin and proximal aspects of the left internal carotid artery (image 62), not resulting in elevated peak systolic velocities within the interrogated  course of the left internal carotid artery to suggest a hemodynamically significant stenosis. LEFT VERTEBRAL ARTERY:  Antegrade flow IMPRESSION: Moderate amount of bilateral atherosclerotic plaque, right subjectively greater than left, not resulting in a hemodynamically significant stenosis within either internal carotid artery. Electronically Signed   By: Sandi Mariscal M.D.   On: 04/17/2018 12:09             Medical Problem List and Plan: 1.  Left hemiparesis secondary to Right frontal infarct cardioembolic stroke continue Eliquis PT OT eval's in a.m. 2.  DVT Prophylaxis/Anticoagulation: Pharmaceutical: Other (comment)--Eliquis 3. Pain Management: tylenol prn.  4. Mood: LCSW to follow for evaluation and support.  5. Neuropsych: This patient RIght frontal infarct capable of making decisions on her own behalf. 6. Skin/Wound Care: Routine pressure relief measures.  7. Fluids/Electrolytes/Nutrition: Monitor I/O. Check lytes in am.  8. CAF: Monitor HR bid. Controlled off medications. On Eliquis 9. Spinal stenosis with radiculopathy RLE:  Tylenol prn  10. HTN: Monitor BP bid--allow for adequate perfusion. Off metoprolol, amlodipine and losartan at this time.  11.  Seizure prophylaxis: On Keppra bid.  13. GERD/H/o dysphagia/Schatzik's ring s/p dilatation/esophageal spasms: Resume home Zantac.   14. Spasticity: Continue baclofen. Will change IV ativan to klonopin prn.    Post Admission Physician Evaluation: 1. Functional deficits secondary  to Right frontal infarct with left hemiparesis. 2. Patient admitted to receive collaborative, interdisciplinary care between the physiatrist, rehab nursing staff, and therapy team. 3. Patient's level of medical complexity and substantial therapy needs in context of that medical necessity cannot be provided at a lesser intensity of care. 4. Patient has experienced substantial functional loss from his/her baseline.   Judging by the patient's diagnosis, physical  exam, and functional history, the patient has potential for functional progress which will result in measurable gains while on inpatient rehab.  These gains will be of substantial and practical use upon discharge in facilitating mobility and self-care at the household level. 5. Physiatrist will provide 24 hour management of medical needs as well as oversight of the therapy plan/treatment and provide guidance as appropriate regarding the interaction of the two. 6. 24 hour rehab nursing will assist in the management of  bladder management, bowel management, safety, skin/wound care, disease management, medication administration, pain management and patient education  and help integrate therapy concepts, techniques,education, etc. 7. PT will assess and treat for:pre gait, gait training, endurance , safety, equipment, neuromuscular re education  .  Goals are: supervision. 8. OT will assess and treat for ADLs, Cognitive perceptual skills, Neuromuscular re education, safety, endurance, equipment  .  Goals are: supervision.  9. SLP will assess and treat for  .  Goals are: N/A. 10. Case Management and Social Worker will assess and treat for psychological issues and discharge planning. 11. Team conference will be held weekly to assess progress toward goals and to determine barriers to discharge. 12.  Patient will  receive at least 3 hours of therapy per day at least 5 days per week. 13. ELOS and Prognosis: 7-10d excellent      "I have personally performed a face to face diagnostic evaluation of this patient.  Additionally, I have reviewed and concur with the physician assistant's documentation above."  Charlett Blake M.D. [facility] FAAPM&R (Sports Med, Neuromuscular Med) Diplomate Am Board of [place], PA-C 04/19/2018

## 2018-04-20 ENCOUNTER — Inpatient Hospital Stay (HOSPITAL_COMMUNITY): Payer: Medicare Other | Admitting: Physical Therapy

## 2018-04-20 ENCOUNTER — Inpatient Hospital Stay (HOSPITAL_COMMUNITY): Payer: Medicare Other | Admitting: Occupational Therapy

## 2018-04-20 LAB — CBC WITH DIFFERENTIAL/PLATELET
Abs Immature Granulocytes: 0 10*3/uL (ref 0.0–0.1)
BASOS ABS: 0 10*3/uL (ref 0.0–0.1)
Basophils Relative: 1 %
EOS ABS: 0.2 10*3/uL (ref 0.0–0.7)
EOS PCT: 5 %
HCT: 35.7 % — ABNORMAL LOW (ref 36.0–46.0)
HEMOGLOBIN: 11.7 g/dL — AB (ref 12.0–15.0)
Immature Granulocytes: 1 %
LYMPHS PCT: 40 %
Lymphs Abs: 1.7 10*3/uL (ref 0.7–4.0)
MCH: 33.7 pg (ref 26.0–34.0)
MCHC: 32.8 g/dL (ref 30.0–36.0)
MCV: 102.9 fL — ABNORMAL HIGH (ref 78.0–100.0)
Monocytes Absolute: 0.8 10*3/uL (ref 0.1–1.0)
Monocytes Relative: 20 %
Neutro Abs: 1.4 10*3/uL — ABNORMAL LOW (ref 1.7–7.7)
Neutrophils Relative %: 33 %
Platelets: 110 10*3/uL — ABNORMAL LOW (ref 150–400)
RBC: 3.47 MIL/uL — AB (ref 3.87–5.11)
RDW: 13.7 % (ref 11.5–15.5)
WBC: 4.2 10*3/uL (ref 4.0–10.5)

## 2018-04-20 LAB — COMPREHENSIVE METABOLIC PANEL
ALBUMIN: 3.3 g/dL — AB (ref 3.5–5.0)
ALT: 18 U/L (ref 0–44)
ANION GAP: 8 (ref 5–15)
AST: 30 U/L (ref 15–41)
Alkaline Phosphatase: 32 U/L — ABNORMAL LOW (ref 38–126)
BUN: 5 mg/dL — ABNORMAL LOW (ref 8–23)
CHLORIDE: 109 mmol/L (ref 98–111)
CO2: 26 mmol/L (ref 22–32)
Calcium: 9.3 mg/dL (ref 8.9–10.3)
Creatinine, Ser: 0.72 mg/dL (ref 0.44–1.00)
GFR calc Af Amer: >60 mL/min (ref >60)
GFR calc non Af Amer: >60 mL/min (ref >60)
GLUCOSE: 116 mg/dL — AB (ref 70–99)
Potassium: 3.5 mmol/L (ref 3.5–5.1)
SODIUM: 143 mmol/L (ref 135–145)
Total Bilirubin: 1.1 mg/dL (ref 0.3–1.2)
Total Protein: 5.3 g/dL — ABNORMAL LOW (ref 6.5–8.1)

## 2018-04-20 LAB — GLUCOSE, CAPILLARY
GLUCOSE-CAPILLARY: 103 mg/dL — AB (ref 70–99)
GLUCOSE-CAPILLARY: 106 mg/dL — AB (ref 70–99)
GLUCOSE-CAPILLARY: 123 mg/dL — AB (ref 70–99)
Glucose-Capillary: 113 mg/dL — ABNORMAL HIGH (ref 70–99)

## 2018-04-20 LAB — VITAMIN B12: Vitamin B-12: 939 pg/mL — ABNORMAL HIGH (ref 180–914)

## 2018-04-20 MED ORDER — METOPROLOL TARTRATE 12.5 MG HALF TABLET
12.5000 mg | ORAL_TABLET | Freq: Two times a day (BID) | ORAL | Status: DC
Start: 2018-04-20 — End: 2018-04-28
  Administered 2018-04-20 – 2018-04-28 (×17): 12.5 mg via ORAL
  Filled 2018-04-20 (×15): qty 1

## 2018-04-20 MED ORDER — ACETAMINOPHEN 325 MG PO TABS: 325.0000 mg | ORAL_TABLET | ORAL | Status: DC | PRN

## 2018-04-20 MED ORDER — SENNOSIDES-DOCUSATE SODIUM 8.6-50 MG PO TABS
2.0000 | ORAL_TABLET | Freq: Every evening | ORAL | Status: DC | PRN
  Filled 2018-04-20: qty 2

## 2018-04-20 NOTE — Evaluation (Signed)
Physical Therapy Assessment and Plan  Patient Details  Name: Isabel Hernandez MRN: [id number] Date of Birth: 09/04/1941  PT Diagnosis: Abnormality of gait, Coordination disorder, Difficulty walking, Hemiplegia dominant, Impaired sensation, Muscle spasms and Muscle weakness Rehab Potential: Excellent ELOS: 10-14 days   Today's Date: 04/20/2018 PT Individual Time: 6203-5597 AND 1415-1526 PT Individual Time Calculation (min): 55 min AND 71 min  Problem List:  Patient Active Problem List   Diagnosis Date Noted  . Ischemic stroke of frontal lobe ([place]) 04/19/2018  . Right hemiparesis ([place])   . Chronic atrial fibrillation ([place])   . Gait disturbance, post-stroke   . UTI (urinary tract infection) 04/17/2018  . Acute CVA (cerebrovascular accident) ([place]) 04/17/2018  . Hip flexor tendinitis 09/08/2017  . 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 Hand or foot spasms 05/09/2017  . Tremor 05/09/2017  . Sinusitis 04/28/2017  . Lumbar spinal stenosis 03/01/2017  . Lumbar radiculopathy, right 01/30/2017  . Right hip pain 08/19/2016  . Pneumonitis 05/19/2016  . GERD (gastroesophageal reflux disease) 05/19/2016  . NSAID long-term use 03/22/2016  . Primary osteoarthritis of left knee 03/22/2016  . Primary osteoarthritis of right hand 03/22/2016  . Rheumatoid factor positive 03/22/2016  . Essential hypertension 02/17/2016  . Carotid stenosis 02/17/2016  . Abnormal chest x-ray 02/17/2016  . Hyperlipidemia 02/17/2016  . Allergic rhinitis 02/09/2016    Past Medical History:  Past Medical History:  Diagnosis Date  . A-fib ([place])   . Arthritis   . Breast cancer ([place]) 2002   RT LUMPECTOMY  . Bronchitis   . Carotid artery narrowings   . 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 Carotid artery occlusion    RIGHT 50% BLOCKAGE  . Colon polyps   . Complication of anesthesia    shaking/ FOLLOWING LOCAL AT DENTIST , drop O2 sats TO 50% DURING COLONOSCOPY  . Coronary artery disease   .  Diverticulosis   . Dysrhythmia   . Environmental allergies   . GERD (gastroesophageal reflux disease)   . History of hiatal hernia   . Hyperlipidemia   . Hypertension   . IBS (irritable bowel syndrome)   . Mitral valve disorder   . Shortness of breath dyspnea   . Syncope and collapse   . UTI (lower urinary tract infection)    Past Surgical History:  Past Surgical History:  Procedure Laterality Date  . APPENDECTOMY    . BREAST BIOPSY Right 2002   POS  . BREAST CYST ASPIRATION Right   . BREAST EXCISIONAL BIOPSY Left 1997   NEG  . BREAST LUMPECTOMY    . CATARACT EXTRACTION W/PHACO Right 06/07/2016   Procedure: CATARACT EXTRACTION PHACO AND INTRAOCULAR LENS PLACEMENT (IOC);  Surgeon: Birder Robson, MD;  Location: [facility] ORS;  Service: Ophthalmology;  Laterality: Right;  [place] 00:51 AP% 19.6 CDE 10.09 Fluid pack lot # [id number]  . CATARACT EXTRACTION W/PHACO Left 07/05/2016   Procedure: CATARACT EXTRACTION PHACO AND INTRAOCULAR LENS PLACEMENT (IOC);  Surgeon: Birder Robson, MD;  Location: [facility] ORS;  Service: Ophthalmology;  Laterality: Left;  [place] 00:38 AP% 24.9 CDE 9.60 Fluid Pack lot # [id number]  . CESAREAN SECTION    . TONSILLECTOMY    . TOTAL KNEE ARTHROPLASTY Left     Assessment & Plan Clinical Impression: Patient is a 77 year old female with history of HTN, CAS, A fib, Lumbar stenosis with radiculopathy who was admitted to [facility] on 04/17/18 with numbness and jerking movements of RUE and RLE. MRI/MRA brain done revealing  left posteromedial frontal lobe infarct including precentral gyrus and increased diffusion left posterior frontal white matter probably due to subacute infarct or question acute inflammatory or demyelinating lesion---4-6 week follow up recommended to assess change. No carotid or vertebral stenosis. 2D echo showed EF 65-70% with calcified mitral valve and mild LVH. Carotid dopplers showed moderate bilateral atherosclerotic plaque R >L without significant stenosis.  She was placed on Eliquis for embolic stroke and started on Keppra to help manage spasms/jerks. Baclofen added with improvement in spasms but she continues to have significant impairments in mobility as well as ability to carry out ADLs CIR recommended due to functional deficits. Patient transferred to CIR on 04/19/2018 .   Patient currently requires mod with mobility secondary to muscle weakness, decreased cardiorespiratoy endurance, impaired timing and sequencing, unbalanced muscle activation and decreased coordination and decreased standing balance, hemiplegia and decreased balance strategies.  Prior to hospitalization, patient was independent  with mobility and lived with Spouse in a Other(Comment)([facility] community) home.  Home access is  Level entry.  Patient will benefit from skilled PT intervention to maximize safe functional mobility, minimize fall risk and decrease caregiver burden for planned discharge home with 24 hour supervision.  Anticipate patient will benefit from follow up [place] at discharge.  PT - End of Session Activity Tolerance: Tolerates < 10 min activity, no significant change in vital signs Endurance Deficit: Yes Endurance Deficit Description: increased work of breathing w/ all mobility, requires prolong rest break to recover PT Assessment Rehab Potential (ACUTE/IP ONLY): Excellent PT Patient demonstrates impairments in the following area(s): Balance;Endurance;Motor;Pain;Safety;Sensory PT Transfers Functional Problem(s): Bed Mobility;Bed to Chair;Car;Furniture;Floor PT Locomotion Functional Problem(s): Ambulation;Wheelchair Mobility;Stairs PT Plan PT Intensity: Minimum of 1-2 x/day ,45 to 90 minutes PT Frequency: 5 out of 7 days PT Duration Estimated Length of Stay: 10-14 days PT Treatment/Interventions: Ambulation/gait training;Stair training;Pain management;Disease management/prevention;Visual/perceptual remediation/compensation;Wheelchair  propulsion/positioning;Therapeutic Activities;Patient/family education;DME/adaptive equipment instruction;Balance/vestibular training;Psychosocial support;Cognitive remediation/compensation;Functional electrical stimulation;Therapeutic Exercise;UE/LE Strength taining/ROM;Skin care/wound management;Functional mobility training;Community reintegration;Discharge planning;Neuromuscular re-education;Splinting/orthotics;UE/LE Coordination activities PT Transfers Anticipated Outcome(s): Mod I  PT Locomotion Anticipated Outcome(s): Supervision household gait PT Recommendation Follow Up Recommendations: Home health PT;Outpatient PT(OPPT of husband is able to safely drive her) Patient destination: Home(Independent part of retirement community) Equipment Recommended: To be determined  Skilled Therapeutic Intervention  Session 1: Pt in supine and agreeable to therapy, denies pain. Pt instructed patient in PT Evaluation and initiated treatment intervention; see below for results. Specifically performed gait, transfers, and w/c mobility this session. Required frequent rest breaks 2/2 fatigue and max encouragement on the abilities she does have, she was tearful throughout session. Pt educated patient in [place], rehab potential, rehab goals, and discharge recommendations. Returned to room and ended session in supine, call bell within reach and all needs met.   Session 2:  Pt toileting and agreeable to therapy, denies pain. Assisted w/ transfer back to w/c via stand pivot, mod assist. Pt self-propelled w/c around unit for global endurance and strengthening, >150' at a time. Performed NuStep 10 min @ level 3 for LE strengthening and reciprocal movement pattern. Performed standing RLE NMR tasks w/ RW support including lateral weight shifts, L forward and backward stepping, partial knee bends, and R hip flexion. Min tactile and verbal cues for R quad activation. Frequent seated rest breaks 2/2 fatigue, but did not require as  prolonged of a rest break as this morning. Pt then ambulated 40' w/ min assist and frequent verbal cues for R quad activation in stance and for bilateral foot  clearance. Bout of gait much improved from morning session, pt w/ more confidence in RLE to support her. Educated pt on stroke risk factors and future stroke prevention. Provided w/ booklet, pt appreciative and expressed desire to prevent another stroke from occurring. Returned to room and ended session in w/c, call bell within reach and all needs met. Chair alarm engaged for safety.   PT Evaluation Precautions/Restrictions Precautions Precautions: Fall Restrictions Weight Bearing Restrictions: No Pain Pain Assessment Pain Scale: 0-10 Pain Score: 0-No pain Pain Location: Back Pain Orientation: Lower Pain Descriptors / Indicators: Aching(states having a bulging disc) Pain Onset: On-going Patients Stated Pain Goal: 2 Pain Intervention(s): Refused Home Living/Prior Functioning Home Living Available Help at Discharge: Family;Available 24 hours/day Type of Home: Other(Comment)(cottage at retirement community) Home Access: Level entry Home Layout: One level Bathroom Accessibility: Yes  Lives With: Spouse Prior Function Level of Independence: Independent with basic ADLs;Independent with transfers;Independent with homemaking with ambulation;Independent with gait  Able to Take Stairs?: Yes Driving: Yes 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, 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 as well  Leisure: Hobbies-yes (Comment) Comments: Drives, runs errands, active in recreation activities at retirement community Vision/Perception  Perception Perception: Within Functional Limits Praxis Praxis: Intact  Cognition Overall Cognitive Status: Within Functional Limits for tasks assessed Arousal/Alertness: Awake/alert Orientation Level: Oriented X4 Memory: Appears intact Awareness: Appears intact Problem Solving: Appears  intact Safety/Judgment: Appears intact Sensation Sensation Light Touch: Impaired by gross assessment Additional Comments: reports numbness and tingling throughout RLE intermittently Coordination Gross Motor Movements are Fluid and Coordinated: No Fine Motor Movements are Fluid and Coordinated: No Coordination and Movement Description: Intermittent tremors in RUE and BLEs, able to resolve w/ weight bearing 50% of the time Finger Nose Finger Test: Impaired RUE>LUE, slow and undershoots Heel Shin Test: LLE WFL, RLE impaired Motor  Motor Motor: Hemiplegia;Motor perseverations Motor - Skilled Clinical Observations: Tremors in BLEs and RUE, R hemi  Mobility Bed Mobility Bed Mobility: Rolling Right;Rolling Left;Supine to Sit;Sit to Supine Rolling Right: Supervision/verbal cueing Rolling Left: Supervision/Verbal cueing Supine to Sit: Minimal Assistance - Patient > 75% Sit to Supine: Minimal Assistance - Patient > 75% Transfers Transfers: Stand to Sit;Sit to Stand;Stand Pivot Transfers Sit to Stand: Minimal Assistance - Patient > 75% Stand to Sit: Minimal Assistance - Patient > 75% Stand Pivot Transfers: Moderate Assistance - Patient 50 - 74% Stand Pivot Transfer Details: Manual facilitation for weight bearing;Manual facilitation for weight shifting;Manual facilitation for placement;Verbal cues for technique;Tactile cues for initiation;Verbal cues for sequencing Transfer (Assistive device): 1 person hand held assist Locomotion  Gait Ambulation: Yes Gait Assistance: Minimal Assistance - Patient > 75% Gait Distance (Feet): 10 Feet Assistive device: Rolling walker Gait Assistance Details: Manual facilitation for weight bearing;Manual facilitation for weight shifting;Verbal cues for safe use of DME/AE;Verbal cues for sequencing;Verbal cues for technique Gait Assistance Details: Hands-on assist for safety and pt confidence, otherwise only providing assist for RW management and lateral weight  shifting Gait Gait: Yes Gait Pattern: Impaired Gait Pattern: Poor foot clearance - right;Lateral hip instability;Shuffle;Right foot flat;Decreased stance time - left Gait velocity: very slow and cautious Wheelchair Mobility Wheelchair Mobility: Yes Wheelchair Assistance: Chartered loss adjuster: Both upper extremities Wheelchair Parts Management: Needs assistance Distance: 150'  Trunk/Postural Assessment  Cervical Assessment Cervical Assessment: Exceptions to WFL(forward head, rounded shoulder posture) Thoracic Assessment Thoracic Assessment: Within Functional Limits Lumbar Assessment Lumbar Assessment: Exceptions to WFL(posterior pelvic tilt, pt w/ hx of herniated discs per her report) Postural Control Postural Control: Within Functional Limits  Balance  Balance Balance Assessed: Yes Static Sitting Balance Static Sitting - Balance Support: No upper extremity supported;Feet supported Static Sitting - Level of Assistance: 5: Stand by assistance Dynamic Sitting Balance Dynamic Sitting - Balance Support: No upper extremity supported;Feet supported Dynamic Sitting - Level of Assistance: 5: Stand by assistance Static Standing Balance Static Standing - Balance Support: No upper extremity supported;During functional activity Static Standing - Level of Assistance: 4: Min assist Dynamic Standing Balance Dynamic Standing - Balance Support: No upper extremity supported;During functional activity Dynamic Standing - Level of Assistance: 3: Mod assist Extremity Assessment  RLE Assessment RLE Assessment: Exceptions to [facility] Passive Range of Motion (PROM) Comments: WFL General Strength Comments: 2+ to 3/5 globally LLE Assessment LLE Assessment: Within Functional Limits   See Function Navigator for Current Functional Status.   Refer to Care Plan for Long Term Goals  Recommendations for other services: None   Discharge Criteria: Patient will be discharged from  PT if patient refuses treatment 3 consecutive times without medical reason, if treatment goals not met, if there is a change in medical status, if patient makes no progress towards goals or if patient is discharged from hospital.  The above assessment, treatment plan, treatment alternatives and goals were discussed and mutually agreed upon: by patient  Lanett Lasorsa K Arnette 04/20/2018, 9:19 AM

## 2018-04-20 NOTE — Care Management Note (Signed)
[facility] Individual Statement of Services  Patient Name:  Isabel Hernandez  Date:  04/20/2018  Welcome to the [facility].  Our goal is to provide you with an individualized program based on your diagnosis and situation, designed to meet your specific needs.  With this comprehensive rehabilitation program, you will be expected to participate in at least 3 hours of rehabilitation therapies Monday-Friday, with modified therapy programming on the weekends.  Your rehabilitation program will include the following services:  Physical Therapy (PT), Occupational Therapy (OT), Speech Therapy (ST), 24 hour per day rehabilitation nursing, Therapeutic Recreaction (TR), Case Management (Social Worker), Rehabilitation Medicine, Nutrition Services and Pharmacy Services  Weekly team conferences will be held on Wednesday to discuss your progress.  Your Social Worker will talk with you frequently to get your input and to update you on team discussions.  Team conferences with you and your family in attendance may also be held.  Expected length of stay: 10-14 days Overall anticipated outcome: supervision level-some mod/i level  Depending on your progress and recovery, your program may change. Your Social Worker will coordinate services and will keep you informed of any changes. Your Social Worker's name and contact numbers are listed  below.  The following services may also be recommended but are not provided by the [facility] will be made to provide these services after discharge if needed.  Arrangements include referral to agencies that provide these services.  Your insurance has been verified to be:  [place] Your primary doctor is:  Glendon Axe  Pertinent information will be shared with your doctor and your insurance  company.  Social Worker:  Ovidio Kin, [place] or ([phone]  Information discussed with and copy given to patient by: Elease Hashimoto, 04/20/2018, 9:19 AM

## 2018-04-20 NOTE — Plan of Care (Signed)
  Problem: Consults Goal: RH STROKE PATIENT EDUCATION Description See Patient Education module for education specifics  Outcome: Progressing   Problem: RH BOWEL ELIMINATION Goal: RH STG MANAGE BOWEL WITH ASSISTANCE Description STG Manage Bowel with Assistance. Outcome: Progressing Goal: RH STG MANAGE BOWEL W/MEDICATION W/ASSISTANCE Description STG Manage Bowel with Medication with Assistance. Outcome: Progressing Goal: RH STG MANAGE BOWEL W/EQUIPMENT W/ASSISTANCE Description STG Manage Bowel With Equipment With Assistance Outcome: Progressing Goal: RH OTHER STG BOWEL ELIMINATION GOALS W/ASSIST Description Other STG Bowel Elimination Goals With Assistance. Outcome: Progressing

## 2018-04-20 NOTE — Progress Notes (Signed)
Subjective/Complaints: Good BM after senna Tremor this am in BUE and BLE ROS- no CP, SOB, N/V/D Objective: Vital Signs: Blood pressure (!) 152/92, pulse 73, temperature (!) 97.4 F (36.3 C), resp. rate 18, height 5' 4"  (1.626 m), weight 73.5 kg (162 lb 0.6 oz), SpO2 96 %. No results found. Results for orders placed or performed during the hospital encounter of 04/19/18 (from the past 72 hour(s))  Glucose, capillary     Status: None   Collection Time: 04/19/18 12:47 PM  Result Value Ref Range   Glucose-Capillary 99 70 - 99 mg/dL  Glucose, capillary     Status: Abnormal   Collection Time: 04/19/18  4:50 PM  Result Value Ref Range   Glucose-Capillary 105 (H) 70 - 99 mg/dL  Glucose, capillary     Status: Abnormal   Collection Time: 04/19/18  9:12 PM  Result Value Ref Range   Glucose-Capillary 114 (H) 70 - 99 mg/dL  CBC WITH DIFFERENTIAL     Status: Abnormal   Collection Time: 04/20/18  5:27 AM  Result Value Ref Range   WBC 4.2 4.0 - 10.5 K/uL   RBC 3.47 (L) 3.87 - 5.11 MIL/uL   Hemoglobin 11.7 (L) 12.0 - 15.0 g/dL   HCT 35.7 (L) 36.0 - 46.0 %   MCV 102.9 (H) 78.0 - 100.0 fL   MCH 33.7 26.0 - 34.0 pg   MCHC 32.8 30.0 - 36.0 g/dL   RDW 13.7 11.5 - 15.5 %   Platelets 110 (L) 150 - 400 K/uL    Comment: SPECIMEN CHECKED FOR CLOTS REPEATED TO VERIFY PLATELET COUNT CONFIRMED BY SMEAR    Neutrophils Relative % 33 %   Neutro Abs 1.4 (L) 1.7 - 7.7 K/uL   Lymphocytes Relative 40 %   Lymphs Abs 1.7 0.7 - 4.0 K/uL   Monocytes Relative 20 %   Monocytes Absolute 0.8 0.1 - 1.0 K/uL   Eosinophils Relative 5 %   Eosinophils Absolute 0.2 0.0 - 0.7 K/uL   Basophils Relative 1 %   Basophils Absolute 0.0 0.0 - 0.1 K/uL   Immature Granulocytes 1 %   Abs Immature Granulocytes 0.0 0.0 - 0.1 K/uL    Comment: Performed at [facility], [address], [place], [place] [address]  Comprehensive metabolic panel     Status: Abnormal   Collection Time: 04/20/18  5:27 AM  Result Value Ref  Range   Sodium 143 135 - 145 mmol/L   Potassium 3.5 3.5 - 5.1 mmol/L   Chloride 109 98 - 111 mmol/L    Comment: Please note change in reference range.   CO2 26 22 - 32 mmol/L   Glucose, Bld 116 (H) 70 - 99 mg/dL    Comment: Please note change in reference range.   BUN 5 (L) 8 - 23 mg/dL    Comment: Please note change in reference range.   Creatinine, Ser 0.72 0.44 - 1.00 mg/dL   Calcium 9.3 8.9 - 10.3 mg/dL   Total Protein 5.3 (L) 6.5 - 8.1 g/dL   Albumin 3.3 (L) 3.5 - 5.0 g/dL   AST 30 15 - 41 U/L   ALT 18 0 - 44 U/L    Comment: Please note change in reference range.   Alkaline Phosphatase 32 (L) 38 - 126 U/L   Total Bilirubin 1.1 0.3 - 1.2 mg/dL   GFR calc non Af Amer >60 >60 mL/min   GFR calc Af Amer >60 >60 mL/min    Comment: (NOTE) The eGFR  has been calculated using the CKD EPI equation. This calculation has not been validated in all clinical situations. eGFR's persistently <60 mL/min signify possible Chronic Kidney Disease.    Anion gap 8 5 - 15    Comment: Performed at [facility] [address], [place], [place] [address]  Glucose, capillary     Status: Abnormal   Collection Time: 04/20/18  6:43 AM  Result Value Ref Range   Glucose-Capillary 113 (H) 70 - 99 mg/dL     HEENT: normal Cardio: RRR and no murmur Resp: CTA B/L and unlabored GI: reduced BS, NT, ND Extremity:  No Edema Skin:   Other no incisional drainage Neuro: Alert/Oriented, Normal Sensory and Abnormal Motor 5/5 in BUE, 4/5 in BLE Musc/Skel:  Other no pain with UE or LE ROM GEN NAD   Assessment/Plan: 1. Functional deficits secondary to RIght frontal infarct which require 3+ hours per day of interdisciplinary therapy in a comprehensive inpatient rehab setting. Physiatrist is providing close team supervision and 24 hour management of active medical problems listed below. Physiatrist and rehab team continue to assess barriers to discharge/monitor patient progress toward functional and medical  goals. FIM:                                  Medical Problem List and Plan: 1. 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 DVT Prophylaxis/Anticoagulation: Pharmaceutical:Other (comment)--Eliquis 3. Pain Management:tylenol prn. 4. Mood:LCSW to follow for evaluation and support. 5. Neuropsych: This patient RIght frontal infarct capable of making decisions on her own behalf. 6. Skin/Wound Care:Routine pressure relief measures. 7. Fluids/Electrolytes/Nutrition:Monitor I/O. Check lytes in am.  8. CAF: Monitor HR bid. Controlled off medications. On Eliquis Vitals:   04/19/18 2003 04/20/18 0426  BP: (!) 148/87 (!) 152/92  Pulse: 80 73  Resp: 17 18  Temp: 97.9 F (36.6 C) (!) 97.4 F (36.3 C)  SpO2: 93% 96%  was on Lopressor PTA, may resume low dose, this may help tremor 9. Spinal stenosis with radiculopathy RLE: Tylenol prn  10. HTN: Monitor BP bid--allow for adequate perfusion. Off metoprolol, amlodipine and losartan at this time.  11. Seizure prophylaxis: On Keppra bid. No documented seizures   13.GERD/H/o dysphagia/Schatzik's ring s/p dilatation/esophageal spasms:Resume home Zantac. 14. ?Spasticity: Continue baclofen. Will change IV ativan to klonopin prn.So far has had tremor but not seizure, may wean baclofen  LOS (Days) 1 A FACE TO FACE EVALUATION WAS PERFORMED  Charlett Blake 04/20/2018, 8:41 AM

## 2018-04-20 NOTE — Progress Notes (Signed)
Social Work  Social Work Assessment and Plan  Patient Details  Name: Isabel Hernandez MRN: [id number] Date of Birth: [birth year]  Today's Date: 04/20/2018  Problem List:  Patient Active Problem List   Diagnosis Date Noted  . Ischemic stroke of frontal lobe ([place]) 04/19/2018  . Right hemiparesis ([place])   . Chronic atrial fibrillation ([place])   . Gait disturbance, post-stroke   . UTI (urinary tract infection) 04/17/2018  . Acute CVA (cerebrovascular accident) ([place]) 04/17/2018  . Hip flexor tendinitis 09/08/2017  . 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 Hand or foot spasms 05/09/2017  . Tremor 05/09/2017  . Sinusitis 04/28/2017  . Lumbar spinal stenosis 03/01/2017  . Lumbar radiculopathy, right 01/30/2017  . Right hip pain 08/19/2016  . Pneumonitis 05/19/2016  . GERD (gastroesophageal reflux disease) 05/19/2016  . NSAID long-term use 03/22/2016  . Primary osteoarthritis of left knee 03/22/2016  . Primary osteoarthritis of right hand 03/22/2016  . Rheumatoid factor positive 03/22/2016  . Essential hypertension 02/17/2016  . Carotid stenosis 02/17/2016  . Abnormal chest x-ray 02/17/2016  . Hyperlipidemia 02/17/2016  . Allergic rhinitis 02/09/2016   Past Medical History:  Past Medical History:  Diagnosis Date  . A-fib ([place])   . Arthritis   . Breast cancer ([place]) 2002   RT LUMPECTOMY  . Bronchitis   . Carotid artery narrowings   . 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 Carotid artery occlusion    RIGHT 50% BLOCKAGE  . Colon polyps   . Complication of anesthesia    shaking/ FOLLOWING LOCAL AT DENTIST , drop O2 sats TO 50% DURING COLONOSCOPY  . Coronary artery disease   . Diverticulosis   . Dysrhythmia   . Environmental allergies   . GERD (gastroesophageal reflux disease)   . History of hiatal hernia   . Hyperlipidemia   . Hypertension   . IBS (irritable bowel syndrome)   . Mitral valve disorder   . Shortness of breath dyspnea   . Syncope and collapse   . UTI  (lower urinary tract infection)    Past Surgical History:  Past Surgical History:  Procedure Laterality Date  . APPENDECTOMY    . BREAST BIOPSY Right 2002   POS  . BREAST CYST ASPIRATION Right   . BREAST EXCISIONAL BIOPSY Left 1997   NEG  . BREAST LUMPECTOMY    . CATARACT EXTRACTION W/PHACO Right 06/07/2016   Procedure: CATARACT EXTRACTION PHACO AND INTRAOCULAR LENS PLACEMENT (IOC);  Surgeon: Birder Robson, MD;  Location: [facility] ORS;  Service: Ophthalmology;  Laterality: Right;  [place] 00:51 AP% 19.6 CDE 10.09 Fluid pack lot # [id number]  . CATARACT EXTRACTION W/PHACO Left 07/05/2016   Procedure: CATARACT EXTRACTION PHACO AND INTRAOCULAR LENS PLACEMENT (IOC);  Surgeon: Birder Robson, MD;  Location: [facility] ORS;  Service: Ophthalmology;  Laterality: Left;  [place] 00:38 AP% 24.9 CDE 9.60 Fluid Pack lot # [id number]  . CESAREAN SECTION    . TONSILLECTOMY    . TOTAL KNEE ARTHROPLASTY Left    Social History:  reports that she quit smoking about 48 years ago. Her smoking use included cigarettes. She quit after 10.00 years of use. She has never used smokeless tobacco. She reports that she drinks alcohol. She reports that she does not use drugs.  Family / Support Systems Marital Status: Married Patient Roles: Spouse, Parent, Volunteer Spouse/Significant Other: Philip [phone] Children: Two children one in [place] and one in [place] supportive Other Supports: Friends Anticipated Caregiver: husband Ability/Limitations of Caregiver: Has an arm  issue but can assist according to pt Caregiver Availability: 24/7 Family Dynamics: Close knit family who are supportive and involved with one another. They have friends and church members at [employer] and feel they have good supports.  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 Will make sure husband is involved.   Abuse/Neglect Abuse/Neglect Assessment Can Be Completed: Yes Physical Abuse: Denies Verbal Abuse: Denies Sexual Abuse: Denies Exploitation of patient/patient's resources: Denies Self-Neglect: Denies  Emotional Status Pt's affect, behavior adn adjustment status: Pt is motivated to do well and has made much progress since first coming into the hospital. She is hopeful she will continue to do well and reach her independent level before leaving the rehab unit. Her hsuband is supportive and will assist if needed. Recent Psychosocial Issues: other health issues managed by her PCP Pyschiatric History: No history deferred depression screen due to pt is doing well and adjusting to the new unit. Will see from team if woud benefit from seeing neuro-psych feel she probably would. Substance Abuse History: No issues  Patient / Family Perceptions, Expectations & Goals Pt/Family understanding of illness & functional limitations: Pt and husband can explain her stroke and tremors. They talk with the MD daily and feel their questions and concerns are being addressed. Both feel fortunate she is doing so well and hopeful this will continue while on rehab. 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 Anticipated changes in roles/activities/participation: resume Pt/family expectations/goals: Pt states: " I want to be able to take care of myself before I leave here."  Husband states: " I will do whatever she needs done but know she would rather do it herself."  [employer]: Other (Comment)(Lives at [facility]) Premorbid Home Care/DME Agencies: None Transportation available at discharge: Husband Resource referrals recommended: Support group (specify)  Discharge  Planning Living Arrangements: Spouse/significant other Support Systems: Spouse/significant other, Children, Friends/neighbors, Church/faith community Type of Residence: Private residence [address] 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 Do have access to housekeeping and meals if needed. Husband is able to assist if needed and will be involved while she is here. Await therapy team evaluations and work on discharge needs. Social Work Anticipated Follow Up Needs: HH/OP, Support Group  Clinical Impression Pleasant female who is motivated to do well and recover from this stroke. Her husband is involved and willing to assist her. Pt is recovering quickly form this stroke and is very fortunante. Will work on discharge needs and await therapy team evaluations. May benefit from neuro-psych will get input from team.  Elease Hashimoto 04/20/2018, 11:44 AM

## 2018-04-20 NOTE — Plan of Care (Signed)
  Problem: Consults Goal: RH STROKE PATIENT EDUCATION Description: See Patient Education module for education specifics  Outcome: Progressing   

## 2018-04-20 NOTE — Evaluation (Signed)
Occupational Therapy Assessment and Plan  Patient Details  Name: Isabel Hernandez MRN: [id number] Date of Birth: 09-Jul-1941  OT Diagnosis: abnormal posture, ataxia, hemiplegia affecting dominant side and muscle weakness (generalized) Rehab Potential: Rehab Potential (ACUTE ONLY): Good ELOS: 10-14 days   Today's Date: 04/20/2018 OT Individual Time: 1045-1200 OT Individual Time Calculation (min): 75 min     Problem List:  Patient Active Problem List   Diagnosis Date Noted  . Ischemic stroke of frontal lobe ([place]) 04/19/2018  . Right hemiparesis ([place])   . Chronic atrial fibrillation ([place])   . Gait disturbance, post-stroke   . UTI (urinary tract infection) 04/17/2018  . Acute CVA (cerebrovascular accident) ([place]) 04/17/2018  . Hip flexor tendinitis 09/08/2017  . 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 Hand or foot spasms 05/09/2017  . Tremor 05/09/2017  . Sinusitis 04/28/2017  . Lumbar spinal stenosis 03/01/2017  . Lumbar radiculopathy, right 01/30/2017  . Right hip pain 08/19/2016  . Pneumonitis 05/19/2016  . GERD (gastroesophageal reflux disease) 05/19/2016  . NSAID long-term use 03/22/2016  . Primary osteoarthritis of left knee 03/22/2016  . Primary osteoarthritis of right hand 03/22/2016  . Rheumatoid factor positive 03/22/2016  . Essential hypertension 02/17/2016  . Carotid stenosis 02/17/2016  . Abnormal chest x-ray 02/17/2016  . Hyperlipidemia 02/17/2016  . Allergic rhinitis 02/09/2016    Past Medical History:  Past Medical History:  Diagnosis Date  . A-fib ([place])   . Arthritis   . Breast cancer ([place]) 2002   RT LUMPECTOMY  . Bronchitis   . Carotid artery narrowings   . 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 Carotid artery occlusion    RIGHT 50% BLOCKAGE  . Colon polyps   . Complication of anesthesia    shaking/ FOLLOWING LOCAL AT DENTIST , drop O2 sats TO 50% DURING COLONOSCOPY  . Coronary artery disease   . Diverticulosis   . Dysrhythmia   .  Environmental allergies   . GERD (gastroesophageal reflux disease)   . History of hiatal hernia   . Hyperlipidemia   . Hypertension   . IBS (irritable bowel syndrome)   . Mitral valve disorder   . Shortness of breath dyspnea   . Syncope and collapse   . UTI (lower urinary tract infection)    Past Surgical History:  Past Surgical History:  Procedure Laterality Date  . APPENDECTOMY    . BREAST BIOPSY Right 2002   POS  . BREAST CYST ASPIRATION Right   . BREAST EXCISIONAL BIOPSY Left 1997   NEG  . BREAST LUMPECTOMY    . CATARACT EXTRACTION W/PHACO Right 06/07/2016   Procedure: CATARACT EXTRACTION PHACO AND INTRAOCULAR LENS PLACEMENT (IOC);  Surgeon: Birder Robson, MD;  Location: [facility] ORS;  Service: Ophthalmology;  Laterality: Right;  [place] 00:51 AP% 19.6 CDE 10.09 Fluid pack lot # [id number]  . CATARACT EXTRACTION W/PHACO Left 07/05/2016   Procedure: CATARACT EXTRACTION PHACO AND INTRAOCULAR LENS PLACEMENT (IOC);  Surgeon: Birder Robson, MD;  Location: [facility] ORS;  Service: Ophthalmology;  Laterality: Left;  [place] 00:38 AP% 24.9 CDE 9.60 Fluid Pack lot # [id number]  . CESAREAN SECTION    . TONSILLECTOMY    . TOTAL KNEE ARTHROPLASTY Left     Assessment & Plan Clinical Impression: Isabel Hernandez is a 77 year old female with history of HTN, CAS, A fib, Lumbar stenosis with radiculopathy who was admitted to [facility] on 04/17[facility]18 with numbness and jerking movements of RUE and RLE. MRI/MRA brain done revealing left posteromedial  frontal lobe infarct including precentral gyrus and increased diffusion left posterior frontal white matter probably due to subacute infarct or question acute inflammatory or demyelinating lesion---4-6 week follow up recommended to assess change. No carotid or vertebral stenosis. 2D echo showed EF 65-70% with calcified mitral valve and mild LVH. Carotid dopplers showed moderate bilateral atherosclerotic plaque R >L without significant stenosis. She was placed on Eliquis for  embolic stroke and started on Keppra to help manage spasms/jerks. Baclofen added with improvement in spasms but she continues to have significant impairments in mobility as well as ability to carry out ADLs CIR recommended due to functional deficits. Patient transferred to CIR on 04/19/2018 .    Patient currently requires mod with basic self-care skills secondary to muscle weakness, decreased cardiorespiratoy endurance, ataxia and decreased coordination and decreased sitting balance, decreased standing balance, decreased postural control, hemiplegia and decreased balance strategies.  Prior to hospitalization, patient could complete ADLs/IADLs with independent .  Patient will benefit from skilled intervention to decrease level of assist with basic self-care skills and increase independence with basic self-care skills prior to discharge home with care partner.  Anticipate patient will require 24 hour supervision and follow up home health.  OT - End of Session Activity Tolerance: Tolerates 10 - 20 min activity with multiple rests Endurance Deficit: Yes OT Assessment Rehab Potential (ACUTE ONLY): Good OT Patient demonstrates impairments in the following area(s): Balance;Pain;Safety;Sensory;Endurance;Motor OT Basic ADL's Functional Problem(s): Eating;Grooming;Dressing;Toileting;Bathing OT Advanced ADL's Functional Problem(s): Simple Meal Preparation OT Transfers Functional Problem(s): Toilet;Tub/Shower OT Additional Impairment(s): Fuctional Use of Upper Extremity OT Plan OT Intensity: Minimum of 1-2 x/day, 45 to 90 minutes OT Frequency: 5 out of 7 days OT Duration/Estimated Length of Stay: 10-14 days OT Treatment/Interventions: Balance/vestibular training;Discharge planning;Functional electrical stimulation;Pain management;Self Care/advanced ADL retraining;Therapeutic Activities;UE/LE Coordination activities;Therapeutic Exercise;Skin care/wound managment;Patient/family education;Functional mobility  training;Disease mangement/prevention;Community reintegration;DME/adaptive equipment instruction;Neuromuscular re-education;Psychosocial support;UE/LE Strength taining/ROM OT Self Feeding Anticipated Outcome(s): Mod I OT Basic Self-Care Anticipated Outcome(s): Supervision-mod I OT Toileting Anticipated Outcome(s): mod I OT Bathroom Transfers Anticipated Outcome(s): Supervision OT Recommendation Recommendations for Other Services: Therapeutic Recreation consult;Neuropsych consult Therapeutic Recreation Interventions: Clinical cytogeneticist;Outing/community reintergration Patient destination: Home Follow Up Recommendations: Home health OT;Outpatient OT Equipment Recommended: To be determined   Skilled Therapeutic Intervention Pt seen for OT eval and ADL bathing/dressing session. Pt in supine upon arrival, denying pain and agreeable to tx session. She completed stand pivot transfers throughout session with mod A overall, requiring increased assist for controlled descent and balance due to trunkal atxia. She bathed seated on BSC in shower for increased sitting support.  She returned to w/c to dress, increased time for problem solving shirt orientation. She required min-mod steadying assist for standing balance with R knee blocked  When completing grooming tasks standing at sink and requiring UE support. During sitting and standing grooming tasks, pt able to use R UE at dominant level independently.  With encouragement, pt willing to stay sitting up in w/c to eat lunch. Education provided regarding benefits of upright sitting to assist with UE control when self-feeding.  Pt set-up with lunch tray and demonstrated ability to open all containers.  Education provided throughout session regarding role of OT, POC, stroke recovery, continuum of care, and d/c planning.    OT Evaluation Precautions/Restrictions  Precautions Precautions: Fall General Chart Reviewed: Yes Pain Pain  Assessment Pain Scale: 0-10 Pain Score: No/denies pain Home Living/Prior Functioning Home Living Family/patient expects to be discharged to:: Private residence Living Arrangements: Spouse/significant other Available Help at  Discharge: Family, Available 24 hours/day Type of Home: Other(Comment)(Independent cottage at nursing home) Home Access: Level entry Home Layout: One level Bathroom Shower/Tub: Holiday representative Accessibility: Yes  Lives With: Spouse IADL History Homemaking Responsibilities: Yes Current License: Yes Mode of Transportation: Car Occupation: Retired Leisure and Hobbies: Very active socially at the assisted living facility. Enjoys card and board games Prior Function Level of Independence: Independent with basic ADLs, Independent with transfers, Independent with homemaking with ambulation, Independent with gait  Able to Take Stairs?: Yes Driving: Yes 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, 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 as well  Leisure: Hobbies-yes (Comment) Comments: Drives, runs errands, active in recreation activities at retirement community Vision Baseline Vision/History: Wears glasses Wears Glasses: At all times Patient Visual Report: No change from baseline Vision Assessment?: No apparent visual deficits Eye Alignment: Within Functional Limits Visual Fields: No apparent deficits Perception  Perception: Within Functional Limits Praxis Praxis: Intact Cognition Overall Cognitive Status: Within Functional Limits for tasks assessed Arousal/Alertness: Awake/alert Orientation Level: Person;Place;Situation Person: Oriented Place: Oriented Situation: Oriented Year: 2019 Month: July Day of Week: Correct Memory: Appears intact Immediate Memory Recall: Sock;Blue;Bed Memory Recall: Sock;Blue;Bed Memory Recall Sock: Without Cue Memory Recall Blue: Without Cue Memory Recall Bed: Without Cue Awareness: Appears intact Problem Solving: Appears  intact Safety/Judgment: Appears intact Sensation Sensation Light Touch: Impaired Detail Light Touch Impaired Details: Impaired RUE;Impaired RLE(Reports tingling in finger tips and R LE) Proprioception: Appears Intact Additional Comments: reports numbness and tingling throughout RLE intermittently Coordination Gross Motor Movements are Fluid and Coordinated: No Fine Motor Movements are Fluid and Coordinated: No Coordination and Movement Description: Intermittent tremors in RUE and BLEs, able to resolve w/ weight bearing 50% of the time Finger Nose Finger Test: Impaired RUE>LUE, slow and undershoots Motor  Motor Motor: Hemiplegia;Motor perseverations Motor - Skilled Clinical Observations: Tremors in BLEs and RUE, R hemi Trunk/Postural Assessment  Cervical Assessment Cervical Assessment: Exceptions to WFL(Forward head) Thoracic Assessment Thoracic Assessment: Within Functional Limits Lumbar Assessment Lumbar Assessment: Exceptions to WFL(Posterior pelvic tilt) Postural Control Postural Control: Deficits on evaluation(Trunkal ataxia affecting sitting and standing balance)  Balance Balance Balance Assessed: Yes Static Sitting Balance Static Sitting - Balance Support: No upper extremity supported;Feet supported Static Sitting - Level of Assistance: 5: Stand by assistance Static Sitting - Comment/# of Minutes: Sitting EOB Dynamic Sitting Balance Dynamic Sitting - Balance Support: No upper extremity supported;Feet supported Dynamic Sitting - Level of Assistance: 5: Stand by assistance;4: Min assist Sitting balance - Comments: Sitting EOB Static Standing Balance Static Standing - Balance Support: During functional activity;Right upper extremity supported Static Standing - Level of Assistance: 4: Min assist;3: Mod assist Static Standing - Comment/# of Minutes: Standing at sink; R knee blocked Dynamic Standing Balance Dynamic Standing - Balance Support: During functional activity;Right  upper extremity supported;Left upper extremity supported Dynamic Standing - Level of Assistance: 3: Mod assist Dynamic Standing - Comments: Standing during functional transfers and ADL tasks Extremity/Trunk Assessment RUE Assessment RUE Assessment: Exceptions to [facility] Active Range of Motion (AROM) Comments: WFL General Strength Comments: 4/5 throughout RUE Body System: Neuro RUE Strength RUE Overall Strength: Within Functional Limits for tasks performed LUE Assessment LUE Assessment: Within Functional Limits General Strength Comments: 4/5 thorughout; demonstrates strength and endurance to use R UE at functional level during self-care tasks independently.   See Function Navigator for Current Functional Status.   Refer to Care Plan for Long Term Goals  Recommendations for other services: Neuropsych and Therapeutic Recreation  Kitchen group, Stress management and Outing/community  reintegration   Discharge Criteria: Patient will be discharged from OT if patient refuses treatment 3 consecutive times without medical reason, if treatment goals not met, if there is a change in medical status, if patient makes no progress towards goals or if patient is discharged from hospital.  The above assessment, treatment plan, treatment alternatives and goals were discussed and mutually agreed upon: by patient  Dijon Kohlman L 04/20/2018, 2:39 PM

## 2018-04-21 ENCOUNTER — Inpatient Hospital Stay (HOSPITAL_COMMUNITY): Payer: Medicare Other | Admitting: Occupational Therapy

## 2018-04-21 ENCOUNTER — Inpatient Hospital Stay (HOSPITAL_COMMUNITY): Payer: Medicare Other | Admitting: Physical Therapy

## 2018-04-21 LAB — HEMOGLOBIN A1C
Hgb A1c MFr Bld: 5 % (ref 4.8–5.6)
Mean Plasma Glucose: 96.8 mg/dL

## 2018-04-21 LAB — GLUCOSE, CAPILLARY: Glucose-Capillary: 116 mg/dL — ABNORMAL HIGH (ref 70–99)

## 2018-04-21 LAB — VITAMIN B12: Vitamin B-12: 276 pg/mL (ref 180–914)

## 2018-04-21 MED ORDER — PRO-STAT SUGAR FREE PO LIQD
30.0000 mL | Freq: Two times a day (BID) | ORAL | Status: DC
  Administered 2018-04-21 – 2018-04-28 (×15): 30 mL via ORAL
  Filled 2018-04-21 (×14): qty 30

## 2018-04-21 MED ORDER — CLONAZEPAM 0.25 MG PO TBDP
0.2500 mg | ORAL_TABLET | Freq: Two times a day (BID) | ORAL | Status: DC
Start: 2018-04-21 — End: 2018-04-28
  Administered 2018-04-21 – 2018-04-28 (×14): 0.25 mg via ORAL
  Filled 2018-04-21 (×16): qty 1

## 2018-04-21 NOTE — Progress Notes (Signed)
Subjective/Complaints: Discussed abnormal movement tremor affecting both UE and LE no prior hx, states she has had severe family stress "caused my stroke" ROS- no CP, SOB, N/V/D Objective: Vital Signs: Blood pressure (!) 145/84, pulse 71, temperature 97.8 F (36.6 C), temperature source Oral, resp. rate 16, height 5' 4"  (1.626 m), weight 73.5 kg (162 lb 0.6 oz), SpO2 97 %. No results found. Results for orders placed or performed during the hospital encounter of 04/19/18 (from the past 72 hour(s))  Glucose, capillary     Status: None   Collection Time: 04/19/18 12:47 PM  Result Value Ref Range   Glucose-Capillary 99 70 - 99 mg/dL  Glucose, capillary     Status: Abnormal   Collection Time: 04/19/18  4:50 PM  Result Value Ref Range   Glucose-Capillary 105 (H) 70 - 99 mg/dL  Glucose, capillary     Status: Abnormal   Collection Time: 04/19/18  9:12 PM  Result Value Ref Range   Glucose-Capillary 114 (H) 70 - 99 mg/dL  CBC WITH DIFFERENTIAL     Status: Abnormal   Collection Time: 04/20/18  5:27 AM  Result Value Ref Range   WBC 4.2 4.0 - 10.5 K/uL   RBC 3.47 (L) 3.87 - 5.11 MIL/uL   Hemoglobin 11.7 (L) 12.0 - 15.0 g/dL   HCT 35.7 (L) 36.0 - 46.0 %   MCV 102.9 (H) 78.0 - 100.0 fL   MCH 33.7 26.0 - 34.0 pg   MCHC 32.8 30.0 - 36.0 g/dL   RDW 13.7 11.5 - 15.5 %   Platelets 110 (L) 150 - 400 K/uL    Comment: SPECIMEN CHECKED FOR CLOTS REPEATED TO VERIFY PLATELET COUNT CONFIRMED BY SMEAR    Neutrophils Relative % 33 %   Neutro Abs 1.4 (L) 1.7 - 7.7 K/uL   Lymphocytes Relative 40 %   Lymphs Abs 1.7 0.7 - 4.0 K/uL   Monocytes Relative 20 %   Monocytes Absolute 0.8 0.1 - 1.0 K/uL   Eosinophils Relative 5 %   Eosinophils Absolute 0.2 0.0 - 0.7 K/uL   Basophils Relative 1 %   Basophils Absolute 0.0 0.0 - 0.1 K/uL   Immature Granulocytes 1 %   Abs Immature Granulocytes 0.0 0.0 - 0.1 K/uL    Comment: Performed at [facility], [address] [place], [place] [address]   Comprehensive metabolic panel     Status: Abnormal   Collection Time: 04/20/18  5:27 AM  Result Value Ref Range   Sodium 143 135 - 145 mmol/L   Potassium 3.5 3.5 - 5.1 mmol/L   Chloride 109 98 - 111 mmol/L    Comment: Please note change in reference range.   CO2 26 22 - 32 mmol/L   Glucose, Bld 116 (H) 70 - 99 mg/dL    Comment: Please note change in reference range.   BUN 5 (L) 8 - 23 mg/dL    Comment: Please note change in reference range.   Creatinine, Ser 0.72 0.44 - 1.00 mg/dL   Calcium 9.3 8.9 - 10.3 mg/dL   Total Protein 5.3 (L) 6.5 - 8.1 g/dL   Albumin 3.3 (L) 3.5 - 5.0 g/dL   AST 30 15 - 41 U/L   ALT 18 0 - 44 U/L    Comment: Please note change in reference range.   Alkaline Phosphatase 32 (L) 38 - 126 U/L   Total Bilirubin 1.1 0.3 - 1.2 mg/dL   GFR calc non Af Amer >60 >60 mL/min   GFR  calc Af Amer >60 >60 mL/min    Comment: (NOTE) The eGFR has been calculated using the CKD EPI equation. This calculation has not been validated in all clinical situations. eGFR's persistently <60 mL/min signify possible Chronic Kidney Disease.    Anion gap 8 5 - 15    Comment: Performed at [facility] [address] [place], [place] [address]  Glucose, capillary     Status: Abnormal   Collection Time: 04/20/18  6:43 AM  Result Value Ref Range   Glucose-Capillary 113 (H) 70 - 99 mg/dL  Vitamin B12     Status: Abnormal   Collection Time: 04/20/18  8:33 AM  Result Value Ref Range   Vitamin B-12 939 (H) 180 - 914 pg/mL    Comment: (NOTE) This assay is not validated for testing neonatal or myeloproliferative syndrome specimens for Vitamin B12 levels. Performed at [facility], [place] [address] [place], [place] [address]   Glucose, capillary     Status: Abnormal   Collection Time: 04/20/18 11:51 AM  Result Value Ref Range   Glucose-Capillary 103 (H) 70 - 99 mg/dL  Glucose, capillary     Status: Abnormal   Collection Time: 04/20/18  5:03 PM  Result Value Ref Range    Glucose-Capillary 106 (H) 70 - 99 mg/dL  Glucose, capillary     Status: Abnormal   Collection Time: 04/20/18 10:13 PM  Result Value Ref Range   Glucose-Capillary 123 (H) 70 - 99 mg/dL     HEENT: normal Cardio: RRR and no murmur Resp: CTA B/L and unlabored GI: reduced BS, NT, ND Extremity:  No Edema Skin:   Other no incisional drainage Neuro: Alert/Oriented, Normal Sensory and Abnormal Motor 5/5 in BUE, 4/5 in BLE Musc/Skel:  Other no pain with UE or LE ROM GEN NAD   Assessment/Plan: 1. Functional deficits secondary to RIght frontal infarct which require 3+ hours per day of interdisciplinary therapy in a comprehensive inpatient rehab setting. Physiatrist is providing close team supervision and 24 hour management of active medical problems listed below. Physiatrist and rehab team continue to assess barriers to discharge/monitor patient progress toward functional and medical goals. FIM: Function - Bathing Position: Shower Body parts bathed by patient: Right arm, Right lower leg, Left arm, Left lower leg, Chest, Abdomen, Front perineal area, Buttocks, Right upper leg, Left upper leg Body parts bathed by helper: Back Assist Level: Touching or steadying assistance(Pt > 75%)  Function- Upper Body Dressing/Undressing What is the patient wearing?: Pull over shirt/dress Pull over shirt/dress - Perfomed by patient: Thread/unthread right sleeve, Thread/unthread left sleeve, Put head through opening, Pull shirt over trunk Assist Level: Supervision or verbal cues, Set up Set up : To obtain clothing/put away Function - Lower Body Dressing/Undressing What is the patient wearing?: Pants, Shoes Position: Wheelchair/chair at sink Pants- Performed by patient: Pull pants up/down Pants- Performed by helper: Thread/unthread right pants leg, Thread/unthread left pants leg Shoes - Performed by patient: Don/doff right shoe, Don/doff left shoe, Fasten left Shoes - Performed by helper: Fasten  right Assist for footwear: Supervision/touching assist Assist for lower body dressing: Touching or steadying assistance (Pt > 75%)  Function - Toileting Toileting steps completed by patient: Performs perineal hygiene, Adjust clothing after toileting Toileting steps completed by helper: Adjust clothing prior to toileting Toileting Assistive Devices: Grab bar or rail Assist level: Touching or steadying assistance (Pt.75%)  Function - Toilet Transfers Toilet transfer assistive device: Grab bar, Bedside commode Assist level to bedside commode (at  bedside): Moderate assist (Pt 50 - 74%/lift or lower) Assist level from bedside commode (at bedside): Moderate assist (Pt 50 - 74%/lift or lower)  Function - Chair/bed transfer Chair/bed transfer method: Stand pivot Chair/bed transfer assist level: Moderate assist (Pt 50 - 74%/lift or lower) Chair/bed transfer assistive device: Armrests, Walker Chair/bed transfer details: Verbal cues for safe use of DME/AE, Manual facilitation for placement, Manual facilitation for weight shifting, Manual facilitation for weight bearing, Verbal cues for precautions/safety, Verbal cues for sequencing  Function - Locomotion: Wheelchair Will patient use wheelchair at discharge?: (TBD) Type: Manual Max wheelchair distance: 150' Assist Level: Supervision or verbal cues Assist Level: Supervision or verbal cues Assist Level: Supervision or verbal cues Turns around,maneuvers to table,bed, and toilet,negotiates 3% grade,maneuvers on rugs and over doorsills: No Function - Locomotion: Ambulation Assistive device: Walker-rolling Max distance: 43' Assist level: Touching or steadying assistance (Pt > 75%) Assist level: Touching or steadying assistance (Pt > 75%) Walk 50 feet with 2 turns activity did not occur: Safety/medical concerns Walk 150 feet activity did not occur: Safety/medical concerns Walk 10 feet on uneven surfaces activity did not occur: Safety/medical  concerns  Function - Comprehension Comprehension: Auditory Comprehension assist level: Understands complex 90% of the time/cues 10% of the time  Function - Expression Expression: Verbal Expression assist level: Expresses complex 90% of the time/cues < 10% of the time  Function - Social Interaction Social Interaction assist level: Interacts appropriately with others with medication or extra time (anti-anxiety, antidepressant).  Function - Problem Solving Problem solving assist level: Solves complex 90% of the time/cues < 10% of the time  Function - Memory Memory assist level: Recognizes or recalls 75 - 89% of the time/requires cueing 10 - 24% of the time Patient normally able to recall (first 3 days only): Current season, That he or she is in a hospital, Location of own room, Staff names and faces  Medical Problem List and Plan: 1. 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 DVT Prophylaxis/Anticoagulation: Pharmaceutical:Other (comment)--Eliquis 3. Pain Management:tylenol prn. 4. Mood:LCSW to follow for evaluation and support. 5. Neuropsych: This patient RIght frontal infarct capable of making decisions on her own behalf.complains of severe family related stressor 6. Skin/Wound Care:Routine pressure relief measures. 7. Fluids/Electrolytes/Nutrition:Monitor I/O. Check lytes in am.  8. CAF: Monitor HR bid. Controlled off medications. On Eliquis Vitals:   04/20/18 1958 04/21/18 0332  BP: 133/90 (!) 145/84  Pulse: 76 71  Resp: 17 16  Temp: 98.4 F (36.9 C) 97.8 F (36.6 C)  SpO2: 93% 97%  was on Lopressor PTA, may resume low dose, this may help tremor 9. Spinal stenosis with radiculopathy RLE: Tylenol prn  10. HTN: Monitor BP bid--allow for adequate perfusion. Off metoprolol, amlodipine and losartan at this time.  11. Seizure prophylaxis: On Keppra bid. No documented seizures   13.GERD/H/o  dysphagia/Schatzik's ring s/p dilatation/esophageal spasms:Resume home Zantac. 12. ?Spasticity:no physical sign will d/c baclofen. Tremor may be due to anxiety Schedule klonopin 13 Hypoalb add prostat LOS (Days) 2 A FACE TO FACE EVALUATION WAS PERFORMED  Charlett Blake 04/21/2018, 8:38 AM

## 2018-04-21 NOTE — Progress Notes (Signed)
Physical Therapy Session Note  Patient Details  Name: Isabel Hernandez MRN: [id number] Date of Birth: January 03, 1941  Today's Date: 04/21/2018 PT Individual Time: 1000-1055 PT Individual Time Calculation (min): 55 min   Short Term Goals: Week 1:  PT Short Term Goal 1 (Week 1): Pt will ambulate 25' w/ LRAD w/ min assist PT Short Term Goal 2 (Week 1): Pt will participate in 30 min of OOB activity w/o increase in fatigue PT Short Term Goal 3 (Week 1): Pt will transfer bed<>chair w/ min assist PT Short Term Goal 4 (Week 1): Pt will maintain dynamic standing balance during functional tasks w/ min assist  Skilled Therapeutic Interventions/Progress Updates:   Pt in w/c and agreeable to therapy, denies pain. Pt self-propelled w/c to therapy gym for endurance training. Performed car transfer w/ min assist, manual assist needed to lift RLE into car. Worked on RLE NMR, pregait, and gait tasks the remainder of session. Performed lateral weight shifting, L step forward and backward, marching in place, and R step up to 2" step to work on R quad and hip flexor activation. Min tactile and manual cues for muscle activation and min guard for balance. Ambulated 66' and 60' w/ RW and min guard. Frequent verbal and visual cues for gait pattern, decreased R foot clearance today but improved R quad control in single leg stance. Returned to room and ended session in w/c, call bell within reach and all needs met. Mild BLE tremors noted today during some seated rest breaks, resolved w/ WB. LE tremors do no appear to be related to triggering of clonus.   Therapy Documentation Precautions:  Precautions Precautions: Fall Restrictions Weight Bearing Restrictions: No  See Function Navigator for Current Functional Status.   Therapy/Group: Individual Therapy  Tishara Pizano K Arnette 04/21/2018, 10:57 AM

## 2018-04-21 NOTE — Progress Notes (Signed)
Occupational Therapy Session Note  Patient Details  Name: Arnecia Ector MRN: [id number] Date of Birth: 1940-12-30  Today's Date: 04/21/2018 OT Individual Time: 1300-1400 and [id number] OT Individual Time Calculation (min): 60 min and 72 min Short Term Goals: Week 1:  OT Short Term Goal 1 (Week 1): Pt Will complete 2 grooming tasks standing at sink with CGA to increase functional standing balance/endurance OT Short Term Goal 2 (Week 1): Pt will complete functional transfers with steadying assist using LRAD OT Short Term Goal 3 (Week 1): Pt will complete 3/3 toileting tasks with CGA  Skilled Therapeutic Interventions/Progress Updates:    Pt greeted supine in bed with no c/o pain. Unable to sleep last night due to hospital commotion. Discussed using channel 37 (be[place] tonight to mask these noises and help improve sleep quality. Pt donned clothes EOB to work on trunk control. She required steady assist for dynamic balance. Afterwards pt transferred to w/c via stand pivot with RW and Mod A. Mod A stand pivot<toilet with use of grab bars and pt able to complete 3/3 components of toileting. Handwashing/oral care/grooming tasks completed while standing at sink with Rt knee supported after. Continued working on Rt knee control while standing to assist with bedmaking tasks. She did well with cues to keep her "knee strong" when it started buckling. Pt stood without UE support while adjusting sheets/comforters, and donning pillowcases. Min vcs for correct orientation of linen. Afterwards, we cleaned her room and sanitized ADL items per pt preference. Pt left in w/c with all needs within reach and chair alarm set.   2nd Session 1:1 tx (60 min) Pt greeted in w/c with no c/o pain. Requesting to use restroom. She ambulated with RW and steady assist to bathroom. Able to complete 3/3 components of toileting herself. After ambulating back to room, washing hands, and brushing teeth (in standing), she returned to w/c.  Pt requiring cues for safe hand placement/DME mgt during transferring. Afterwards pt was escorted via w/c to gift shop. Worked on dynamic balance and functional ambulation in community setting. Pt ambulated with RW and steady assist around shop, removing unilateral and bilateral UE support from walker to look at clothing or jewelry. She required cues for increasing stride length and forward gaze when walking. Buckling tendencies significantly improved from AM session. Pt also reported that she often ate out at restaurants with spouse. Worked on Museum/gallery exhibitions officer transfers in food court. Pt required steady assist and cues for technique with RW. At end of tx pt was returned to room via w/c. Pt left with all needs within reach, sanitizing hands with her personal wipes.   Therapy Documentation Precautions:  Precautions Precautions: Fall Restrictions Weight Bearing Restrictions: No Pain: No c/o pain during session    ADL:   See Function Navigator for Current Functional Status.   Therapy/Group: Individual Therapy  Salif Tay A Myya Meenach 04/21/2018, 3:57 PM

## 2018-04-22 ENCOUNTER — Inpatient Hospital Stay (HOSPITAL_COMMUNITY): Payer: Medicare Other | Admitting: Physical Therapy

## 2018-04-22 ENCOUNTER — Inpatient Hospital Stay (HOSPITAL_COMMUNITY): Payer: Medicare Other

## 2018-04-22 NOTE — IPOC Note (Signed)
Overall Plan of Care [facility]) Patient Details Name: Isabel Hernandez MRN: [id number] DOB: [birth year]  Admitting Diagnosis: <principal problem not specified>  Hospital Problems: Active Problems:   Ischemic stroke of frontal lobe (HCC)   Right hemiparesis (HCC)   Chronic atrial fibrillation (HCC)   Gait disturbance, post-stroke     Functional Problem List: Nursing Endurance, Safety  PT Balance, Endurance, Motor, Pain, Safety, Sensory  OT Balance, Pain, Safety, Sensory, Endurance, Motor  SLP    TR         Basic ADL's: OT Eating, Grooming, Dressing, Toileting, Bathing     Advanced  ADL's: OT Simple Meal Preparation     Transfers: PT Bed Mobility, Bed to Chair, Car, Furniture, Futures trader, Metallurgist: PT Ambulation, Emergency planning/management officer, Stairs     Additional Impairments: OT Fuctional Use of Upper Extremity  SLP        TR      Anticipated Outcomes Item Anticipated Outcome  Self Feeding Mod I  Swallowing      Basic self-care  Supervision-mod I  Toileting  mod I   Bathroom Transfers Supervision  Bowel/Bladder  will maintain a normal bowel/bladder evacuation schedule while in IP Rehab  Transfers  Mod I   Locomotion  Supervision household gait  Communication     Cognition     Pain  Will remain at a tolerable pain level while in IP Rehab  Safety/Judgment  will remain free from falls and/or injuries while inpt rehab   Therapy Plan: PT Intensity: Minimum of 1-2 x/day ,45 to 90 minutes PT Frequency: 5 out of 7 days PT Duration Estimated Length of Stay: 10-14 days OT Intensity: Minimum of 1-2 x/day, 45 to 90 minutes OT Frequency: 5 out of 7 days OT Duration/Estimated Length of Stay: 10-14 days      Team Interventions: Nursing Interventions Patient/Family Education, Pain Management, Bladder Management, Medication Management, Bowel Management, Psychosocial Support  PT interventions Ambulation/gait training, Stair training, Pain management,  Disease management/prevention, Visual/perceptual remediation/compensation, Wheelchair propulsion/positioning, Therapeutic Activities, Patient/family education, DME/adaptive equipment instruction, Training and development officer, Psychosocial support, Cognitive remediation/compensation, Functional electrical stimulation, Therapeutic Exercise, UE/LE Strength taining/ROM, Skin care/wound management, Functional mobility training, Community reintegration, Discharge planning, Neuromuscular re-education, Splinting/orthotics, UE/LE Coordination activities  OT Interventions Training and development officer, Discharge planning, Functional electrical stimulation, Pain management, Self Care/advanced ADL retraining, Therapeutic Activities, UE/LE Coordination activities, Therapeutic Exercise, Skin care/wound managment, Patient/family education, Functional mobility training, Disease mangement/prevention, Community reintegration, Engineer, drilling, Neuromuscular re-education, Psychosocial support, UE/LE Strength taining/ROM  SLP Interventions    TR Interventions    SW/CM Interventions Discharge Planning, Psychosocial Support, Patient/Family Education   Barriers to Discharge MD  Medical stability  Nursing Inaccessible home environment, Home environment access/layout    PT      OT      SLP      SW       Team Discharge Planning: Destination: PT-Home(Independent part of retirement community) ,OT- Home , SLP-  Projected Follow-up: PT-Home health PT, Outpatient PT(OPPT of husband is able to safely drive her), OT-  Home health OT, Outpatient OT, SLP-  Projected Equipment Needs: PT-To be determined, OT- To be determined, SLP-  Equipment Details: PT- , OT-  Patient/family involved in discharge planning: PT- Patient,  OT-Patient, SLP-   MD ELOS: 7-10d Medical Rehab Prognosis:  Good Assessment:  77 year old female with history of HTN, CAS, A fib, Lumbar stenosis with radiculopathy who was admitted to [facility]  on 04/17/18 with numbness and jerking  movements of RUE and RLE. MRI/MRA brain done revealing left posteromedial frontal lobe infarct including precentral gyrus and increased diffusion left posterior frontal white matter probably due to subacute infarct or question acute inflammatory or demyelinating lesion---4-6 week follow up recommended to assess change. No carotid or vertebral stenosis. 2D echo showed EF 65-70% with calcified mitral valve and mild LVH. Carotid dopplers showed moderate bilateral atherosclerotic plaque R >L without significant stenosis. She was placed on Eliquis for embolic stroke and started on Keppra to help manage spasms/jerks.   Now requiring 24/7 Rehab RN,MD, as well as CIR level PT, OT and SLP.  Treatment team will focus on ADLs and mobility with goals set at Mod I  See Team Conference Notes for weekly updates to the plan of care

## 2018-04-22 NOTE — Progress Notes (Signed)
Physical Therapy Session Note  Patient Details  Name: Isabel Hernandez MRN: [id number] Date of Birth: Mar 24, 1941  Today's Date: 04/22/2018 PT Individual Time: 1601-0932 PT Individual Time Calculation (min): 72 min   Short Term Goals: Week 1:  PT Short Term Goal 1 (Week 1): Pt will ambulate 25' w/ LRAD w/ min assist PT Short Term Goal 2 (Week 1): Pt will participate in 30 min of OOB activity w/o increase in fatigue PT Short Term Goal 3 (Week 1): Pt will transfer bed<>chair w/ min assist PT Short Term Goal 4 (Week 1): Pt will maintain dynamic standing balance during functional tasks w/ min assist  Skilled Therapeutic Interventions/Progress Updates: Pt presented in bathroom agreeable to therapy. Performed toilet transfer with supervision and able to perform clothing management with supervision. Pt ambulated to sink to perform hand hygiene in standing with RW min guard. Pt performed ambulatory transfer to w/c with posterior lean requiring minA from PTA for recovery. Pt transported to rehab gym for energy conservation and performed stand pivot to mat. Participated in obstacle course weaving through cones requiring minA for RW management and mod verbal cues for increasing step length. Participated in standing balance playing horseshoes on level tile and min to mod challenges of reaching for object. Then performed with single LE on 4in step. Pt noted to have heavy reliance RW during second round of activity. Pt returned to w/c and transported to stairs. Pt participated in ascending/descending 3in steps x 8, and 6in steps x 4 with step to pattern requiring CGA for both sets. Participated in gait training 1f x 2 with emphasis on increasing step length and BOS. Pt able to perform with min/mod verbal cues for correction however able to demonstrate fair carryover during second bout. Pt transported to day room and performed NuStep L1 x 8 min for endurance and cardiovascular activity. Pt performed ambulatory transfer to  w/c from NuStep with CGA and demonstrating improved step length with activity. Pt transported back to room and remained in w/c at end of session with call bell within reach and needs met.      Therapy Documentation Precautions:  Precautions Precautions: Fall Restrictions Weight Bearing Restrictions: No   See Function Navigator for Current Functional Status.   Therapy/Group: Individual Therapy  Lorrene Graef  Tywon Niday, PTA  04/22/2018, 3:52 PM

## 2018-04-22 NOTE — Progress Notes (Signed)
Occupational Therapy Session Note  Patient Details  Name: Isabel Hernandez MRN: [id number] Date of Birth: 14-Dec-1940  Today's Date: 04/22/2018 OT Individual Time: 0900-1000 OT Individual Time Calculation (min): 60 min    Short Term Goals: Week 1:  OT Short Term Goal 1 (Week 1): Pt Will complete 2 grooming tasks standing at sink with CGA to increase functional standing balance/endurance OT Short Term Goal 2 (Week 1): Pt will complete functional transfers with steadying assist using LRAD OT Short Term Goal 3 (Week 1): Pt will complete 3/3 toileting tasks with CGA  Skilled Therapeutic Interventions/Progress Updates:    Pt resting in w/c upon arrival.  Pt already bathed and dressed prior to therapy.  OT intervention with focus on functional amb with RW, functional transfers (toilet and walk-in shower), sit<>stand, standing balance, discharge planning, activity tolerance, and safety awareness to increase independence with BADLs.  Pt amb approx 75' with RW (steady A) and min verbal cues to avoid walking into wall on her L.  Left drift noted with ambulation along with small stride pattern.  No R knee buckling noted this morning.  Pt practiced walk-in shower transfers (min A) and bed transfers with min A to lift RLE onto bed.  Pt issued a walker bag and walker safety education initiated.  Pt also engaged in [place] activities to increase RUE dexterity.  Pt returned to room and remained in w/c with all needs within reach.   Therapy Documentation Precautions:  Precautions Precautions: Fall Restrictions Weight Bearing Restrictions: No   Pain: Pain Assessment Pain Scale: 0-10 Pain Score: 0-No pain  See Function Navigator for Current Functional Status.   Therapy/Group: Individual Therapy  Leroy Libman 04/22/2018, 12:26 PM

## 2018-04-22 NOTE — Plan of Care (Signed)
  Problem: Consults Goal: RH STROKE PATIENT EDUCATION Description See Patient Education module for education specifics  Outcome: Progressing   Problem: RH BOWEL ELIMINATION Goal: RH STG MANAGE BOWEL WITH ASSISTANCE Description STG Manage Bowel with Assistance. Outcome: Progressing Goal: RH STG MANAGE BOWEL W/MEDICATION W/ASSISTANCE Description STG Manage Bowel with Medication with Assistance. Outcome: Progressing

## 2018-04-22 NOTE — Progress Notes (Signed)
Subjective/Complaints: No issues overnite except neighbor was on call bell a lot.  Tremors improving, no excess sedation ROS- no CP, SOB, N/V/D Objective: Vital Signs: Blood pressure 131/68, pulse 64, temperature 98 F (36.7 C), temperature source Oral, resp. rate 18, height 5' 4"  (1.626 m), weight 73.5 kg (162 lb 0.6 oz), SpO2 97 %. No results found. Results for orders placed or performed during the hospital encounter of 04/19/18 (from the past 72 hour(s))  Glucose, capillary     Status: None   Collection Time: 04/19/18 12:47 PM  Result Value Ref Range   Glucose-Capillary 99 70 - 99 mg/dL  Glucose, capillary     Status: Abnormal   Collection Time: 04/19/18  4:50 PM  Result Value Ref Range   Glucose-Capillary 105 (H) 70 - 99 mg/dL  Glucose, capillary     Status: Abnormal   Collection Time: 04/19/18  9:12 PM  Result Value Ref Range   Glucose-Capillary 114 (H) 70 - 99 mg/dL  CBC WITH DIFFERENTIAL     Status: Abnormal   Collection Time: 04/20/18  5:27 AM  Result Value Ref Range   WBC 4.2 4.0 - 10.5 K/uL   RBC 3.47 (L) 3.87 - 5.11 MIL/uL   Hemoglobin 11.7 (L) 12.0 - 15.0 g/dL   HCT 35.7 (L) 36.0 - 46.0 %   MCV 102.9 (H) 78.0 - 100.0 fL   MCH 33.7 26.0 - 34.0 pg   MCHC 32.8 30.0 - 36.0 g/dL   RDW 13.7 11.5 - 15.5 %   Platelets 110 (L) 150 - 400 K/uL    Comment: SPECIMEN CHECKED FOR CLOTS REPEATED TO VERIFY PLATELET COUNT CONFIRMED BY SMEAR    Neutrophils Relative % 33 %   Neutro Abs 1.4 (L) 1.7 - 7.7 K/uL   Lymphocytes Relative 40 %   Lymphs Abs 1.7 0.7 - 4.0 K/uL   Monocytes Relative 20 %   Monocytes Absolute 0.8 0.1 - 1.0 K/uL   Eosinophils Relative 5 %   Eosinophils Absolute 0.2 0.0 - 0.7 K/uL   Basophils Relative 1 %   Basophils Absolute 0.0 0.0 - 0.1 K/uL   Immature Granulocytes 1 %   Abs Immature Granulocytes 0.0 0.0 - 0.1 K/uL    Comment: Performed at [facility], [address], [place], [place] [address]  Comprehensive metabolic panel     Status:  Abnormal   Collection Time: 04/20/18  5:27 AM  Result Value Ref Range   Sodium 143 135 - 145 mmol/L   Potassium 3.5 3.5 - 5.1 mmol/L   Chloride 109 98 - 111 mmol/L    Comment: Please note change in reference range.   CO2 26 22 - 32 mmol/L   Glucose, Bld 116 (H) 70 - 99 mg/dL    Comment: Please note change in reference range.   BUN 5 (L) 8 - 23 mg/dL    Comment: Please note change in reference range.   Creatinine, Ser 0.72 0.44 - 1.00 mg/dL   Calcium 9.3 8.9 - 10.3 mg/dL   Total Protein 5.3 (L) 6.5 - 8.1 g/dL   Albumin 3.3 (L) 3.5 - 5.0 g/dL   AST 30 15 - 41 U/L   ALT 18 0 - 44 U/L    Comment: Please note change in reference range.   Alkaline Phosphatase 32 (L) 38 - 126 U/L   Total Bilirubin 1.1 0.3 - 1.2 mg/dL   GFR calc non Af Amer >60 >60 mL/min   GFR calc Af Amer >60 >60 mL/min  Comment: (NOTE) The eGFR has been calculated using the CKD EPI equation. This calculation has not been validated in all clinical situations. eGFR's persistently <60 mL/min signify possible Chronic Kidney Disease.    Anion gap 8 5 - 15    Comment: Performed at [facility] [address] [place], [place] [address]  Glucose, capillary     Status: Abnormal   Collection Time: 04/20/18  6:43 AM  Result Value Ref Range   Glucose-Capillary 113 (H) 70 - 99 mg/dL  Vitamin B12     Status: Abnormal   Collection Time: 04/20/18  8:33 AM  Result Value Ref Range   Vitamin B-12 939 (H) 180 - 914 pg/mL    Comment: (NOTE) This assay is not validated for testing neonatal or myeloproliferative syndrome specimens for Vitamin B12 levels. Performed at [facility], [place] [address] [place], [place] [address]   Glucose, capillary     Status: Abnormal   Collection Time: 04/20/18 11:51 AM  Result Value Ref Range   Glucose-Capillary 103 (H) 70 - 99 mg/dL  Glucose, capillary     Status: Abnormal   Collection Time: 04/20/18  5:03 PM  Result Value Ref Range   Glucose-Capillary 106 (H) 70 - 99 mg/dL   Glucose, capillary     Status: Abnormal   Collection Time: 04/20/18 10:13 PM  Result Value Ref Range   Glucose-Capillary 123 (H) 70 - 99 mg/dL  Glucose, capillary     Status: Abnormal   Collection Time: 04/21/18  6:26 AM  Result Value Ref Range   Glucose-Capillary 116 (H) 70 - 99 mg/dL  Vitamin B12     Status: None   Collection Time: 04/21/18  7:01 AM  Result Value Ref Range   Vitamin B-12 276 180 - 914 pg/mL    Comment: (NOTE) This assay is not validated for testing neonatal or myeloproliferative syndrome specimens for Vitamin B12 levels. Performed at [facility], [place] [address] [place], [place] [address]   Hemoglobin A1c     Status: None   Collection Time: 04/21/18  9:01 AM  Result Value Ref Range   Hgb A1c MFr Bld 5.0 4.8 - 5.6 %    Comment: (NOTE) Pre diabetes:          5.7%-6.4% Diabetes:              >6.4% Glycemic control for   <7.0% adults with diabetes    Mean Plasma Glucose 96.8 mg/dL    Comment: Performed at [facility] [address], [place], [place] [address]     HEENT: normal Cardio: RRR and no murmur Resp: CTA B/L and unlabored GI: reduced BS, NT, ND Extremity:  No Edema Skin:   Other no incisional drainage Neuro: Alert/Oriented, Normal Sensory and Abnormal Motor 5/5 in BUE, 4/5 in BLE Musc/Skel:  Other no pain with UE or LE ROM GEN NAD   Assessment/Plan: 1. Functional deficits secondary to RIght frontal infarct which require 3+ hours per day of interdisciplinary therapy in a comprehensive inpatient rehab setting. Physiatrist is providing close team supervision and 24 hour management of active medical problems listed below. Physiatrist and rehab team continue to assess barriers to discharge/monitor patient progress toward functional and medical goals. FIM: Function - Bathing Bathing activity did not occur: Refused Position: Shower Body parts bathed by patient: Right arm, Right lower leg, Left arm, Left lower leg, Chest, Abdomen,  Front perineal area, Buttocks, Right upper leg, Left upper leg Body  parts bathed by helper: Back Assist Level: Touching or steadying assistance(Pt > 75%)  Function- Upper Body Dressing/Undressing What is the patient wearing?: Pull over shirt/dress Pull over shirt/dress - Perfomed by patient: Thread/unthread right sleeve, Thread/unthread left sleeve, Put head through opening, Pull shirt over trunk Assist Level: Supervision or verbal cues, Set up Set up : To obtain clothing/put away Function - Lower Body Dressing/Undressing What is the patient wearing?: Pants, Non-skid slipper socks Position: Sitting EOB Pants- Performed by patient: Thread/unthread right pants leg, Thread/unthread left pants leg, Pull pants up/down Pants- Performed by helper: Thread/unthread right pants leg, Thread/unthread left pants leg Non-skid slipper socks- Performed by patient: Don/doff right sock, Don/doff left sock Shoes - Performed by patient: Don/doff right shoe, Don/doff left shoe, Fasten left Shoes - Performed by helper: Fasten right Assist for footwear: Supervision/touching assist Assist for lower body dressing: Touching or steadying assistance (Pt > 75%)  Function - Toileting Toileting steps completed by patient: Performs perineal hygiene, Adjust clothing after toileting, Adjust clothing prior to toileting Toileting steps completed by helper: Adjust clothing prior to toileting Toileting Assistive Devices: Grab bar or rail Assist level: Touching or steadying assistance (Pt.75%)  Function - Toilet Transfers Toilet transfer assistive device: Grab bar, Elevated toilet seat/BSC over toilet, Walker Assist level to bedside commode (at bedside): Touching or steadying assistance (Pt > 75%) Assist level from bedside commode (at bedside): Touching or steadying assistance (Pt > 75%)  Function - Chair/bed transfer Chair/bed transfer method: Stand pivot Chair/bed transfer assist level: Touching or steadying assistance  (Pt > 75%) Chair/bed transfer assistive device: Armrests, Walker Chair/bed transfer details: Verbal cues for safe use of DME/AE, Manual facilitation for placement, Manual facilitation for weight shifting, Manual facilitation for weight bearing, Verbal cues for precautions/safety, Verbal cues for sequencing  Function - Locomotion: Wheelchair Will patient use wheelchair at discharge?: (TBD) Type: Manual Max wheelchair distance: 150' Assist Level: Supervision or verbal cues Assist Level: Supervision or verbal cues Assist Level: Supervision or verbal cues Turns around,maneuvers to table,bed, and toilet,negotiates 3% grade,maneuvers on rugs and over doorsills: No Function - Locomotion: Ambulation Assistive device: Walker-rolling Max distance: 12' Assist level: Touching or steadying assistance (Pt > 75%) Assist level: Touching or steadying assistance (Pt > 75%) Walk 50 feet with 2 turns activity did not occur: Safety/medical concerns Assist level: Touching or steadying assistance (Pt > 75%) Walk 150 feet activity did not occur: Safety/medical concerns Walk 10 feet on uneven surfaces activity did not occur: Safety/medical concerns  Function - Comprehension Comprehension: Auditory Comprehension assist level: Understands complex 90% of the time/cues 10% of the time  Function - Expression Expression: Verbal Expression assist level: Expresses complex 90% of the time/cues < 10% of the time  Function - Social Interaction Social Interaction assist level: Interacts appropriately with others with medication or extra time (anti-anxiety, antidepressant).  Function - Problem Solving Problem solving assist level: Solves complex 90% of the time/cues < 10% of the time  Function - Memory Memory assist level: Recognizes or recalls 75 - 89% of the time/requires cueing 10 - 24% of the time Patient normally able to recall (first 3 days only): Current season, That he or she is in a hospital, Location of  own room, Staff names and faces  Medical Problem List and Plan: 1. 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 DVT Prophylaxis/Anticoagulation: Pharmaceutical:Other (comment)--Eliquis 3. Pain Management:tylenol prn. 4. Mood:LCSW to follow for evaluation and support. 5. Neuropsych: This patient RIght  frontal infarct capable of making decisions on her own behalf.complains of severe family related stressor 6. Skin/Wound Care:Routine pressure relief measures. 7. Fluids/Electrolytes/Nutrition:Monitor I/O. Check lytes in am.  8. CAF: Monitor HR bid. Controlled off medications. On Eliquis Vitals:   04/21/18 1923 04/22/18 0523  BP: (!) 144/65 131/68  Pulse: 81 64  Resp: 18 18  Temp: 99.3 F (37.4 C) 98 F (36.7 C)  SpO2: 94% 97%  was on Lopressor PTA, may resume low dose, this may help tremor 9. Spinal stenosis with radiculopathy RLE: Tylenol prn  10. HTN: Monitor BP bid--allow for adequate perfusion. Off metoprolol, amlodipine and losartan at this time.  11. Seizure prophylaxis: On Keppra bid. No documented seizures   13.GERD/H/o dysphagia/Schatzik's ring s/p dilatation/esophageal spasms:Resume home Zantac. 12. ?Spasticity:no physical sign will d/c baclofen. Tremor may be due to anxiety Schedule klonopin 13 Hypoalb add prostat LOS (Days) 3 A FACE TO FACE EVALUATION WAS PERFORMED  Charlett Blake 04/22/2018, 9:01 AM

## 2018-04-23 ENCOUNTER — Inpatient Hospital Stay (HOSPITAL_COMMUNITY): Payer: Medicare Other

## 2018-04-23 ENCOUNTER — Inpatient Hospital Stay (HOSPITAL_COMMUNITY): Payer: Medicare Other | Admitting: Physical Therapy

## 2018-04-23 NOTE — Progress Notes (Signed)
Physical Therapy Session Note  Patient Details  Name: Isabel Hernandez MRN: [id number] Date of Birth: Mar 24, 1941  Today's Date: 04/23/2018 PT Individual Time: 0830-0900 PT Individual Time Calculation (min): 30 min   Short Term Goals: Week 1:  PT Short Term Goal 1 (Week 1): Pt will ambulate 25' w/ LRAD w/ min assist PT Short Term Goal 2 (Week 1): Pt will participate in 30 min of OOB activity w/o increase in fatigue PT Short Term Goal 3 (Week 1): Pt will transfer bed<>chair w/ min assist PT Short Term Goal 4 (Week 1): Pt will maintain dynamic standing balance during functional tasks w/ min assist  Skilled Therapeutic Interventions/Progress Updates:    Pt performed dressing EOB to prepare for out of room with set up assist and supervision for standing balance while pulling up pants. Functional gait training with RW to and from therapy gym x 150' with close supervision and verbal cues for increased step length on R and upright posture. Trial without AD for NMR with min assist and verbal cues still for increased step length and upright posture- decreased weightbearing and stance time on RLE noted. NMR for balance re-training using Biodex for limits of stability activity and maze control program both on non compliant and compliant surface (level 10) several trials. Pt noted to rely on UE's and cues provided for increased pelvic and trunk mobility vs. Bending through LE's for weightshift.   Therapy Documentation Precautions:  Precautions Precautions: Fall Restrictions Weight Bearing Restrictions: No Pain:  Denies pain.   See Function Navigator for Current Functional Status.   Therapy/Group: Individual Therapy  Canary Brim Ivory Broad, PT, DPT  04/23/2018, 9:42 AM

## 2018-04-23 NOTE — Progress Notes (Signed)
Occupational Therapy Session Note  Patient Details  Name: Isabel Hernandez MRN: [id number] Date of Birth: 03-28-1941  Today's Date: 04/23/2018 OT Individual Time: 1300-1343 OT Individual Time Calculation (min): 43 min    Short Term Goals: Week 1:  OT Short Term Goal 1 (Week 1): Pt Will complete 2 grooming tasks standing at sink with CGA to increase functional standing balance/endurance OT Short Term Goal 2 (Week 1): Pt will complete functional transfers with steadying assist using LRAD OT Short Term Goal 3 (Week 1): Pt will complete 3/3 toileting tasks with CGA  Skilled Therapeutic Interventions/Progress Updates:    OT intervention with focus on functional amb with RW, dynamic standing, safety awareness, home safety, discharge planning, and activity tolerance to increase independence with BADLs and IADLs. Discussed management of home chores and kitchen activities at home.  Pt stated that her husband assisted with household chores and cooking as needed prior to this admission. Pt amb with RW from room to ADL apartment at supervision level and completed simple kitchen tasks without RW and using counter for balance. Discussed RW and kitchen safety. Discussed laundry tasks and pt state her husband would carry laundry to laundry room. Pt amb to gym and engaged in game of Arthur while standing. Pt noted with much improved used of BUE during tasks with no tremors noted.  Pt retruned to room and remained seated in w/c with all needs within reach.   Therapy Documentation Precautions:  Precautions Precautions: Fall Restrictions Weight Bearing Restrictions: No  Pain:  Pt denies pain  See Function Navigator for Current Functional Status.   Therapy/Group: Individual Therapy  Leroy Libman 04/23/2018, 1:44 PM

## 2018-04-23 NOTE — Progress Notes (Signed)
Occupational Therapy Session Note  Patient Details  Name: Isabel Hernandez MRN: [id number] Date of Birth: 15-Aug-1941  Today's Date: 04/23/2018 OT Individual Time: 1100-1200 OT Individual Time Calculation (min): 60 min    Short Term Goals: Week 1:  OT Short Term Goal 1 (Week 1): Pt Will complete 2 grooming tasks standing at sink with CGA to increase functional standing balance/endurance OT Short Term Goal 2 (Week 1): Pt will complete functional transfers with steadying assist using LRAD OT Short Term Goal 3 (Week 1): Pt will complete 3/3 toileting tasks with CGA  Skilled Therapeutic Interventions/Progress Updates:    OT intervention with initial focus on BADL retraining including bathing at shower level and dressing with sit<>stand from chair.  Pt amb in room with RW to gather clothing prior to walking into bathroom for shower.  Pt completed bathing and dressing at supervision level. Pt completed grooming tasks standing at sink. Focus transitioned to standing balance activities at [employer] on compliant and noncompliant surfaces and Biodex at supervision level.  Pt amb with RW to gym and engaged in functional amb tasks gathering objects randomly placed in gym.  Pt returned to room and remained in w/c with belt alarm on and all needs within reach.   Therapy Documentation Precautions:  Precautions Precautions: Fall Restrictions Weight Bearing Restrictions: No Pain:  Pt denies pain  See Function Navigator for Current Functional Status.   Therapy/Group: Individual Therapy  Leroy Libman 04/23/2018, 12:02 PM

## 2018-04-23 NOTE — Progress Notes (Signed)
Physical Therapy Session Note  Patient Details  Name: Isabel Hernandez MRN: [id number] Date of Birth: Feb 21, 1941  Today's Date: 04/23/2018 PT Individual Time: 0927-1024 PT Individual Time Calculation (min): 57 min   Short Term Goals: Week 1:  PT Short Term Goal 1 (Week 1): Pt will ambulate 25' w/ LRAD w/ min assist PT Short Term Goal 2 (Week 1): Pt will participate in 30 min of OOB activity w/o increase in fatigue PT Short Term Goal 3 (Week 1): Pt will transfer bed<>chair w/ min assist PT Short Term Goal 4 (Week 1): Pt will maintain dynamic standing balance during functional tasks w/ min assist  Skilled Therapeutic Interventions/Progress Updates:    Session today initiated with pt seated up in w/c in room.  Pt denies pain.  Vitals pre: 129/66;  HR: 75 bpm.  Session focused on improving automaticity with mobility.  Pt ambulated today both with and without assistive device with cues to increase step length.  Pt ambulated without assistive device with min A to CGA with manual cues for b/l arm swing and also with dual task manual of carrying cup.  Pt performed step up using R LE to increase R LE closed chain power and sit to stand working on automaticity without UE use.  Pt also performed floor to mat transfer to decrease fear of falling.  Pt required increased time and mod assist for floor transfer due to decreased problem solving and decreased UE/LE strength.  While descending to floor, pt reports R shoulder pain, however, following transfer, pt was able to complete remainder of session with normal functional AROM in B UE.  Pt denied R shoulder pain multiple times during remainder and following session.  Pt left up in chair following session with needs met and call bell in reach, chair alarm on.  Therapy Documentation Precautions:  Precautions Precautions: Fall Restrictions Weight Bearing Restrictions: No   See Function Navigator for Current Functional Status.   Therapy/Group: Individual  Therapy  Kevaughn Ewing Hilario Quarry 04/23/2018, 12:33 PM

## 2018-04-23 NOTE — Progress Notes (Signed)
Subjective/Complaints: C/o cough.  Not meal related, no SOB, non productive,      ROS- no CP, SOB, N/V/D Objective: Vital Signs: Blood pressure 126/78, pulse 64, temperature (!) 97.5 F (36.4 C), temperature source Oral, resp. rate 15, height 5\' 4"  (1.626 m), weight 73.5 kg (162 lb 0.6 oz), SpO2 97 %. No results found. Results for orders placed or performed during the hospital encounter of 04/19/18 (from the past 72 hour(s))  Glucose, capillary     Status: Abnormal   Collection Time: 04/20/18 11:51 AM  Result Value Ref Range   Glucose-Capillary 103 (H) 70 - 99 mg/dL  Glucose, capillary     Status: Abnormal   Collection Time: 04/20/18  5:03 PM  Result Value Ref Range   Glucose-Capillary 106 (H) 70 - 99 mg/dL  Glucose, capillary     Status: Abnormal   Collection Time: 04/20/18 10:13 PM  Result Value Ref Range   Glucose-Capillary 123 (H) 70 - 99 mg/dL  Glucose, capillary     Status: Abnormal   Collection Time: 04/21/18  6:26 AM  Result Value Ref Range   Glucose-Capillary 116 (H) 70 - 99 mg/dL  Vitamin B12     Status: None   Collection Time: 04/21/18  7:01 AM  Result Value Ref Range   Vitamin B-12 276 180 - 914 pg/mL    Comment: (NOTE) This assay is not validated for testing neonatal or myeloproliferative syndrome specimens for Vitamin B12 levels. Performed at [facility], [place] [address] [place], [place] [address]   Hemoglobin A1c     Status: None   Collection Time: 04/21/18  9:01 AM  Result Value Ref Range   Hgb A1c MFr Bld 5.0 4.8 - 5.6 %    Comment: (NOTE) Pre diabetes:          5.7%-6.4% Diabetes:              >6.4% Glycemic control for   <7.0% adults with diabetes    Mean Plasma Glucose 96.8 mg/dL    Comment: Performed at [facility] [address] [place], [place] [address]     HEENT: normal Cardio: RRR and no murmur Resp: CTA B/L and unlabored GI: reduced BS, NT, ND Extremity:  No Edema Skin:   Other no incisional drainage Neuro:  Alert/Oriented, Normal Sensory and Abnormal Motor 5/5 in BUE, 4/5 in BLE Musc/Skel:  Other no pain with UE or LE ROM GEN NAD   Assessment/Plan: 1. Functional deficits secondary to RIght frontal infarct which require 3+ hours per day of interdisciplinary therapy in a comprehensive inpatient rehab setting. Physiatrist is providing close team supervision and 24 hour management of active medical problems listed below. Physiatrist and rehab team continue to assess barriers to discharge/monitor patient progress toward functional and medical goals. FIM: Function - Bathing Bathing activity did not occur: Refused Position: Shower Body parts bathed by patient: Right arm, Right lower leg, Left arm, Left lower leg, Chest, Abdomen, Front perineal area, Buttocks, Right upper leg, Left upper leg Body parts bathed by helper: Back Assist Level: Touching or steadying assistance(Pt > 75%)  Function- Upper Body Dressing/Undressing What is the patient wearing?: Pull over shirt/dress Pull over shirt/dress - Perfomed by patient: Thread/unthread right sleeve, Thread/unthread left sleeve, Put head through opening, Pull shirt over trunk Assist Level: Supervision or verbal cues, Set up Set up : To obtain clothing/put away Function - Lower Body Dressing/Undressing What is the patient wearing?: Pants, Non-skid slipper socks Position: Sitting EOB Pants-  Performed by patient: Thread/unthread right pants leg, Thread/unthread left pants leg, Pull pants up/down Pants- Performed by helper: Thread/unthread right pants leg, Thread/unthread left pants leg Non-skid slipper socks- Performed by patient: Don/doff right sock, Don/doff left sock Shoes - Performed by patient: Don/doff right shoe, Don/doff left shoe, Fasten left Shoes - Performed by helper: Fasten right Assist for footwear: Supervision/touching assist Assist for lower body dressing: Touching or steadying assistance (Pt > 75%)  Function - Toileting Toileting  steps completed by patient: Adjust clothing prior to toileting, Performs perineal hygiene, Adjust clothing after toileting Toileting steps completed by helper: Adjust clothing prior to toileting Toileting Assistive Devices: Grab bar or rail Assist level: Touching or steadying assistance (Pt.75%)  Function - Toilet Transfers Toilet transfer assistive device: Grab bar, Elevated toilet seat/BSC over toilet, Walker Assist level to toilet: Touching or steadying assistance (Pt > 75%) Assist level to bedside commode (at bedside): Touching or steadying assistance (Pt > 75%) Assist level from bedside commode (at bedside): Touching or steadying assistance (Pt > 75%)  Function - Chair/bed transfer Chair/bed transfer method: Ambulatory Chair/bed transfer assist level: Touching or steadying assistance (Pt > 75%) Chair/bed transfer assistive device: Armrests, Walker Chair/bed transfer details: Verbal cues for safe use of DME/AE, Manual facilitation for placement, Manual facilitation for weight shifting, Manual facilitation for weight bearing, Verbal cues for precautions/safety, Verbal cues for sequencing  Function - Locomotion: Wheelchair Will patient use wheelchair at discharge?: (TBD) Type: Manual Max wheelchair distance: 150' Assist Level: Supervision or verbal cues Assist Level: Supervision or verbal cues Assist Level: Supervision or verbal cues Turns around,maneuvers to table,bed, and toilet,negotiates 3% grade,maneuvers on rugs and over doorsills: No Function - Locomotion: Ambulation Assistive device: Walker-rolling Max distance: 45ft Assist level: Touching or steadying assistance (Pt > 75%) Assist level: Touching or steadying assistance (Pt > 75%) Walk 50 feet with 2 turns activity did not occur: Safety/medical concerns Assist level: Touching or steadying assistance (Pt > 75%) Walk 150 feet activity did not occur: Safety/medical concerns Walk 10 feet on uneven surfaces activity did not  occur: Safety/medical concerns  Function - Comprehension Comprehension: Auditory Comprehension assist level: Understands complex 90% of the time/cues 10% of the time  Function - Expression Expression: Verbal Expression assist level: Expresses complex 90% of the time/cues < 10% of the time  Function - Social Interaction Social Interaction assist level: Interacts appropriately with others with medication or extra time (anti-anxiety, antidepressant).  Function - Problem Solving Problem solving assist level: Solves complex 90% of the time/cues < 10% of the time  Function - Memory Memory assist level: Recognizes or recalls 75 - 89% of the time/requires cueing 10 - 24% of the time Patient normally able to recall (first 3 days only): Current season, That he or she is in a hospital, Location of own room, Staff names and faces  Medical Problem List and Plan: 1. 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 DVT Prophylaxis/Anticoagulation: Pharmaceutical:Other (comment)--Eliquis 3. Pain Management:tylenol prn. 4. Mood:LCSW to follow for evaluation and support. 5. Neuropsych: This patient RIght frontal infarct capable of making decisions on her own behalf.complains of severe family related stressor  6. Skin/Wound Care:Routine pressure relief measures. 7. Fluids/Electrolytes/Nutrition:Monitor I/O. Check lytes in am.  8. CAF: Monitor HR bid. Controlled off medications. On Eliquis Vitals:   04/23/18 0542 04/23/18 0808  BP: 123/77 126/78  Pulse: 68 64  Resp: 15   Temp: (!) 97.5 F (36.4 C)   SpO2: 97%  was on Lopressor PTA, may resume low dose, this may help tremor 9. Spinal stenosis with radiculopathy RLE: Tylenol prn  10. HTN: Monitor BP bid--allow for adequate perfusion. Off metoprolol, amlodipine and losartan at this time.  11. Seizure prophylaxis: On Keppra bid. No documented seizures   13.GERD/H/o  dysphagia/Schatzik's ring s/p dilatation/esophageal spasms:Resume home Zantac. 12. ?Spasticity:no physical sign will d/c baclofen. Tremor may be due to anxiety Schedule klonopin 13 Hypoalb add prostat 14.  Cough- non productive with out fever, normal lung exam, hx of environmental allergies, pt will have husband bring in her OTC flonase LOS (Days) 4 A FACE TO FACE EVALUATION WAS PERFORMED  Charlett Blake 04/23/2018, 9:00 AM

## 2018-04-24 ENCOUNTER — Inpatient Hospital Stay (HOSPITAL_COMMUNITY): Payer: Medicare Other | Admitting: Occupational Therapy

## 2018-04-24 ENCOUNTER — Inpatient Hospital Stay (HOSPITAL_COMMUNITY): Payer: Medicare Other | Admitting: Physical Therapy

## 2018-04-24 ENCOUNTER — Inpatient Hospital Stay (HOSPITAL_COMMUNITY): Payer: Medicare Other

## 2018-04-24 NOTE — Evaluation (Signed)
Recreational Therapy Assessment and Plan  Patient Details  Name: Isabel Hernandez MRN: [id number] Date of Birth: 10/14/1940 Today's Date: 04/24/2018  Rehab Potential: Good ELOS: 10 days  Assessment  Problem List:      Patient Active Problem List   Diagnosis Date Noted  . Ischemic stroke of frontal lobe ([place]) 04/19/2018  . Right hemiparesis ([place])   . Chronic atrial fibrillation ([place])   . Gait disturbance, post-stroke   . UTI (urinary tract infection) 04/17/2018  . Acute CVA (cerebrovascular accident) ([place]) 04/17/2018  . Hip flexor tendinitis 09/08/2017  . 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 Hand or foot spasms 05/09/2017  . Tremor 05/09/2017  . Sinusitis 04/28/2017  . Lumbar spinal stenosis 03/01/2017  . Lumbar radiculopathy, right 01/30/2017  . Right hip pain 08/19/2016  . Pneumonitis 05/19/2016  . GERD (gastroesophageal reflux disease) 05/19/2016  . NSAID long-term use 03/22/2016  . Primary osteoarthritis of left knee 03/22/2016  . Primary osteoarthritis of right hand 03/22/2016  . Rheumatoid factor positive 03/22/2016  . Essential hypertension 02/17/2016  . Carotid stenosis 02/17/2016  . Abnormal chest x-ray 02/17/2016  . Hyperlipidemia 02/17/2016  . Allergic rhinitis 02/09/2016    Past Medical History:      Past Medical History:  Diagnosis Date  . A-fib ([place])   . Arthritis   . Breast cancer ([place]) 2002   RT LUMPECTOMY  . Bronchitis   . Carotid artery narrowings   . 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 Carotid artery occlusion    RIGHT 50% BLOCKAGE  . Colon polyps   . Complication of anesthesia    shaking/ FOLLOWING LOCAL AT DENTIST , drop O2 sats TO 50% DURING COLONOSCOPY  . Coronary artery disease   . Diverticulosis   . Dysrhythmia   . Environmental allergies   . GERD (gastroesophageal reflux disease)   . History of hiatal hernia   . Hyperlipidemia   . Hypertension   . IBS (irritable bowel syndrome)   .  Mitral valve disorder   . Shortness of breath dyspnea   . Syncope and collapse   . UTI (lower urinary tract infection)    Past Surgical History:       Past Surgical History:  Procedure Laterality Date  . APPENDECTOMY    . BREAST BIOPSY Right 2002   POS  . BREAST CYST ASPIRATION Right   . BREAST EXCISIONAL BIOPSY Left 1997   NEG  . BREAST LUMPECTOMY    . CATARACT EXTRACTION W/PHACO Right 06/07/2016   Procedure: CATARACT EXTRACTION PHACO AND INTRAOCULAR LENS PLACEMENT (IOC);  Surgeon: Birder Robson, MD;  Location: [facility] ORS;  Service: Ophthalmology;  Laterality: Right;  [place] 00:51 AP% 19.6 CDE 10.09 Fluid pack lot # [id number]  . CATARACT EXTRACTION W/PHACO Left 07/05/2016   Procedure: CATARACT EXTRACTION PHACO AND INTRAOCULAR LENS PLACEMENT (IOC);  Surgeon: Birder Robson, MD;  Location: [facility] ORS;  Service: Ophthalmology;  Laterality: Left;  [place] 00:38 AP% 24.9 CDE 9.60 Fluid Pack lot # [id number]  . CESAREAN SECTION    . TONSILLECTOMY    . TOTAL KNEE ARTHROPLASTY Left     Assessment & Plan Clinical Impression: Isabel Hernandez is a 77 year old female with history of HTN, CAS, A fib, Lumbar stenosis with radiculopathy who was admitted to [facility] on 04/17/18 with numbness and jerking movements of RUE and RLE. MRI/MRA brain done revealing left posteromedial frontal lobe infarct including precentral gyrus and increased diffusion left posterior frontal white matter probably due to subacute  infarct or question acute inflammatory or demyelinating lesion---4-6 week follow up recommended to assess change. No carotid or vertebral stenosis. 2D echo showed EF 65-70% with calcified mitral valve and mild LVH. Carotid dopplers showed moderate bilateral atherosclerotic plaque R >L without significant stenosis. She was placed on Eliquis for embolic stroke and started on Keppra to help manage spasms/jerks. Baclofen added with improvement in spasms but she continues to have significant  impairments in mobility as well as ability to carry out ADLs CIR recommended due to functional deficits. Patient transferred to CIR on 04/19/2018 .    Pt presents with decreased activity tolerance, decreased functional mobility, decreased balance, decreased coordination, feelings of anxiety and stress Limiting pt's independence with leisure/community pursuits.  Plan Min 1 TR session >20 minutes during LOS  Recommendations for other services: None   Discharge Criteria: Patient will be discharged from TR if patient refuses treatment 3 consecutive times without medical reason.  If treatment goals not met, if there is a change in medical status, if patient makes no progress towards goals or if patient is discharged from hospital.  The above assessment, treatment plan, treatment alternatives and goals were discussed and mutually agreed upon: by patient  [place] 04/24/2018, 3:00 PM

## 2018-04-24 NOTE — Progress Notes (Signed)
Social Work Patient ID: Isabel Hernandez, female   DOB: 08/19/1941, 77 y.o.   MRN: [id number]  Husband to come in tomorrow for education and see pt in therapies. Pt is doing well and making good progress in her therapies. Team conference tomorrow will work on discharge needs.

## 2018-04-24 NOTE — Progress Notes (Signed)
Physical Therapy Session Note  Patient Details  Name: Isabel Hernandez MRN: [id number] Date of Birth: 1941-04-24  Today's Date: 04/24/2018 PT Individual Time: 0900-0955 AND 1415-1515 PT Individual Time Calculation (min): 55 min AND 60 min  Short Term Goals: Week 1:  PT Short Term Goal 1 (Week 1): Pt will ambulate 25' w/ LRAD w/ min assist PT Short Term Goal 2 (Week 1): Pt will participate in 30 min of OOB activity w/o increase in fatigue PT Short Term Goal 3 (Week 1): Pt will transfer bed<>chair w/ min assist PT Short Term Goal 4 (Week 1): Pt will maintain dynamic standing balance during functional tasks w/ min assist  Skilled Therapeutic Interventions/Progress Updates:   Session 1:  Pt toileting and agreeable to therapy, denies pain and reports tremors are much improved today. Finished toileting tasks and washing hands w/ supervision. Ambulated to therapy gym w/ supervision using RW, increased gait speed this session compared to previous sessions. Worked on standing balance this session while standing to perform UE task w/o UE support on both firm and compliant surfaces, close supervision. Performed Berg Balance Scale as detailed below and explained significance of results to pt including increased fall risk. Practiced ambulating w/o AD, 100' x2, and 150'. Min guard for safety and pt comfort, no overt LOB. Decreased gait speed and increased stiffness when ambulating w/o AD, will continue to work towards gait w/ LRAD. Returned to room and ended session in w/c, call bell within reach and all needs met.   Session 2: Pt in w/c and agreeable to therapy, denies pain. Ambulated around unit w/ supervision w/o AD and verbal cues for safety, gait pattern, and for increased trunk rotation. Pt has a very stiff gait w/o AD. Performed NuStep 10 min @ level 3 for LE strengthening and endurance. Discussed d/c plan while on NuStep including outpatient referral for balance and endurance as well as husband coming in  tomorrow for family education. Pt in agreement and wishes to have RW for community level gait upon d/c until her endurance and balance improve. Discussed stroke support group and lifestyle changes associated w/ decreased risk of stroke. Pt appreciative of education. Worked on standing tolerance and functional balance while making cookies in ADL kitchen. Min set-up assist overall for time management and energy conservation, otherwise pt able to safely stay in standing for 10-15 min at a time before needing seated rest break. Returned to room and ended session in supine, call bell within reach and all needs met.   Therapy Documentation Precautions:  Precautions Precautions: Fall Restrictions Weight Bearing Restrictions: No Balance: Standardized Balance Assessment Standardized Balance Assessment: Berg Balance Test Berg Balance Test Sit to Stand: Able to stand without using hands and stabilize independently Standing Unsupported: Able to stand safely 2 minutes Sitting with Back Unsupported but Feet Supported on Floor or Stool: Able to sit safely and securely 2 minutes Stand to Sit: Sits safely with minimal use of hands Transfers: Able to transfer safely, definite need of hands Standing Unsupported with Eyes Closed: Able to stand 10 seconds with supervision Standing Ubsupported with Feet Together: Able to place feet together independently and stand for 1 minute with supervision From Standing, Reach Forward with Outstretched Arm: Can reach confidently >25 cm (10") From Standing Position, Pick up Object from Floor: Able to pick up shoe, needs supervision From Standing Position, Turn to Look Behind Over each Shoulder: Looks behind one side only/other side shows less weight shift Turn 360 Degrees: Able to turn 360 degrees safely but  slowly Standing Unsupported, Alternately Place Feet on Step/Stool: Able to complete 4 steps without aid or supervision Standing Unsupported, One Foot in Front: Able to take  small step independently and hold 30 seconds Standing on One Leg: Tries to lift leg/unable to hold 3 seconds but remains standing independently Total Score: 42  See Function Navigator for Current Functional Status.   Therapy/Group: Individual Therapy  Adalind Weitz K Arnette 04/24/2018, 9:56 AM

## 2018-04-24 NOTE — Progress Notes (Signed)
Subjective/Complaints: No pain issues , breathing ok    ROS- no CP, SOB, N/V/D Objective: Vital Signs: Blood pressure 139/73, pulse 69, temperature 97.9 F (36.6 C), temperature source Oral, resp. rate 18, height 5\' 4"  (1.626 m), weight 73.5 kg (162 lb 0.6 oz), SpO2 97 %. No results found. Results for orders placed or performed during the hospital encounter of 04/19/18 (from the past 72 hour(s))  Hemoglobin A1c     Status: None   Collection Time: 04/21/18  9:01 AM  Result Value Ref Range   Hgb A1c MFr Bld 5.0 4.8 - 5.6 %    Comment: (NOTE) Pre diabetes:          5.7%-6.4% Diabetes:              >6.4% Glycemic control for   <7.0% adults with diabetes    Mean Plasma Glucose 96.8 mg/dL    Comment: Performed at [facility], [place] [address], [place], [place] [address]     HEENT: normal Cardio: RRR and no murmur Resp: CTA B/L and unlabored GI: reduced BS, NT, ND Extremity:  No Edema Skin:   Other no incisional drainage Neuro: Alert/Oriented, Normal Sensory and Abnormal Motor 5/5 in BUE, 4/5 in BLE Musc/Skel:  Other no pain with UE or LE ROM GEN NAD   Assessment/Plan: 1. Functional deficits secondary to RIght frontal infarct which require 3+ hours per day of interdisciplinary therapy in a comprehensive inpatient rehab setting. Physiatrist is providing close team supervision and 24 hour management of active medical problems listed below. Physiatrist and rehab team continue to assess barriers to discharge/monitor patient progress toward functional and medical goals. FIM: Function - Bathing Bathing activity did not occur: Refused Position: Shower Body parts bathed by patient: Right arm, Right lower leg, Left arm, Left lower leg, Chest, Abdomen, Front perineal area, Buttocks, Right upper leg, Left upper leg Body parts bathed by helper: Back Assist Level: Supervision or verbal cues  Function- Upper Body Dressing/Undressing What is the patient wearing?: Pull over  shirt/dress Pull over shirt/dress - Perfomed by patient: Thread/unthread right sleeve, Thread/unthread left sleeve, Put head through opening, Pull shirt over trunk Assist Level: Supervision or verbal cues, Set up Set up : To obtain clothing/put away Function - Lower Body Dressing/Undressing What is the patient wearing?: Underwear, Pants, Shoes Position: Wheelchair/chair at sink Underwear - Performed by patient: Thread/unthread right underwear leg, Thread/unthread left underwear leg, Pull underwear up/down Pants- Performed by patient: Thread/unthread right pants leg, Thread/unthread left pants leg, Pull pants up/down Pants- Performed by helper: Thread/unthread right pants leg, Thread/unthread left pants leg Non-skid slipper socks- Performed by patient: Don/doff right sock, Don/doff left sock Shoes - Performed by patient: Don/doff right shoe, Don/doff left shoe, Fasten right, Fasten left Shoes - Performed by helper: Fasten right Assist for footwear: Supervision/touching assist Assist for lower body dressing: Supervision or verbal cues  Function - Toileting Toileting steps completed by patient: Adjust clothing prior to toileting, Performs perineal hygiene, Adjust clothing after toileting Toileting steps completed by helper: Adjust clothing prior to toileting Toileting Assistive Devices: Grab bar or rail Assist level: Touching or steadying assistance (Pt.75%)  Function - Toilet Transfers Toilet transfer assistive device: Grab bar, Elevated toilet seat/BSC over toilet, Walker Assist level to toilet: Touching or steadying assistance (Pt > 75%) Assist level from toilet: Touching or steadying assistance (Pt > 75%) Assist level to bedside commode (at bedside): Touching or steadying assistance (Pt > 75%) Assist level from bedside commode (at bedside): Touching or  steadying assistance (Pt > 75%)  Function - Chair/bed transfer Chair/bed transfer method: Ambulatory Chair/bed transfer assist level:  Touching or steadying assistance (Pt > 75%) Chair/bed transfer assistive device: Armrests Chair/bed transfer details: Verbal cues for technique, Verbal cues for precautions/safety, Manual facilitation for weight shifting  Function - Locomotion: Wheelchair Will patient use wheelchair at discharge?: No Type: Manual Max wheelchair distance: 150' Assist Level: Supervision or verbal cues Assist Level: Supervision or verbal cues Assist Level: Supervision or verbal cues Turns around,maneuvers to table,bed, and toilet,negotiates 3% grade,maneuvers on rugs and over doorsills: No Function - Locomotion: Ambulation Assistive device: No device Max distance: (150 ft) Assist level: Touching or steadying assistance (Pt > 75%) Assist level: Touching or steadying assistance (Pt > 75%) Walk 50 feet with 2 turns activity did not occur: Safety/medical concerns Assist level: Touching or steadying assistance (Pt > 75%) Walk 150 feet activity did not occur: Safety/medical concerns Assist level: Touching or steadying assistance (Pt > 75%) Walk 10 feet on uneven surfaces activity did not occur: Safety/medical concerns  Function - Comprehension Comprehension: Auditory Comprehension assist level: Understands complex 90% of the time/cues 10% of the time  Function - Expression Expression: Verbal Expression assist level: Expresses complex 90% of the time/cues < 10% of the time  Function - Social Interaction Social Interaction assist level: Interacts appropriately with others with medication or extra time (anti-anxiety, antidepressant).  Function - Problem Solving Problem solving assist level: Solves complex 90% of the time/cues < 10% of the time  Function - Memory Memory assist level: Recognizes or recalls 75 - 89% of the time/requires cueing 10 - 24% of the time Patient normally able to recall (first 3 days only): Current season, That he or she is in a hospital, Location of own room, Staff names and  faces  Medical Problem List and Plan: 1. 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 DVT Prophylaxis/Anticoagulation: Pharmaceutical:Other (comment)--Eliquis 3. Pain Management:tylenol prn. 4. Mood:LCSW to follow for evaluation and support. 5. Neuropsych: This patient RIght frontal infarct capable of making decisions on her own behalf.complains of severe family related stressor  6. Skin/Wound Care:Routine pressure relief measures. 7. Fluids/Electrolytes/Nutrition:Monitor I/O. I 665ml yesterday 8. CAF: Monitor HR bid. Controlled off medications. On Eliquis Vitals:   04/23/18 1927 04/24/18 0507  BP: (!) 145/80 139/73  Pulse: 79 69  Resp: 18 18  Temp: 98.4 F (36.9 C) 97.9 F (36.6 C)  SpO2: 98% 97%  controlled HR on Lopressor 7/23 9. Spinal stenosis with radiculopathy RLE: Tylenol prn  10. HTN: Monitor BP bid--allow for adequate perfusion. Off metoprolol, amlodipine and losartan at this time.  11. Seizure prophylaxis: On Keppra bid. No documented seizures   13.GERD/H/o dysphagia/Schatzik's ring s/p dilatation/esophageal spasms:Resume home Zantac. 12. ?Spasticity:no physical sign will d/c baclofen. Tremor may be due to anxiety Schedule klonopin .25mg  BID 13 Hypoalb add prostat 14.  Cough- non productive with out fever, normal lung exam, hx of environmental allergies, pt will have husband bring in her OTC flonase LOS (Days) 5 A FACE TO FACE EVALUATION WAS PERFORMED  Charlett Blake 04/24/2018, 8:49 AM

## 2018-04-24 NOTE — Progress Notes (Signed)
Physical Therapy Session Note  Patient Details  Name: Isabel Hernandez MRN: [id number] Date of Birth: 07-27-1941  Today's Date: 04/24/2018 PT Individual Time: 3810-1751 PT Individual Time Calculation (min): 30 min   Short Term Goals: Week 1:  PT Short Term Goal 1 (Week 1): Pt will ambulate 25' w/ LRAD w/ min assist PT Short Term Goal 2 (Week 1): Pt will participate in 30 min of OOB activity w/o increase in fatigue PT Short Term Goal 3 (Week 1): Pt will transfer bed<>chair w/ min assist PT Short Term Goal 4 (Week 1): Pt will maintain dynamic standing balance during functional tasks w/ min assist  Skilled Therapeutic Interventions/Progress Updates:    Session focused on functional gait training without AD to/from therapy with overall steadying assist and verbal and tactile cues for more upright posture and verbal cues for increased step length with RLE (improved from trial yesterday with this PT). Focused on NMR for balance re-training in standing through various activities including kicking ball with alternating LE's, tossing and catching ball with BUE's and then alternating between the 2 activities with overall min assist for balance, cone taps with increasingly difficult patterns to increase single limb stance time with no UE support with min to mod assist to prevent LOB. Compliant surface re-training with intermittent UE support on foam with eyes open, eyes closed, tandem stance while performing functional reaching task (min to mod assist), and heel raises. Retro gait training with focus on increasing step length x 10' x 3 trials total.   Therapy Documentation Precautions:  Precautions Precautions: Fall Restrictions Weight Bearing Restrictions: No    Pain: Pain Assessment Pain Score: 0-No pain  See Function Navigator for Current Functional Status.   Therapy/Group: Individual Therapy  Canary Brim Ivory Broad, PT, DPT  04/24/2018, 2:42 PM

## 2018-04-24 NOTE — Progress Notes (Signed)
Occupational Therapy Session Note  Patient Details  Name: Isabel Hernandez MRN: [id number] Date of Birth: 04/08/1941  Today's Date: 04/24/2018 OT Individual Time: 7517-0017 OT Individual Time Calculation (min): 60 min    Short Term Goals: Week 1:  OT Short Term Goal 1 (Week 1): Pt Will complete 2 grooming tasks standing at sink with CGA to increase functional standing balance/endurance OT Short Term Goal 2 (Week 1): Pt will complete functional transfers with steadying assist using LRAD OT Short Term Goal 3 (Week 1): Pt will complete 3/3 toileting tasks with CGA  Skilled Therapeutic Interventions/Progress Updates:    Pt seen this session with a focus on balance skills.  Pt ambulated without AD to shower/ toilet with CGA.  Once in shower, pt sat on tub bench and bathed with S. She then dressed with set up/S sitting on elevated toilet.  Pt completed grooming tasks and then ambulated to day room with CGA.  Pt cued for upright posture as she tends to walk with slightly flexed posture which limits her ability to fully advance feet. In day room, pt practiced standing against wall for upright posture, wall squats and standing arm wall slides for R shoulder strength/ROM.  Pt tolerated exercises well with no discomfort. Pt rested briefly and then ambulated back to room. Pt set up with lunch tray.    Therapy Documentation Precautions:  Precautions Precautions: Fall Restrictions Weight Bearing Restrictions: No   Pain: Pain Assessment Pain Score: 0-No pain ADL:    See Function Navigator for Current Functional Status.   Therapy/Group: Individual Therapy  Penelopi Mikrut 04/24/2018, 12:25 PM

## 2018-04-25 ENCOUNTER — Inpatient Hospital Stay (HOSPITAL_COMMUNITY): Payer: Medicare Other

## 2018-04-25 ENCOUNTER — Inpatient Hospital Stay (HOSPITAL_COMMUNITY): Payer: Medicare Other | Admitting: *Deleted

## 2018-04-25 ENCOUNTER — Ambulatory Visit (HOSPITAL_COMMUNITY): Payer: Medicare Other | Admitting: Physical Therapy

## 2018-04-25 NOTE — Progress Notes (Signed)
Social Work Patient ID: Isabel Hernandez, female   DOB: October 22, 1940, 77 y.o.   MRN: [id number]  Met with pt and husband who was here to participate in therapies with pt. She is doing well and team feels by Sat 7/27 she will be ready for discharge. Her goals are supervision level. Both feel she will be ready to go home on Sat and husband can provide supervision level. Wants to go to OP at [facility] will make referral.

## 2018-04-25 NOTE — Progress Notes (Signed)
Occupational Therapy Session Note  Patient Details  Name: Isabel Hernandez MRN: [id number] Date of Birth: [birth year]  Today's Date: 04/25/2018 OT Individual Time: 1300-1330 OT Individual Time Calculation (min): 30 min    Short Term Goals: Week 1:  OT Short Term Goal 1 (Week 1): Pt Will complete 2 grooming tasks standing at sink with CGA to increase functional standing balance/endurance OT Short Term Goal 2 (Week 1): Pt will complete functional transfers with steadying assist using LRAD OT Short Term Goal 3 (Week 1): Pt will complete 3/3 toileting tasks with CGA  Skilled Therapeutic Interventions/Progress Updates:    OT intervention with focus on improved handwriting skills and RUE/hand strengthening with theraputty.  Pt practiced signing name with improvement compared to Monday sample. Pt issued lined paper to practice making circles within lines to improve fluidity of UE/hand movement.  Pt also issued theraputty and handout with activities/tasks to improve strength.  Pt currently using putty for general grasp strengthening.  Pt remained seated at table in Day Room for TR session.   Therapy Documentation Precautions:  Precautions Precautions: Fall Restrictions Weight Bearing Restrictions: No   Pain:  Pt denies pain  See Function Navigator for Current Functional Status.   Therapy/Group: Individual Therapy  Leroy Libman 04/25/2018, 2:54 PM

## 2018-04-25 NOTE — Progress Notes (Signed)
Physical Therapy Session Note  Patient Details  Name: Isabel Hernandez MRN: [id number] Date of Birth: 1940-10-20  Today's Date: 04/25/2018 PT Individual Time: 1115-1200 PT Individual Time Calculation (min): 45 min   Short Term Goals: Week 1:  PT Short Term Goal 1 (Week 1): Pt will ambulate 25' w/ LRAD w/ min assist PT Short Term Goal 2 (Week 1): Pt will participate in 30 min of OOB activity w/o increase in fatigue PT Short Term Goal 3 (Week 1): Pt will transfer bed<>chair w/ min assist PT Short Term Goal 4 (Week 1): Pt will maintain dynamic standing balance during functional tasks w/ min assist  Skilled Therapeutic Interventions/Progress Updates:    Pt received seated in w/c in room, agreeable to PT. No complaints of pain. Husband present during therapy session for family training. Pt performs all transfers throughout therapy session with Supervision. Ambulation x 100 ft with no AD and Supervision to CGA, pt requests use of RW for longer distance gait x 150 ft (+), Supervision with use of AD. Car transfer with Supervision with use of RW simulating van height. Floor transfer with min to mod A for trunk control, v/c for safe transfer technique and sequencing. Pt's husband demos good safety and understanding of providing Supervision level assist for patient with transfers and gait. Standing balance activities: side-steps with BUE support, Biodex limits of stability lvl 2 with no UE support and CGA with v/c for weight shift with hips, Biodex catch game lvl 1 with no UE support and CGA for multidirectional weight shift. Pt left seated in w/c in room with needs in reach at end of session, husband present.  Therapy Documentation Precautions:  Precautions Precautions: Fall Restrictions Weight Bearing Restrictions: No  See Function Navigator for Current Functional Status.   Therapy/Group: Individual Therapy  Excell Seltzer, PT, DPT  04/25/2018, 2:45 PM

## 2018-04-25 NOTE — Progress Notes (Signed)
Subjective/Complaints: Asking to meet with dietician Also c/o occ word finding deficits    ROS- no CP, SOB, N/V/D Objective: Vital Signs: Blood pressure 137/66, pulse 71, temperature 98 F (36.7 C), temperature source Oral, resp. rate 18, height '5\' 4"'$  (1.626 m), weight 74 kg (163 lb 2.3 oz), SpO2 96 %. No results found. No results found for this or any previous visit (from the past 72 hour(s)).   HEENT: normal Cardio: RRR and no murmur Resp: CTA B/L and unlabored GI: reduced BS, NT, ND Extremity:  No Edema Skin:   Other no incisional drainage Neuro: Alert/Oriented, Normal Sensory and Abnormal Motor 5/5 in BUE, 4/5 in BLE Musc/Skel:  Other no pain with UE or LE ROM GEN NAD   Assessment/Plan: 1. Functional deficits secondary to RIght frontal infarct which require 3+ hours per day of interdisciplinary therapy in a comprehensive inpatient rehab setting. Physiatrist is providing close team supervision and 24 hour management of active medical problems listed below. Physiatrist and rehab team continue to assess barriers to discharge/monitor patient progress toward functional and medical goals. FIM: Function - Bathing Bathing activity did not occur: Refused Position: Shower Body parts bathed by patient: Right arm, Right lower leg, Left arm, Left lower leg, Chest, Abdomen, Front perineal area, Buttocks, Right upper leg, Left upper leg Body parts bathed by helper: Back Assist Level: Supervision or verbal cues  Function- Upper Body Dressing/Undressing What is the patient wearing?: Pull over shirt/dress Pull over shirt/dress - Perfomed by patient: Thread/unthread right sleeve, Thread/unthread left sleeve, Put head through opening, Pull shirt over trunk Assist Level: Supervision or verbal cues, Set up Set up : To obtain clothing/put away Function - Lower Body Dressing/Undressing What is the patient wearing?: Underwear, Pants, Shoes Position: Other (comment)(sitting on toilet) Underwear  - Performed by patient: Thread/unthread right underwear leg, Thread/unthread left underwear leg, Pull underwear up/down Pants- Performed by patient: Thread/unthread right pants leg, Thread/unthread left pants leg, Pull pants up/down Pants- Performed by helper: Thread/unthread right pants leg, Thread/unthread left pants leg Non-skid slipper socks- Performed by patient: Don/doff right sock, Don/doff left sock Shoes - Performed by patient: Don/doff right shoe, Don/doff left shoe, Fasten right, Fasten left Shoes - Performed by helper: Fasten right Assist for footwear: Supervision/touching assist Assist for lower body dressing: Supervision or verbal cues  Function - Toileting Toileting steps completed by patient: Adjust clothing prior to toileting, Performs perineal hygiene, Adjust clothing after toileting Toileting steps completed by helper: Adjust clothing prior to toileting Toileting Assistive Devices: Grab bar or rail Assist level: Touching or steadying assistance (Pt.75%)  Function - Toilet Transfers Toilet transfer assistive device: Grab bar, Elevated toilet seat/BSC over toilet, Walker Assist level to toilet: Touching or steadying assistance (Pt > 75%) Assist level from toilet: Touching or steadying assistance (Pt > 75%) Assist level to bedside commode (at bedside): Touching or steadying assistance (Pt > 75%) Assist level from bedside commode (at bedside): Touching or steadying assistance (Pt > 75%)  Function - Chair/bed transfer Chair/bed transfer method: Ambulatory Chair/bed transfer assist level: Touching or steadying assistance (Pt > 75%) Chair/bed transfer assistive device: Armrests Chair/bed transfer details: Verbal cues for technique, Verbal cues for precautions/safety, Manual facilitation for weight shifting  Function - Locomotion: Wheelchair Will patient use wheelchair at discharge?: No Type: Manual Max wheelchair distance: 150' Assist Level: Supervision or verbal  cues Assist Level: Supervision or verbal cues Assist Level: Supervision or verbal cues Turns around,maneuvers to table,bed, and toilet,negotiates 3% grade,maneuvers on rugs and over doorsills: No Function -  Locomotion: Ambulation Assistive device: No device Max distance: 150' Assist level: Touching or steadying assistance (Pt > 75%) Assist level: Touching or steadying assistance (Pt > 75%) Walk 50 feet with 2 turns activity did not occur: Safety/medical concerns Assist level: Touching or steadying assistance (Pt > 75%) Walk 150 feet activity did not occur: Safety/medical concerns Assist level: Touching or steadying assistance (Pt > 75%) Walk 10 feet on uneven surfaces activity did not occur: Safety/medical concerns  Function - Comprehension Comprehension: Auditory Comprehension assist level: Understands complex 90% of the time/cues 10% of the time  Function - Expression Expression: Verbal Expression assist level: Expresses complex 90% of the time/cues < 10% of the time  Function - Social Interaction Social Interaction assist level: Interacts appropriately with others with medication or extra time (anti-anxiety, antidepressant).  Function - Problem Solving Problem solving assist level: Solves complex 90% of the time/cues < 10% of the time  Function - Memory Memory assist level: Recognizes or recalls 75 - 89% of the time/requires cueing 10 - 24% of the time Patient normally able to recall (first 3 days only): Current season, That he or she is in a hospital, Location of own room, Staff names and faces  Medical Problem List and Plan: 1. 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 Formulized individual treatment plan based on medical history, underlying problem and comorbidities. 2. DVT  Prophylaxis/Anticoagulation: Pharmaceutical:Other (comment)--Eliquis 3. Pain Management:tylenol prn. 4. Mood:LCSW to follow for evaluation and support. 5. Neuropsych: This patient RIght frontal infarct capable of making decisions on her own behalf.complains of severe family related stressor  6. Skin/Wound Care:Routine pressure relief measures. 7. Fluids/Electrolytes/Nutrition:Monitor I/O- wants dietary ed re heart healthy diet 8. CAF: Monitor HR bid. Controlled off medications. On Eliquis Vitals:   04/24/18 1955 04/25/18 0410  BP: 139/76 137/66  Pulse: 87 71  Resp: 19 18  Temp: 97.9 F (36.6 C) 98 F (36.7 C)  SpO2: 95% 96%  controlled HR on Lopressor 7/24 9. Spinal stenosis with radiculopathy RLE: Tylenol prn  10. HTN: Monitor BP bid--allow for adequate perfusion. Off metoprolol, amlodipine and losartan at this time.  11. Seizure prophylaxis: On Keppra bid. No documented seizures   13.GERD/H/o dysphagia/Schatzik's ring s/p dilatation/esophageal spasms:Resume home Zantac. 12. ?Spasticity:no physical sign will d/c baclofen. Tremor may be due to anxiety Schedule klonopin .74m BID 13 Hypoalb add prostat  LOS (Days) 6 A FACE TO FACE EVALUATION WAS PERFORMED  ACharlett Blake7/24/2019, 9:29 AM

## 2018-04-25 NOTE — Progress Notes (Signed)
Physical Therapy Session Note  Patient Details  Name: Isabel Hernandez MRN: [id number] Date of Birth: [birth year]  Today's Date: 04/25/2018 PT Individual Time: 1115-1200 PT Individual Time Calculation (min): 45 min   Short Term Goals: Week 1:  PT Short Term Goal 1 (Week 1): Pt will ambulate 25' w/ LRAD w/ min assist PT Short Term Goal 2 (Week 1): Pt will participate in 30 min of OOB activity w/o increase in fatigue PT Short Term Goal 3 (Week 1): Pt will transfer bed<>chair w/ min assist PT Short Term Goal 4 (Week 1): Pt will maintain dynamic standing balance during functional tasks w/ min assist      Skilled Therapeutic Interventions/Progress Updates:   Gait training with RW on level tile with RW with supervision.  Therapeutic activity in unsupported sitting reaching fL/R out of BOS for small objects to facilitate trunk shortening/lengthening/rotating.  Pt overshifted to R 2/6 trials, but recovered independently.  R fine motor task sitting with feet supported, manipulating small figures as fast as possible in 2 rows of 5, without knocking them over.  Pt improved with practice.  Balance challenge and sustained stretch standing on medium blue wedge x 1.5 minutes x 2.  Pt initially swayed backwards, but developed hip strategy with multimodal cues. Biased to R standing, during R hand fine motor activity using pegs to make design per 2 pictures, 100% accurately, x 30 seconds, x 70 seconds.  Sit>< stand with bil UEs across chest to fully load bil LEs, x 5 with sway to R noted.   Given external perturbations, pt demonstrates bil ankle strategies, absent bil hip strategy, and delayed R stepping strategy.  Bil hip and L stepping strategies emerged sporadically with practice and multimodal cues.  Gait training to return to room, transporting bulky object using bil hands, without AD, min assist, min cues for slower speed as R foot clearance was marginal at times.  Pt left resting in w/c with needs at  hand.     Therapy Documentation Precautions:  Precautions Precautions: Fall Restrictions Weight Bearing Restrictions: No  Pain: none per pt      See Function Navigator for Current Functional Status.   Therapy/Group: Individual Therapy  Shayne Diguglielmo 04/25/2018, 3:43 PM

## 2018-04-25 NOTE — Progress Notes (Signed)
Recreational Therapy Session Note  Patient Details  Name: Isabel Hernandez MRN: [id number] Date of Birth: 07-Feb-1941 Today's Date: 04/25/2018 Time:  1330-1430 Pain: no c/o Skilled Therapeutic Interventions/Progress Updates:  Goals include: >Pt will identify the definition of stress with min cues. >Pt will state >3 signs/symptoms of stress with min cues. >Pt will be introduced/provided resources for at least 2 relaxation techniques to assist with stress management.  Pt participated in group for 60 minutes addressing the above stated goals.  All goals met. Pt stated understanding and appreciation of information  Denielle Bayard 04/25/2018, 4:29 PM

## 2018-04-25 NOTE — Discharge Instructions (Addendum)
Inpatient Rehab Discharge Instructions  Isabel Hernandez Discharge date and time: 04/28/18   Activities/Precautions/ Functional Status: Activity: no lifting, driving, or strenuous exercise  till cleared by MD Diet: cardiac diet Wound Care: none needed   Functional status:  ___ No restrictions     ___ Walk up steps independently _X__ 24/7 supervision/assistance   ___ Walk up steps with assistance ___ Intermittent supervision/assistance  ___ Bathe/dress independently ___ Walk with walker     _X__ Bathe/dress with supervision.  ___ Walk Independently    ___ Shower independently ___ Walk with assistance    ___ Shower with assistance _X__ No alcohol     ___ Return to work/school ________  Special Instructions:  COMMUNITY REFERRALS UPON DISCHARGE:    Outpatient: PT & OT  Agency:[facility] [phone]   Date of Last Service:04/28/2018  Appointment Date/Time:WILL CONTACT YOU REGARDING SCHEDULING APPOINTMENTS  Medical Equipment/Items Ordered:ROLLING WALKER & TUB SEAT  Agency/Supplier:[facility]   [phone]   GENERAL COMMUNITY RESOURCES FOR PATIENT/FAMILY: Support Groups:[facility] SUPPORT GROUP CONTACT [phone] FOR TIMES AND PLACES   Information on my medicine - ELIQUIS (apixaban)  Why was Eliquis prescribed for you? Eliquis was prescribed for you to reduce the risk of a blood clot forming that can cause a stroke if you have a medical condition called atrial fibrillation (a type of irregular heartbeat).  What do You need to know about Eliquis ? Take your Eliquis TWICE DAILY - one tablet in the morning and one tablet in the evening with or without food. If you have difficulty swallowing the tablet whole please discuss with your pharmacist how to take the medication safely.  Take Eliquis exactly as prescribed by your doctor and DO NOT stop taking Eliquis without talking to the doctor who prescribed the medication.  Stopping may increase your risk of  developing a stroke.  Refill your prescription before you run out.  After discharge, you should have regular check-up appointments with your healthcare provider that is prescribing your Eliquis.  In the future your dose may need to be changed if your kidney function or weight changes by a significant amount or as you get older.  What do you do if you miss a dose? If you miss a dose, take it as soon as you remember on the same day and resume taking twice daily.  Do not take more than one dose of ELIQUIS at the same time to make up a missed dose.  Important Safety Information A possible side effect of Eliquis is bleeding. You should call your healthcare provider right away if you experience any of the following: ? Bleeding from an injury or your nose that does not stop. ? Unusual colored urine (red or dark brown) or unusual colored stools (red or black). ? Unusual bruising for unknown reasons. ? A serious fall or if you hit your head (even if there is no bleeding).  Some medicines may interact with Eliquis and might increase your risk of bleeding or clotting while on Eliquis. To help avoid this, consult your healthcare provider or pharmacist prior to using any new prescription or non-prescription medications, including herbals, vitamins, non-steroidal anti-inflammatory drugs (NSAIDs) and supplements.  This website has more information on Eliquis (apixaban): http://www.eliquis.com/eliquis/home  STROKE/TIA DISCHARGE INSTRUCTIONS SMOKING Cigarette smoking nearly doubles your risk of having a stroke & is the single most alterable risk factor  If you smoke or have smoked in the last 12 months, you are advised to quit smoking for your health.  Most of  the excess cardiovascular risk related to smoking disappears within a year of stopping.  Ask you doctor about anti-smoking medications  [place] Quit Line: 1-800-QUIT NOW  Free Smoking Cessation Classes [phone]  CHOLESTEROL Know your levels;  limit fat & cholesterol in your diet  Lipid Panel     Component Value Date/Time   CHOL 104 04/18/2018 0413   CHOL 133 09/05/2016 1410   TRIG 144 04/18/2018 0413   HDL 15 (L) 04/18/2018 0413   HDL 19 (L) 09/05/2016 1410   CHOLHDL 6.9 04/18/2018 0413   VLDL 29 04/18/2018 0413   LDLCALC 60 04/18/2018 0413   LDLCALC 81 09/05/2016 1410      Many patients benefit from treatment even if their cholesterol is at goal.  Goal: Total Cholesterol (CHOL) less than 160  Goal:  Triglycerides (TRIG) less than 150  Goal:  HDL greater than 40  Goal:  LDL (LDLCALC) less than 100   BLOOD PRESSURE American Stroke Association blood pressure target is less that 120/80 mm/Hg  Your discharge blood pressure is:  BP: 136/77  Monitor your blood pressure  Limit your salt and alcohol intake  Many individuals will require more than one medication for high blood pressure  DIABETES (A1c is a blood sugar average for last 3 months) Goal HGBA1c is under 7% (HBGA1c is blood sugar average for last 3 months)  Diabetes: No known diagnosis of diabetes    Lab Results  Component Value Date   HGBA1C 5.0 04/21/2018     Your HGBA1c can be lowered with medications, healthy diet, and exercise.  Check your blood sugar as directed by your physician  Call your physician if you experience unexplained or low blood sugars.  PHYSICAL ACTIVITY/REHABILITATION Goal is 30 minutes at least 4 days per week  Activity: No driving, Therapies: see above Return to work: N/A  Activity decreases your risk of heart attack and stroke and makes your heart stronger.  It helps control your weight and blood pressure; helps you relax and can improve your mood.  Participate in a regular exercise program.  Talk with your doctor about the best form of exercise for you (dancing, walking, swimming, cycling).  DIET/WEIGHT Goal is to maintain a healthy weight  Your discharge diet is:  Diet Order           Diet Heart Room service  appropriate? Yes; Fluid consistency: Thin  Diet effective now         liquids Your height is:  Height: 5\' 4"  (162.6 cm) Your current weight is: Weight: 74 kg (163 lb 2.3 oz) Your Body Mass Index (BMI) is:  BMI (Calculated): 27.99  Following the type of diet specifically designed for you will help prevent another stroke.  Your goal weight is:  145 lbs  Your goal Body Mass Index (BMI) is 19-24.  Healthy food habits can help reduce 3 risk factors for stroke:  High cholesterol, hypertension, and excess weight.  RESOURCES Stroke/Support Group:  Call [phone]   STROKE EDUCATION PROVIDED/REVIEWED AND GIVEN TO PATIENT Stroke warning signs and symptoms How to activate emergency medical system (call 911). Medications prescribed at discharge. Need for follow-up after discharge. Personal risk factors for stroke. Pneumonia vaccine given:  Flu vaccine given:  My questions have been answered, the writing is legible, and I understand these instructions.  I will adhere to these goals & educational materials that have been provided to me after my discharge from the hospital.    My questions have been answered and  I understand these instructions. I will adhere to these goals and the provided educational materials after my discharge from the hospital.  Patient/Caregiver Signature _______________________________ Date __________  Clinician Signature _______________________________________ Date __________  Please bring this form and your medication list with you to all your follow-up doctor's appointments.

## 2018-04-25 NOTE — Progress Notes (Signed)
Occupational Therapy Session Note  Patient Details  Name: Isabel Hernandez MRN: [id number] Date of Birth: 1940-11-14  Today's Date: 04/25/2018 OT Individual Time: 0930-1055 OT Individual Time Calculation (min): 85 min    Short Term Goals: Week 1:  OT Short Term Goal 1 (Week 1): Pt Will complete 2 grooming tasks standing at sink with CGA to increase functional standing balance/endurance OT Short Term Goal 2 (Week 1): Pt will complete functional transfers with steadying assist using LRAD OT Short Term Goal 3 (Week 1): Pt will complete 3/3 toileting tasks with CGA  Skilled Therapeutic Interventions/Progress Updates:    Pt resting in w/c upon arrival with husband present.  Pt declined bathing/dressing this morning and expressed desire to "work" and show her husband how much she has improved.  OT intervention with focus family education/discharge planning, functional amb with RW, standing balance, and BLE strengthening.  Pt amb with RW to ADL apartment and practiced simple kitchen tasks, bed mobility, and simple home management tasks.  Pt also practiced walk-in shower transfers and discussed recommended seat for shower.  Pt/husband will notify therapist/CSW if they wanted a seat furnished by [facility].  Discussed installation of grab bars in cottage.  Pt amb to Day Room and engaged in standing balance tasks on Wii balance board and [employer].  Pt continues to demonstrate delayed balance reactions.  Pt also engaged in BLE therex on NuStep (8 mins on work load 4). Pt returned to room and remained in w/c with husband present.  All needs within reach.   Therapy Documentation Precautions:  Precautions Precautions: Fall Restrictions Weight Bearing Restrictions: No Pain:  Pt denies pain  See Function Navigator for Current Functional Status.   Therapy/Group: Individual Therapy  Leroy Libman 04/25/2018, 10:55 AM

## 2018-04-26 ENCOUNTER — Encounter (HOSPITAL_COMMUNITY): Payer: Medicare Other | Admitting: *Deleted

## 2018-04-26 ENCOUNTER — Inpatient Hospital Stay (HOSPITAL_COMMUNITY): Payer: Medicare Other

## 2018-04-26 LAB — BASIC METABOLIC PANEL
Anion gap: 6 (ref 5–15)
BUN: 20 mg/dL (ref 8–23)
CALCIUM: 9.4 mg/dL (ref 8.9–10.3)
CO2: 28 mmol/L (ref 22–32)
CREATININE: 0.73 mg/dL (ref 0.44–1.00)
Chloride: 108 mmol/L (ref 98–111)
GFR calc Af Amer: >60 mL/min (ref >60)
Glucose, Bld: 98 mg/dL (ref 70–99)
Potassium: 3.9 mmol/L (ref 3.5–5.1)
Sodium: 142 mmol/L (ref 135–145)

## 2018-04-26 LAB — CBC
HEMATOCRIT: 34.3 % — AB (ref 36.0–46.0)
HEMOGLOBIN: 11.4 g/dL — AB (ref 12.0–15.0)
MCH: 34.3 pg — AB (ref 26.0–34.0)
MCHC: 33.2 g/dL (ref 30.0–36.0)
MCV: 103.3 fL — AB (ref 78.0–100.0)
Platelets: 128 10*3/uL — ABNORMAL LOW (ref 150–400)
RBC: 3.32 MIL/uL — ABNORMAL LOW (ref 3.87–5.11)
RDW: 13.2 % (ref 11.5–15.5)
WBC: 4.3 10*3/uL (ref 4.0–10.5)

## 2018-04-26 NOTE — Progress Notes (Signed)
Recreational Therapy Discharge Summary Patient Details  Name: Isabel Hernandez MRN: [id number] Date of Birth: [birth year] Today's Date: 04/26/2018   Comments on progress toward goals: Pt has made good progress toward goals and is ready for discharge home with husband to provide 24 hour supervision.  TR sessions focused on activity analysis with potential modifications, stress management/relaxation training & community reintegration.  All goals met. Reasons for discharge: discharge from hospital   Patient/family agrees with progress made and goals achieved: Yes  Torren Maffeo 04/26/2018, 8:30 AM

## 2018-04-26 NOTE — Patient Care Conference (Signed)
Inpatient RehabilitationTeam Conference and Plan of Care Update Date: 04/25/2018   Time: 11:20 AM    Patient Name: Isabel Hernandez      Medical Record Number: [id number]  Date of Birth: 1941-01-01 Sex: Female         Room/Bed: 4W26C/4W26C-01 Payor Info: Payor: MEDICARE / Plan: MEDICARE PART A AND B / Product Type: *No Product type* /    Admitting Diagnosis: cva  Admit Date/Time:  04/19/2018 12:32 PM Admission Comments: No comment available   Primary Diagnosis:  <principal problem not specified> Principal Problem: <principal problem not specified>  Patient Active Problem List   Diagnosis Date Noted  . Ischemic stroke of frontal lobe ([place]) 04/19/2018  . Right hemiparesis ([place])   . Chronic atrial fibrillation ([place])   . Gait disturbance, post-stroke   . UTI (urinary tract infection) 04/17/2018  . Acute CVA (cerebrovascular accident) ([place]) 04/17/2018  . Hip flexor tendinitis 09/08/2017  . 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 Hand or foot spasms 05/09/2017  . Tremor 05/09/2017  . Sinusitis 04/28/2017  . Lumbar spinal stenosis 03/01/2017  . Lumbar radiculopathy, right 01/30/2017  . Right hip pain 08/19/2016  . Pneumonitis 05/19/2016  . GERD (gastroesophageal reflux disease) 05/19/2016  . NSAID long-term use 03/22/2016  . Primary osteoarthritis of left knee 03/22/2016  . Primary osteoarthritis of right hand 03/22/2016  . Rheumatoid factor positive 03/22/2016  . Essential hypertension 02/17/2016  . Carotid stenosis 02/17/2016  . Abnormal chest x-ray 02/17/2016  . Hyperlipidemia 02/17/2016  . Allergic rhinitis 02/09/2016    Expected Discharge Date: Expected Discharge Date: 04/28/18  Team Members Present: Physician leading conference: Dr. Alysia Penna Social Worker Present: Ovidio Kin, LCSW Nurse Present: Dorthula Nettles, RN PT Present: Leavy Cella, PT OT Present: Willeen Cass, OT;Roanna Epley, COTA SLP Present: Weston Anna, SLP PPS Coordinator present : Daiva Nakayama, RN, CRRN     Current Status/Progress Goal Weekly Team Focus  Medical   strength improving, anxiety better on schedule klonopin as are tremors  Home d/c, reduce fall risk  D/C planning   Bowel/Bladder   continent B/B, LBM 7/23  min-mod assist  Assess B&B needs q shift and PRN.    Swallow/Nutrition/ Hydration             ADL's   supervision bathing, toileting, dressing; min A for bathroom transfers  independent overall, except for S with bathing and CGA with shower stall transfers  ADL training, functional mobility, balance, posture, R shoulder strength, pt education   Mobility   supervision to min guard w/o AD, >150'   supervision household gait, Mod I transfers  discharge planning/family education, higher level balance, overall endurance,    Communication             Safety/Cognition/ Behavioral Observations            Pain   patient has denies pain  pain <=3  assess pain q shift and PRN, medicate as needed   Skin   no skin issues  remain infection/breakdown free  assess skin q shift and PRN      *See Care Plan and progress notes for long and short-term goals.     Barriers to Discharge  Current Status/Progress Possible Resolutions Date Resolved   Physician    Medical stability     progressing toward goals  cont rehab , caregiver ed      Nursing  PT                    OT                  SLP                SW                Discharge Planning/Teaching Needs:  HOme with husband who can provide supervision level-here today for family education.      Team Discussion:  Reaching goals of supervision-mod/i level. Husband here to attend therapies today and learn her care. Outing tomorrow and team recommends OP therapies. Pt pleased with her progress and ready to go home Sat.  Revisions to Treatment Plan:  DC 7/27    Continued Need for Acute Rehabilitation Level of Care: The patient requires daily medical management by a physician with  specialized training in physical medicine and rehabilitation for the following conditions: Daily direction of a multidisciplinary physical rehabilitation program to ensure safe treatment while eliciting the highest outcome that is of practical value to the patient.: Yes Daily medical management of patient stability for increased activity during participation in an intensive rehabilitation regime.: Yes Daily analysis of laboratory values and/or radiology reports with any subsequent need for medication adjustment of medical intervention for : Neurological problems;Blood pressure problems  Darrill Vreeland, Gardiner Rhyme 04/26/2018, 10:12 AM

## 2018-04-26 NOTE — Progress Notes (Signed)
Recreational Therapy Session Note  Patient Details  Name: Isabel Hernandez MRN: [id number] Date of Birth: 08/16/1941 Today's Date: 04/26/2018 Time: 1000-1130 Pain: no c/o Skilled Therapeutic Interventions/Progress Updates: Pt participated in community reintegration/outing to [employer] at [employer] supervision ambulatory level using RW.  Goals focused on safe community mobility, identification & negotiation of obstacles, accessing public restroom, energy conservation techniques/education.  See outing goal sheet in shadow chart for full details.   Therapy/Group: [employer]  Kaila Devries 04/26/2018, 8:29 AM

## 2018-04-26 NOTE — Progress Notes (Signed)
Nutrition Consult/Brief Note  RD consulted for nutrition education regarding a Heart Healthy diet.   Pt is out of her room upon RD visit today. Per RN pt "went shopping".  Lipid Panel     Component Value Date/Time   CHOL 104 04/18/2018 0413   CHOL 133 09/05/2016 1410   TRIG 144 04/18/2018 0413   HDL 15 (L) 04/18/2018 0413   HDL 19 (L) 09/05/2016 1410   CHOLHDL 6.9 04/18/2018 0413   VLDL 29 04/18/2018 0413   LDLCALC 60 04/18/2018 0413   LDLCALC 81 09/05/2016 1410   RD left "Heart Healthy Nutrition Therapy" handout from the Academy of Nutrition and Dietetics on pt's tray table.  Body mass index is 28 kg/m. Pt meets criteria for Overweight based on current BMI.  Current diet order is Heart Healthy, patient is consuming approximately 75% of meals at this time. Labs and medications reviewed.   No further nutrition interventions warranted at this time. Will try and revisit at later date.  Arthur Holms, RD, LDN Pager #: [phone] After-Hours Pager #: [phone]

## 2018-04-26 NOTE — Progress Notes (Signed)
Social Work   Virna Livengood, Eliezer Champagne  Social Worker  Physical Medicine and Rehabilitation  Patient Care Conference  Signed  Date of Service:  04/26/2018  8:19 AM          Signed          Show:Clear all [x] Manual[x] Template[] Copied  Added by: [x] Alyria Krack, Gardiner Rhyme, LCSW   [] Hover for details   Inpatient RehabilitationTeam Conference and Plan of Care Update Date: 04/25/2018   Time: 11:20 AM      Patient Name: Elandra Powell      Medical Record Number: [id number]  Date of Birth: 04/07/1941 Sex: Female         Room/Bed: 4W26C/4W26C-01 Payor Info: Payor: MEDICARE / Plan: MEDICARE PART A AND B / Product Type: *No Product type* /     Admitting Diagnosis: cva  Admit Date/Time:  04/19/2018 12:32 PM Admission Comments: No comment available    Primary Diagnosis:  <principal problem not specified> Principal Problem: <principal problem not specified>       Patient Active Problem List    Diagnosis Date Noted  . Ischemic stroke of frontal lobe ([place]) 04/19/2018  . Right hemiparesis ([place])    . Chronic atrial fibrillation ([place])    . Gait disturbance, post-stroke    . UTI (urinary tract infection) 04/17/2018  . Acute CVA (cerebrovascular accident) ([place]) 04/17/2018  . Hip flexor tendinitis 09/08/2017  . 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 Hand or foot spasms 05/09/2017  . Tremor 05/09/2017  . Sinusitis 04/28/2017  . Lumbar spinal stenosis 03/01/2017  . Lumbar radiculopathy, right 01/30/2017  . Right hip pain 08/19/2016  . Pneumonitis 05/19/2016  . GERD (gastroesophageal reflux disease) 05/19/2016  . NSAID long-term use 03/22/2016  . Primary osteoarthritis of left knee 03/22/2016  . Primary osteoarthritis of right hand 03/22/2016  . Rheumatoid factor positive 03/22/2016  . Essential hypertension 02/17/2016  . Carotid stenosis 02/17/2016  . Abnormal chest x-ray 02/17/2016  . Hyperlipidemia 02/17/2016  . Allergic rhinitis 02/09/2016      Expected Discharge Date:  Expected Discharge Date: 04/28/18   Team Members Present: Physician leading conference: Dr. Alysia Penna Social Worker Present: Ovidio Kin, LCSW Nurse Present: Dorthula Nettles, RN PT Present: Leavy Cella, PT OT Present: Willeen Cass, OT;Roanna Epley, COTA SLP Present: Weston Anna, SLP PPS Coordinator present : Daiva Nakayama, RN, CRRN       Current Status/Progress Goal Weekly Team Focus  Medical     strength improving, anxiety better on schedule klonopin as are tremors  Home d/c, reduce fall risk  D/C planning   Bowel/Bladder     continent B/B, LBM 7/23  min-mod assist  Assess B&B needs q shift and PRN.    Swallow/Nutrition/ Hydration               ADL's     supervision bathing, toileting, dressing; min A for bathroom transfers  independent overall, except for S with bathing and CGA with shower stall transfers  ADL training, functional mobility, balance, posture, R shoulder strength, pt education   Mobility     supervision to min guard w/o AD, >150'   supervision household gait, Mod I transfers  discharge planning/family education, higher level balance, overall endurance,    Communication               Safety/Cognition/ Behavioral Observations             Pain     patient has denies pain  pain <=3  assess pain  q shift and PRN, medicate as needed   Skin     no skin issues  remain infection/breakdown free  assess skin q shift and PRN     *See Care Plan and progress notes for long and short-term goals.      Barriers to Discharge   Current Status/Progress Possible Resolutions Date Resolved   Physician     Medical stability     progressing toward goals  cont rehab , caregiver ed      Nursing                 PT                    OT                 SLP            SW              Discharge Planning/Teaching Needs:  HOme with husband who can provide supervision level-here today for family education.      Team Discussion:  Reaching goals of supervision-mod/i  level. Husband here to attend therapies today and learn her care. Outing tomorrow and team recommends OP therapies. Pt pleased with her progress and ready to go home Sat.  Revisions to Treatment Plan:  DC 7/27    Continued Need for Acute Rehabilitation Level of Care: The patient requires daily medical management by a physician with specialized training in physical medicine and rehabilitation for the following conditions: Daily direction of a multidisciplinary physical rehabilitation program to ensure safe treatment while eliciting the highest outcome that is of practical value to the patient.: Yes Daily medical management of patient stability for increased activity during participation in an intensive rehabilitation regime.: Yes Daily analysis of laboratory values and/or radiology reports with any subsequent need for medication adjustment of medical intervention for : Neurological problems;Blood pressure problems   Lorenna Lurry, Gardiner Rhyme 04/26/2018, 10:12 AM                 Patient ID: Weyman Croon, female   DOB: 11/25/1940, 77 y.o.   MRN: [id number]

## 2018-04-26 NOTE — Progress Notes (Signed)
Subjective/Complaints: No issues overnite Discussed Afib treament and monitoring Discussed blood work, rec to repeat CBC as OP    ROS- no CP, SOB, N/V/D Objective: Vital Signs: Blood pressure 134/73, pulse 62, temperature 97.9 F (36.6 C), resp. rate 14, height 5' 4"  (1.626 m), weight 74 kg (163 lb 2.3 oz), SpO2 94 %. No results found. Results for orders placed or performed during the hospital encounter of 04/19/18 (from the past 72 hour(s))  CBC     Status: Abnormal   Collection Time: 04/26/18  4:52 AM  Result Value Ref Range   WBC 4.3 4.0 - 10.5 K/uL   RBC 3.32 (L) 3.87 - 5.11 MIL/uL   Hemoglobin 11.4 (L) 12.0 - 15.0 g/dL   HCT 34.3 (L) 36.0 - 46.0 %   MCV 103.3 (H) 78.0 - 100.0 fL   MCH 34.3 (H) 26.0 - 34.0 pg   MCHC 33.2 30.0 - 36.0 g/dL   RDW 13.2 11.5 - 15.5 %   Platelets 128 (L) 150 - 400 K/uL    Comment: Performed at [facility], [place] [address], [place], [place] [address]  Basic metabolic panel     Status: None   Collection Time: 04/26/18  4:52 AM  Result Value Ref Range   Sodium 142 135 - 145 mmol/L   Potassium 3.9 3.5 - 5.1 mmol/L   Chloride 108 98 - 111 mmol/L   CO2 28 22 - 32 mmol/L   Glucose, Bld 98 70 - 99 mg/dL   BUN 20 8 - 23 mg/dL   Creatinine, Ser 0.73 0.44 - 1.00 mg/dL   Calcium 9.4 8.9 - 10.3 mg/dL   GFR calc non Af Amer >60 >60 mL/min   GFR calc Af Amer >60 >60 mL/min    Comment: (NOTE) The eGFR has been calculated using the CKD EPI equation. This calculation has not been validated in all clinical situations. eGFR's persistently <60 mL/min signify possible Chronic Kidney Disease.    Anion gap 6 5 - 15    Comment: Performed at [facility] [address] [place], [place] [address]     HEENT: normal Cardio: RRR and no murmur Resp: CTA B/L and unlabored GI: reduced BS, NT, ND Extremity:  No Edema Skin:   Other no incisional drainage Neuro: Alert/Oriented, Normal Sensory and Abnormal Motor 5/5 in BUE, 4/5 in BLE Musc/Skel:  Other  no pain with UE or LE ROM GEN NAD   Assessment/Plan: 1. Functional deficits secondary to RIght frontal infarct which require 3+ hours per day of interdisciplinary therapy in a comprehensive inpatient rehab setting. Physiatrist is providing close team supervision and 24 hour management of active medical problems listed below. Physiatrist and rehab team continue to assess barriers to discharge/monitor patient progress toward functional and medical goals. FIM: Function - Bathing Bathing activity did not occur: Refused Position: Shower Body parts bathed by patient: Right arm, Right lower leg, Left arm, Left lower leg, Chest, Abdomen, Front perineal area, Buttocks, Right upper leg, Left upper leg Body parts bathed by helper: Back Assist Level: Supervision or verbal cues  Function- Upper Body Dressing/Undressing What is the patient wearing?: Pull over shirt/dress Pull over shirt/dress - Perfomed by patient: Thread/unthread right sleeve, Thread/unthread left sleeve, Put head through opening, Pull shirt over trunk Assist Level: Supervision or verbal cues, Set up Set up : To obtain clothing/put away Function - Lower Body Dressing/Undressing What is the patient wearing?: Underwear, Pants, Shoes Position: Other (comment)(sitting on toilet) Underwear - Performed by  patient: Thread/unthread right underwear leg, Thread/unthread left underwear leg, Pull underwear up/down Pants- Performed by patient: Thread/unthread right pants leg, Thread/unthread left pants leg, Pull pants up/down Pants- Performed by helper: Thread/unthread right pants leg, Thread/unthread left pants leg Non-skid slipper socks- Performed by patient: Don/doff right sock, Don/doff left sock Shoes - Performed by patient: Don/doff right shoe, Don/doff left shoe, Fasten right, Fasten left Shoes - Performed by helper: Fasten right Assist for footwear: Supervision/touching assist Assist for lower body dressing: Supervision or verbal  cues  Function - Toileting Toileting steps completed by patient: Adjust clothing prior to toileting, Performs perineal hygiene, Adjust clothing after toileting Toileting steps completed by helper: Adjust clothing prior to toileting Toileting Assistive Devices: Grab bar or rail Assist level: Touching or steadying assistance (Pt.75%)  Function - Toilet Transfers Toilet transfer assistive device: Grab bar, Elevated toilet seat/BSC over toilet, Walker Assist level to toilet: Touching or steadying assistance (Pt > 75%) Assist level from toilet: Touching or steadying assistance (Pt > 75%) Assist level to bedside commode (at bedside): Touching or steadying assistance (Pt > 75%) Assist level from bedside commode (at bedside): Touching or steadying assistance (Pt > 75%)  Function - Chair/bed transfer Chair/bed transfer method: Ambulatory Chair/bed transfer assist level: Supervision or verbal cues Chair/bed transfer assistive device: Armrests, Walker Chair/bed transfer details: Verbal cues for technique, Verbal cues for precautions/safety  Function - Locomotion: Wheelchair Will patient use wheelchair at discharge?: No Type: Manual Max wheelchair distance: 150' Assist Level: Supervision or verbal cues Assist Level: Supervision or verbal cues Assist Level: Supervision or verbal cues Turns around,maneuvers to table,bed, and toilet,negotiates 3% grade,maneuvers on rugs and over doorsills: No Function - Locomotion: Ambulation Assistive device: Walker-rolling Max distance: 150' Assist level: Supervision or verbal cues Assist level: Supervision or verbal cues Walk 50 feet with 2 turns activity did not occur: Safety/medical concerns Assist level: Supervision or verbal cues Walk 150 feet activity did not occur: Safety/medical concerns Assist level: Supervision or verbal cues Walk 10 feet on uneven surfaces activity did not occur: Safety/medical concerns  Function - Comprehension Comprehension:  Auditory Comprehension assist level: Understands complex 90% of the time/cues 10% of the time  Function - Expression Expression: Verbal Expression assist level: Expresses complex 90% of the time/cues < 10% of the time  Function - Social Interaction Social Interaction assist level: Interacts appropriately with others with medication or extra time (anti-anxiety, antidepressant).  Function - Problem Solving Problem solving assist level: Solves complex 90% of the time/cues < 10% of the time  Function - Memory Memory assist level: Recognizes or recalls 90% of the time/requires cueing < 10% of the time Patient normally able to recall (first 3 days only): Current season, That he or she is in a hospital, Location of own room, Staff names and faces  Medical Problem List and Plan: 1. 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 DVT Prophylaxis/Anticoagulation: Pharmaceutical:Other (comment)--Eliquis 3. Pain Management:tylenol prn. 4. Mood:LCSW to follow for evaluation and support. 5. Neuropsych: This patient RIght frontal infarct capable of making decisions on her own behalf.complains of severe family related stressor  6. Skin/Wound Care:Routine pressure relief measures. 7. Fluids/Electrolytes/Nutrition:Monitor I/O- 8. CAF: Monitor HR bid. Controlled off medications. On Eliquis Vitals:   04/25/18 1926 04/26/18 0626  BP: (!) 145/74 134/73  Pulse: 83 62  Resp: 17 14  Temp: 98.1 F (36.7 C) 97.9 F (36.6 C)  SpO2: 93% 94%  controlled HR on Lopressor 7/25, BP within range as  well  9. Spinal stenosis with radiculopathy RLE: Tylenol prn  10. HTN: Monitor BP bid--allow for adequate perfusion. Off metoprolol, amlodipine and losartan at this time.  11. Seizure prophylaxis: On Keppra bid. No documented seizures   13.GERD/H/o dysphagia/Schatzik's ring s/p dilatation/esophageal spasms:Resume home Zantac. 12. ?Spasticity:no  physical sign will d/c baclofen. Tremor may be due to anxiety Schedule klonopin .71m BID 13 Hypoalb add prostat  LOS (Days) 7 A FACE TO FACE EVALUATION WAS PERFORMED  ACharlett Blake7/25/2019, 9:52 AM

## 2018-04-26 NOTE — Progress Notes (Signed)
Occupational Therapy Session Note  Patient Details  Name: Isabel Hernandez MRN: [id number] Date of Birth: 11/10/1940  Today's Date: 04/26/2018 OT Individual Time: 1000-1130 OT Individual Time Calculation (min): 90 min    Short Term Goals: Week 1:  OT Short Term Goal 1 (Week 1): Pt Will complete 2 grooming tasks standing at sink with CGA to increase functional standing balance/endurance OT Short Term Goal 2 (Week 1): Pt will complete functional transfers with steadying assist using LRAD OT Short Term Goal 3 (Week 1): Pt will complete 3/3 toileting tasks with CGA  Skilled Therapeutic Interventions/Progress Updates:    Cotreatment with Recreational Therapist.  Pt participated in community reintegration outing to local grocery store.  Focus on safety awareness, functional amb with RW in community setting, energy conservation strategies, RUE use for functional tasks, and activity tolerance.  Pt amb with grocery cart to gather items from shopping list.  Pt requested standing rest breaks X 4 during outing.  Pt able to retrieve items from shelving at various heights in safe manner and no LOB. Discussed community safety and energy conservation strategies.  Goal sheet placed in shadow chart. Pt returned to room and remained in w/c with all needs within reach.   Therapy Documentation Precautions:  Precautions Precautions: Fall Restrictions Weight Bearing Restrictions: No Pain: Pain Assessment Pain Scale: 0-10 Pain Score: 0-No pain  See Function Navigator for Current Functional Status.   Therapy/Group: Individual Therapy  Leroy Libman 04/26/2018, 12:03 PM

## 2018-04-26 NOTE — Progress Notes (Signed)
Physical Therapy Session Note  Patient Details  Name: Isabel Hernandez MRN: [id number] Date of Birth: [birth year]  Today's Date: 04/26/2018 PT Individual Time: 3614-4315 PT Individual Time Calculation (min): 73 min   Short Term Goals: Week 1:  PT Short Term Goal 1 (Week 1): Pt will ambulate 25' w/ LRAD w/ min assist PT Short Term Goal 2 (Week 1): Pt will participate in 30 min of OOB activity w/o increase in fatigue PT Short Term Goal 3 (Week 1): Pt will transfer bed<>chair w/ min assist PT Short Term Goal 4 (Week 1): Pt will maintain dynamic standing balance during functional tasks w/ min assist  Skilled Therapeutic Interventions/Progress Updates:    Supervision with RW for gait in and out of bathroom to address toileting needs. Pt completed hygiene and clothing management modified independent. Floor transfers for fall recovery education and focused on NMR in quadruped to address trunk control and strength, transitional movements, and coordination. See below for HEP issued to pt. Completed 5 reps each x 2 sets with verbal and tactile cues for technique. Min assist for transitional movements and floor transfers.  Exercises  Cat-Camel - 10 reps - 2 sets - 1x daily - 7x weekly  Quadruped Alternating Arm Lift - 5-10 reps - 2 sets - 1x daily - 7x weekly  Quadruped Alternating Leg Extensions - 5-10 reps - 2 sets - 1x daily - 7x weekly  Child's Pose Stretch - 3 reps - 1 sets - 30 sec hold - 1x daily - 7x weekly    Stair negotiation for functional strengthening and community mobility training with close supervision to steadying assist using bilateral rails for support x 8 steps (6" height).  NMR for higher level balance re-training:  * sit <> stands while holding tray with stacked cups x 5 reps with supervision and progressed to gait while holding this same configuration with overall min assist and verbal cues for increased step length on RLE, noted shuffled gait pattern  * Dynamic gait while kicking  yoga block with RLE and then also with LLE  * Dynavision while on compliant surface in Mode B initially with just red lights and progressed to red and green lights with directions to use R hand for R lights and L hand for L lights in all planes (min assist overall for balance).   Five times Sit to Stand Test (FTSS) Method: Use a straight back chair with a solid seat that is 16-18" high. Ask participant to sit on the chair with arms folded across their chest.   Instructions: "Stand up and sit down as quickly as possible 5 times, keeping your arms folded across your chest."   Measurement: Stop timing when the participant stands the 5th time.  TIME: ____14__ (in seconds)  Times > 13.6 seconds is associated with increased disability and morbidity (Guralnik, 2000) Times > 15 seconds is predictive of recurrent falls in healthy individuals aged 76 and older (Buatois, et al., 2008) Normal performance values in community dwelling individuals aged 41 and older (Bohannon, 2006): o 60-69 years: 11.4 seconds o 70-79 years: 12.6 seconds o 80-89 years: 14.8 seconds  MCID: ? 2.3 seconds for Vestibular Disorders Mariah Milling, 2006)   Therapy Documentation Precautions:  Precautions Precautions: Fall Restrictions Weight Bearing Restrictions: No  Pain:  No complaints.    See Function Navigator for Current Functional Status.   Therapy/Group: Individual Therapy  Canary Brim Ivory Broad, PT, DPT  04/26/2018, 4:03 PM

## 2018-04-26 NOTE — Progress Notes (Signed)
Occupational Therapy Session Note  Patient Details  Name: Isabel Hernandez MRN: [id number] Date of Birth: 11/10/1940  Today's Date: 04/26/2018 OT Individual Time: 0930-1000 OT Individual Time Calculation (min): 30 min    Short Term Goals: Week 1:  OT Short Term Goal 1 (Week 1): Pt Will complete 2 grooming tasks standing at sink with CGA to increase functional standing balance/endurance OT Short Term Goal 2 (Week 1): Pt will complete functional transfers with steadying assist using LRAD OT Short Term Goal 3 (Week 1): Pt will complete 3/3 toileting tasks with CGA  Skilled Therapeutic Interventions/Progress Updates:    OT intervention with focus on funcitonal amb with RW, standing balance, BADL retraining, and safety awareness to increase independence with BADLs.  Pt amb with RW into bathroom and completed all bathing/dressing tasks at supervision level with sit<>stand in shower and for LB dressing tasks.  Pt returned to room and completed grooming tasks standing at sink.  Pt requested to use toilet and amb with RW for toileting tasks at supervision level.  Pt exhibited no safety issues or LOB during session.  Pt remained in w/c with all needs within reach.   Therapy Documentation Precautions:  Precautions Precautions: Fall Restrictions Weight Bearing Restrictions: No   Pain: Pain Assessment Pain Scale: 0-10 Pain Score: 0-No pain  See Function Navigator for Current Functional Status.   Therapy/Group: Individual Therapy  Leroy Libman 04/26/2018, 12:02 PM

## 2018-04-26 NOTE — Plan of Care (Signed)
  Problem: Consults Goal: RH STROKE PATIENT EDUCATION Description: See Patient Education module for education specifics  Outcome: Progressing   

## 2018-04-26 NOTE — Progress Notes (Signed)
Social Work  Discharge Note  The overall goal for the admission was met for: DC-SAT 7/27  Discharge location: Yes-HOME WITH HUSBAND WHO CAN PROVIDE SUPERVISION LEVEL  Length of Stay: Yes-9 DAYS  Discharge activity level: Yes-SUPERVISION LEVEL  Home/community participation: Yes  Services provided included: MD, RD, PT, OT, SLP, RN, CM, TR, Pharmacy and SW  Financial Services: Medicare and Private Insurance: [place]  Follow-up services arranged: Outpatient: [facility]-OPPT & OT WILL CONTACT YOU TO SET UP APPOINTMENTS, DME: [facility] and Patient/Family has no preference for HH/DME agencies  Comments (or additional information):HUSBAND HAS BEEN HERE AND LEARNED HER CARE. BOTH FEEL READY TO GO HOME SAT.   Patient/Family verbalized understanding of follow-up arrangements: Yes  Individual responsible for coordination of the follow-up plan: SELF & PHILIP-HUSBAND  Confirmed correct DME delivered: Elease Hashimoto 04/26/2018    Elease Hashimoto

## 2018-04-27 ENCOUNTER — Inpatient Hospital Stay (HOSPITAL_COMMUNITY): Payer: Medicare Other | Admitting: Physical Therapy

## 2018-04-27 ENCOUNTER — Inpatient Hospital Stay (HOSPITAL_COMMUNITY): Payer: Medicare Other | Admitting: Occupational Therapy

## 2018-04-27 ENCOUNTER — Inpatient Hospital Stay (HOSPITAL_COMMUNITY): Payer: Medicare Other

## 2018-04-27 MED ORDER — ATORVASTATIN CALCIUM 40 MG PO TABS: 40.0000 mg | ORAL_TABLET | Freq: Every day | ORAL | 0 refills | Status: DC

## 2018-04-27 MED ORDER — METOPROLOL TARTRATE 25 MG PO TABS: 12.5000 mg | ORAL_TABLET | Freq: Two times a day (BID) | ORAL | 0 refills | Status: DC

## 2018-04-27 MED ORDER — SENNOSIDES-DOCUSATE SODIUM 8.6-50 MG PO TABS: 2.0000 | ORAL_TABLET | Freq: Every evening | ORAL | 0 refills | Status: DC | PRN

## 2018-04-27 MED ORDER — CLONAZEPAM 0.25 MG PO TBDP: 0.2500 mg | ORAL_TABLET | Freq: Two times a day (BID) | ORAL | 0 refills | Status: DC

## 2018-04-27 MED ORDER — APIXABAN 5 MG PO TABS: 5.0000 mg | ORAL_TABLET | Freq: Two times a day (BID) | ORAL | 0 refills | Status: DC

## 2018-04-27 MED ORDER — FAMOTIDINE 20 MG PO TABS: 20.0000 mg | ORAL_TABLET | Freq: Two times a day (BID) | ORAL | 0 refills | Status: DC

## 2018-04-27 MED ORDER — LEVETIRACETAM 500 MG PO TABS: 500.0000 mg | ORAL_TABLET | Freq: Two times a day (BID) | ORAL | 0 refills | Status: DC

## 2018-04-27 NOTE — Progress Notes (Signed)
Physical Therapy Session Note  Patient Details  Name: Isabel Hernandez MRN: [id number] Date of Birth: Oct 09, 1940  Today's Date: 04/27/2018 PT Individual Time: 5400-8676 PT Individual Time Calculation (min): 53 min   Short Term Goals: Week 1:  PT Short Term Goal 1 (Week 1): Pt will ambulate 25' w/ LRAD w/ min assist PT Short Term Goal 2 (Week 1): Pt will participate in 30 min of OOB activity w/o increase in fatigue PT Short Term Goal 3 (Week 1): Pt will transfer bed<>chair w/ min assist PT Short Term Goal 4 (Week 1): Pt will maintain dynamic standing balance during functional tasks w/ min assist  Skilled Therapeutic Interventions/Progress Updates:  Pt received in w/c and agreeable to tx denying c/o pain. Pt ambulates room<>gym with RW & supervision. Upon pt's request, provided pt with exercises to perform with theraputty. Also educated pt on how to fold/unfold RW. Pt completed Berg Balance Test & scored [phone]; educated pt on interpretation of score, current fall risk, and safety recommendations (clear pathways in home, use RW at all times). Patient demonstrates increased fall risk as noted by score of 44/56 on Berg Balance Scale.  (<36= high risk for falls, close to 100%; 37-45 significant >80%; 46-51 moderate >50%; 52-55 lower >25%). Pt requesting to practice floor transfer again but states she has only transferred from mat<>tall kneeling on floor. With encouragement pt transferred to lying on floor but reports being startled and nervous 2/2 BLE giving out during transitional movement. Educated pt to crawl towards sturdy surface and take tall kneeling break if necessary before transferring to sitting on mat table. Pt able to complete overall transfer mat<>floor with min assist. Educated pt to have someone with her when ambulating to dining room in the evenings with pt voicing understanding. Pt left with all needs within reach.    Therapy Documentation Precautions:  Precautions Precautions:  Fall Restrictions Weight Bearing Restrictions: No  Balance: Balance Balance Assessed: Yes Standardized Balance Assessment Standardized Balance Assessment: Berg Balance Test Berg Balance Test Sit to Stand: Able to stand without using hands and stabilize independently Standing Unsupported: Able to stand safely 2 minutes Sitting with Back Unsupported but Feet Supported on Floor or Stool: Able to sit safely and securely 2 minutes Stand to Sit: Sits safely with minimal use of hands Transfers: Able to transfer safely, minor use of hands Standing Unsupported with Eyes Closed: Able to stand 10 seconds safely Standing Ubsupported with Feet Together: Able to place feet together independently and stand 1 minute safely From Standing, Reach Forward with Outstretched Arm: Can reach forward >12 cm safely (5") From Standing Position, Pick up Object from Floor: Able to pick up shoe safely and easily From Standing Position, Turn to Look Behind Over each Shoulder: Looks behind one side only/other side shows less weight shift Turn 360 Degrees: Able to turn 360 degrees safely but slowly Standing Unsupported, Alternately Place Feet on Step/Stool: Able to complete >2 steps/needs minimal assist(completes 3 steps with min assist) Standing Unsupported, One Foot in Front: Able to take small step independently and hold 30 seconds Standing on One Leg: Tries to lift leg/unable to hold 3 seconds but remains standing independently Total Score: 44   See Function Navigator for Current Functional Status.   Therapy/Group: Individual Therapy  Waunita Schooner 04/27/2018, 3:23 PM

## 2018-04-27 NOTE — Progress Notes (Signed)
Physical Therapy Session Note  Patient Details  Name: Isabel Hernandez MRN: [id number] Date of Birth: 01/29/1941  Today's Date: 04/27/2018 PT Individual Time: 1100-1130 PT Individual Time Calculation (min): 30 min   Short Term Goals: Week 1:  PT Short Term Goal 1 (Week 1): Pt will ambulate 25' w/ LRAD w/ min assist PT Short Term Goal 2 (Week 1): Pt will participate in 30 min of OOB activity w/o increase in fatigue PT Short Term Goal 3 (Week 1): Pt will transfer bed<>chair w/ min assist PT Short Term Goal 4 (Week 1): Pt will maintain dynamic standing balance during functional tasks w/ min assist  Skilled Therapeutic Interventions/Progress Updates:    Pt seated in w/c upon PT arrival, agreeable to therapy tx and denies pain. Pt performed transfers within room Mod I using RW. Pt ambulated from room>ortho gym x 250 ft with RW and supervision. Pt performed car transfer with RW and supervision. Pt ambulated to the rehab apartment with RW and supervision, transferred on/off bed Mod I and performed bed mobility Mod I. Pt ambulated to gym with RW and supervision x 80 ft. Therapist performed strength, sensation, and coordination testing as detailed in discharge summary. Pt ambulated back to room and left seated with needs in reach.   Therapy Documentation Precautions:  Precautions Precautions: Fall Restrictions Weight Bearing Restrictions: No   See Function Navigator for Current Functional Status.   Therapy/Group: Individual Therapy  Netta Corrigan, PT, DPT 04/27/2018, 7:51 AM

## 2018-04-27 NOTE — Discharge Summary (Signed)
Physician Discharge Summary  Patient ID: Isabel Hernandez MRN: [id number] DOB/AGE: 1941-03-28 77 y.o.  Admit date: 04/19/2018 Discharge date: 04/28/2018  Discharge Diagnoses:  Principal Problem:   Ischemic stroke of frontal lobe [facility]) Active Problems:   Essential hypertension   Tremor   Right hemiparesis (HCC)   Chronic atrial fibrillation (HCC)   Gait disturbance, post-stroke   PAF (paroxysmal atrial fibrillation) (HCC)   Thrombocytopenia (HCC)   Acute blood loss anemia   Discharged Condition: Stable   Significant Diagnostic Studies: N/A   Labs:  Basic Metabolic Panel: BMP Latest Ref Rng & Units 04/26/2018 04/20/2018 04/17/2018  Glucose 70 - 99 mg/dL 98 116(H) -  BUN 8 - 23 mg/dL 20 5(L) -  Creatinine 0.44 - 1.00 mg/dL 0.73 0.72 0.82  Sodium 135 - 145 mmol/L 142 143 -  Potassium 3.5 - 5.1 mmol/L 3.9 3.5 -  Chloride 98 - 111 mmol/L 108 109 -  CO2 22 - 32 mmol/L 28 26 -  Calcium 8.9 - 10.3 mg/dL 9.4 9.3 -     CBC: CBC Latest Ref Rng & Units 04/26/2018 04/20/2018 04/17/2018  WBC 4.0 - 10.5 K/uL 4.3 4.2 5.4  Hemoglobin 12.0 - 15.0 g/dL 11.4(L) 11.7(L) 12.1  Hematocrit 36.0 - 46.0 % 34.3(L) 35.7(L) 34.8(L)  Platelets 150 - 400 K/uL 128(L) 110(L) 113(L)    CBG: No results for input(s): GLUCAP in the last 168 hours.  Brief HPI:   Isabel Hernandez is a 77 year old female with history of HTN, CAS, A fib, lumbar stenosis with radiculopathy who was admitted to [facility] on 04/17/18 with numbness and jerking movements of RUE and RLE. MRI/MRA brain done revealing Left posteromedial frontal lobe infarct with precentral gyrus and increased diffusion left posterior frontal white matter--question subacute/acute infarct v/s acute inflammatory or demyelinating lesion. 4-6 weeks follow up MRI recommended. She was placed on Eliquis for embolic stroke and Keppra to help manage spasms/jerky movements. Baclofen added due to ongoing spasms but patient continued to have significant deficits in mobility as well  ability to carry out ADLs. CIR recommended due to functional deficits.    Hospital Course: Isabel Hernandez was admitted to rehab 04/19/2018 for inpatient therapies to consist of PT, ST and OT at least three hours five days a week. Past admission physiatrist, therapy team and rehab RN have worked together to provide customized collaborative inpatient rehab. She was maintained on Eliquis for stroke prevention and she is tolerating this without SE. CBC showed H/H is relatively stable and thrombocytopenia back to baseline. No signs of bleeding noted. Heart rate has been controlled on low dose metoprolol.    Blood pressures were monitored on bid basis and has been stable.  She was maintained on Keppra bid and Klonopin was scheduled on bid basis. Baclofen was discontinued as no signs of spasticity noted. Her tremors have greatly improved with increase in mobility and activity. Zantac was resumed to help manage GERD and protein supplement was added to help with low calorie malnutrition. She is continent of bowel and bladder. Mood has been stable and she has made steady progress. She has progressed to supervision level and will continue to receive follow up outpatient PT and OT at [facility] outpatient rehab.    Rehab course: During patient's stay in rehab weekly team conferences were held to monitor patient's progress, set goals and discuss barriers to discharge. At admission, patient required mod assist with basic self care tasks and with mobility. She  has had improvement in activity tolerance, balance, postural control  as well as ability to compensate for deficits. She has had improvement in functional use RUE  and RLE as well as improvement in awareness. She is able to completed ADL tasks at modified independent level. She is able to perform transfers with supervision and is ambulating 150'X 2 with distant supervision.Family education was completed regarding all aspects of care and mobility.     Disposition:   Home  Diet: Heart Healthy.  Special Instructions: 1.No driving or strenuous activity till cleared by MD. 2. Needs repeat MRI brain in weeks for follow up on diffusion abnormality left posterior frontal white matter.   3. Repeat CBC in 1-2 weeks to monitor platelets.   Discharge Instructions    Ambulatory referral to Neurology   Complete by:  As directed    An appointment is requested in approximately: 3-4 weeks for follow up post stroke   Ambulatory referral to Physical Medicine Rehab   Complete by:  As directed    1-2 weeks transitional care appt     Allergies as of 04/27/2018      Reactions   Nitroglycerin Other (See Comments)   Can only use the patch  Turned lobster red and felt faint with sublingual NTG   Penicillins Hives   Hives Has patient had a PCN reaction causing immediate rash, facial/tongue/throat swelling, SOB or lightheadedness with hypotension: YES Has patient had a PCN reaction causing severe rash involving mucus membranes or skin necrosis: NO Has patient had a PCN reaction that required hospitalization NO Has patient had a PCN reaction occurring within the last 10 years: No If all of the above answers are "NO", then may proceed with Cephalosporin use.   Biaxin [clarithromycin] Rash   Rash    Latex Rash   Lidocaine Swelling   Eye swelling Tolerates Tetracaine with epidural steroid injections (04/10/17)   Nickel Rash   Oxycodone Hives   Tetracyclines & Related Rash      Tizanidine Rash      Medication List    STOP taking these medications   fenofibrate 145 MG tablet Commonly known as:  TRICOR   Vitamin D (Ergocalciferol) 50000 units Caps capsule Commonly known as:  DRISDOL     TAKE these medications   acetaminophen 325 MG tablet Commonly known as:  TYLENOL Take 1-2 tablets (325-650 mg total) by mouth every 4 (four) hours as needed for mild pain.   apixaban 5 MG Tabs tablet Commonly known as:  ELIQUIS Take 1 tablet (5 mg total) by mouth 2 (two)  times daily.   atorvastatin 40 MG tablet Commonly known as:  LIPITOR Take 1 tablet (40 mg total) by mouth daily at 6 PM.   clonazePAM 0.25 MG disintegrating tablet--Rx # 60 pills Commonly known as:  KLONOPIN Take 1 tablet (0.25 mg total) by mouth 2 (two) times daily. Notes to patient:  For spasms.    famotidine 20 MG tablet Commonly known as:  PEPCID Take 1 tablet (20 mg total) by mouth 2 (two) times daily.   fluticasone 50 MCG/ACT nasal spray Commonly known as:  FLONASE Place 2 sprays into both nostrils daily.   levETIRAcetam 500 MG tablet Commonly known as:  KEPPRA Take 1 tablet (500 mg total) by mouth 2 (two) times daily.   metoprolol tartrate 25 MG tablet Commonly known as:  LOPRESSOR Take 0.5 tablets (12.5 mg total) by mouth 2 (two) times daily.   multivitamin with minerals Tabs tablet Take 1 tablet by mouth daily.   senna-docusate 8.6-50 MG tablet Commonly  known as:  Senokot-S Take 2 tablets by mouth at bedtime as needed for mild constipation.      Follow-up Information    Kirsteins, Luanna Salk, MD Follow up.   Specialty:  Physical Medicine and Rehabilitation Why:  Office will call you with follow up appointment Contact information: [place] [address] [phone]        Glendon Axe, MD. Call in 1 day(s).   Specialty:  Internal Medicine Why:  for post hospital follow up Contact information: [place] [address] [phone]        TatEustace Quail, DO Follow up.   Specialty:  Neurology Why:  Call next week if you have not received a call with follow up appointment Contact information: [place] [address] [phone]           Signed: Bary Leriche 04/29/2018, 10:02 PM

## 2018-04-27 NOTE — Progress Notes (Signed)
Physical Therapy Session Note  Patient Details  Name: Isabel Hernandez MRN: [id number] Date of Birth: [birth year]  Today's Date: 04/27/2018 PT Individual Time: 1615-1700 PT Individual Time Calculation (min): 45 min   Short Term Goals: Week 1:  PT Short Term Goal 1 (Week 1): Pt will ambulate 25' w/ LRAD w/ min assist PT Short Term Goal 2 (Week 1): Pt will participate in 30 min of OOB activity w/o increase in fatigue PT Short Term Goal 3 (Week 1): Pt will transfer bed<>chair w/ min assist PT Short Term Goal 4 (Week 1): Pt will maintain dynamic standing balance during functional tasks w/ min assist Week 2:     Skilled Therapeutic Interventions/Progress Updates:   Pt received sitting in WC and agreeable to PT. Gait training in various environments including unlevel cement side walk at hospital entrance 2 x 150 and through hospital gift shop x [phone]. Distant supervision assist from PT throughout all gait training. PT instructed pt in modified [place] exercises program level A with Hand out provided. [facility] and all needs met.        Therapy Documentation Precautions:  Precautions Precautions: Fall Restrictions Weight Bearing Restrictions: No General:   Vital Signs: Therapy Vitals Temp: 98 F (36.7 C) Temp Source: Oral Pulse Rate: 72 Resp: 19 BP: (!) 157/70 Patient Position (if appropriate): Sitting Oxygen Therapy SpO2: 97 % O2 Device: Room Air    Locomotion : Gait Ambulation: Yes Gait Distance (Feet): 150 Feet Assistive device: Rolling walker Gait Assistance Details: cuing to ambulate within base of RW Gait Gait: Yes Gait Pattern: (decreased weight shifting L<>R, decreased gait speed, decreased stride length & step length BLE) Stairs / Additional Locomotion Stairs: Yes Stairs Assistance: Supervision/Verbal cueing Stair Management Technique: Two rails Number of Stairs: 12 Height of Stairs: (6" + 3" ) Wheelchair  Mobility Wheelchair Mobility: No  Balance: Balance Balance Assessed: Yes Standardized Balance Assessment Standardized Balance Assessment: Berg Balance Test Berg Balance Test Sit to Stand: Able to stand without using hands and stabilize independently Standing Unsupported: Able to stand safely 2 minutes Sitting with Back Unsupported but Feet Supported on Floor or Stool: Able to sit safely and securely 2 minutes Stand to Sit: Sits safely with minimal use of hands Transfers: Able to transfer safely, minor use of hands Standing Unsupported with Eyes Closed: Able to stand 10 seconds safely Standing Ubsupported with Feet Together: Able to place feet together independently and stand 1 minute safely From Standing, Reach Forward with Outstretched Arm: Can reach forward >12 cm safely (5") From Standing Position, Pick up Object from Floor: Able to pick up shoe safely and easily From Standing Position, Turn to Look Behind Over each Shoulder: Looks behind one side only/other side shows less weight shift Turn 360 Degrees: Able to turn 360 degrees safely but slowly Standing Unsupported, Alternately Place Feet on Step/Stool: Able to complete >2 steps/needs minimal assist(completes 3 steps with min assist) Standing Unsupported, One Foot in Front: Able to take small step independently and hold 30 seconds Standing on One Leg: Tries to lift leg/unable to hold 3 seconds but remains standing independently Total Score: 44      See Function Navigator for Current Functional Status.   Therapy/Group: Individual Therapy  ALorie Phenix7/26/2019, 5:27 PM

## 2018-04-27 NOTE — Progress Notes (Signed)
Occupational Therapy Discharge Summary  Patient Details  Name: Isabel Hernandez MRN: [id number] Date of Birth: 20-Apr-1941  Today's Date: 04/27/2018 OT Individual Time: 2951-8841 OT Individual Time Calculation (min): 55 min   Session Note:  Pt completed functional mobility in the hallway to and from the gym with use of the RW for support with modified independence.  Pt with slower rate of speed and shorter than normal step length.  In the gym had pt work on [employer] coordination with use of the nine hole peg test.  She complete this in 26 seconds with the LUE and 23 seconds with the right.  Also worked on gross coordination in sitting with ball toss and catch.  Started with medium sized ball progressing to small ball.  She only exhibited difficulty with attempting to toss the smaller ball between her hands, exhibiting multiple drops with the RUE.  Completed BUE exercises as well with use of 3 lb dowel rod.  She completed 2 sets of 20 reps for shoulder flexion, elbow flexion, and overhead press.  Utilized orange medium resistance therapy band for 2 sets of 15 reps for shoulder row.  Practiced walk-in shower transfer with use of the RW and setup at home.  Supervision to complete transfer with use of simulated grab bars.  Finished session with return to the room and pt left sitting in wheelchair per her request.    Patient has met 11 of 11 long term goals due to improved balance, postural control, ability to compensate for deficits, functional use of  RIGHT upper and RIGHT lower extremity and improved coordination.  Patient to discharge at overall Modified Independent level.  Patient's care partner is independent to provide the necessary physical assistance at discharge.    Reasons goals not met: NA  Recommendation:  Patient will benefit from ongoing skilled OT services in outpatient setting to continue to advance functional skills in the area of BADL and Reduce care partner burden.  Recommend continued outpatient OT  to continue progression of ADL function back to an independent level.  Pt still demonstrates decreased dynamic balance and speed secondary to decreased coordination and strength in the LLE which impacts overall independence.  Slight decreased gross motor coordination and strength are also still present in the LUE, which needs to be improved on as well.   Equipment: shower seat  Reasons for discharge: treatment goals met and discharge from hospital  Patient/family agrees with progress made and goals achieved: Yes  OT Discharge Precautions/Restrictions  Precautions Precautions: Fall Restrictions Weight Bearing Restrictions: No Pain Pain Assessment Pain Score: 0-No pain ADL  See Function Section of chart for details Vision Baseline Vision/History: Wears glasses Wears Glasses: At all times Patient Visual Report: No change from baseline Vision Assessment?: (Pt reports some intermittent diplopia at times when reading, but not present this session.) Eye Alignment: Within Functional Limits Ocular Range of Motion: Within Functional Limits Alignment/Gaze Preference: Within Defined Limits Tracking/Visual Pursuits: Decreased smoothness of horizontal tracking(slight jerkiness with left to right tracking but able to follow target consistently.) Visual Fields: No apparent deficits Perception  Perception: Within Functional Limits Praxis Praxis: Intact Cognition Overall Cognitive Status: Within Functional Limits for tasks assessed Arousal/Alertness: Awake/alert Orientation Level: Oriented X4 Memory: Appears intact Awareness: Appears intact Problem Solving: Appears intact Safety/Judgment: Appears intact Sensation Sensation Light Touch: Appears Intact Proprioception: Appears Intact Stereognosis: Appears Intact Coordination Gross Motor Movements are Fluid and Coordinated: No Fine Motor Movements are Fluid and Coordinated: Yes Coordination and Movement Description: Slight dysmetria in  the LUE compared to the right but nine hole peg test completed in 23 seconds on the left compared to 26 seconds on the right.   Motor  Motor Motor: Hemiplegia Motor - Discharge Observations: Mild left hemiparesis in the RUE and RLE, more impaired in the LE with functional mobility. Mobility  Transfers Sit to Stand: Independent with assistive device Stand to Sit: Independent with assistive device  Trunk/Postural Assessment  Cervical Assessment Cervical Assessment: Exceptions to WFL(sligh forward head) Thoracic Assessment Thoracic Assessment: Within Functional Limits Lumbar Assessment Lumbar Assessment: Within Functional Limits  Balance Balance Balance Assessed: Yes Static Sitting Balance Static Sitting - Balance Support: No upper extremity supported;Feet supported Static Sitting - Level of Assistance: 7: Independent Dynamic Sitting Balance Dynamic Sitting - Balance Support: During functional activity Dynamic Sitting - Level of Assistance: 6: Modified independent (Device/Increase time) Static Standing Balance Static Standing - Balance Support: During functional activity Static Standing - Level of Assistance: 6: Modified independent (Device/Increase time) Dynamic Standing Balance Dynamic Standing - Balance Support: Bilateral upper extremity supported;During functional activity Dynamic Standing - Level of Assistance: 6: Modified independent (Device/Increase time) Extremity/Trunk Assessment RUE Assessment RUE Assessment: Exceptions to [facility] Active Range of Motion (AROM) Comments: WFL General Strength Comments: shoulder strength 3+/5, elbow flexion and extension 4/5, grip 3+/5 RUE Body System: Neuro RUE Strength RUE Overall Strength: Deficits LUE Assessment LUE Assessment: Exceptions to [facility] Active Range of Motion (AROM) Comments: WFLs General Strength Comments: strength 3+/5 in the shoulders and 4/5 in all other areas   See Function Navigator for Current Functional  Status.  Sheril Hammond OTR/L 04/27/2018, 10:33 AM

## 2018-04-27 NOTE — Progress Notes (Signed)
Subjective/Complaints: No issues overnite    ROS- no CP, SOB, N/V/D Objective: Vital Signs: Blood pressure 136/77, pulse 89, temperature 97.8 F (36.6 C), temperature source Oral, resp. rate 14, height _0  (1.626 m), weight 74 kg (163 lb 2.3 oz), SpO2 93 %. No results found. Results for orders placed or performed during the hospital encounter of 04/19/18 (from the past 72 hour(s))  CBC     Status: Abnormal   Collection Time: 04/26/18  4:52 AM  Result Value Ref Range   WBC 4.3 4.0 - 10.5 K/uL   RBC 3.32 (L) 3.87 - 5.11 MIL/uL   Hemoglobin 11.4 (L) 12.0 - 15.0 g/dL   HCT 34.3 (L) 36.0 - 46.0 %   MCV 103.3 (H) 78.0 - 100.0 fL   MCH 34.3 (H) 26.0 - 34.0 pg   MCHC 33.2 30.0 - 36.0 g/dL   RDW 13.2 11.5 - 15.5 %   Platelets 128 (L) 150 - 400 K/uL    Comment: Performed at [facility], [place] [address], [place], [place] [address]  Basic metabolic panel     Status: None   Collection Time: 04/26/18  4:52 AM  Result Value Ref Range   Sodium 142 135 - 145 mmol/L   Potassium 3.9 3.5 - 5.1 mmol/L   Chloride 108 98 - 111 mmol/L   CO2 28 22 - 32 mmol/L   Glucose, Bld 98 70 - 99 mg/dL   BUN 20 8 - 23 mg/dL   Creatinine, Ser 0.73 0.44 - 1.00 mg/dL   Calcium 9.4 8.9 - 10.3 mg/dL   GFR calc non Af Amer >60 >60 mL/min   GFR calc Af Amer >60 >60 mL/min    Comment: (NOTE) The eGFR has been calculated using the CKD EPI equation. This calculation has not been validated in all clinical situations. eGFR's persistently <60 mL/min signify possible Chronic Kidney Disease.    Anion gap 6 5 - 15    Comment: Performed at [facility] [address] [place], [place] [address]     HEENT: normal Cardio: RRR and no murmur Resp: CTA B/L and unlabored GI: reduced BS, NT, ND Extremity:  No Edema Skin:   Other no incisional drainage Neuro: Alert/Oriented, Normal Sensory and Abnormal Motor 5/5 in BUE, 4/5 in BLE Musc/Skel:  Other no pain with UE or LE ROM GEN NAD   Assessment/Plan: 1.  Functional deficits secondary to RIght frontal infarct which require 3+ hours per day of interdisciplinary therapy in a comprehensive inpatient rehab setting. Physiatrist is providing close team supervision and 24 hour management of active medical problems listed below. Physiatrist and rehab team continue to assess barriers to discharge/monitor patient progress toward functional and medical goals. FIM: Function - Bathing Bathing activity did not occur: Refused Position: Wheelchair/chair at sink(per pt report) Body parts bathed by patient: Right arm, Right lower leg, Left arm, Left lower leg, Chest, Abdomen, Front perineal area, Buttocks, Right upper leg, Left upper leg, Back Body parts bathed by helper: Back Assist Level: Set up  Function- Upper Body Dressing/Undressing What is the patient wearing?: Pull over shirt/dress, Bra Bra - Perfomed by patient: Thread/unthread right bra strap, Thread/unthread left bra strap, Hook/unhook bra (pull down sports bra) Pull over shirt/dress - Perfomed by patient: Thread/unthread right sleeve, Thread/unthread left sleeve, Put head through opening, Pull shirt over trunk Assist Level: No help, No cues Set up : To obtain clothing/put away Function - Lower Body Dressing/Undressing What is the patient wearing?: Underwear, Pants,  Shoes Position: Other (comment)(sitting on toilet) Underwear - Performed by patient: Thread/unthread right underwear leg, Thread/unthread left underwear leg, Pull underwear up/down Pants- Performed by patient: Thread/unthread right pants leg, Thread/unthread left pants leg, Pull pants up/down Pants- Performed by helper: Thread/unthread right pants leg, Thread/unthread left pants leg Non-skid slipper socks- Performed by patient: Don/doff right sock, Don/doff left sock Shoes - Performed by patient: Don/doff right shoe, Don/doff left shoe Shoes - Performed by helper: Fasten right Assist for footwear: Supervision/touching assist Assist for  lower body dressing: No Help, No cues(per pt report)  Function - Toileting Toileting steps completed by patient: Adjust clothing prior to toileting, Performs perineal hygiene, Adjust clothing after toileting Toileting steps completed by helper: Adjust clothing prior to toileting Toileting Assistive Devices: Grab bar or rail Assist level: More than reasonable time  Function - Toilet Transfers Toilet transfer assistive device: Grab bar, Elevated toilet seat/BSC over toilet, Walker Assist level to toilet: No Help, no cues, assistive device, takes more than a reasonable amount of time Assist level from toilet: No Help, no cues, assistive device, takes more than a reasonable amount of time Assist level to bedside commode (at bedside): Touching or steadying assistance (Pt > 75%) Assist level from bedside commode (at bedside): Touching or steadying assistance (Pt > 75%)  Function - Chair/bed transfer Chair/bed transfer method: Stand pivot, Ambulatory Chair/bed transfer assist level: No Help, no cues, assistive device, takes more than a reasonable amount of time Chair/bed transfer assistive device: Armrests, Walker Chair/bed transfer details: Verbal cues for technique, Verbal cues for precautions/safety  Function - Locomotion: Wheelchair Will patient use wheelchair at discharge?: No Type: Manual Max wheelchair distance: 150' Assist Level: Supervision or verbal cues Assist Level: Supervision or verbal cues Assist Level: Supervision or verbal cues Turns around,maneuvers to table,bed, and toilet,negotiates 3% grade,maneuvers on rugs and over doorsills: No Function - Locomotion: Ambulation Assistive device: Walker-rolling Max distance: 150' Assist level: Supervision or verbal cues Assist level: Supervision or verbal cues Walk 50 feet with 2 turns activity did not occur: Safety/medical concerns Assist level: Supervision or verbal cues Walk 150 feet activity did not occur: Safety/medical  concerns Assist level: Supervision or verbal cues Walk 10 feet on uneven surfaces activity did not occur: Safety/medical concerns Assist level: Supervision or verbal cues  Function - Comprehension Comprehension: Auditory Comprehension assist level: Understands complex 90% of the time/cues 10% of the time  Function - Expression Expression: Verbal Expression assist level: Expresses complex 90% of the time/cues < 10% of the time  Function - Social Interaction Social Interaction assist level: Interacts appropriately with others with medication or extra time (anti-anxiety, antidepressant).  Function - Problem Solving Problem solving assist level: Solves complex 90% of the time/cues < 10% of the time  Function - Memory Memory assist level: Recognizes or recalls 90% of the time/requires cueing < 10% of the time Patient normally able to recall (first 3 days only): Current season, That he or she is in a hospital, Location of own room, Staff names and faces  Medical Problem List and Plan: 1. 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 DVT Prophylaxis/Anticoagulation: Pharmaceutical:Other (comment)--Eliquis 3. Pain Management:tylenol prn. 4. Mood:LCSW to follow for evaluation and support. 5. Neuropsych: This patient RIght frontal infarct capable of making decisions on her own behalf.complains of severe family related stressor  6. Skin/Wound Care:Routine pressure relief measures. 7. Fluids/Electrolytes/Nutrition:Monitor I/O- 8. CAF: Monitor HR bid. Controlled off medications. On Eliquis Vitals:   04/26/18 1500  04/26/18 2001  BP: 130/88 136/77  Pulse: 75 89  Resp: 15 14  Temp: 98.6 F (37 C) 97.8 F (36.6 C)  SpO2: 96% 93%  controlled HR on Lopressor 7/25, BP within range as well  9. Spinal stenosis with radiculopathy RLE: Tylenol prn  10. HTN: Monitor BP bid--allow for adequate perfusion. Off metoprolol,  amlodipine and losartan at this time.  11. Seizure prophylaxis: On Keppra bid. No documented seizures   13.GERD/H/o dysphagia/Schatzik's ring s/p dilatation/esophageal spasms:Resume home Zantac. 12. ?Spasticity:no physical sign will d/c baclofen. Tremor may be due to anxiety Schedule klonopin .60m BID 13 Hypoalb add prostat  LOS (Days) 8 A FACE TO FACE EVALUATION WAS PERFORMED  ACharlett Blake7/26/2019, 10:47 AM

## 2018-04-27 NOTE — Progress Notes (Signed)
Physical Therapy Discharge Summary  Patient Details  Name: Isabel Hernandez MRN: [id number] Date of Birth: 03/17/1941   Patient has met 6 of 6 long term goals due to improved activity tolerance, improved balance, improved postural control, increased strength, improved awareness and improved coordination.  Patient to discharge at an ambulatory level Supervision.   Patient's care partner is independent to provide the necessary supervision assistance at discharge.  All goals met.   Recommendation:  Patient will benefit from ongoing skilled PT services in outpatient setting to continue to advance safe functional mobility, address ongoing impairments in balance, strength, functional mobility, and minimize fall risk.  Equipment: RW  Reasons for discharge: treatment goals met  Patient/family agrees with progress made and goals achieved: Yes  PT Discharge Precautions/Restrictions Precautions Precautions: Fall Restrictions Weight Bearing Restrictions: No Pain Pain Assessment Pain Scale: 0-10 Pain Score: 0-No pain Vision/Perception  Vision - Assessment Eye Alignment: Within Functional Limits Ocular Range of Motion: Within Functional Limits Alignment/Gaze Preference: Within Defined Limits Tracking/Visual Pursuits: Decreased smoothness of horizontal tracking(slight jerkiness with left to right tracking but able to follow target consistently.) Perception Perception: Within Functional Limits Praxis Praxis: Intact  Cognition Overall Cognitive Status: Within Functional Limits for tasks assessed Arousal/Alertness: Awake/alert Orientation Level: Oriented X4 Memory: Appears intact Awareness: Appears intact Problem Solving: Appears intact Safety/Judgment: Appears intact Sensation Sensation Light Touch: Appears Intact Light Touch Impaired Details: Impaired RUE(occasional tingling in R fingers) Proprioception: Appears Intact Stereognosis: Appears Intact Coordination Gross Motor Movements  are Fluid and Coordinated: No Fine Motor Movements are Fluid and Coordinated: Yes Coordination and Movement Description: Slight dysmetria in the LUE compared to the right but nine hole peg test completed in 23 seconds on the left compared to 26 seconds on the right.   Heel Shin Test: LLE WFL, RLE impaired Motor  Motor Motor: Hemiplegia Motor - Discharge Observations: Mild left hemiparesis in the RUE and RLE, more impaired in the LE with functional mobility.  Mobility Transfers Transfers: Stand to Sit;Sit to Stand;Stand Pivot Transfers Sit to Stand: Independent with assistive device Stand to Sit: Independent with assistive device Stand Pivot Transfers: Independent with assistive device Transfer (Assistive device): Rolling walker Trunk/Postural Assessment  Cervical Assessment Cervical Assessment: Exceptions to WFL(forward head) Thoracic Assessment Thoracic Assessment: Within Functional Limits Lumbar Assessment Lumbar Assessment: Within Functional Limits Postural Control Postural Control: Within Functional Limits  Balance Balance Balance Assessed: Yes Standardized Balance Assessment Standardized Balance Assessment: Berg Balance Test Berg Balance Test Sit to Stand: Able to stand without using hands and stabilize independently Standing Unsupported: Able to stand safely 2 minutes Sitting with Back Unsupported but Feet Supported on Floor or Stool: Able to sit safely and securely 2 minutes Stand to Sit: Sits safely with minimal use of hands Transfers: Able to transfer safely, minor use of hands Standing Unsupported with Eyes Closed: Able to stand 10 seconds safely Standing Ubsupported with Feet Together: Able to place feet together independently and stand 1 minute safely From Standing, Reach Forward with Outstretched Arm: Can reach forward >12 cm safely (5") From Standing Position, Pick up Object from Floor: Able to pick up shoe safely and easily From Standing Position, Turn to Look  Behind Over each Shoulder: Looks behind one side only/other side shows less weight shift Turn 360 Degrees: Able to turn 360 degrees safely but slowly Standing Unsupported, Alternately Place Feet on Step/Stool: Able to complete >2 steps/needs minimal assist(completes 3 steps with min assist) Standing Unsupported, One Foot in Front: Able to take small step independently and  hold 30 seconds Standing on One Leg: Tries to lift leg/unable to hold 3 seconds but remains standing independently Total Score: 44 Extremity Assessment  RUE Assessment RUE Assessment: Exceptions to [facility] Active Range of Motion (AROM) Comments: [facility] General Strength Comments: shoulder strength 3+/5, elbow flexion and extension 4/5, grip 3+/5 RUE Body System: Neuro RUE Strength RUE Overall Strength: Deficits LUE Assessment LUE Assessment: Exceptions to [facility] Active Range of Motion (AROM) Comments: WFLs General Strength Comments: strength 3+/5 in the shoulders and 4/5 in all other areas RLE Assessment RLE Assessment: Exceptions to [facility] Passive Range of Motion (PROM) Comments: [facility] General Strength Comments: 3+ to 4/5 throughout  LLE Assessment LLE Assessment: Within Functional Limits   Locomotion:  Ambulates 150 ft with RW with mod I in controlled hospital environment. Decreased gait speed, decreased stride length, decreased weight shifting, decreased step length BLE. Negotiates 12 steps (6" + 3") with B rails and supervision.    See Function Navigator for Current Functional Status.  Netta Corrigan, PT, DPT 04/27/2018, 11:24 AM   Lavone Nian, PT, DPT 04/27/2018, 2:59 PM

## 2018-04-27 NOTE — Plan of Care (Signed)
  Problem: Consults Goal: RH STROKE PATIENT EDUCATION Description See Patient Education module for education specifics  Outcome: Progressing  Continue education

## 2018-04-28 DIAGNOSIS — D696 Thrombocytopenia, unspecified: Secondary | ICD-10-CM

## 2018-04-28 DIAGNOSIS — D62 Acute posthemorrhagic anemia: Secondary | ICD-10-CM

## 2018-04-28 DIAGNOSIS — I480 Paroxysmal atrial fibrillation: Secondary | ICD-10-CM

## 2018-04-28 NOTE — Progress Notes (Signed)
PRN tylenol given at 0015, for C/O HA. +/- sleep during night. Isabel Hernandez

## 2018-04-28 NOTE — Progress Notes (Signed)
Pt d/c 0806 with personal belongings as well as equipment.  Pt reports that already has discharge paperwork and was provided d/c instructions yesterday.  Pt very anxious to leave.  Ride arrived and pt was escorted by PCA.

## 2018-04-28 NOTE — Progress Notes (Signed)
Called to room, patient very anxious and crying. Reports husband has fallen and has been taken to [facility] and is being transferred to [facility]. Upset that dog ran off. Requesting to be discharged ASAP. Paged Dr. Posey Pronto R/T above, requesting patient wait until AM. Patient very up, but agreeable. Friends will be up to assist with discharge at 0800 in AM. Isabel Hernandez

## 2018-04-28 NOTE — Progress Notes (Signed)
Subjective/Complaints: Patient seen sitting up in her chair this morning, and she is to be discharged overnight had conversation with nursing and patient regarding discharge. Patient wanted to be discharged at 10:30 PM because she was informed her dog was lost. Patient's husband also hospitalized and transferred to [facility] after a fall.  ROS- denies CP, SOB, N/V/D  Objective: Vital Signs: Blood pressure (!) 151/64, pulse 86, temperature 97.7 F (36.5 C), temperature source Oral, resp. rate 18, height 5' 4"  (1.626 m), weight 74 kg (163 lb 2.3 oz), SpO2 95 %. No results found. Results for orders placed or performed during the hospital encounter of 04/19/18 (from the past 72 hour(s))  CBC     Status: Abnormal   Collection Time: 04/26/18  4:52 AM  Result Value Ref Range   WBC 4.3 4.0 - 10.5 K/uL   RBC 3.32 (L) 3.87 - 5.11 MIL/uL   Hemoglobin 11.4 (L) 12.0 - 15.0 g/dL   HCT 34.3 (L) 36.0 - 46.0 %   MCV 103.3 (H) 78.0 - 100.0 fL   MCH 34.3 (H) 26.0 - 34.0 pg   MCHC 33.2 30.0 - 36.0 g/dL   RDW 13.2 11.5 - 15.5 %   Platelets 128 (L) 150 - 400 K/uL    Comment: Performed at [facility], [place] [address] [place], [place] [address]  Basic metabolic panel     Status: None   Collection Time: 04/26/18  4:52 AM  Result Value Ref Range   Sodium 142 135 - 145 mmol/L   Potassium 3.9 3.5 - 5.1 mmol/L   Chloride 108 98 - 111 mmol/L   CO2 28 22 - 32 mmol/L   Glucose, Bld 98 70 - 99 mg/dL   BUN 20 8 - 23 mg/dL   Creatinine, Ser 0.73 0.44 - 1.00 mg/dL   Calcium 9.4 8.9 - 10.3 mg/dL   GFR calc non Af Amer >60 >60 mL/min   GFR calc Af Amer >60 >60 mL/min    Comment: (NOTE) The eGFR has been calculated using the CKD EPI equation. This calculation has not been validated in all clinical situations. eGFR's persistently <60 mL/min signify possible Chronic Kidney Disease.    Anion gap 6 5 - 15    Comment: Performed at [facility] [address] [place], [place] [address]      Constitutional: No distress . Vital signs reviewed. HENT: Normocephalic.  Atraumatic. Eyes: EOMI. No discharge. Cardiovascular: RRR. No JVD. Respiratory: CTA Bilaterally. Normal effort. GI: BS +. Non-distended. Musc: No edema or tenderness in extremities. Skin:   Arm and dry. Intact. Neuro: Alert/Oriented,  Motor Bilateral upper extremities: 5/5 proximal to distal Bilateral lower extremities: 4+/5 proximal distal Musc/Skel:  No edema or tenderness in extremities. GEN NAD. Vital signs reviewed.   Assessment/Plan: 1. Functional deficits secondary to RIght frontal infarct which require 3+ hours per day of interdisciplinary therapy in a comprehensive inpatient rehab setting. Physiatrist is providing close team supervision and 24 hour management of active medical problems listed below. Physiatrist and rehab team continue to assess barriers to discharge/monitor patient progress toward functional and medical goals. FIM: Function - Bathing Bathing activity did not occur: Refused Position: Wheelchair/chair at sink(per pt report) Body parts bathed by patient: Right arm, Right lower leg, Left arm, Left lower leg, Chest, Abdomen, Front perineal area, Buttocks, Right upper leg, Left upper leg, Back Body parts bathed by helper: Back Assist Level: Set up  Function- Upper Body Dressing/Undressing What is the patient wearing?: Pull over  shirt/dress, Bra Bra - Perfomed by patient: Thread/unthread right bra strap, Thread/unthread left bra strap, Hook/unhook bra (pull down sports bra) Pull over shirt/dress - Perfomed by patient: Thread/unthread right sleeve, Thread/unthread left sleeve, Put head through opening, Pull shirt over trunk Assist Level: No help, No cues Set up : To obtain clothing/put away Function - Lower Body Dressing/Undressing What is the patient wearing?: Underwear, Pants, Shoes Position: Other (comment)(sitting on toilet) Underwear - Performed by patient: Thread/unthread right  underwear leg, Thread/unthread left underwear leg, Pull underwear up/down Pants- Performed by patient: Thread/unthread right pants leg, Thread/unthread left pants leg, Pull pants up/down Pants- Performed by helper: Thread/unthread right pants leg, Thread/unthread left pants leg Non-skid slipper socks- Performed by patient: Don/doff right sock, Don/doff left sock Shoes - Performed by patient: Don/doff right shoe, Don/doff left shoe Shoes - Performed by helper: Fasten right Assist for footwear: Supervision/touching assist Assist for lower body dressing: No Help, No cues(per pt report)  Function - Toileting Toileting steps completed by patient: Adjust clothing prior to toileting, Performs perineal hygiene, Adjust clothing after toileting Toileting steps completed by helper: Adjust clothing prior to toileting Toileting Assistive Devices: Grab bar or rail Assist level: More than reasonable time  Function - Air cabin crew transfer assistive device: Grab bar, Elevated toilet seat/BSC over toilet, Walker Assist level to toilet: No Help, no cues, assistive device, takes more than a reasonable amount of time Assist level from toilet: No Help, no cues, assistive device, takes more than a reasonable amount of time Assist level to bedside commode (at bedside): Touching or steadying assistance (Pt > 75%) Assist level from bedside commode (at bedside): Touching or steadying assistance (Pt > 75%)  Function - Chair/bed transfer Chair/bed transfer method: Stand pivot, Ambulatory Chair/bed transfer assist level: No Help, no cues, assistive device, takes more than a reasonable amount of time Chair/bed transfer assistive device: Armrests, Walker Chair/bed transfer details: Verbal cues for technique, Verbal cues for precautions/safety  Function - Locomotion: Wheelchair Will patient use wheelchair at discharge?: No Type: Manual Max wheelchair distance: 150' Assist Level: Supervision or verbal  cues Assist Level: Supervision or verbal cues Assist Level: Supervision or verbal cues Turns around,maneuvers to table,bed, and toilet,negotiates 3% grade,maneuvers on rugs and over doorsills: No Function - Locomotion: Ambulation Assistive device: Walker-rolling Max distance: 150 ft  Assist level: Supervision or verbal cues Assist level: Supervision or verbal cues Walk 50 feet with 2 turns activity did not occur: Safety/medical concerns Assist level: Supervision or verbal cues Walk 150 feet activity did not occur: Safety/medical concerns Assist level: Supervision or verbal cues Walk 10 feet on uneven surfaces activity did not occur: Safety/medical concerns Assist level: Supervision or verbal cues  Function - Comprehension Comprehension: Auditory Comprehension assist level: Understands complex 90% of the time/cues 10% of the time  Function - Expression Expression: Verbal Expression assist level: Expresses complex 90% of the time/cues < 10% of the time  Function - Social Interaction Social Interaction assist level: Interacts appropriately with others with medication or extra time (anti-anxiety, antidepressant).  Function - Problem Solving Problem solving assist level: Solves complex 90% of the time/cues < 10% of the time  Function - Memory Memory assist level: Recognizes or recalls 90% of the time/requires cueing < 10% of the time Patient normally able to recall (first 3 days only): Current season, That he or she is in a hospital, Location of own room, Staff names and faces  Medical Problem List and Plan: 1. Left hemiparesis secondary to Right frontal infarct  cardioembolic stroke continue Eliquis  DC today  Patient follow-up with M.D. For transiti care management in 1-2 wee  Notes reviewed-stroke OSA, transferred to [facility], images reviewed-left frontal infarct, labs reviewed  2. DVT Prophylaxis/Anticoagulation: Pharmaceutical:Other (comment)--Eliquis 3. Pain  Management:tylenol prn. 4. Mood:LCSW to follow for evaluation and support. 5. Neuropsych: This patient is capable of making decisions on her own behalf.  6. Skin/Wound Care:Routine pressure relief measures. 7. Fluids/Electrolytes/Nutrition:Monitor I/O-  8. PAF: Monitor HR bid. Controlled off medications. On Eliquis Vitals:   04/27/18 2106 04/28/18 0446  BP: (!) 142/73 (!) 151/64  Pulse: 71 86  Resp: 17 18  Temp: 97.6 F (36.4 C) 97.7 F (36.5 C)  SpO2: 94% 95%   9. Spinal stenosis with radiculopathy RLE: Tylenol prn  10. HTN: Monitor BP bid--allow for adequate perfusion. Off metoprolol, amlodipine and losartan at this time.   Elevated in the last 12 hours, otherwise relatively controlled 11. Seizure prophylaxis: On Keppra bid. No documented seizures    13.GERD/H/o dysphagia/Schatzik's ring s/p dilatation/esophageal spasms:Resume home Zantac. 12. ?Spasticity:no physical sign d/ced baclofen. Tremor may be due to anxiety Scheduled klonopin .67m BID 13 Hypoalb add prostat 14. Acute blood loss anemia  Hemoglobin 11.4 on 7/25 15. thrombocytopenia  Platelets 128 on 7/25  Continue to monitor  Greater than 30 minutes spent with patient and counseling and coordination of care regarding appropriate discharge havingand preventing patient from beginning a [place] Encouraged patient to monitor personal health, especially given proximity to recent stroke  LOS (Days) 9 A FACE TO FACE EVALUATION WAS PERFORMED  Izaiah Tabb ALorie Phenix7/27/2019, 1:14 PM

## 2018-04-30 ENCOUNTER — Ambulatory Visit: Payer: Medicare Other | Admitting: Occupational Therapy

## 2018-05-01 ENCOUNTER — Telehealth: Payer: Self-pay | Admitting: Registered Nurse

## 2018-05-01 ENCOUNTER — Ambulatory Visit: Payer: Medicare Other | Admitting: Physical Therapy

## 2018-05-01 MED ORDER — BACLOFEN 5 MG PO TABS: 5.0000 mg | ORAL_TABLET | Freq: Two times a day (BID) | ORAL | 1 refills | Status: DC | PRN

## 2018-05-01 NOTE — Telephone Encounter (Signed)
Transitional Care call  Patient name: Isabel Hernandez  DOB: Feb 22, 1941 1. Are you/is patient experiencing any problems since coming home? Yes, she states she has been having muscle spasms in her right leg and right foot. During her hospital staty she was prescribed baclofen but was discontinued. We will prescribe baclofen 5 mg BID as needed. She verbalizes understanding.  a. Are there any questions regarding any aspect of care? See the above. 2. Are there any questions regarding medications administration/dosing? No  a. Are meds being taken as prescribed? Yes b. "Patient should review meds with caller to confirm"  3. Have there been any falls? Yes,  She reports she was heading to the bathroom and began to urinate prir to reaching the commode, she slipped on the urine that was on the commode, she landed on her right shoulder. She was able to pick herself up and didn't seek medical attention.  4. Has Home Health been to the house and/or have they contacted you? No, she was schedule to receive outpatient therapy at [facility]. She states she is unable to drive at this time and her husband was just released from [facility] I will send an email to [name] for assistance, she verbalizes understanding. [name] asking for Home Therapy.  a. If not, have you tried to contact them? NA, see the above.  b. Can we help you contact them? NA 5. Are bowels and bladder emptying properly? Yes a. Are there any unexpected incontinence issues? X1, resolved.  b. If applicable, is patient following bowel/bladder programs? NA 6. Any fevers, problems with breathing, unexpected pain? No 7. Are there any skin problems or new areas of breakdown? No 8. Has the patient/family member arranged specialty MD follow up (ie cardiology/neurology/renal/surgical/etc.)?  Yes. a. Can we help arrange? NA 9. Does the patient need any other services or support that we can help arrange? NA 10. Are caregivers following through as  expected in assisting the patient? Yes. 11. Has the patient quit smoking, drinking alcohol, or using drugs as recommended? [name] states she doesn't smoke, drink alcohol or use illicit drugs.   Appointment date/time 05/07/2018, arrival time 10:00 for 10:20 appointment with Danella Sensing ANP. At [place]

## 2018-05-02 ENCOUNTER — Telehealth (HOSPITAL_COMMUNITY): Payer: Self-pay

## 2018-05-02 DIAGNOSIS — E782 Mixed hyperlipidemia: Secondary | ICD-10-CM | POA: Diagnosis not present

## 2018-05-02 DIAGNOSIS — I10 Essential (primary) hypertension: Secondary | ICD-10-CM | POA: Diagnosis not present

## 2018-05-02 NOTE — Progress Notes (Signed)
Per request of pt, have cancelled her OPPT and OT that had been arranged with [facility] and have referred for HHPT, OT via [place].   Brantlee Penn, LCSW

## 2018-05-02 NOTE — Telephone Encounter (Signed)
Placed a call to Ms. Finch, she is aware of the change in therapy to [place].

## 2018-05-03 ENCOUNTER — Telehealth: Payer: Self-pay

## 2018-05-03 ENCOUNTER — Telehealth: Payer: Self-pay | Admitting: Cardiovascular Disease

## 2018-05-03 NOTE — Telephone Encounter (Signed)
Copied from [place] [phone]. Topic: Quick Communication - See Telephone Encounter >> May 03, 2018  8:30 AM Marja Kays F wrote: Pt is calling to see when she had her last pneumonia shot or the booster   Best number is [phone]

## 2018-05-03 NOTE — Telephone Encounter (Signed)
Dr. Fletcher Anon,   Would you prefer to see her or do you think she needs to see Dr. Caryl Comes?  Thanks!

## 2018-05-03 NOTE — Telephone Encounter (Signed)
Patient calling States that she was in the hospital for about 10 days after a stroke and a fib Patient is curious about scheduling with Dr. Caryl Comes but would need confirmation if his speciality would be beneficial to her - she does not have a device  Patient has an appointment on 9/24 with Dr. Fletcher Anon but would like to be seen much sooner Please call to discuss

## 2018-05-05 DIAGNOSIS — I69351 Hemiplegia and hemiparesis following cerebral infarction affecting right dominant side: Secondary | ICD-10-CM | POA: Diagnosis not present

## 2018-05-05 DIAGNOSIS — G9389 Other specified disorders of brain: Secondary | ICD-10-CM | POA: Diagnosis not present

## 2018-05-05 DIAGNOSIS — Z87891 Personal history of nicotine dependence: Secondary | ICD-10-CM | POA: Diagnosis not present

## 2018-05-05 DIAGNOSIS — I4891 Unspecified atrial fibrillation: Secondary | ICD-10-CM | POA: Diagnosis not present

## 2018-05-05 DIAGNOSIS — I69393 Ataxia following cerebral infarction: Secondary | ICD-10-CM | POA: Diagnosis not present

## 2018-05-05 DIAGNOSIS — I10 Essential (primary) hypertension: Secondary | ICD-10-CM | POA: Diagnosis not present

## 2018-05-05 DIAGNOSIS — D696 Thrombocytopenia, unspecified: Secondary | ICD-10-CM | POA: Diagnosis not present

## 2018-05-05 DIAGNOSIS — K219 Gastro-esophageal reflux disease without esophagitis: Secondary | ICD-10-CM | POA: Diagnosis not present

## 2018-05-05 DIAGNOSIS — M48062 Spinal stenosis, lumbar region with neurogenic claudication: Secondary | ICD-10-CM | POA: Diagnosis not present

## 2018-05-05 DIAGNOSIS — Z7901 Long term (current) use of anticoagulants: Secondary | ICD-10-CM | POA: Diagnosis not present

## 2018-05-05 DIAGNOSIS — G252 Other specified forms of tremor: Secondary | ICD-10-CM | POA: Diagnosis not present

## 2018-05-05 DIAGNOSIS — Z79899 Other long term (current) drug therapy: Secondary | ICD-10-CM | POA: Diagnosis not present

## 2018-05-07 ENCOUNTER — Encounter: Payer: Medicare Other | Attending: Registered Nurse | Admitting: Registered Nurse

## 2018-05-07 ENCOUNTER — Ambulatory Visit: Payer: Medicare Other

## 2018-05-07 ENCOUNTER — Encounter: Payer: Self-pay | Admitting: Registered Nurse

## 2018-05-07 ENCOUNTER — Ambulatory Visit: Payer: Medicare Other | Admitting: Occupational Therapy

## 2018-05-07 VITALS — BP 115/79 | HR 70 | Resp 14 | Ht 64.0 in | Wt 155.0 lb

## 2018-05-07 DIAGNOSIS — E785 Hyperlipidemia, unspecified: Secondary | ICD-10-CM | POA: Diagnosis not present

## 2018-05-07 DIAGNOSIS — K589 Irritable bowel syndrome without diarrhea: Secondary | ICD-10-CM | POA: Insufficient documentation

## 2018-05-07 DIAGNOSIS — I482 Chronic atrial fibrillation, unspecified: Secondary | ICD-10-CM

## 2018-05-07 DIAGNOSIS — Z87891 Personal history of nicotine dependence: Secondary | ICD-10-CM | POA: Diagnosis not present

## 2018-05-07 DIAGNOSIS — I69351 Hemiplegia and hemiparesis following cerebral infarction affecting right dominant side: Secondary | ICD-10-CM | POA: Insufficient documentation

## 2018-05-07 DIAGNOSIS — I639 Cerebral infarction, unspecified: Secondary | ICD-10-CM | POA: Diagnosis not present

## 2018-05-07 DIAGNOSIS — Z853 Personal history of malignant neoplasm of breast: Secondary | ICD-10-CM | POA: Insufficient documentation

## 2018-05-07 DIAGNOSIS — Z961 Presence of intraocular lens: Secondary | ICD-10-CM | POA: Diagnosis not present

## 2018-05-07 DIAGNOSIS — Z86010 Personal history of colonic polyps: Secondary | ICD-10-CM | POA: Insufficient documentation

## 2018-05-07 DIAGNOSIS — Z8249 Family history of ischemic heart disease and other diseases of the circulatory system: Secondary | ICD-10-CM | POA: Diagnosis not present

## 2018-05-07 DIAGNOSIS — M62838 Other muscle spasm: Secondary | ICD-10-CM | POA: Diagnosis not present

## 2018-05-07 DIAGNOSIS — Z801 Family history of malignant neoplasm of trachea, bronchus and lung: Secondary | ICD-10-CM | POA: Diagnosis not present

## 2018-05-07 DIAGNOSIS — Z9842 Cataract extraction status, left eye: Secondary | ICD-10-CM | POA: Insufficient documentation

## 2018-05-07 DIAGNOSIS — I2510 Atherosclerotic heart disease of native coronary artery without angina pectoris: Secondary | ICD-10-CM | POA: Insufficient documentation

## 2018-05-07 DIAGNOSIS — G8191 Hemiplegia, unspecified affecting right dominant side: Secondary | ICD-10-CM | POA: Diagnosis not present

## 2018-05-07 DIAGNOSIS — Z9841 Cataract extraction status, right eye: Secondary | ICD-10-CM | POA: Diagnosis not present

## 2018-05-07 DIAGNOSIS — K219 Gastro-esophageal reflux disease without esophagitis: Secondary | ICD-10-CM | POA: Diagnosis not present

## 2018-05-07 DIAGNOSIS — I10 Essential (primary) hypertension: Secondary | ICD-10-CM | POA: Diagnosis not present

## 2018-05-07 DIAGNOSIS — Z96652 Presence of left artificial knee joint: Secondary | ICD-10-CM | POA: Diagnosis not present

## 2018-05-07 MED ORDER — METOPROLOL TARTRATE 25 MG PO TABS: 12.5000 mg | ORAL_TABLET | Freq: Two times a day (BID) | ORAL | 0 refills | Status: DC

## 2018-05-07 NOTE — Progress Notes (Signed)
Subjective:    Patient ID: Isabel Hernandez, female    DOB: [birth year], 77 y.o.   MRN: [id number]  HPI: Isabel Hernandez is a 77 year old female who is her for transitional care follow up for ischemic stroke of frontal lobe, right hemiparesis, HTN and chronic atrial fibrillation. She has been home will be receiving home health therapies via [place]. She states she has pain in her right ht foot. She rates her pain 1. She is walking in the home with walker.  Also reports fair appetite.    Pain Inventory Average Pain 3 Pain Right Now 1 My pain is sharp and dull  In the last 24 hours, has pain interfered with the following? General activity 0 Relation with others 0 Enjoyment of life 0 What TIME of day is your pain at its worst? varies with activity Sleep (in general) Fair  Pain is worse with: walking Pain improves with: medication Relief from Meds: 5  Mobility walk with assistance use a walker ability to climb steps?  no do you drive?  no  Function retired I need assistance with the following:  bathing, meal prep, household duties and shopping  Neuro/Psych weakness tremor tingling trouble walking spasms dizziness depression anxiety  Prior Studies transitional care  Physicians involved in your care transitional care   Family History  Problem Relation Age of Onset  . Lung cancer Father   . Prostate cancer Father   . Hyperlipidemia Brother   . Hypertension Brother   . Obesity Brother   . Heart disease Mother   . Lung cancer Sister   . Kidney disease Unknown        Maternal grandparent  . Breast cancer Neg Hx    Social History   Socioeconomic History  . Marital status: Married    Spouse name: Not on file  . Number of children: Not on file  . Years of education: Not on file  . Highest education level: Not on file  Occupational History  . Occupation: retired    Comment: Nurse, adult  . Financial resource strain: Not on file  . Food  insecurity:    Worry: Not on file    Inability: Not on file  . Transportation needs:    Medical: Not on file    Non-medical: Not on file  Tobacco Use  . Smoking status: Former Smoker    Years: 10.00    Types: Cigarettes    Last attempt to quit: 10/03/1969    Years since quitting: 48.6  . Smokeless tobacco: Never Used  Substance and Sexual Activity  . Alcohol use: Yes    Alcohol/week: 0.0 oz    Comment: couple drinks/week  . Drug use: No  . Sexual activity: Not on file  Lifestyle  . Physical activity:    Days per week: Not on file    Minutes per session: Not on file  . Stress: Not on file  Relationships  . Social connections:    Talks on phone: Not on file    Gets together: Not on file    Attends religious service: Not on file    Active member of club or organization: Not on file    Attends meetings of clubs or organizations: Not on file    Relationship status: Not on file  Other Topics Concern  . Not on file  Social History Narrative  . Not on file   Past Surgical History:  Procedure Laterality Date  .  APPENDECTOMY    . BREAST BIOPSY Right 2002   POS  . BREAST CYST ASPIRATION Right   . BREAST EXCISIONAL BIOPSY Left 1997   NEG  . BREAST LUMPECTOMY    . CATARACT EXTRACTION W/PHACO Right 06/07/2016   Procedure: CATARACT EXTRACTION PHACO AND INTRAOCULAR LENS PLACEMENT (IOC);  Surgeon: Birder Robson, MD;  Location: [facility] ORS;  Service: Ophthalmology;  Laterality: Right;  [place] 00:51 AP% 19.6 CDE 10.09 Fluid pack lot # [id number]  . CATARACT EXTRACTION W/PHACO Left 07/05/2016   Procedure: CATARACT EXTRACTION PHACO AND INTRAOCULAR LENS PLACEMENT (IOC);  Surgeon: Birder Robson, MD;  Location: [facility] ORS;  Service: Ophthalmology;  Laterality: Left;  [place] 00:38 AP% 24.9 CDE 9.60 Fluid Pack lot # [id number]  . CESAREAN SECTION    . TONSILLECTOMY    . TOTAL KNEE ARTHROPLASTY Left    Past Medical History:  Diagnosis Date  . A-fib ([place])   . Arthritis   . Breast cancer ([place])  2002   RT LUMPECTOMY  . Bronchitis   . Carotid artery narrowings   . 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 Carotid artery occlusion    RIGHT 50% BLOCKAGE  . Colon polyps   . Complication of anesthesia    shaking/ FOLLOWING LOCAL AT DENTIST , drop O2 sats TO 50% DURING COLONOSCOPY  . Coronary artery disease   . Diverticulosis   . Dysrhythmia   . Environmental allergies   . GERD (gastroesophageal reflux disease)   . History of hiatal hernia   . Hyperlipidemia   . Hypertension   . IBS (irritable bowel syndrome)   . Mitral valve disorder   . Shortness of breath dyspnea   . Syncope and collapse   . UTI (lower urinary tract infection)    Ht 5\' 4"  (1.626 m)   Wt 155 lb (70.3 kg)   BMI 26.61 kg/m   Opioid Risk Score:   Fall Risk Score:  `1  Depression screen PHQ 2/9  Depression screen [facility] 2/9 05/07/2018 11/25/2016  Decreased Interest 0 0  Down, Depressed, Hopeless 1 0  PHQ - 2 Score 1 0  Altered sleeping 1 -  Tired, decreased energy 2 -  Change in appetite 2 -  Feeling bad or failure about yourself  0 -  Trouble concentrating 0 -  Moving slowly or fidgety/restless 0 -  Suicidal thoughts 0 -  PHQ-9 Score 6 -    Review of Systems  Constitutional: Negative.   HENT: Negative.   Eyes: Negative.   Respiratory: Positive for shortness of breath.   Cardiovascular: Negative.   Gastrointestinal: Negative.   Endocrine: Negative.   Genitourinary: Positive for frequency.  Musculoskeletal: Positive for back pain and gait problem.       Spasms  Skin: Negative.   Allergic/Immunologic: Negative.   Neurological: Positive for dizziness, tremors and weakness.       Tingling   Psychiatric/Behavioral: Positive for dysphoric mood. The patient is nervous/anxious.   All other systems reviewed and are negative.      Objective:   Physical Exam  Constitutional: She is oriented to person, place, and time. She appears well-developed and well-nourished.  HENT:  Head:  Normocephalic and atraumatic.  Neck: Normal range of motion. Neck supple.  Cardiovascular: Normal rate and regular rhythm.  Pulmonary/Chest: Effort normal and breath sounds normal.  Musculoskeletal:  Normal Muscle Bulk and Muscle Testing Reveals:  Upper Extremities: Full ROM and Muscle Strength on the Right 4/5 and Left 5/5 Lower Extremities: Full ROM  and Muscle Strength 5/5 Arises from Table Slowly using walker for support Gait is steaday with normal steps  Neurological: She is alert and oriented to person, place, and time.  Skin: Skin is warm and dry.  Psychiatric: She has a normal mood and affect. Her behavior is normal.  Nursing note and vitals reviewed.         Assessment & Plan:  1. Ischemic Stroke of Frontal Lobe/ Right Hemiparesis: Continue Home Health Therapies; F/U with Neurology 2. HTN: Continue current medication regimen: F?U with PCP 3. Chronic Atrial Fibrillation: Continue current medication regimen and follow up with Cardiology. 4. Muscle Spasm: Continue Baclofen   25 minutes of face to face patient care time was spent during this visit. All questions were encouraged and answered.  F/U in 4-6 weeks with Dr Letta Pate

## 2018-05-07 NOTE — Telephone Encounter (Signed)
I think she should see Dr. Caryl Comes for a loop recorder placement. She was hospitalized recently with a stroke.  A. fib was mentioned in her chart but I do not think she ever had documented atrial fibrillation.  She was started on anticoagulation but she might not need that if there is no documented atrial fibrillation.

## 2018-05-08 ENCOUNTER — Encounter: Payer: Medicare Other | Admitting: Registered Nurse

## 2018-05-09 DIAGNOSIS — G9389 Other specified disorders of brain: Secondary | ICD-10-CM | POA: Diagnosis not present

## 2018-05-09 DIAGNOSIS — I10 Essential (primary) hypertension: Secondary | ICD-10-CM | POA: Diagnosis not present

## 2018-05-09 DIAGNOSIS — I4891 Unspecified atrial fibrillation: Secondary | ICD-10-CM | POA: Diagnosis not present

## 2018-05-09 DIAGNOSIS — E7800 Pure hypercholesterolemia, unspecified: Secondary | ICD-10-CM | POA: Diagnosis not present

## 2018-05-09 DIAGNOSIS — R202 Paresthesia of skin: Secondary | ICD-10-CM | POA: Diagnosis not present

## 2018-05-09 DIAGNOSIS — M48062 Spinal stenosis, lumbar region with neurogenic claudication: Secondary | ICD-10-CM | POA: Diagnosis not present

## 2018-05-09 DIAGNOSIS — K219 Gastro-esophageal reflux disease without esophagitis: Secondary | ICD-10-CM | POA: Diagnosis not present

## 2018-05-09 DIAGNOSIS — I480 Paroxysmal atrial fibrillation: Secondary | ICD-10-CM | POA: Diagnosis not present

## 2018-05-09 DIAGNOSIS — I69393 Ataxia following cerebral infarction: Secondary | ICD-10-CM | POA: Diagnosis not present

## 2018-05-09 DIAGNOSIS — Z8673 Personal history of transient ischemic attack (TIA), and cerebral infarction without residual deficits: Secondary | ICD-10-CM | POA: Diagnosis not present

## 2018-05-09 DIAGNOSIS — I69351 Hemiplegia and hemiparesis following cerebral infarction affecting right dominant side: Secondary | ICD-10-CM | POA: Diagnosis not present

## 2018-05-09 NOTE — Telephone Encounter (Signed)
I left a message for the patient to call. 

## 2018-05-10 ENCOUNTER — Ambulatory Visit: Payer: Medicare Other

## 2018-05-11 DIAGNOSIS — I4891 Unspecified atrial fibrillation: Secondary | ICD-10-CM | POA: Diagnosis not present

## 2018-05-11 DIAGNOSIS — I69351 Hemiplegia and hemiparesis following cerebral infarction affecting right dominant side: Secondary | ICD-10-CM | POA: Diagnosis not present

## 2018-05-11 DIAGNOSIS — M48062 Spinal stenosis, lumbar region with neurogenic claudication: Secondary | ICD-10-CM | POA: Diagnosis not present

## 2018-05-11 DIAGNOSIS — I69393 Ataxia following cerebral infarction: Secondary | ICD-10-CM | POA: Diagnosis not present

## 2018-05-11 DIAGNOSIS — I10 Essential (primary) hypertension: Secondary | ICD-10-CM | POA: Diagnosis not present

## 2018-05-11 DIAGNOSIS — G9389 Other specified disorders of brain: Secondary | ICD-10-CM | POA: Diagnosis not present

## 2018-05-13 NOTE — Telephone Encounter (Signed)
H schedule her as you feel the need Thanks steve

## 2018-05-14 DIAGNOSIS — G252 Other specified forms of tremor: Secondary | ICD-10-CM | POA: Diagnosis not present

## 2018-05-14 DIAGNOSIS — G9389 Other specified disorders of brain: Secondary | ICD-10-CM | POA: Diagnosis not present

## 2018-05-14 DIAGNOSIS — I4891 Unspecified atrial fibrillation: Secondary | ICD-10-CM | POA: Diagnosis not present

## 2018-05-14 DIAGNOSIS — I69393 Ataxia following cerebral infarction: Secondary | ICD-10-CM | POA: Diagnosis not present

## 2018-05-14 DIAGNOSIS — K219 Gastro-esophageal reflux disease without esophagitis: Secondary | ICD-10-CM | POA: Diagnosis not present

## 2018-05-14 DIAGNOSIS — M48062 Spinal stenosis, lumbar region with neurogenic claudication: Secondary | ICD-10-CM | POA: Diagnosis not present

## 2018-05-14 DIAGNOSIS — I69351 Hemiplegia and hemiparesis following cerebral infarction affecting right dominant side: Secondary | ICD-10-CM | POA: Diagnosis not present

## 2018-05-14 DIAGNOSIS — D696 Thrombocytopenia, unspecified: Secondary | ICD-10-CM | POA: Diagnosis not present

## 2018-05-14 DIAGNOSIS — I10 Essential (primary) hypertension: Secondary | ICD-10-CM | POA: Diagnosis not present

## 2018-05-14 DIAGNOSIS — Z87891 Personal history of nicotine dependence: Secondary | ICD-10-CM | POA: Diagnosis not present

## 2018-05-14 NOTE — Telephone Encounter (Signed)
I spoke with the patient. She states she followed up with Dr. Candiss Norse and she has referred her to Dr. Ubaldo Glassing. She states she will be following with Dr. Ubaldo Glassing for now and requested that I also cancel her upcoming appointment with Dr. Fletcher Anon in September. I have advised her I will cancel her current follow up.

## 2018-05-15 ENCOUNTER — Encounter: Payer: Medicare Other | Admitting: Occupational Therapy

## 2018-05-15 ENCOUNTER — Ambulatory Visit: Payer: Medicare Other | Admitting: Physical Therapy

## 2018-05-17 DIAGNOSIS — I69393 Ataxia following cerebral infarction: Secondary | ICD-10-CM | POA: Diagnosis not present

## 2018-05-17 DIAGNOSIS — I4891 Unspecified atrial fibrillation: Secondary | ICD-10-CM | POA: Diagnosis not present

## 2018-05-17 DIAGNOSIS — I69351 Hemiplegia and hemiparesis following cerebral infarction affecting right dominant side: Secondary | ICD-10-CM | POA: Diagnosis not present

## 2018-05-17 DIAGNOSIS — M48062 Spinal stenosis, lumbar region with neurogenic claudication: Secondary | ICD-10-CM | POA: Diagnosis not present

## 2018-05-17 DIAGNOSIS — G9389 Other specified disorders of brain: Secondary | ICD-10-CM | POA: Diagnosis not present

## 2018-05-17 DIAGNOSIS — I10 Essential (primary) hypertension: Secondary | ICD-10-CM | POA: Diagnosis not present

## 2018-05-20 ENCOUNTER — Emergency Department: Payer: Medicare Other

## 2018-05-20 ENCOUNTER — Other Ambulatory Visit: Payer: Self-pay

## 2018-05-20 ENCOUNTER — Inpatient Hospital Stay
Admission: EM | Admit: 2018-05-20 | Discharge: 2018-05-21 | DRG: 082 | Disposition: A | Discharge status: Other Acute Inpatient Hospital | Payer: Medicare Other | Attending: Emergency Medicine | Admitting: Emergency Medicine

## 2018-05-20 DIAGNOSIS — Z8349 Family history of other endocrine, nutritional and metabolic diseases: Secondary | ICD-10-CM

## 2018-05-20 DIAGNOSIS — Y92009 Unspecified place in unspecified non-institutional (private) residence as the place of occurrence of the external cause: Secondary | ICD-10-CM

## 2018-05-20 DIAGNOSIS — S79911A Unspecified injury of right hip, initial encounter: Secondary | ICD-10-CM | POA: Diagnosis not present

## 2018-05-20 DIAGNOSIS — Z9989 Dependence on other enabling machines and devices: Secondary | ICD-10-CM | POA: Diagnosis not present

## 2018-05-20 DIAGNOSIS — E785 Hyperlipidemia, unspecified: Secondary | ICD-10-CM | POA: Diagnosis present

## 2018-05-20 DIAGNOSIS — K219 Gastro-esophageal reflux disease without esophagitis: Secondary | ICD-10-CM | POA: Diagnosis present

## 2018-05-20 DIAGNOSIS — S0181XA Laceration without foreign body of other part of head, initial encounter: Secondary | ICD-10-CM | POA: Diagnosis present

## 2018-05-20 DIAGNOSIS — S199XXA Unspecified injury of neck, initial encounter: Secondary | ICD-10-CM | POA: Diagnosis not present

## 2018-05-20 DIAGNOSIS — Z888 Allergy status to other drugs, medicaments and biological substances status: Secondary | ICD-10-CM

## 2018-05-20 DIAGNOSIS — S0003XA Contusion of scalp, initial encounter: Secondary | ICD-10-CM | POA: Diagnosis not present

## 2018-05-20 DIAGNOSIS — I639 Cerebral infarction, unspecified: Secondary | ICD-10-CM | POA: Diagnosis not present

## 2018-05-20 DIAGNOSIS — S52501A Unspecified fracture of the lower end of right radius, initial encounter for closed fracture: Secondary | ICD-10-CM | POA: Diagnosis present

## 2018-05-20 DIAGNOSIS — I619 Nontraumatic intracerebral hemorrhage, unspecified: Secondary | ICD-10-CM | POA: Diagnosis not present

## 2018-05-20 DIAGNOSIS — R52 Pain, unspecified: Secondary | ICD-10-CM

## 2018-05-20 DIAGNOSIS — I480 Paroxysmal atrial fibrillation: Secondary | ICD-10-CM | POA: Diagnosis not present

## 2018-05-20 DIAGNOSIS — S3992XA Unspecified injury of lower back, initial encounter: Secondary | ICD-10-CM | POA: Diagnosis not present

## 2018-05-20 DIAGNOSIS — Z853 Personal history of malignant neoplasm of breast: Secondary | ICD-10-CM

## 2018-05-20 DIAGNOSIS — Z801 Family history of malignant neoplasm of trachea, bronchus and lung: Secondary | ICD-10-CM | POA: Diagnosis not present

## 2018-05-20 DIAGNOSIS — Z7901 Long term (current) use of anticoagulants: Secondary | ICD-10-CM | POA: Diagnosis not present

## 2018-05-20 DIAGNOSIS — Z961 Presence of intraocular lens: Secondary | ICD-10-CM | POA: Diagnosis present

## 2018-05-20 DIAGNOSIS — S7001XA Contusion of right hip, initial encounter: Secondary | ICD-10-CM | POA: Diagnosis present

## 2018-05-20 DIAGNOSIS — Z885 Allergy status to narcotic agent status: Secondary | ICD-10-CM | POA: Diagnosis not present

## 2018-05-20 DIAGNOSIS — Z8042 Family history of malignant neoplasm of prostate: Secondary | ICD-10-CM | POA: Diagnosis not present

## 2018-05-20 DIAGNOSIS — Z91040 Latex allergy status: Secondary | ICD-10-CM | POA: Diagnosis not present

## 2018-05-20 DIAGNOSIS — S3991XA Unspecified injury of abdomen, initial encounter: Secondary | ICD-10-CM | POA: Diagnosis not present

## 2018-05-20 DIAGNOSIS — M25551 Pain in right hip: Secondary | ICD-10-CM | POA: Diagnosis not present

## 2018-05-20 DIAGNOSIS — R0902 Hypoxemia: Secondary | ICD-10-CM | POA: Diagnosis present

## 2018-05-20 DIAGNOSIS — Z880 Allergy status to penicillin: Secondary | ICD-10-CM | POA: Diagnosis not present

## 2018-05-20 DIAGNOSIS — I2510 Atherosclerotic heart disease of native coronary artery without angina pectoris: Secondary | ICD-10-CM | POA: Diagnosis present

## 2018-05-20 DIAGNOSIS — M8580 Other specified disorders of bone density and structure, unspecified site: Secondary | ICD-10-CM | POA: Diagnosis not present

## 2018-05-20 DIAGNOSIS — J9601 Acute respiratory failure with hypoxia: Secondary | ICD-10-CM

## 2018-05-20 DIAGNOSIS — Z8249 Family history of ischemic heart disease and other diseases of the circulatory system: Secondary | ICD-10-CM | POA: Diagnosis not present

## 2018-05-20 DIAGNOSIS — D496 Neoplasm of unspecified behavior of brain: Secondary | ICD-10-CM | POA: Diagnosis present

## 2018-05-20 DIAGNOSIS — S066X9A Traumatic subarachnoid hemorrhage with loss of consciousness of unspecified duration, initial encounter: Principal | ICD-10-CM | POA: Diagnosis present

## 2018-05-20 DIAGNOSIS — Z9841 Cataract extraction status, right eye: Secondary | ICD-10-CM

## 2018-05-20 DIAGNOSIS — I482 Chronic atrial fibrillation: Secondary | ICD-10-CM | POA: Diagnosis present

## 2018-05-20 DIAGNOSIS — S52571A Other intraarticular fracture of lower end of right radius, initial encounter for closed fracture: Secondary | ICD-10-CM | POA: Diagnosis not present

## 2018-05-20 DIAGNOSIS — G40109 Localization-related (focal) (partial) symptomatic epilepsy and epileptic syndromes with simple partial seizures, not intractable, without status epilepticus: Secondary | ICD-10-CM | POA: Diagnosis not present

## 2018-05-20 DIAGNOSIS — W19XXXA Unspecified fall, initial encounter: Secondary | ICD-10-CM | POA: Diagnosis not present

## 2018-05-20 DIAGNOSIS — I69319 Unspecified symptoms and signs involving cognitive functions following cerebral infarction: Secondary | ICD-10-CM | POA: Diagnosis not present

## 2018-05-20 DIAGNOSIS — S52121A Displaced fracture of head of right radius, initial encounter for closed fracture: Secondary | ICD-10-CM | POA: Diagnosis not present

## 2018-05-20 DIAGNOSIS — R079 Chest pain, unspecified: Secondary | ICD-10-CM | POA: Diagnosis not present

## 2018-05-20 DIAGNOSIS — S299XXA Unspecified injury of thorax, initial encounter: Secondary | ICD-10-CM | POA: Diagnosis not present

## 2018-05-20 DIAGNOSIS — G8191 Hemiplegia, unspecified affecting right dominant side: Secondary | ICD-10-CM | POA: Diagnosis not present

## 2018-05-20 DIAGNOSIS — S0990XA Unspecified injury of head, initial encounter: Secondary | ICD-10-CM | POA: Diagnosis not present

## 2018-05-20 DIAGNOSIS — M21371 Foot drop, right foot: Secondary | ICD-10-CM | POA: Diagnosis not present

## 2018-05-20 DIAGNOSIS — Z87891 Personal history of nicotine dependence: Secondary | ICD-10-CM

## 2018-05-20 DIAGNOSIS — S3993XA Unspecified injury of pelvis, initial encounter: Secondary | ICD-10-CM | POA: Diagnosis not present

## 2018-05-20 DIAGNOSIS — I69351 Hemiplegia and hemiparesis following cerebral infarction affecting right dominant side: Secondary | ICD-10-CM | POA: Diagnosis not present

## 2018-05-20 DIAGNOSIS — R51 Headache: Secondary | ICD-10-CM | POA: Diagnosis not present

## 2018-05-20 DIAGNOSIS — C719 Malignant neoplasm of brain, unspecified: Secondary | ICD-10-CM | POA: Diagnosis not present

## 2018-05-20 DIAGNOSIS — C718 Malignant neoplasm of overlapping sites of brain: Secondary | ICD-10-CM | POA: Diagnosis not present

## 2018-05-20 DIAGNOSIS — Z79899 Other long term (current) drug therapy: Secondary | ICD-10-CM

## 2018-05-20 DIAGNOSIS — Z96652 Presence of left artificial knee joint: Secondary | ICD-10-CM | POA: Diagnosis present

## 2018-05-20 LAB — BASIC METABOLIC PANEL
ANION GAP: 8 (ref 5–15)
BUN: 10 mg/dL (ref 8–23)
CALCIUM: 9.8 mg/dL (ref 8.9–10.3)
CO2: 25 mmol/L (ref 22–32)
Chloride: 107 mmol/L (ref 98–111)
Creatinine, Ser: 0.66 mg/dL (ref 0.44–1.00)
GLUCOSE: 96 mg/dL (ref 70–99)
POTASSIUM: 3.6 mmol/L (ref 3.5–5.1)
Sodium: 140 mmol/L (ref 135–145)

## 2018-05-20 LAB — CBC WITH DIFFERENTIAL/PLATELET
BASOS ABS: 0 10*3/uL (ref 0–0.1)
BASOS PCT: 1 %
Eosinophils Absolute: 0.3 10*3/uL (ref 0–0.7)
Eosinophils Relative: 5 %
HEMATOCRIT: 39.1 % (ref 35.0–47.0)
Hemoglobin: 13.6 g/dL (ref 12.0–16.0)
LYMPHS PCT: 43 %
Lymphs Abs: 3 10*3/uL (ref 1.0–3.6)
MCH: 34.1 pg — ABNORMAL HIGH (ref 26.0–34.0)
MCHC: 34.9 g/dL (ref 32.0–36.0)
MCV: 97.8 fL (ref 80.0–100.0)
MONO ABS: 1.1 10*3/uL — AB (ref 0.2–0.9)
Monocytes Relative: 16 %
NEUTROS ABS: 2.4 10*3/uL (ref 1.4–6.5)
Neutrophils Relative %: 35 %
PLATELETS: 128 10*3/uL — AB (ref 150–440)
RBC: 3.99 MIL/uL (ref 3.80–5.20)
RDW: 12.1 % (ref 11.5–14.5)
WBC: 6.8 10*3/uL (ref 3.6–11.0)

## 2018-05-20 LAB — PROTIME-INR
INR: 1.22
PROTHROMBIN TIME: 15.3 s — AB (ref 11.4–15.2)

## 2018-05-20 LAB — APTT: aPTT: 31 seconds (ref 24–36)

## 2018-05-20 MED ORDER — MORPHINE SULFATE (PF) 4 MG/ML IV SOLN
INTRAVENOUS | Status: AC
  Filled 2018-05-20: qty 1

## 2018-05-20 MED ORDER — MIDAZOLAM HCL 5 MG/5ML IJ SOLN
0.5000 mg | Freq: Once | INTRAMUSCULAR | Status: AC
  Administered 2018-05-20: 0.5 mg via INTRAVENOUS
  Filled 2018-05-20: qty 5

## 2018-05-20 MED ORDER — MORPHINE SULFATE (PF) 4 MG/ML IV SOLN
4.0000 mg | Freq: Once | INTRAVENOUS | Status: AC
  Administered 2018-05-20: 4 mg via INTRAVENOUS

## 2018-05-20 MED ORDER — MORPHINE SULFATE (PF) 4 MG/ML IV SOLN
4.0000 mg | Freq: Once | INTRAVENOUS | Status: AC
  Administered 2018-05-20: 4 mg via INTRAVENOUS
  Filled 2018-05-20: qty 1

## 2018-05-20 MED ORDER — GADOBENATE DIMEGLUMINE 529 MG/ML IV SOLN
14.0000 mL | Freq: Once | INTRAVENOUS | Status: AC | PRN
  Administered 2018-05-20: 15 mL via INTRAVENOUS

## 2018-05-20 MED ORDER — ONDANSETRON HCL 4 MG/2ML IJ SOLN
4.0000 mg | INTRAMUSCULAR | Status: AC
  Administered 2018-05-20: 4 mg via INTRAVENOUS
  Filled 2018-05-20: qty 2

## 2018-05-20 NOTE — ED Notes (Signed)
Pt O2 states  Dropped down 84% pt was placed on 2L [place].

## 2018-05-20 NOTE — ED Notes (Signed)
ED Provider at bedside. 

## 2018-05-20 NOTE — ED Notes (Signed)
Laceration right forehead cleaned. Steri-strips applied per MD order

## 2018-05-20 NOTE — ED Triage Notes (Signed)
Pt presents today psot fall from home. Pt has a hemarraghe CVA 2 weeks. LOC possible. A/O Skin tear on elbow. Rhip pain no rotation or shortening.   Pt had a Ischemic Frontal left brain on 04/16/18.Pt has AFIB.

## 2018-05-20 NOTE — ED Notes (Signed)
Patient transported to MRI 

## 2018-05-20 NOTE — ED Provider Notes (Signed)
[facility] Emergency Department Provider Note  ____________________________________________   First MD Initiated Contact with Patient 05/20/18 1706     (approximate)  I have reviewed the triage vital signs and the nursing notes.   HISTORY  Chief Complaint Fall    HPI Isabel Hernandez is a 77 y.o. female with extensive chronic medical history that includes chronic atrial fibrillation and at least one recent prior ischemic stroke.  She is on Eliquis.  She has had persistent difficulty ambulating and needs to use a walker secondary to some persistent right leg weakness after her stroke.  Today she reports that she tried to turn around in her home and fell, striking the right side of her forehead on a counter.  She is uncertain about loss of consciousness.  She now has severe pain in her right hip, pain in her right hand, and some mild pain in the side of her head.  She had a small amount of bleeding but it has resolved.  She is having increased memory issues at home; her husband is at bedside and confirms the memory issue.   Past Medical History:  Diagnosis Date  . A-fib ([place])   . Arthritis   . Breast cancer ([place]) 2002   RT LUMPECTOMY  . Bronchitis   . Carotid artery narrowings   . 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 Carotid artery occlusion    RIGHT 50% BLOCKAGE  . Colon polyps   . Complication of anesthesia    shaking/ FOLLOWING LOCAL AT DENTIST , drop O2 sats TO 50% DURING COLONOSCOPY  . Coronary artery disease   . Diverticulosis   . Dysrhythmia   . Environmental allergies   . GERD (gastroesophageal reflux disease)   . History of hiatal hernia   . Hyperlipidemia   . Hypertension   . IBS (irritable bowel syndrome)   . Mitral valve disorder   . Shortness of breath dyspnea   . Syncope and collapse   . UTI (lower urinary tract infection)     Patient Active Problem List   Diagnosis Date Noted  . Hypoxia 05/20/2018  . PAF (paroxysmal  atrial fibrillation) ([place])   . Thrombocytopenia ([place])   . Acute blood loss anemia   . Ischemic stroke of frontal lobe ([place]) 04/19/2018  . Right hemiparesis ([place])   . Chronic atrial fibrillation ([place])   . Gait disturbance, post-stroke   . UTI (urinary tract infection) 04/17/2018  . Acute CVA (cerebrovascular accident) ([place]) 04/17/2018  . Hip flexor tendinitis 09/08/2017  . 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 Hand or foot spasms 05/09/2017  . Tremor 05/09/2017  . Sinusitis 04/28/2017  . Lumbar spinal stenosis 03/01/2017  . Lumbar radiculopathy, right 01/30/2017  . Right hip pain 08/19/2016  . Pneumonitis 05/19/2016  . GERD (gastroesophageal reflux disease) 05/19/2016  . NSAID long-term use 03/22/2016  . Primary osteoarthritis of left knee 03/22/2016  . Primary osteoarthritis of right hand 03/22/2016  . Rheumatoid factor positive 03/22/2016  . Essential hypertension 02/17/2016  . Carotid stenosis 02/17/2016  . Abnormal chest x-ray 02/17/2016  . Hyperlipidemia 02/17/2016  . Allergic rhinitis 02/09/2016    Past Surgical History:  Procedure Laterality Date  . APPENDECTOMY    . BREAST BIOPSY Right 2002   POS  . BREAST CYST ASPIRATION Right   . BREAST EXCISIONAL BIOPSY Left 1997   NEG  . BREAST LUMPECTOMY    . CATARACT EXTRACTION W/PHACO Right 06/07/2016   Procedure: CATARACT EXTRACTION  PHACO AND INTRAOCULAR LENS PLACEMENT (IOC);  Surgeon: Birder Robson, MD;  Location: [facility] ORS;  Service: Ophthalmology;  Laterality: Right;  [place] 00:51 AP% 19.6 CDE 10.09 Fluid pack lot # [id number]  . CATARACT EXTRACTION W/PHACO Left 07/05/2016   Procedure: CATARACT EXTRACTION PHACO AND INTRAOCULAR LENS PLACEMENT (IOC);  Surgeon: Birder Robson, MD;  Location: [facility] ORS;  Service: Ophthalmology;  Laterality: Left;  [place] 00:38 AP% 24.9 CDE 9.60 Fluid Pack lot # [id number]  . CESAREAN SECTION    . TONSILLECTOMY    . TOTAL KNEE ARTHROPLASTY Left     Prior to Admission medications    Medication Sig Start Date End Date Taking? Authorizing Provider  acetaminophen (TYLENOL) 325 MG tablet Take 1-2 tablets (325-650 mg total) by mouth every 4 (four) hours as needed for mild pain. 04/20/18  Yes Love, Ivan Anchors, PA-C  apixaban (ELIQUIS) 5 MG TABS tablet Take 1 tablet (5 mg total) by mouth 2 (two) times daily. 04/27/18  Yes Love, Ivan Anchors, PA-C  atorvastatin (LIPITOR) 40 MG tablet Take 1 tablet (40 mg total) by mouth daily at 6 PM. 04/27/18  Yes Love, Ivan Anchors, PA-C  baclofen 5 MG TABS Take 5 mg by mouth 2 (two) times daily as needed for muscle spasms. Patient taking differently: Take 10 mg by mouth 2 (two) times daily as needed for muscle spasms.  05/01/18  Yes Bayard Hugger, NP  clonazePAM (KLONOPIN) 0.25 MG disintegrating tablet Take 1 tablet (0.25 mg total) by mouth 2 (two) times daily. 04/27/18  Yes Love, Ivan Anchors, PA-C  famotidine (PEPCID) 20 MG tablet Take 1 tablet (20 mg total) by mouth 2 (two) times daily. 04/27/18  Yes Love, Ivan Anchors, PA-C  levETIRAcetam (KEPPRA) 500 MG tablet Take 1 tablet (500 mg total) by mouth 2 (two) times daily. 04/27/18  Yes Love, Ivan Anchors, PA-C  metoprolol tartrate (LOPRESSOR) 25 MG tablet Take 0.5 tablets (12.5 mg total) by mouth 2 (two) times daily. 05/07/18  Yes Bayard Hugger, NP  Multiple Vitamin (MULTIVITAMIN WITH MINERALS) TABS tablet Take 1 tablet by mouth daily.   Yes [provider]  clindamycin (CLEOCIN) 150 MG capsule  05/16/18   [provider]  fluticasone (FLONASE) 50 MCG/ACT nasal spray Place 2 sprays into both nostrils daily. Patient not taking: Reported on 05/07/2018 05/09/17   Leone Haven, MD  mirtazapine (REMERON) 7.5 MG tablet Take 1 tablet by mouth at bedtime. 05/18/18 05/18/19  [provider]  senna-docusate (SENOKOT-S) 8.6-50 MG tablet Take 2 tablets by mouth at bedtime as needed for mild constipation. Patient not taking: Reported on 05/20/2018 04/27/18   Bary Leriche, PA-C    Allergies Nitroglycerin;  Penicillins; Biaxin [clarithromycin]; Latex; Lidocaine; Nickel; Oxycodone; Tetracyclines & related; and Tizanidine  Family History  Problem Relation Age of Onset  . Lung cancer Father   . Prostate cancer Father   . Hyperlipidemia Brother   . Hypertension Brother   . Obesity Brother   . Heart disease Mother   . Lung cancer Sister   . Kidney disease Unknown        Maternal grandparent  . Breast cancer Neg Hx     Social History Social History   Tobacco Use  . Smoking status: Former Smoker    Years: 10.00    Types: Cigarettes    Last attempt to quit: 10/03/1969    Years since quitting: 48.6  . Smokeless tobacco: Never Used  Substance Use Topics  . Alcohol use: Yes    Alcohol/week:  0.0 standard drinks    Comment: couple drinks/week  . Drug use: No    Review of Systems Constitutional: No fever/chills Eyes: No visual changes. ENT: No sore throat. Cardiovascular: Denies chest pain. Respiratory: Denies shortness of breath. Gastrointestinal: No abdominal pain.  No nausea, no vomiting.  No diarrhea.  No constipation. Genitourinary: Negative for dysuria. Musculoskeletal: Pain in right hip and right hand.  Some pain in the right side of her forehead. Integumentary: Negative for rash.  Small laceration in the right side of the patient's forehead. Neurological: Negative for headaches, focal weakness or numbness.   ____________________________________________   PHYSICAL EXAM:  ED Triage Vitals  Enc Vitals Group     BP 05/20/18 1900 136/73     Pulse Rate 05/20/18 1700 80     Resp 05/20/18 1700 20     Temp --      Temp src --      SpO2 05/20/18 1658 99 %     Weight 05/20/18 1659 70.4 kg (155 lb 2.6 oz)     Height 05/20/18 1659 1.626 m (5\' 4" )     Head Circumference --      Peak Flow --      Pain Score 05/20/18 1659 10     Pain Loc --      Pain Edu? --      Excl. in [place]? --     Constitutional: Alert and oriented but also confused about recent events. Eyes: Conjunctivae  are normal.  Pupils are equal and reactive bilaterally. Head: Contusion to the right lateral forehead with a small laceration. Nose: No congestion/rhinnorhea. Mouth/Throat: Mucous membranes are moist. Neck: No stridor.  No meningeal signs.   Cardiovascular: Normal rate, regular rhythm. Good peripheral circulation. Grossly normal heart sounds. Respiratory: Normal respiratory effort.  No retractions. Lungs CTAB. Gastrointestinal: Soft and nontender. No distention.  Musculoskeletal: No lower extremity tenderness nor edema. No gross deformities of extremities. Neurologic:  Normal speech and language. No gross focal neurologic deficits are appreciated.  Skin:  Skin is warm, dry and intact. No rash noted. Psychiatric: Mood and affect are normal. Speech and behavior are normal.  ____________________________________________   LABS (all labs ordered are listed, but only abnormal results are displayed)  Labs Reviewed  PROTIME-INR - Abnormal; Notable for the following components:      Result Value   Prothrombin Time 15.3 (*)    All other components within normal limits  CBC WITH DIFFERENTIAL/PLATELET - Abnormal; Notable for the following components:   MCH 34.1 (*)    Platelets 128 (*)    Monocytes Absolute 1.1 (*)    All other components within normal limits  APTT  BASIC METABOLIC PANEL   ____________________________________________  EKG  ED ECG REPORT I, Hinda Kehr, the attending physician, personally viewed and interpreted this ECG.  Date: 05/20/2018 EKG Time: 17:01 Rate: 79 Rhythm: normal sinus rhythm QRS Axis: normal Intervals: normal ST/T Wave abnormalities: Non-specific ST segment / T-wave changes, but no evidence of acute ischemia. Narrative Interpretation: no evidence of acute ischemia   ____________________________________________  RADIOLOGY I, Hinda Kehr, personally viewed and evaluated these images (plain radiographs) as part of my medical decision making, as  well as reviewing the written report by the radiologist.  ED MD interpretation:  No acute abnormalities on CXR. Fracture of right distal radius.  No obvious fracture on hip radiograph.  Official radiology report(s): Ct Head Wo Contrast  Result Date: 05/20/2018 CLINICAL DATA:  Fall with laceration to the right temporal area,  headache EXAM: CT HEAD WITHOUT CONTRAST CT CERVICAL SPINE WITHOUT CONTRAST TECHNIQUE: Multidetector CT imaging of the head and cervical spine was performed following the standard protocol without intravenous contrast. Multiplanar CT image reconstructions of the cervical spine were also generated. COMPARISON:  MRI 04/17/2018, CT brain 04/16/2018 FINDINGS: CT HEAD FINDINGS Brain: No hemorrhage is visualized. Hypodensity within the left posterior frontal lobe near the cranial vertex. No midline shift. Atrophy. Mild small vessel ischemic changes of the white matter. Stable ventricle size. Vascular: No hyperdense vessels.  Carotid vascular calcification. Skull: No fracture Sinuses/Orbits: No acute finding. Other: None CT CERVICAL SPINE FINDINGS Alignment: No subluxation.  Facet alignment is within normal limits. Skull base and vertebrae: No acute fracture. No primary bone lesion or focal pathologic process. Soft tissues and spinal canal: No prevertebral fluid or swelling. No visible canal hematoma. Disc levels: Marked degenerative changes C3-C4, C5-C6 and C6-C7 with moderate degenerative change at C4-C5. Multiple level hypertrophic facet degenerative change with multiple level foraminal stenosis. Prominent degenerative change at C1-C2 articulation. Upper chest: Negative. Other: None IMPRESSION: 1. Negative for acute intracranial hemorrhage. 2. Hypodensity within the left posterior frontal lobe near the vertex. Not certain if this represents evolution of known infarct in the region or possible parenchymal mass lesion; an enhancing lesion was noted in the region on 04/17/2018 MRI. Short interval  4-6 week MRI follow-up was recommended for this finding. 3. Atrophy and mild small vessel ischemic changes of the white matter. 4. Degenerative changes of the cervical spine. No acute osseous abnormality. Electronically Signed   By: Donavan Foil M.D.   On: 05/20/2018 18:22   Ct Cervical Spine Wo Contrast  Result Date: 05/20/2018 CLINICAL DATA:  Fall with laceration to the right temporal area, headache EXAM: CT HEAD WITHOUT CONTRAST CT CERVICAL SPINE WITHOUT CONTRAST TECHNIQUE: Multidetector CT imaging of the head and cervical spine was performed following the standard protocol without intravenous contrast. Multiplanar CT image reconstructions of the cervical spine were also generated. COMPARISON:  MRI 04/17/2018, CT brain 04/16/2018 FINDINGS: CT HEAD FINDINGS Brain: No hemorrhage is visualized. Hypodensity within the left posterior frontal lobe near the cranial vertex. No midline shift. Atrophy. Mild small vessel ischemic changes of the white matter. Stable ventricle size. Vascular: No hyperdense vessels.  Carotid vascular calcification. Skull: No fracture Sinuses/Orbits: No acute finding. Other: None CT CERVICAL SPINE FINDINGS Alignment: No subluxation.  Facet alignment is within normal limits. Skull base and vertebrae: No acute fracture. No primary bone lesion or focal pathologic process. Soft tissues and spinal canal: No prevertebral fluid or swelling. No visible canal hematoma. Disc levels: Marked degenerative changes C3-C4, C5-C6 and C6-C7 with moderate degenerative change at C4-C5. Multiple level hypertrophic facet degenerative change with multiple level foraminal stenosis. Prominent degenerative change at C1-C2 articulation. Upper chest: Negative. Other: None IMPRESSION: 1. Negative for acute intracranial hemorrhage. 2. Hypodensity within the left posterior frontal lobe near the vertex. Not certain if this represents evolution of known infarct in the region or possible parenchymal mass lesion; an  enhancing lesion was noted in the region on 04/17/2018 MRI. Short interval 4-6 week MRI follow-up was recommended for this finding. 3. Atrophy and mild small vessel ischemic changes of the white matter. 4. Degenerative changes of the cervical spine. No acute osseous abnormality. Electronically Signed   By: Donavan Foil M.D.   On: 05/20/2018 18:22   Ct Pelvis Wo Contrast  Result Date: 05/20/2018 CLINICAL DATA:  Status post fall with right hip/groin pain. EXAM: CT PELVIS WITHOUT CONTRAST TECHNIQUE:  Multidetector CT imaging of the pelvis was performed following the standard protocol without intravenous contrast. COMPARISON:  None. FINDINGS: Urinary Tract:  No abnormality visualized. Bowel:  Unremarkable visualized pelvic bowel loops. Vascular/Lymphatic: No pathologically enlarged lymph nodes. Aortic atherosclerotic disease. Reproductive:  Leiomyomatous uterus. Other:  None. Musculoskeletal: No evidence of acute fracture. 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 IMPRESSION: No evidence of acute fracture. 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 Electronically Signed   By: Fidela Salisbury M.D.   On: 05/20/2018 21:08   Mr Brain W And Wo Contrast  Result Date: 05/20/2018 CLINICAL DATA:  Initial evaluation for trauma acute trauma, fall. Question neoplasm versus evolving infarct at posterior left frontal region. EXAM: MRI HEAD WITHOUT AND WITH CONTRAST TECHNIQUE: Multiplanar, multiecho pulse sequences of the brain and surrounding structures were obtained without and with intravenous contrast. CONTRAST:  54mL MULTIHANCE GADOBENATE DIMEGLUMINE 529 MG/ML IV SOLN COMPARISON:  Comparison made with prior CT from earlier the same day as well as recent MRI from 04/17/2018. FINDINGS: Brain: Cerebral volume stable, and within normal limits. Mild scattered nonspecific cerebral white matter changes, most like related  chronic small vessel ischemic disease. Heterogeneous mass measuring 1.7 x 1.8 x 1.6 cm present within the parasagittal left frontoparietal region (series 16, image 123). Adjacent but separate enhancing lesion positioned slightly inferiorly measures 2.3 x 1.0 x 1.4 cm (series 16, image 105). Small focus of intervening nodular enhancement noted (series 17, image 20). Surrounding T2/FLAIR signal abnormality consistent with associated vasogenic edema. Mild mass effect on the adjacent posterior left lateral ventricle. No significant regional mass effect or midline shift. Superimposed areas of susceptibility artifact likely reflect necrosis and/or hemorrhage. Findings have progressed from prior MRI, and are consistent with mass lesion/neoplasm. No other mass lesion or abnormal intracranial enhancement. No evidence for acute or subacute infarct. Gray-white matter differentiation otherwise maintained. Susceptibility artifacts seen extending along the right central sulcus (series 12, image 34). Findings indeterminate, but suspicious for occult subarachnoid hemorrhage, suspected to be related to recent trauma/fall, although intrinsic hemorrhage from the contralateral tumor could also be considered. No other evidence for acute intracranial hemorrhage. No other mass lesion. No midline shift. No hydrocephalus. Basilar cisterns are patent. No extra-axial fluid collection. Normal pituitary gland. Vascular: Major intracranial vascular flow voids are maintained. Skull and upper cervical spine: Craniocervical junction normal. Bone marrow signal intensity within normal limits. Small right frontal scalp contusion noted. Sinuses/Orbits: Globes orbital soft tissues within normal limits. Patient status post ocular lens replacement bilaterally. Paranasal sinuses are clear. No mastoid effusion. Inner ear structures normal. Other: None. IMPRESSION: 1. Two adjacent enhancing lesions measuring up to 2.3 cm positioned at the parasagittal left  frontoparietal region as above, consistent with mass/neoplasm. Localized multifocal primary CNS neoplasm/GBM is favored, although localized intracranial metastases could also be considered. Localized vasogenic edema without significant regional mass effect or midline shift. 2. Small volume susceptibility artifact layering along the right central sulcus, suspicious for occult subarachnoid hemorrhage. Findings suspected to be related to recent fall. 3. Small right frontal scalp contusion. 4. Mild nonspecific cerebral white matter changes. Critical Value/emergent results were called by telephone at the time of interpretation on 05/20/2018 at 10:55 pm to Dr. Hinda Kehr , who verbally acknowledged these results. Electronically Signed   By: Jeannine Boga M.D.   On: 05/20/2018 22:55   Dg Chest Portable 1 View  Result Date: 05/20/2018 CLINICAL DATA:  Pain following fall EXAM: PORTABLE CHEST 1  VIEW COMPARISON:  Chest CT June 13, 2017 FINDINGS: There is no edema or consolidation. Heart size and pulmonary vascularity are normal. No adenopathy. There is aortic atherosclerosis. There are surgical clips in the right axilla. There is no evident pneumothorax. IMPRESSION: No edema or consolidation. No pneumothorax. There is aortic atherosclerosis. Aortic Atherosclerosis (ICD10-I70.0). Electronically Signed   By: Lowella Grip III M.D.   On: 05/20/2018 19:43   Dg Hand Complete Right  Result Date: 05/20/2018 CLINICAL DATA:  Pain following fall EXAM: RIGHT HAND - COMPLETE 3+ VIEW COMPARISON:  None. FINDINGS: Frontal, oblique, and lateral views were obtained. There is a fracture of the distal radius which parallels the cortex, with extension into the radiocarpal joint. No other fractures. No dislocation. There is subluxation at the second, third, and fourth MCP joints. There is moderate osteoarthritic change at all MCP, PIP, and DIP joints. There is also moderate osteoarthritic change in the first  carpal-metacarpal and scaphotrapezial joints. No erosive changes. There is calcification in the triangular fibrocartilage region. IMPRESSION: 1. Nondisplaced fracture distal radius which parallels the cortex. Fracture extends to the radiocarpal joint. 2.  No other fracture evident.  No dislocation. 3. Multifocal osteoarthritic change. Subluxation at the second, third, and fourth PIP joints is felt to be due to arthropathic etiology. 4. Suspect chronic tear of the triangular fibrocartilage with calcification in this area. Electronically Signed   By: Lowella Grip III M.D.   On: 05/20/2018 19:42   Dg Hip Unilat W Or Wo Pelvis 2-3 Views Right  Result Date: 05/20/2018 CLINICAL DATA:  77 year old female status post fall today with hip pain. EXAM: DG HIP (WITH OR WITHOUT PELVIS) 2-3V RIGHT COMPARISON:  Pelvis MRI 02/17/2017. FINDINGS: Femoral heads are normally located. Hip joint spaces appear symmetric and within normal limits. No pelvis fracture identified. Grossly intact proximal left femur. The proximal right femur appears intact on AP and cross-table lateral views. Partially visible levoconvex degenerative appearing lumbar scoliosis. Negative visible bowel gas pattern. Pelvic phleboliths. IMPRESSION: No acute fracture or dislocation identified about the right hip or pelvis. Electronically Signed   By: Genevie Ann M.D.   On: 05/20/2018 18:41    ____________________________________________   PROCEDURES  Critical Care performed: Yes, see critical care procedure note(s)   Procedure(s) performed:   .Splint Application Date/Time: 1/50/5697 12:21 AM Performed by: Hinda Kehr, MD Authorized by: Hinda Kehr, MD   Consent:    Consent obtained:  Verbal   Consent given by:  Patient and spouse Pre-procedure details:    Sensation:  Normal Procedure details:    Laterality:  Right   Location:  Wrist   Wrist:  R wrist   Splint type:  Sugar tong   Supplies:  Ortho-Glass Post-procedure details:     Pain:  Unchanged   Sensation:  Normal   Patient tolerance of procedure:  Tolerated well, no immediate complications .Critical Care Performed by: Hinda Kehr, MD Authorized by: Hinda Kehr, MD   Critical care provider statement:    Critical care time (minutes):  30   Critical care time was exclusive of:  Separately billable procedures and treating other patients   Critical care was necessary to treat or prevent imminent or life-threatening deterioration of the following conditions:  CNS failure or compromise and trauma   Critical care was time spent personally by me on the following activities:  Development of treatment plan with patient or surrogate, discussions with consultants, evaluation of patient's response to treatment, examination of patient, obtaining history from patient or surrogate,  ordering and performing treatments and interventions, ordering and review of laboratory studies, ordering and review of radiographic studies, pulse oximetry, re-evaluation of patient's condition and review of old charts     ____________________________________________   INITIAL IMPRESSION / ASSESSMENT AND PLAN / ED COURSE  As part of my medical decision making, I reviewed the following data within the [place] History obtained from family, Nursing notes reviewed and incorporated, Labs reviewed , EKG interpreted , Old chart reviewed, Radiograph reviewed , Discussed with admitting physician  and Notes from prior ED visits    Differential diagnosis includes, but is not limited to, acute intracranial hemorrhage such as subarachnoid hemorrhage or subdural hemorrhage, worsening ischemic CVA, neoplasm, hand or arm fracture, hip or femur fracture.  The patient has been is very worried about her gradually worsening mental status and balance issues over the last month.  The initial noncontrast head CT was slightly abnormal even for an evolving CVA so I have ordered an MR brain with and  without contrast to look for any evidence of neoplasm and or vasogenic edema.  No evidence of acute fracture dislocation on radiograph of the right hip.  Right hand radiograph reveals distal radius fracture that will require splinting.  The patient has claustrophobia and requires some sedation for MRI so I have authorized Versed 0.5 mg IV; normally I would avoid benzodiazepines in elderly patient but I think under the circumstances it is necessary.  She was hypoxemic to 84% with a good waveform on the pulse oximeter and no subjective shortness of breath but with some  Perioral hypoxemia.  On 2 L of oxygen her O2 level increased to the upper 90s.  I have no strong explanation for her hypoxemia; her chest x-ray is clear and she has no shortness of breath or chest pain.  PE is possible but unlikely.  At this point oxygen is angling the issue and she has other issues to pursue at this time.  Clinical Course as of May 21 38  Nancy Fetter May 20, 2018  2028 Persistent/refractory pain in right hip/pelvis.  Suspect occult fracture.  Ordering CT pelvis without contrast.  Still awaiting MRI; a more emergent patient took her place in line.   [CF]  2129 Fortunately there is no evidence of any fracture in the pelvis or right hip at the area of the patient's pain.  CT PELVIS WO CONTRAST [CF]  2143 Discussed with Dr. Duane Boston, specifically the pain, need for PT/OT and placement, and hypoxemia.  Patient is in MRI right now.  Dr. Duane Boston will admit.   [CF]  2211 Lab work is generally reassuring with no evidence of leukocytosis, normal hemoglobin, normal basic metabolic panel.  Vital signs have been stable on 2 L of oxygen by nasal cannula .  Has been is very worried about the patient's status and agrees with the plan for admission which will likely lead to rehab.   [CF]  2236 Patient requiring third round of IV narcotics due to severe pain in right hip and right distal radius; qualifies as intractable pain   [CF]  2256  Discussed case with Dr. Jeannine Boga (radiology).  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  It has increased in size significantly since her last (non-contrast) MRI last month.  He also said that there was only one sequence that showed a tiny amount of what could be subarachnoid blood, but it is very minimal and not visible at all  on the CT scan.  He thought it would be appropriate for close follow up, but I will discuss with the patient and family for their opinion regarding admission at [facility] or immediate transfer to another facility.   [CF]  Mon May 21, 2018  3817 (Note that documentation was delayed due to multiple ED patients requiring immediate care.)  I discussed the MRI findings and diagnosis with the patient and her husband and they prefer to be transferred to [facility] for further assessment.  I discussed the case by phone with Dr. Kinnie Scales with the neurosurgery and he accepted the patient to the [facility] emergency department.  We discussed the need and risks/benefit of reversing the patient's Eliquis, but I explained that I have had the patient in the emergency department for more than 7 hours with no mental status or neurological changes and it would be my preference to defer any reversal until she is received at the accepting facility.  He is comfortable with this plan based on the description and clinical signs and symptoms I am providing.  The family and patient are comfortable with the plan for transfer.  She is stable and the transportation team is now here to take her to the [facility] emergency department.   [CF]    Clinical Course User Index [CF] Hinda Kehr, MD    ____________________________________________  FINAL CLINICAL IMPRESSION(S) / ED DIAGNOSES  Final diagnoses:  Fall, initial encounter  Acute respiratory failure with hypoxemia ([place])  Closed fracture of distal end of right radius, unspecified fracture morphology, initial encounter   Contusion of right hip, initial encounter  CVA, old, cognitive deficits  Intractable pain  Brain tumor ([place])  Subarachnoid hemorrhage following injury, with loss of consciousness, initial encounter ([place])     MEDICATIONS GIVEN DURING THIS VISIT:  Medications  morphine 4 MG/ML injection (has no administration in time range)  morphine 4 MG/ML injection 4 mg (4 mg Intravenous Given 05/20/18 1828)  ondansetron (ZOFRAN) injection 4 mg (4 mg Intravenous Given 05/20/18 1828)  midazolam (VERSED) 5 MG/5ML injection 0.5 mg (0.5 mg Intravenous Given 05/20/18 1954)  morphine 4 MG/ML injection 4 mg (4 mg Intravenous Given 05/20/18 2102)  gadobenate dimeglumine (MULTIHANCE) injection 14 mL (15 mLs Intravenous Contrast Given 05/20/18 2220)  morphine 4 MG/ML injection 4 mg (4 mg Intravenous Given 05/20/18 2243)     ED Discharge Orders    None       Note:  This document was prepared using Dragon voice recognition software and may include unintentional dictation errors.    Hinda Kehr, MD 05/21/18 [phone]

## 2018-05-20 NOTE — ED Notes (Signed)
Patient's husband's, Arnette Norris [place]ian contact number: [phone]

## 2018-05-20 NOTE — ED Notes (Signed)
.   Pt is resting, Respirations even and unlabored, NAD. Stretcher lowest postion and locked. Call bell within reach. Denies any needs at this time RN will continue to monitor.    

## 2018-05-20 NOTE — ED Notes (Signed)
Jenna RN, aware of bed assigned  

## 2018-05-20 NOTE — ED Notes (Signed)
Sugar-tong splint placed right wrist per MD order.

## 2018-05-20 NOTE — ED Notes (Signed)
MD Karma Greaser at bedside updating patient's husband on current plan of care.

## 2018-05-21 ENCOUNTER — Ambulatory Visit: Payer: Medicare Other | Admitting: Occupational Therapy

## 2018-05-21 ENCOUNTER — Ambulatory Visit: Payer: Medicare Other

## 2018-05-21 DIAGNOSIS — S52501A Unspecified fracture of the lower end of right radius, initial encounter for closed fracture: Secondary | ICD-10-CM | POA: Diagnosis not present

## 2018-05-21 DIAGNOSIS — G936 Cerebral edema: Secondary | ICD-10-CM | POA: Diagnosis not present

## 2018-05-21 DIAGNOSIS — R41841 Cognitive communication deficit: Secondary | ICD-10-CM | POA: Diagnosis not present

## 2018-05-21 DIAGNOSIS — M25512 Pain in left shoulder: Secondary | ICD-10-CM | POA: Diagnosis not present

## 2018-05-21 DIAGNOSIS — I10 Essential (primary) hypertension: Secondary | ICD-10-CM | POA: Diagnosis present

## 2018-05-21 DIAGNOSIS — S52571D Other intraarticular fracture of lower end of right radius, subsequent encounter for closed fracture with routine healing: Secondary | ICD-10-CM | POA: Diagnosis not present

## 2018-05-21 DIAGNOSIS — M5126 Other intervertebral disc displacement, lumbar region: Secondary | ICD-10-CM | POA: Diagnosis present

## 2018-05-21 DIAGNOSIS — E7800 Pure hypercholesterolemia, unspecified: Secondary | ICD-10-CM | POA: Diagnosis not present

## 2018-05-21 DIAGNOSIS — G9389 Other specified disorders of brain: Secondary | ICD-10-CM | POA: Diagnosis not present

## 2018-05-21 DIAGNOSIS — Z7401 Bed confinement status: Secondary | ICD-10-CM | POA: Diagnosis not present

## 2018-05-21 DIAGNOSIS — I482 Chronic atrial fibrillation, unspecified: Secondary | ICD-10-CM | POA: Diagnosis not present

## 2018-05-21 DIAGNOSIS — Z87891 Personal history of nicotine dependence: Secondary | ICD-10-CM | POA: Diagnosis not present

## 2018-05-21 DIAGNOSIS — C713 Malignant neoplasm of parietal lobe: Secondary | ICD-10-CM | POA: Diagnosis not present

## 2018-05-21 DIAGNOSIS — G40109 Localization-related (focal) (partial) symptomatic epilepsy and epileptic syndromes with simple partial seizures, not intractable, without status epilepticus: Secondary | ICD-10-CM | POA: Diagnosis not present

## 2018-05-21 DIAGNOSIS — Z853 Personal history of malignant neoplasm of breast: Secondary | ICD-10-CM | POA: Diagnosis not present

## 2018-05-21 DIAGNOSIS — S3991XA Unspecified injury of abdomen, initial encounter: Secondary | ICD-10-CM | POA: Diagnosis not present

## 2018-05-21 DIAGNOSIS — G8191 Hemiplegia, unspecified affecting right dominant side: Secondary | ICD-10-CM | POA: Diagnosis not present

## 2018-05-21 DIAGNOSIS — R531 Weakness: Secondary | ICD-10-CM | POA: Diagnosis not present

## 2018-05-21 DIAGNOSIS — I639 Cerebral infarction, unspecified: Secondary | ICD-10-CM | POA: Diagnosis not present

## 2018-05-21 DIAGNOSIS — S52571A Other intraarticular fracture of lower end of right radius, initial encounter for closed fracture: Secondary | ICD-10-CM | POA: Diagnosis present

## 2018-05-21 DIAGNOSIS — R51 Headache: Secondary | ICD-10-CM | POA: Diagnosis not present

## 2018-05-21 DIAGNOSIS — S3992XA Unspecified injury of lower back, initial encounter: Secondary | ICD-10-CM | POA: Diagnosis not present

## 2018-05-21 DIAGNOSIS — Z96652 Presence of left artificial knee joint: Secondary | ICD-10-CM | POA: Diagnosis present

## 2018-05-21 DIAGNOSIS — C760 Malignant neoplasm of head, face and neck: Secondary | ICD-10-CM | POA: Diagnosis not present

## 2018-05-21 DIAGNOSIS — Z01818 Encounter for other preprocedural examination: Secondary | ICD-10-CM | POA: Diagnosis not present

## 2018-05-21 DIAGNOSIS — M8580 Other specified disorders of bone density and structure, unspecified site: Secondary | ICD-10-CM | POA: Diagnosis not present

## 2018-05-21 DIAGNOSIS — W19XXXA Unspecified fall, initial encounter: Secondary | ICD-10-CM | POA: Diagnosis not present

## 2018-05-21 DIAGNOSIS — I69351 Hemiplegia and hemiparesis following cerebral infarction affecting right dominant side: Secondary | ICD-10-CM | POA: Diagnosis not present

## 2018-05-21 DIAGNOSIS — E782 Mixed hyperlipidemia: Secondary | ICD-10-CM | POA: Diagnosis present

## 2018-05-21 DIAGNOSIS — I480 Paroxysmal atrial fibrillation: Secondary | ICD-10-CM | POA: Diagnosis present

## 2018-05-21 DIAGNOSIS — I619 Nontraumatic intracerebral hemorrhage, unspecified: Secondary | ICD-10-CM | POA: Diagnosis not present

## 2018-05-21 DIAGNOSIS — J189 Pneumonia, unspecified organism: Secondary | ICD-10-CM | POA: Diagnosis not present

## 2018-05-21 DIAGNOSIS — R569 Unspecified convulsions: Secondary | ICD-10-CM | POA: Diagnosis not present

## 2018-05-21 DIAGNOSIS — M6281 Muscle weakness (generalized): Secondary | ICD-10-CM | POA: Diagnosis not present

## 2018-05-21 DIAGNOSIS — S73191A Other sprain of right hip, initial encounter: Secondary | ICD-10-CM | POA: Diagnosis not present

## 2018-05-21 DIAGNOSIS — M1990 Unspecified osteoarthritis, unspecified site: Secondary | ICD-10-CM | POA: Diagnosis not present

## 2018-05-21 DIAGNOSIS — G939 Disorder of brain, unspecified: Secondary | ICD-10-CM | POA: Diagnosis not present

## 2018-05-21 DIAGNOSIS — R279 Unspecified lack of coordination: Secondary | ICD-10-CM | POA: Diagnosis not present

## 2018-05-21 DIAGNOSIS — K5900 Constipation, unspecified: Secondary | ICD-10-CM | POA: Diagnosis present

## 2018-05-21 DIAGNOSIS — Z7901 Long term (current) use of anticoagulants: Secondary | ICD-10-CM | POA: Diagnosis not present

## 2018-05-21 DIAGNOSIS — H5789 Other specified disorders of eye and adnexa: Secondary | ICD-10-CM | POA: Diagnosis not present

## 2018-05-21 DIAGNOSIS — S065X9A Traumatic subdural hemorrhage with loss of consciousness of unspecified duration, initial encounter: Secondary | ICD-10-CM | POA: Diagnosis not present

## 2018-05-21 DIAGNOSIS — C718 Malignant neoplasm of overlapping sites of brain: Secondary | ICD-10-CM | POA: Diagnosis present

## 2018-05-21 DIAGNOSIS — C719 Malignant neoplasm of brain, unspecified: Secondary | ICD-10-CM | POA: Diagnosis not present

## 2018-05-21 DIAGNOSIS — K219 Gastro-esophageal reflux disease without esophagitis: Secondary | ICD-10-CM | POA: Diagnosis not present

## 2018-05-21 DIAGNOSIS — I69341 Monoplegia of lower limb following cerebral infarction affecting right dominant side: Secondary | ICD-10-CM | POA: Diagnosis not present

## 2018-05-21 DIAGNOSIS — M19041 Primary osteoarthritis, right hand: Secondary | ICD-10-CM | POA: Diagnosis present

## 2018-05-21 DIAGNOSIS — Z9181 History of falling: Secondary | ICD-10-CM | POA: Diagnosis not present

## 2018-05-21 DIAGNOSIS — S299XXA Unspecified injury of thorax, initial encounter: Secondary | ICD-10-CM | POA: Diagnosis not present

## 2018-05-21 DIAGNOSIS — M21371 Foot drop, right foot: Secondary | ICD-10-CM | POA: Diagnosis not present

## 2018-05-21 DIAGNOSIS — D496 Neoplasm of unspecified behavior of brain: Secondary | ICD-10-CM | POA: Diagnosis not present

## 2018-05-21 NOTE — ED Notes (Signed)
Pt leaving att with [facility]

## 2018-05-21 NOTE — ED Notes (Signed)
Patient's husband Arnette Norris Lizardi signed transfer consent with patient approval

## 2018-05-23 ENCOUNTER — Ambulatory Visit: Payer: Medicare Other | Admitting: Neurology

## 2018-05-23 ENCOUNTER — Ambulatory Visit: Payer: Medicare Other

## 2018-05-23 ENCOUNTER — Ambulatory Visit: Payer: Medicare Other | Admitting: Occupational Therapy

## 2018-05-28 ENCOUNTER — Ambulatory Visit: Payer: Medicare Other

## 2018-05-28 ENCOUNTER — Encounter: Payer: Medicare Other | Admitting: Occupational Therapy

## 2018-05-30 ENCOUNTER — Ambulatory Visit: Payer: Medicare Other

## 2018-05-30 ENCOUNTER — Encounter: Payer: Medicare Other | Admitting: Occupational Therapy

## 2018-06-05 ENCOUNTER — Encounter
Admission: RE | Admit: 2018-06-05 | Discharge: 2018-06-05 | Disposition: A | Discharge status: Home / Self Care | Payer: Medicare Other | Source: Ambulatory Visit | Attending: Internal Medicine | Admitting: Internal Medicine

## 2018-06-06 ENCOUNTER — Other Ambulatory Visit: Payer: Self-pay

## 2018-06-06 DIAGNOSIS — Z79899 Other long term (current) drug therapy: Secondary | ICD-10-CM | POA: Diagnosis not present

## 2018-06-06 DIAGNOSIS — C719 Malignant neoplasm of brain, unspecified: Secondary | ICD-10-CM | POA: Diagnosis not present

## 2018-06-06 DIAGNOSIS — M129 Arthropathy, unspecified: Secondary | ICD-10-CM | POA: Diagnosis not present

## 2018-06-06 DIAGNOSIS — Z923 Personal history of irradiation: Secondary | ICD-10-CM | POA: Diagnosis not present

## 2018-06-06 DIAGNOSIS — K449 Diaphragmatic hernia without obstruction or gangrene: Secondary | ICD-10-CM | POA: Diagnosis not present

## 2018-06-06 DIAGNOSIS — F329 Major depressive disorder, single episode, unspecified: Secondary | ICD-10-CM | POA: Diagnosis not present

## 2018-06-06 DIAGNOSIS — M8580 Other specified disorders of bone density and structure, unspecified site: Secondary | ICD-10-CM | POA: Diagnosis not present

## 2018-06-06 DIAGNOSIS — I482 Chronic atrial fibrillation, unspecified: Secondary | ICD-10-CM | POA: Diagnosis not present

## 2018-06-06 DIAGNOSIS — I639 Cerebral infarction, unspecified: Secondary | ICD-10-CM | POA: Diagnosis not present

## 2018-06-06 DIAGNOSIS — Z87891 Personal history of nicotine dependence: Secondary | ICD-10-CM | POA: Diagnosis not present

## 2018-06-06 DIAGNOSIS — R600 Localized edema: Secondary | ICD-10-CM | POA: Diagnosis not present

## 2018-06-06 DIAGNOSIS — Z86010 Personal history of colonic polyps: Secondary | ICD-10-CM | POA: Diagnosis not present

## 2018-06-06 DIAGNOSIS — Z8673 Personal history of transient ischemic attack (TIA), and cerebral infarction without residual deficits: Secondary | ICD-10-CM | POA: Diagnosis not present

## 2018-06-06 DIAGNOSIS — M1990 Unspecified osteoarthritis, unspecified site: Secondary | ICD-10-CM | POA: Diagnosis not present

## 2018-06-06 DIAGNOSIS — K219 Gastro-esophageal reflux disease without esophagitis: Secondary | ICD-10-CM | POA: Diagnosis not present

## 2018-06-06 DIAGNOSIS — R062 Wheezing: Secondary | ICD-10-CM | POA: Diagnosis not present

## 2018-06-06 DIAGNOSIS — M6281 Muscle weakness (generalized): Secondary | ICD-10-CM | POA: Diagnosis not present

## 2018-06-06 DIAGNOSIS — J189 Pneumonia, unspecified organism: Secondary | ICD-10-CM | POA: Diagnosis not present

## 2018-06-06 DIAGNOSIS — R413 Other amnesia: Secondary | ICD-10-CM | POA: Diagnosis not present

## 2018-06-06 DIAGNOSIS — K5909 Other constipation: Secondary | ICD-10-CM | POA: Diagnosis not present

## 2018-06-06 DIAGNOSIS — Z7401 Bed confinement status: Secondary | ICD-10-CM | POA: Diagnosis not present

## 2018-06-06 DIAGNOSIS — G8191 Hemiplegia, unspecified affecting right dominant side: Secondary | ICD-10-CM | POA: Diagnosis not present

## 2018-06-06 DIAGNOSIS — I480 Paroxysmal atrial fibrillation: Secondary | ICD-10-CM | POA: Diagnosis not present

## 2018-06-06 DIAGNOSIS — Y33XXXD Other specified events, undetermined intent, subsequent encounter: Secondary | ICD-10-CM | POA: Diagnosis not present

## 2018-06-06 DIAGNOSIS — I4891 Unspecified atrial fibrillation: Secondary | ICD-10-CM | POA: Diagnosis not present

## 2018-06-06 DIAGNOSIS — R05 Cough: Secondary | ICD-10-CM | POA: Diagnosis not present

## 2018-06-06 DIAGNOSIS — W1839XD Other fall on same level, subsequent encounter: Secondary | ICD-10-CM | POA: Diagnosis not present

## 2018-06-06 DIAGNOSIS — M21371 Foot drop, right foot: Secondary | ICD-10-CM | POA: Diagnosis not present

## 2018-06-06 DIAGNOSIS — F419 Anxiety disorder, unspecified: Secondary | ICD-10-CM | POA: Diagnosis not present

## 2018-06-06 DIAGNOSIS — Z510 Encounter for antineoplastic radiation therapy: Secondary | ICD-10-CM | POA: Diagnosis not present

## 2018-06-06 DIAGNOSIS — K589 Irritable bowel syndrome without diarrhea: Secondary | ICD-10-CM | POA: Diagnosis not present

## 2018-06-06 DIAGNOSIS — Z9221 Personal history of antineoplastic chemotherapy: Secondary | ICD-10-CM | POA: Diagnosis not present

## 2018-06-06 DIAGNOSIS — G40909 Epilepsy, unspecified, not intractable, without status epilepticus: Secondary | ICD-10-CM | POA: Diagnosis not present

## 2018-06-06 DIAGNOSIS — I10 Essential (primary) hypertension: Secondary | ICD-10-CM | POA: Diagnosis not present

## 2018-06-06 DIAGNOSIS — Z801 Family history of malignant neoplasm of trachea, bronchus and lung: Secondary | ICD-10-CM | POA: Diagnosis not present

## 2018-06-06 DIAGNOSIS — R569 Unspecified convulsions: Secondary | ICD-10-CM | POA: Diagnosis not present

## 2018-06-06 DIAGNOSIS — C711 Malignant neoplasm of frontal lobe: Secondary | ICD-10-CM | POA: Diagnosis not present

## 2018-06-06 DIAGNOSIS — D696 Thrombocytopenia, unspecified: Secondary | ICD-10-CM | POA: Diagnosis not present

## 2018-06-06 DIAGNOSIS — R0989 Other specified symptoms and signs involving the circulatory and respiratory systems: Secondary | ICD-10-CM | POA: Diagnosis not present

## 2018-06-06 DIAGNOSIS — E7800 Pure hypercholesterolemia, unspecified: Secondary | ICD-10-CM | POA: Diagnosis not present

## 2018-06-06 DIAGNOSIS — E785 Hyperlipidemia, unspecified: Secondary | ICD-10-CM | POA: Diagnosis not present

## 2018-06-06 DIAGNOSIS — S52571D Other intraarticular fracture of lower end of right radius, subsequent encounter for closed fracture with routine healing: Secondary | ICD-10-CM | POA: Diagnosis not present

## 2018-06-06 DIAGNOSIS — Z9181 History of falling: Secondary | ICD-10-CM | POA: Diagnosis not present

## 2018-06-06 DIAGNOSIS — R531 Weakness: Secondary | ICD-10-CM | POA: Diagnosis not present

## 2018-06-06 DIAGNOSIS — C713 Malignant neoplasm of parietal lobe: Secondary | ICD-10-CM | POA: Diagnosis not present

## 2018-06-06 DIAGNOSIS — G40109 Localization-related (focal) (partial) symptomatic epilepsy and epileptic syndromes with simple partial seizures, not intractable, without status epilepticus: Secondary | ICD-10-CM | POA: Diagnosis not present

## 2018-06-06 DIAGNOSIS — Y33XXXA Other specified events, undetermined intent, initial encounter: Secondary | ICD-10-CM | POA: Diagnosis not present

## 2018-06-06 DIAGNOSIS — R279 Unspecified lack of coordination: Secondary | ICD-10-CM | POA: Diagnosis not present

## 2018-06-06 DIAGNOSIS — R609 Edema, unspecified: Secondary | ICD-10-CM | POA: Diagnosis not present

## 2018-06-06 DIAGNOSIS — I69341 Monoplegia of lower limb following cerebral infarction affecting right dominant side: Secondary | ICD-10-CM | POA: Diagnosis not present

## 2018-06-06 DIAGNOSIS — Y92009 Unspecified place in unspecified non-institutional (private) residence as the place of occurrence of the external cause: Secondary | ICD-10-CM | POA: Diagnosis not present

## 2018-06-06 DIAGNOSIS — Z515 Encounter for palliative care: Secondary | ICD-10-CM | POA: Diagnosis not present

## 2018-06-06 DIAGNOSIS — Z789 Other specified health status: Secondary | ICD-10-CM | POA: Diagnosis not present

## 2018-06-06 DIAGNOSIS — I2510 Atherosclerotic heart disease of native coronary artery without angina pectoris: Secondary | ICD-10-CM | POA: Diagnosis not present

## 2018-06-06 DIAGNOSIS — S62101S Fracture of unspecified carpal bone, right wrist, sequela: Secondary | ICD-10-CM | POA: Diagnosis not present

## 2018-06-06 DIAGNOSIS — S52501S Unspecified fracture of the lower end of right radius, sequela: Secondary | ICD-10-CM | POA: Diagnosis not present

## 2018-06-06 DIAGNOSIS — S0181XS Laceration without foreign body of other part of head, sequela: Secondary | ICD-10-CM | POA: Diagnosis not present

## 2018-06-06 DIAGNOSIS — S52501D Unspecified fracture of the lower end of right radius, subsequent encounter for closed fracture with routine healing: Secondary | ICD-10-CM | POA: Diagnosis not present

## 2018-06-06 DIAGNOSIS — S52571A Other intraarticular fracture of lower end of right radius, initial encounter for closed fracture: Secondary | ICD-10-CM | POA: Diagnosis not present

## 2018-06-06 DIAGNOSIS — Z7901 Long term (current) use of anticoagulants: Secondary | ICD-10-CM | POA: Diagnosis not present

## 2018-06-06 MED ORDER — CLONAZEPAM 0.25 MG PO TBDP: 0.2500 mg | ORAL_TABLET | Freq: Two times a day (BID) | ORAL | 0 refills | Status: DC | PRN

## 2018-06-06 MED ORDER — LACOSAMIDE 100 MG PO TABS: 1.0000 | ORAL_TABLET | Freq: Two times a day (BID) | ORAL | 0 refills | Status: DC

## 2018-06-06 MED ORDER — TRAMADOL HCL 50 MG PO TABS: 25.0000 mg | ORAL_TABLET | Freq: Four times a day (QID) | ORAL | 0 refills | Status: DC | PRN

## 2018-06-06 NOTE — Telephone Encounter (Signed)
Rx sent to [facility] phone : [phone] , fax : [phone]  

## 2018-06-07 ENCOUNTER — Ambulatory Visit: Payer: Medicare Other | Admitting: Physical Medicine & Rehabilitation

## 2018-06-11 DIAGNOSIS — C711 Malignant neoplasm of frontal lobe: Secondary | ICD-10-CM | POA: Diagnosis not present

## 2018-06-12 ENCOUNTER — Other Ambulatory Visit: Payer: Self-pay | Admitting: Oncology

## 2018-06-12 DIAGNOSIS — C711 Malignant neoplasm of frontal lobe: Secondary | ICD-10-CM

## 2018-06-14 ENCOUNTER — Encounter: Payer: Self-pay | Admitting: Adult Health

## 2018-06-14 NOTE — Progress Notes (Signed)
Opened in error; Disregard.

## 2018-06-15 ENCOUNTER — Non-Acute Institutional Stay (SKILLED_NURSING_FACILITY): Payer: Medicare Other | Admitting: Adult Health

## 2018-06-15 ENCOUNTER — Encounter: Payer: Self-pay | Admitting: Adult Health

## 2018-06-15 DIAGNOSIS — F419 Anxiety disorder, unspecified: Secondary | ICD-10-CM | POA: Diagnosis not present

## 2018-06-15 DIAGNOSIS — C719 Malignant neoplasm of brain, unspecified: Secondary | ICD-10-CM | POA: Diagnosis not present

## 2018-06-15 DIAGNOSIS — F32A Depression, unspecified: Secondary | ICD-10-CM

## 2018-06-15 DIAGNOSIS — S62101S Fracture of unspecified carpal bone, right wrist, sequela: Secondary | ICD-10-CM

## 2018-06-15 DIAGNOSIS — K219 Gastro-esophageal reflux disease without esophagitis: Secondary | ICD-10-CM

## 2018-06-15 DIAGNOSIS — Z8673 Personal history of transient ischemic attack (TIA), and cerebral infarction without residual deficits: Secondary | ICD-10-CM | POA: Diagnosis not present

## 2018-06-15 DIAGNOSIS — I10 Essential (primary) hypertension: Secondary | ICD-10-CM

## 2018-06-15 DIAGNOSIS — G40909 Epilepsy, unspecified, not intractable, without status epilepticus: Secondary | ICD-10-CM | POA: Diagnosis not present

## 2018-06-15 DIAGNOSIS — F329 Major depressive disorder, single episode, unspecified: Secondary | ICD-10-CM | POA: Diagnosis not present

## 2018-06-15 DIAGNOSIS — I480 Paroxysmal atrial fibrillation: Secondary | ICD-10-CM

## 2018-06-15 NOTE — Progress Notes (Signed)
[facility]:  [facility] Room Number: 287O Place of Service:  SNF (31) Provider:  Durenda Age, NP  Patient Care Team: Glendon Axe, MD as PCP - General (Internal Medicine)  Extended Emergency Contact Information Primary Emergency Contact: Strutz,Philip  [place] Phone: [phone] Relation: Spouse  Code Status:  FULL  Goals of care: Advanced Directive information Advanced Directives 06/15/2018  Does Patient Have a Medical Advance Directive? No  Type of Advance Directive -  Does patient want to make changes to medical advance directive? -  Copy of [place] in Chart? -  Would patient like information on creating a medical advance directive? No - Patient declined     Chief Complaint  Patient presents with  . Medical Management of Chronic Issues    Weekly Followup for the first 30 days Post Hospitalization.    HPI:  Pt is a 77 y.o. female seen today for a routine visit. She was seen in the room today after a visit from a friend. She is not able to move her right lower extremity. She is currently having chemotherapy using Temodar capsule. She has a cast on her right wrist.   She has been admitted to [facility] on 06/06/18 from a recent hospitalization at [facility]. She reported to have fallen in her kitchen from a standing position and sustained a high grade gluteal sprain and right radial fracture. She had post seizures and stroke like symptoms. She was recently diagnosed with glioblastoma per her recent biopsy result.   Past Medical History:  Diagnosis Date  . A-fib ([place])   . Arthritis   . Breast cancer ([place]) 2002   RT LUMPECTOMY  . Bronchitis   . Carotid artery narrowings   . 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 Carotid artery occlusion    RIGHT 50% BLOCKAGE  . Colon polyps   . Complication of anesthesia    shaking/ FOLLOWING LOCAL AT DENTIST , drop O2 sats TO 50% DURING COLONOSCOPY    . Coronary artery disease   . Diverticulosis   . Dysrhythmia   . Environmental allergies   . GERD (gastroesophageal reflux disease)   . History of hiatal hernia   . Hyperlipidemia   . Hypertension   . IBS (irritable bowel syndrome)   . Mitral valve disorder   . Shortness of breath dyspnea   . Syncope and collapse   . UTI (lower urinary tract infection)    Past Surgical History:  Procedure Laterality Date  . APPENDECTOMY    . BREAST BIOPSY Right 2002   POS  . BREAST CYST ASPIRATION Right   . BREAST EXCISIONAL BIOPSY Left 1997   NEG  . BREAST LUMPECTOMY    . CATARACT EXTRACTION W/PHACO Right 06/07/2016   Procedure: CATARACT EXTRACTION PHACO AND INTRAOCULAR LENS PLACEMENT (IOC);  Surgeon: Birder Robson, MD;  Location: [facility] ORS;  Service: Ophthalmology;  Laterality: Right;  [place] 00:51 AP% 19.6 CDE 10.09 Fluid pack lot # [id number]  . CATARACT EXTRACTION W/PHACO Left 07/05/2016   Procedure: CATARACT EXTRACTION PHACO AND INTRAOCULAR LENS PLACEMENT (IOC);  Surgeon: Birder Robson, MD;  Location: [facility] ORS;  Service: Ophthalmology;  Laterality: Left;  [place] 00:38 AP% 24.9 CDE 9.60 Fluid Pack lot # [id number]  . CESAREAN SECTION    . TONSILLECTOMY    . TOTAL KNEE ARTHROPLASTY Left     Allergies  Allergen Reactions  . Nitroglycerin Other (See Comments)    Can only use the  patch  Turned lobster red and felt faint with sublingual NTG  . Penicillins Hives    Hives Has patient had a PCN reaction causing immediate rash, facial/tongue/throat swelling, SOB or lightheadedness with hypotension: YES Has patient had a PCN reaction causing severe rash involving mucus membranes or skin necrosis: NO Has patient had a PCN reaction that required hospitalization NO Has patient had a PCN reaction occurring within the last 10 years: No If all of the above answers are "NO", then may proceed with Cephalosporin use.  . Biaxin [Clarithromycin] Rash    Rash   . Latex Rash  . Lidocaine Swelling    Eye  swelling Tolerates Tetracaine with epidural steroid injections (04/10/17)  . Nickel Rash  . Oxycodone Hives  . Tape Rash  . Tetracyclines & Related Rash       . Tizanidine Rash    Outpatient Encounter Medications as of 06/15/2018  Medication Sig  . acetaminophen (TYLENOL) 325 MG tablet Take 975 mg by mouth every 6 (six) hours as needed.  Marland Kitchen amLODipine (NORVASC) 5 MG tablet Take 2.5 mg by mouth daily.  Marland Kitchen apixaban (ELIQUIS) 2.5 MG TABS tablet Take 2.5 mg by mouth 2 (two) times daily.  Marland Kitchen atorvastatin (LIPITOR) 40 MG tablet Take 40 mg by mouth daily.  . clonazePAM (KLONOPIN) 0.25 MG disintegrating tablet Take 0.25 mg by mouth 2 (two) times daily.  Marland Kitchen dexamethasone (DECADRON) 2 MG tablet Take 2 mg by mouth 2 (two) times daily with a meal.  . famotidine (PEPCID) 20 MG tablet Take 20 mg by mouth 2 (two) times daily as needed.  . fenofibrate (TRICOR) 145 MG tablet Take 145 mg by mouth daily.  . Infant Care Products [facility] EX) Apply liberal amount topically area of skin irritation prn. OK to leave at bedside  . Lacosamide (VIMPAT) 100 MG TABS Take 1 tablet (100 mg total) by mouth every 12 (twelve) hours.  . levETIRAcetam (KEPPRA) 750 MG tablet Take 1,500 mg by mouth 2 (two) times daily.  . magnesium oxide (MAG-OX) 400 MG tablet Take 400 mg by mouth 2 (two) times daily.  . metoprolol tartrate (LOPRESSOR) 25 MG tablet Take 0.5 tablets (12.5 mg total) by mouth 2 (two) times daily.  . mirtazapine (REMERON) 15 MG tablet Take 7.5 mg by mouth at bedtime. 1/2 tab  . Multiple Vitamin (MULTIVITAMIN WITH MINERALS) TABS tablet Take 1 tablet by mouth daily.  . naphazoline-pheniramine (NAPHCON-A) 0.025-0.3 % ophthalmic solution Place 1 drop into both eyes every 6 (six) hours as needed. relieve eye redness/allergies  . NON FORMULARY Diet Type: Regular  . ondansetron (ZOFRAN) 8 MG tablet Take 8 mg by mouth every 8 (eight) hours as needed for nausea.  . ondansetron (ZOFRAN) 8 MG tablet Take 8 mg by mouth at  bedtime. this medication has to administer 1 hour before the Temodar at night  . polyethylene glycol (MIRALAX / GLYCOLAX) packet Take 17 g by mouth daily.  Marland Kitchen temozolomide (TEMODAR) 250 MG capsule Take 250 mg by mouth at bedtime. Administer 1 1/2- 2 hours after supper chemo treatment for 5 nights *wear gloves* May take on an empty stomach or at bedtime to decrease nausea & vomiting.  . traMADol (ULTRAM) 50 MG tablet Take 0.5 tablets (25 mg total) by mouth every 6 (six) hours as needed. If pain not relieved by tylenol  . Wound Dressings (TRIAD HYDROPHILIC WOUND DRESS EX) Apply topically. Apply thin film topically to macerated buttocks 2 times a day  . [DISCONTINUED] clonazePAM (KLONOPIN) 0.25 MG  disintegrating tablet Take 1 tablet (0.25 mg total) by mouth 2 (two) times daily as needed.   No facility-administered encounter medications on file as of 06/15/2018.     Review of Systems  GENERAL: No fever or chills MOUTH and THROAT: Denies oral discomfort, gingival pain or bleeding RESPIRATORY: no cough, SOB, DOE, wheezing, hemoptysis CARDIAC: No chest pain, edema or palpitations GI: No abdominal pain, diarrhea, constipation, heart burn, nausea or vomiting GU: Denies dysuria, frequency, hematuria, incontinence, or discharge PSYCHIATRIC: Denies feelings of depression or anxiety. No report of hallucinations, insomnia, paranoia, or agitation   Immunization History  Administered Date(s) Administered  . Hepatitis B, adult 05/19/2016  . Influenza, High Dose Seasonal PF 08/19/2016, 07/11/2017  . Pneumococcal Conjugate-13 08/03/2017  . Pneumococcal Polysaccharide-23 05/19/2016  . Tdap 03/07/2016  . Zoster Recombinat (Shingrix) 07/03/2017, 12/28/2017   Pertinent  Health Maintenance Due  Topic Date Due  . INFLUENZA VACCINE  05/03/2018  . DEXA SCAN  Completed  . PNA vac Low Risk Adult  Completed   Fall Risk  05/07/2018 06/06/2017 11/25/2016  Falls in the past year? Yes No No  Number falls in past yr: 1  - -  Injury with Fall? No - -  Risk for fall due to : Other (Comment) - -  Risk for fall due to: Comment bathroom accident - -  Follow up Falls prevention discussed - -       Vitals:   06/15/18 1339  BP: 126/70  Pulse: 78  Resp: 18  Temp: 97.8 F (36.6 C)  TempSrc: Oral  SpO2: 95%  Weight: 162 lb (73.5 kg)  Height: 5\' 4"  (1.626 m)   Body mass index is 27.81 kg/m.  Physical Exam  GENERAL APPEARANCE: Well nourished. In no acute distress. Normal body habitus SKIN:  Skin is warm and dry.  MOUTH and THROAT: Lips are without lesions. Oral mucosa is moist and without lesions. Tongue is normal in shape, size, and color and without lesions RESPIRATORY: Breathing is even & unlabored, BS CTAB CARDIAC: RRR, no murmur,no extra heart sounds, no edema GI: Abdomen soft, normal BS, no masses, no tenderness EXTREMITIES:  Unable to move right lower extremity, right wrist has hard cast PSYCHIATRIC: Alert and oriented X 3. Affect and behavior are appropriate   Labs reviewed: Recent Labs    04/20/18 0527 04/26/18 0452 05/20/18 1703  NA 143 142 140  K 3.5 3.9 3.6  CL 109 108 107  CO2 26 28 25   GLUCOSE 116* 98 96  BUN 5* 20 10  CREATININE 0.72 0.73 0.66  CALCIUM 9.3 9.4 9.8   Recent Labs    04/16/18 1932 04/20/18 0527  AST 41 30  ALT 21 18  ALKPHOS 46 32*  BILITOT 1.2 1.1  PROT 6.5 5.3*  ALBUMIN 4.3 3.3*   Recent Labs    04/16/18 1932  04/20/18 0527 04/26/18 0452 05/20/18 1703  WBC 5.9   < > 4.2 4.3 6.8  NEUTROABS 2.4  --  1.4*  --  2.4  HGB 12.6   < > 11.7* 11.4* 13.6  HCT 35.5   < > 35.7* 34.3* 39.1  MCV 99.8   < > 102.9* 103.3* 97.8  PLT 127*   < > 110* 128* 128*   < > = values in this interval not displayed.   Lab Results  Component Value Date   TSH 1.90 06/06/2017   Lab Results  Component Value Date   HGBA1C 5.0 04/21/2018   Lab Results  Component Value Date  CHOL 104 04/18/2018   HDL 15 (L) 04/18/2018   LDLCALC 60 04/18/2018   TRIG 144  04/18/2018   CHOLHDL 6.9 04/18/2018    Significant Diagnostic Results in last 30 days:  Ct Head Wo Contrast  Result Date: 05/20/2018 CLINICAL DATA:  Fall with laceration to the right temporal area, headache EXAM: CT HEAD WITHOUT CONTRAST CT CERVICAL SPINE WITHOUT CONTRAST TECHNIQUE: Multidetector CT imaging of the head and cervical spine was performed following the standard protocol without intravenous contrast. Multiplanar CT image reconstructions of the cervical spine were also generated. COMPARISON:  MRI 04/17/2018, CT brain 04/16/2018 FINDINGS: CT HEAD FINDINGS Brain: No hemorrhage is visualized. Hypodensity within the left posterior frontal lobe near the cranial vertex. No midline shift. Atrophy. Mild small vessel ischemic changes of the white matter. Stable ventricle size. Vascular: No hyperdense vessels.  Carotid vascular calcification. Skull: No fracture Sinuses/Orbits: No acute finding. Other: None CT CERVICAL SPINE FINDINGS Alignment: No subluxation.  Facet alignment is within normal limits. Skull base and vertebrae: No acute fracture. No primary bone lesion or focal pathologic process. Soft tissues and spinal canal: No prevertebral fluid or swelling. No visible canal hematoma. Disc levels: Marked degenerative changes C3-C4, C5-C6 and C6-C7 with moderate degenerative change at C4-C5. Multiple level hypertrophic facet degenerative change with multiple level foraminal stenosis. Prominent degenerative change at C1-C2 articulation. Upper chest: Negative. Other: None IMPRESSION: 1. Negative for acute intracranial hemorrhage. 2. Hypodensity within the left posterior frontal lobe near the vertex. Not certain if this represents evolution of known infarct in the region or possible parenchymal mass lesion; an enhancing lesion was noted in the region on 04/17/2018 MRI. Short interval 4-6 week MRI follow-up was recommended for this finding. 3. Atrophy and mild small vessel ischemic changes of the white matter.  4. Degenerative changes of the cervical spine. No acute osseous abnormality. Electronically Signed   By: Donavan Foil M.D.   On: 05/20/2018 18:22   Ct Cervical Spine Wo Contrast  Result Date: 05/20/2018 CLINICAL DATA:  Fall with laceration to the right temporal area, headache EXAM: CT HEAD WITHOUT CONTRAST CT CERVICAL SPINE WITHOUT CONTRAST TECHNIQUE: Multidetector CT imaging of the head and cervical spine was performed following the standard protocol without intravenous contrast. Multiplanar CT image reconstructions of the cervical spine were also generated. COMPARISON:  MRI 04/17/2018, CT brain 04/16/2018 FINDINGS: CT HEAD FINDINGS Brain: No hemorrhage is visualized. Hypodensity within the left posterior frontal lobe near the cranial vertex. No midline shift. Atrophy. Mild small vessel ischemic changes of the white matter. Stable ventricle size. Vascular: No hyperdense vessels.  Carotid vascular calcification. Skull: No fracture Sinuses/Orbits: No acute finding. Other: None CT CERVICAL SPINE FINDINGS Alignment: No subluxation.  Facet alignment is within normal limits. Skull base and vertebrae: No acute fracture. No primary bone lesion or focal pathologic process. Soft tissues and spinal canal: No prevertebral fluid or swelling. No visible canal hematoma. Disc levels: Marked degenerative changes C3-C4, C5-C6 and C6-C7 with moderate degenerative change at C4-C5. Multiple level hypertrophic facet degenerative change with multiple level foraminal stenosis. Prominent degenerative change at C1-C2 articulation. Upper chest: Negative. Other: None IMPRESSION: 1. Negative for acute intracranial hemorrhage. 2. Hypodensity within the left posterior frontal lobe near the vertex. Not certain if this represents evolution of known infarct in the region or possible parenchymal mass lesion; an enhancing lesion was noted in the region on 04/17/2018 MRI. Short interval 4-6 week MRI follow-up was recommended for this finding. 3.  Atrophy and mild small vessel ischemic changes  of the white matter. 4. Degenerative changes of the cervical spine. No acute osseous abnormality. Electronically Signed   By: Donavan Foil M.D.   On: 05/20/2018 18:22   Ct Pelvis Wo Contrast  Result Date: 05/20/2018 CLINICAL DATA:  Status post fall with right hip/groin pain. EXAM: CT PELVIS WITHOUT CONTRAST TECHNIQUE: Multidetector CT imaging of the pelvis was performed following the standard protocol without intravenous contrast. COMPARISON:  None. FINDINGS: Urinary Tract:  No abnormality visualized. Bowel:  Unremarkable visualized pelvic bowel loops. Vascular/Lymphatic: No pathologically enlarged lymph nodes. Aortic atherosclerotic disease. Reproductive:  Leiomyomatous uterus. Other:  None. Musculoskeletal: No evidence of acute fracture. 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 IMPRESSION: No evidence of acute fracture. 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 Electronically Signed   By: Fidela Salisbury M.D.   On: 05/20/2018 21:08   Mr Brain W And Wo Contrast  Result Date: 05/20/2018 CLINICAL DATA:  Initial evaluation for trauma acute trauma, fall. Question neoplasm versus evolving infarct at posterior left frontal region. EXAM: MRI HEAD WITHOUT AND WITH CONTRAST TECHNIQUE: Multiplanar, multiecho pulse sequences of the brain and surrounding structures were obtained without and with intravenous contrast. CONTRAST:  73mL MULTIHANCE GADOBENATE DIMEGLUMINE 529 MG/ML IV SOLN COMPARISON:  Comparison made with prior CT from earlier the same day as well as recent MRI from 04/17/2018. FINDINGS: Brain: Cerebral volume stable, and within normal limits. Mild scattered nonspecific cerebral white matter changes, most like related chronic small vessel ischemic disease. Heterogeneous mass measuring 1.7 x 1.8 x 1.6 cm present within the parasagittal left frontoparietal  region (series 16, image 123). Adjacent but separate enhancing lesion positioned slightly inferiorly measures 2.3 x 1.0 x 1.4 cm (series 16, image 105). Small focus of intervening nodular enhancement noted (series 17, image 20). Surrounding T2/FLAIR signal abnormality consistent with associated vasogenic edema. Mild mass effect on the adjacent posterior left lateral ventricle. No significant regional mass effect or midline shift. Superimposed areas of susceptibility artifact likely reflect necrosis and/or hemorrhage. Findings have progressed from prior MRI, and are consistent with mass lesion/neoplasm. No other mass lesion or abnormal intracranial enhancement. No evidence for acute or subacute infarct. Gray-white matter differentiation otherwise maintained. Susceptibility artifacts seen extending along the right central sulcus (series 12, image 34). Findings indeterminate, but suspicious for occult subarachnoid hemorrhage, suspected to be related to recent trauma/fall, although intrinsic hemorrhage from the contralateral tumor could also be considered. No other evidence for acute intracranial hemorrhage. No other mass lesion. No midline shift. No hydrocephalus. Basilar cisterns are patent. No extra-axial fluid collection. Normal pituitary gland. Vascular: Major intracranial vascular flow voids are maintained. Skull and upper cervical spine: Craniocervical junction normal. Bone marrow signal intensity within normal limits. Small right frontal scalp contusion noted. Sinuses/Orbits: Globes orbital soft tissues within normal limits. Patient status post ocular lens replacement bilaterally. Paranasal sinuses are clear. No mastoid effusion. Inner ear structures normal. Other: None. IMPRESSION: 1. Two adjacent enhancing lesions measuring up to 2.3 cm positioned at the parasagittal left frontoparietal region as above, consistent with mass/neoplasm. Localized multifocal primary CNS neoplasm/GBM is favored, although localized  intracranial metastases could also be considered. Localized vasogenic edema without significant regional mass effect or midline shift. 2. Small volume susceptibility artifact layering along the right central sulcus, suspicious for occult subarachnoid hemorrhage. Findings suspected to be related to recent fall. 3. Small right frontal scalp contusion. 4. Mild nonspecific cerebral white matter changes. Critical Value/emergent results were called by telephone  at the time of interpretation on 05/20/2018 at 10:55 pm to Dr. Hinda Kehr , who verbally acknowledged these results. Electronically Signed   By: Jeannine Boga M.D.   On: 05/20/2018 22:55   Dg Chest Portable 1 View  Result Date: 05/20/2018 CLINICAL DATA:  Pain following fall EXAM: PORTABLE CHEST 1 VIEW COMPARISON:  Chest CT June 13, 2017 FINDINGS: There is no edema or consolidation. Heart size and pulmonary vascularity are normal. No adenopathy. There is aortic atherosclerosis. There are surgical clips in the right axilla. There is no evident pneumothorax. IMPRESSION: No edema or consolidation. No pneumothorax. There is aortic atherosclerosis. Aortic Atherosclerosis (ICD10-I70.0). Electronically Signed   By: Lowella Grip III M.D.   On: 05/20/2018 19:43   Dg Hand Complete Right  Result Date: 05/20/2018 CLINICAL DATA:  Pain following fall EXAM: RIGHT HAND - COMPLETE 3+ VIEW COMPARISON:  None. FINDINGS: Frontal, oblique, and lateral views were obtained. There is a fracture of the distal radius which parallels the cortex, with extension into the radiocarpal joint. No other fractures. No dislocation. There is subluxation at the second, third, and fourth MCP joints. There is moderate osteoarthritic change at all MCP, PIP, and DIP joints. There is also moderate osteoarthritic change in the first carpal-metacarpal and scaphotrapezial joints. No erosive changes. There is calcification in the triangular fibrocartilage region. IMPRESSION: 1.  Nondisplaced fracture distal radius which parallels the cortex. Fracture extends to the radiocarpal joint. 2.  No other fracture evident.  No dislocation. 3. Multifocal osteoarthritic change. Subluxation at the second, third, and fourth PIP joints is felt to be due to arthropathic etiology. 4. Suspect chronic tear of the triangular fibrocartilage with calcification in this area. Electronically Signed   By: Lowella Grip III M.D.   On: 05/20/2018 19:42   Dg Hip Unilat W Or Wo Pelvis 2-3 Views Right  Result Date: 05/20/2018 CLINICAL DATA:  77 year old female status post fall today with hip pain. EXAM: DG HIP (WITH OR WITHOUT PELVIS) 2-3V RIGHT COMPARISON:  Pelvis MRI 02/17/2017. FINDINGS: Femoral heads are normally located. Hip joint spaces appear symmetric and within normal limits. No pelvis fracture identified. Grossly intact proximal left femur. The proximal right femur appears intact on AP and cross-table lateral views. Partially visible levoconvex degenerative appearing lumbar scoliosis. Negative visible bowel gas pattern. Pelvic phleboliths. IMPRESSION: No acute fracture or dislocation identified about the right hip or pelvis. Electronically Signed   By: Genevie Ann M.D.   On: 05/20/2018 18:41    Assessment/Plan  1. Glioblastoma ([place]) -  continue Temodar 250 mg 1 capsule daily at bedtimedexamethasone 2 mg 1 tabevery 12 hours   2. Essential hypertension - well-controlled, continue metoprolol tartrate 25 mg 1/2 tab = 12.5 mg twice a day, norvasc 5 mg 1/2 tab = 2.5 mg daily   3. PAF (paroxysmal atrial fibrillation) (H[facility]) - continue Eliquis 2.5 mg 1 tab every 12 hours, metoprolol tartrate 25 mg 1/2 tab = 12.5 mg twice a day   4. History of CVA (cerebrovascular accident) - stable, continue Eliquis 2.5 mg 1 tab twice a day,TtriCor 145 mg 1 table daily,   metoprolol tartrate 25 mg 1/2 tab = 12.5 mg twice a day, norvasc 5 mg 1/2 tab = 2.5 mg daily   5. Seizure disorder ([place]) - continue Keppra 750 mg  2 tabs every 12 hours   6. Closed fracture of right wrist, sequela - has hard cast, follow-up with orthopedics   7. Chronic depression - mood this is stable, continue mirtazapine 15  mg 1/2 tab = 7.5 mg daily at bedtime   8. Gastroesophageal reflux disease, esophagitis presence not specified - stable, continue Pepcid 20 mg 1 tab twice a day   9. Hypomagnesemia - continue magnesium oxide 400 mg 1 tab twice a day   10. Anxiety - mood is stable,ontinue clonazepam0.25 mg 1 tab twice a day    Family/ staff Communication: Discussed plan of care with patient.  Labs/tests ordered:  None  Goals of care:  [place], NP [facility] [phone] (Monday-Friday 8:00 a.m. - 5:00 p.m.) [phone] (after hours)

## 2018-06-18 ENCOUNTER — Other Ambulatory Visit: Payer: Self-pay

## 2018-06-18 DIAGNOSIS — S52571A Other intraarticular fracture of lower end of right radius, initial encounter for closed fracture: Secondary | ICD-10-CM | POA: Diagnosis not present

## 2018-06-18 DIAGNOSIS — S52501D Unspecified fracture of the lower end of right radius, subsequent encounter for closed fracture with routine healing: Secondary | ICD-10-CM | POA: Diagnosis not present

## 2018-06-18 DIAGNOSIS — Y33XXXD Other specified events, undetermined intent, subsequent encounter: Secondary | ICD-10-CM | POA: Diagnosis not present

## 2018-06-18 MED ORDER — CLONAZEPAM 0.25 MG PO TBDP: 0.2500 mg | ORAL_TABLET | Freq: Two times a day (BID) | ORAL | 0 refills | Status: DC

## 2018-06-18 NOTE — Telephone Encounter (Signed)
Rx sent to [facility] phone : [phone] , fax : [phone]  

## 2018-06-26 ENCOUNTER — Ambulatory Visit: Payer: Medicare Other | Admitting: Cardiovascular Disease

## 2018-06-27 ENCOUNTER — Non-Acute Institutional Stay (SKILLED_NURSING_FACILITY): Payer: Medicare Other | Admitting: Adult Health

## 2018-06-27 ENCOUNTER — Encounter: Payer: Self-pay | Admitting: Adult Health

## 2018-06-27 DIAGNOSIS — C713 Malignant neoplasm of parietal lobe: Secondary | ICD-10-CM | POA: Diagnosis not present

## 2018-06-27 DIAGNOSIS — I639 Cerebral infarction, unspecified: Secondary | ICD-10-CM

## 2018-06-27 DIAGNOSIS — F419 Anxiety disorder, unspecified: Secondary | ICD-10-CM | POA: Diagnosis not present

## 2018-06-27 DIAGNOSIS — G8191 Hemiplegia, unspecified affecting right dominant side: Secondary | ICD-10-CM | POA: Diagnosis not present

## 2018-06-27 DIAGNOSIS — G40109 Localization-related (focal) (partial) symptomatic epilepsy and epileptic syndromes with simple partial seizures, not intractable, without status epilepticus: Secondary | ICD-10-CM

## 2018-06-27 DIAGNOSIS — S52501S Unspecified fracture of the lower end of right radius, sequela: Secondary | ICD-10-CM | POA: Diagnosis not present

## 2018-06-27 DIAGNOSIS — I10 Essential (primary) hypertension: Secondary | ICD-10-CM | POA: Diagnosis not present

## 2018-06-27 DIAGNOSIS — I482 Chronic atrial fibrillation, unspecified: Secondary | ICD-10-CM

## 2018-06-27 DIAGNOSIS — E785 Hyperlipidemia, unspecified: Secondary | ICD-10-CM

## 2018-06-27 DIAGNOSIS — K5909 Other constipation: Secondary | ICD-10-CM

## 2018-06-27 NOTE — Progress Notes (Signed)
[facility]   [facility] Room Number: [place] of Service:  SNF (31)   CODE STATUS: Full Code  Allergies  Allergen Reactions  . Nitroglycerin Other (See Comments)    Can only use the patch  Turned lobster red and felt faint with sublingual NTG  . Penicillins Hives    Hives Has patient had a PCN reaction causing immediate rash, facial/tongue/throat swelling, SOB or lightheadedness with hypotension: YES Has patient had a PCN reaction causing severe rash involving mucus membranes or skin necrosis: NO Has patient had a PCN reaction that required hospitalization NO Has patient had a PCN reaction occurring within the last 10 years: No If all of the above answers are "NO", then may proceed with Cephalosporin use.  . Biaxin [Clarithromycin] Rash    Rash   . Latex Rash  . Lidocaine Swelling    Eye swelling Tolerates Tetracaine with epidural steroid injections (04/10/17)  . Nickel Rash  . Oxycodone Hives  . Tape Rash  . Tetracyclines & Related Rash       . Tizanidine Rash    Chief Complaint  Patient presents with  . Medical Management of Chronic Issues    Hypertension; afib; stroke. Weekly follow up for the first 30 days post hospitalization     HPI:  She is a 77 year old short term rehab patient being seen for the management of her chronic illnesses: hypertension; afib stroke. She denies any uncontrolled pain; no worsening weakness; no changes in appetite.   Past Medical History:  Diagnosis Date  . A-fib ([place])   . Arthritis   . Breast cancer ([place]) 2002   RT LUMPECTOMY  . Bronchitis   . Carotid artery narrowings   . 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 Carotid artery occlusion    RIGHT 50% BLOCKAGE  . Colon polyps   . Complication of anesthesia    shaking/ FOLLOWING LOCAL AT DENTIST , drop O2 sats TO 50% DURING COLONOSCOPY  . Coronary artery disease   . Diverticulosis   . Dysrhythmia   . Environmental allergies   . GERD  (gastroesophageal reflux disease)   . History of hiatal hernia   . Hyperlipidemia   . Hypertension   . IBS (irritable bowel syndrome)   . Mitral valve disorder   . Shortness of breath dyspnea   . Syncope and collapse   . UTI (lower urinary tract infection)     Past Surgical History:  Procedure Laterality Date  . APPENDECTOMY    . BREAST BIOPSY Right 2002   POS  . BREAST CYST ASPIRATION Right   . BREAST EXCISIONAL BIOPSY Left 1997   NEG  . BREAST LUMPECTOMY    . CATARACT EXTRACTION W/PHACO Right 06/07/2016   Procedure: CATARACT EXTRACTION PHACO AND INTRAOCULAR LENS PLACEMENT (IOC);  Surgeon: Birder Robson, MD;  Location: [facility] ORS;  Service: Ophthalmology;  Laterality: Right;  [place] 00:51 AP% 19.6 CDE 10.09 Fluid pack lot # [id number]  . CATARACT EXTRACTION W/PHACO Left 07/05/2016   Procedure: CATARACT EXTRACTION PHACO AND INTRAOCULAR LENS PLACEMENT (IOC);  Surgeon: Birder Robson, MD;  Location: [facility] ORS;  Service: Ophthalmology;  Laterality: Left;  [place] 00:38 AP% 24.9 CDE 9.60 Fluid Pack lot # [id number]  . CESAREAN SECTION    . TONSILLECTOMY    . TOTAL KNEE ARTHROPLASTY Left     Social History   Socioeconomic History  . Marital status: Married    Spouse name: Not on file  .  Number of children: Not on file  . Years of education: Not on file  . Highest education level: Not on file  Occupational History  . Occupation: retired    Comment: Nurse, adult  . Financial resource strain: Not on file  . Food insecurity:    Worry: Not on file    Inability: Not on file  . Transportation needs:    Medical: Not on file    Non-medical: Not on file  Tobacco Use  . Smoking status: Former Smoker    Years: 10.00    Types: Cigarettes    Last attempt to quit: 10/03/1969    Years since quitting: 48.7  . Smokeless tobacco: Never Used  Substance and Sexual Activity  . Alcohol use: Yes    Alcohol/week: 0.0 standard drinks    Comment: couple drinks/week  . Drug use: No  .  Sexual activity: Not on file  Lifestyle  . Physical activity:    Days per week: Not on file    Minutes per session: Not on file  . Stress: Not on file  Relationships  . Social connections:    Talks on phone: Not on file    Gets together: Not on file    Attends religious service: Not on file    Active member of club or organization: Not on file    Attends meetings of clubs or organizations: Not on file    Relationship status: Not on file  . Intimate partner violence:    Fear of current or ex partner: Not on file    Emotionally abused: Not on file    Physically abused: Not on file    Forced sexual activity: Not on file  Other Topics Concern  . Not on file  Social History Narrative  . Not on file   Family History  Problem Relation Age of Onset  . Lung cancer Father   . Prostate cancer Father   . Hyperlipidemia Brother   . Hypertension Brother   . Obesity Brother   . Heart disease Mother   . Lung cancer Sister   . Kidney disease Unknown        Maternal grandparent  . Breast cancer Neg Hx       VITAL SIGNS BP 126/70   Pulse 78   Temp 97.8 F (36.6 C)   Resp 18   Ht 5\' 4"  (1.626 m)   Wt 160 lb (72.6 kg)   SpO2 95%   BMI 27.46 kg/m   Outpatient Encounter Medications as of 06/27/2018  Medication Sig  . acetaminophen (TYLENOL) 325 MG tablet Take 975 mg by mouth every 6 (six) hours as needed.  Marland Kitchen amLODipine (NORVASC) 5 MG tablet Take 2.5 mg by mouth daily.  Marland Kitchen apixaban (ELIQUIS) 2.5 MG TABS tablet Take 2.5 mg by mouth 2 (two) times daily.  Marland Kitchen atorvastatin (LIPITOR) 40 MG tablet Take 40 mg by mouth daily.  . clonazePAM (KLONOPIN) 0.25 MG disintegrating tablet Take 1 tablet (0.25 mg total) by mouth 2 (two) times daily.  Marland Kitchen dexamethasone (DECADRON) 2 MG tablet Take 2 mg by mouth 2 (two) times daily with a meal.  . famotidine (PEPCID) 20 MG tablet Take 20 mg by mouth 2 (two) times daily as needed.  . fenofibrate (TRICOR) 145 MG tablet Take 145 mg by mouth daily.  .  Lacosamide (VIMPAT) 100 MG TABS Take 1 tablet (100 mg total) by mouth every 12 (twelve) hours.  . levETIRAcetam (KEPPRA) 750 MG tablet Take 1,500  mg by mouth 2 (two) times daily.  . magnesium oxide (MAG-OX) 400 MG tablet Take 400 mg by mouth 2 (two) times daily.  . metoprolol tartrate (LOPRESSOR) 25 MG tablet Take 0.5 tablets (12.5 mg total) by mouth 2 (two) times daily.  . mirtazapine (REMERON) 15 MG tablet Take 7.5 mg by mouth at bedtime. 1/2 tab  . Multiple Vitamin (MULTIVITAMIN WITH MINERALS) TABS tablet Take 1 tablet by mouth daily.  . naphazoline-pheniramine (NAPHCON-A) 0.025-0.3 % ophthalmic solution Place 1 drop into both eyes every 6 (six) hours as needed. relieve eye redness/allergies  . NON FORMULARY Diet Type: Regular  . ondansetron (ZOFRAN) 8 MG tablet Take 8 mg by mouth every 8 (eight) hours as needed for nausea.  . polyethylene glycol (MIRALAX / GLYCOLAX) packet Take 17 g by mouth daily.  . traMADol (ULTRAM) 50 MG tablet Take 0.5 tablets (25 mg total) by mouth every 6 (six) hours as needed. If pain not relieved by tylenol   No facility-administered encounter medications on file as of 06/27/2018.      SIGNIFICANT DIAGNOSTIC EXAMS  TODAY:   06-06-18: wbc 7.0; hgb 13.6; hc6 39.5; mcv 96; plt 146 glucose 131; bun 13; creat 0.6; k+ 4.1; na++ 140; ca 9.5   Review of Systems  Constitutional: Negative for malaise/fatigue.  Respiratory: Negative for cough and shortness of breath.   Cardiovascular: Negative for chest pain, palpitations and leg swelling.  Gastrointestinal: Negative for abdominal pain, constipation and heartburn.  Musculoskeletal: Negative for back pain, joint pain and myalgias.  Skin: Negative.   Neurological: Negative for dizziness.  Psychiatric/Behavioral: The patient is not nervous/anxious.     Physical Exam  Constitutional: She is oriented to person, place, and time. She appears well-developed and well-nourished. No distress.  Neck: No thyromegaly present.    Cardiovascular: Normal rate, regular rhythm, normal heart sounds and intact distal pulses.  Pulmonary/Chest: Effort normal and breath sounds normal. No respiratory distress.  Abdominal: Soft. Bowel sounds are normal. She exhibits no distension. There is no tenderness.  Musculoskeletal: She exhibits no edema.  Right hemiparesis.  Splint right wrist and right lower leg   Lymphadenopathy:    She has no cervical adenopathy.  Neurological: She is alert and oriented to person, place, and time.  Skin: Skin is warm and dry. She is not diaphoretic.  Psychiatric: She has a normal mood and affect.    ASSESSMENT/ PLAN:  TODAY:   1. Essential hypertension: is stable b/p 126/70: will continue norvasc 2.5 mg daily lopressor 12.5 mg twice daily   2. Chronic atrial fibrillation: heart rate is stable: will continue eliquis 2.5 mg twice daily   3. Ischemic stroke of frontal lobe: is neurologically stable; has right hemiparesis: will continue eliquis 2.5 mg twice daily   4.  Hyperlipidemia: is stable will continue lipitor 40 mg daily and tricor 145 mg daily  5. 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 Right radius fracture: is stable will continue to monitor and will follow up with orthopedics  7. Chronic constipation: is stable will continue miralax daily   8. Anxiety: is stable klonopin 0.25 mg twice daily and is taking remeron 7.5 mg nightly   9. 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 Is followed by oncology        MD is aware  of resident's narcotic use and is in agreement with current plan of care. We will attempt to wean resident as apropriate   Ok Edwards NP [facility]  Contact [phone] Monday through Friday 8am- 5pm  After hours call [phone]

## 2018-06-28 ENCOUNTER — Telehealth: Payer: Self-pay | Admitting: Internal Medicine

## 2018-06-28 NOTE — Telephone Encounter (Signed)
Patient in rehab and has a brain tumor.  Per Spouse she may not need appt will call if needed . Deleting recall.

## 2018-07-03 ENCOUNTER — Other Ambulatory Visit
Admission: RE | Admit: 2018-07-03 | Discharge: 2018-07-03 | Disposition: A | Discharge status: Home / Self Care | Payer: Medicare Other | Source: Ambulatory Visit | Attending: Internal Medicine | Admitting: Internal Medicine

## 2018-07-03 ENCOUNTER — Encounter
Admission: RE | Admit: 2018-07-03 | Discharge: 2018-07-03 | Disposition: A | Discharge status: Home / Self Care | Payer: Medicare Other | Source: Ambulatory Visit | Attending: Internal Medicine | Admitting: Internal Medicine

## 2018-07-03 DIAGNOSIS — Z9221 Personal history of antineoplastic chemotherapy: Secondary | ICD-10-CM | POA: Insufficient documentation

## 2018-07-03 DIAGNOSIS — C711 Malignant neoplasm of frontal lobe: Secondary | ICD-10-CM | POA: Insufficient documentation

## 2018-07-03 LAB — COMPREHENSIVE METABOLIC PANEL
ALK PHOS: 52 U/L (ref 38–126)
ALT: 44 U/L (ref 0–44)
AST: 30 U/L (ref 15–41)
Albumin: 3.4 g/dL — ABNORMAL LOW (ref 3.5–5.0)
Anion gap: 8 (ref 5–15)
BILIRUBIN TOTAL: 0.9 mg/dL (ref 0.3–1.2)
BUN: 22 mg/dL (ref 8–23)
CALCIUM: 8.9 mg/dL (ref 8.9–10.3)
CO2: 27 mmol/L (ref 22–32)
CREATININE: 0.52 mg/dL (ref 0.44–1.00)
Chloride: 102 mmol/L (ref 98–111)
GFR calc Af Amer: >60 mL/min (ref >60)
GFR calc non Af Amer: >60 mL/min (ref >60)
GLUCOSE: 108 mg/dL — AB (ref 70–99)
Potassium: 4.2 mmol/L (ref 3.5–5.1)
SODIUM: 137 mmol/L (ref 135–145)
TOTAL PROTEIN: 5.5 g/dL — AB (ref 6.5–8.1)

## 2018-07-03 LAB — CBC WITH DIFFERENTIAL/PLATELET
Basophils Absolute: 0 10*3/uL (ref 0–0.1)
Basophils Relative: 1 %
EOS ABS: 0 10*3/uL (ref 0–0.7)
Eosinophils Relative: 0 %
HEMATOCRIT: 39.6 % (ref 35.0–47.0)
HEMOGLOBIN: 13.8 g/dL (ref 12.0–16.0)
Lymphocytes Relative: 23 %
Lymphs Abs: 1.1 10*3/uL (ref 1.0–3.6)
MCH: 35.2 pg — ABNORMAL HIGH (ref 26.0–34.0)
MCHC: 34.8 g/dL (ref 32.0–36.0)
MCV: 101.3 fL — ABNORMAL HIGH (ref 80.0–100.0)
MONOS PCT: 8 %
Monocytes Absolute: 0.4 10*3/uL (ref 0.2–0.9)
NEUTROS ABS: 3.3 10*3/uL (ref 1.4–6.5)
NEUTROS PCT: 68 %
Platelets: 46 10*3/uL — ABNORMAL LOW (ref 150–440)
RBC: 3.91 MIL/uL (ref 3.80–5.20)
RDW: 14.3 % (ref 11.5–14.5)
WBC: 4.9 10*3/uL (ref 3.6–11.0)

## 2018-07-05 ENCOUNTER — Ambulatory Visit
Admission: RE | Admit: 2018-07-05 | Discharge: 2018-07-05 | Disposition: A | Discharge status: Home / Self Care | Payer: Medicare Other | Source: Ambulatory Visit | Attending: Oncology | Admitting: Oncology

## 2018-07-05 ENCOUNTER — Non-Acute Institutional Stay (SKILLED_NURSING_FACILITY): Payer: Medicare Other | Admitting: Adult Health

## 2018-07-05 ENCOUNTER — Encounter: Payer: Self-pay | Admitting: Adult Health

## 2018-07-05 DIAGNOSIS — C711 Malignant neoplasm of frontal lobe: Secondary | ICD-10-CM | POA: Diagnosis not present

## 2018-07-05 DIAGNOSIS — C719 Malignant neoplasm of brain, unspecified: Secondary | ICD-10-CM | POA: Diagnosis not present

## 2018-07-05 DIAGNOSIS — C713 Malignant neoplasm of parietal lobe: Secondary | ICD-10-CM | POA: Diagnosis not present

## 2018-07-05 DIAGNOSIS — E785 Hyperlipidemia, unspecified: Secondary | ICD-10-CM | POA: Diagnosis not present

## 2018-07-05 DIAGNOSIS — I10 Essential (primary) hypertension: Secondary | ICD-10-CM

## 2018-07-05 DIAGNOSIS — I639 Cerebral infarction, unspecified: Secondary | ICD-10-CM

## 2018-07-05 DIAGNOSIS — I480 Paroxysmal atrial fibrillation: Secondary | ICD-10-CM

## 2018-07-05 MED ORDER — GADOBENATE DIMEGLUMINE 529 MG/ML IV SOLN
15.0000 mL | Freq: Once | INTRAVENOUS | Status: AC | PRN
  Administered 2018-07-05: 14 mL via INTRAVENOUS

## 2018-07-05 NOTE — Progress Notes (Signed)
[facility]:   [facility] Room Number: [place] of Service:  SNF (31)   CODE STATUS: Full Code  Allergies  Allergen Reactions  . Nitroglycerin Other (See Comments)    Can only use the patch  Turned lobster red and felt faint with sublingual NTG  . Penicillins Hives    Hives Has patient had a PCN reaction causing immediate rash, facial/tongue/throat swelling, SOB or lightheadedness with hypotension: YES Has patient had a PCN reaction causing severe rash involving mucus membranes or skin necrosis: NO Has patient had a PCN reaction that required hospitalization NO Has patient had a PCN reaction occurring within the last 10 years: No If all of the above answers are "NO", then may proceed with Cephalosporin use.  . Biaxin [Clarithromycin] Rash    Rash   . Latex Rash  . Lidocaine Swelling    Eye swelling Tolerates Tetracaine with epidural steroid injections (04/10/17)  . Nickel Rash  . Oxycodone Hives  . Tape Rash  . Tetracyclines & Related Rash       . Tizanidine Rash    Chief Complaint  Patient presents with  . Medical Management of Chronic Issues    Ischemic stroke; paf; hypertension; hyperlipidemia.     HPI:  She is a 77 year old short term rehab patient being seen for the management of her chronic illnesses; ischemic stroke; paf; hypertension; hyperlipidemia. She denies any chest pain or palpitations. She denies any uncontrolled pain.   Past Medical History:  Diagnosis Date  . A-fib ([place])   . Arthritis   . Breast cancer ([place]) 2002   RT LUMPECTOMY  . Bronchitis   . Carotid artery narrowings   . 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 Carotid artery occlusion    RIGHT 50% BLOCKAGE  . Colon polyps   . Complication of anesthesia    shaking/ FOLLOWING LOCAL AT DENTIST , drop O2 sats TO 50% DURING COLONOSCOPY  . Coronary artery disease   . Diverticulosis   . Dysrhythmia   . Environmental allergies   . GERD (gastroesophageal reflux  disease)   . History of hiatal hernia   . Hyperlipidemia   . Hypertension   . IBS (irritable bowel syndrome)   . Mitral valve disorder   . Shortness of breath dyspnea   . Syncope and collapse   . UTI (lower urinary tract infection)     Past Surgical History:  Procedure Laterality Date  . APPENDECTOMY    . BREAST BIOPSY Right 2002   POS  . BREAST CYST ASPIRATION Right   . BREAST EXCISIONAL BIOPSY Left 1997   NEG  . BREAST LUMPECTOMY    . CATARACT EXTRACTION W/PHACO Right 06/07/2016   Procedure: CATARACT EXTRACTION PHACO AND INTRAOCULAR LENS PLACEMENT (IOC);  Surgeon: Birder Robson, MD;  Location: [facility] ORS;  Service: Ophthalmology;  Laterality: Right;  [place] 00:51 AP% 19.6 CDE 10.09 Fluid pack lot # [id number]  . CATARACT EXTRACTION W/PHACO Left 07/05/2016   Procedure: CATARACT EXTRACTION PHACO AND INTRAOCULAR LENS PLACEMENT (IOC);  Surgeon: Birder Robson, MD;  Location: [facility] ORS;  Service: Ophthalmology;  Laterality: Left;  [place] 00:38 AP% 24.9 CDE 9.60 Fluid Pack lot # [id number]  . CESAREAN SECTION    . TONSILLECTOMY    . TOTAL KNEE ARTHROPLASTY Left     Social History   Socioeconomic History  . Marital status: Married    Spouse name: Not on file  . Number of children: Not on file  .  Years of education: Not on file  . Highest education level: Not on file  Occupational History  . Occupation: retired    Comment: Nurse, adult  . Financial resource strain: Not on file  . Food insecurity:    Worry: Not on file    Inability: Not on file  . Transportation needs:    Medical: Not on file    Non-medical: Not on file  Tobacco Use  . Smoking status: Former Smoker    Years: 10.00    Types: Cigarettes    Last attempt to quit: 10/03/1969    Years since quitting: 48.7  . Smokeless tobacco: Never Used  Substance and Sexual Activity  . Alcohol use: Yes    Alcohol/week: 0.0 standard drinks    Comment: couple drinks/week  . Drug use: No  . Sexual activity: Not on  file  Lifestyle  . Physical activity:    Days per week: Not on file    Minutes per session: Not on file  . Stress: Not on file  Relationships  . Social connections:    Talks on phone: Not on file    Gets together: Not on file    Attends religious service: Not on file    Active member of club or organization: Not on file    Attends meetings of clubs or organizations: Not on file    Relationship status: Not on file  . Intimate partner violence:    Fear of current or ex partner: Not on file    Emotionally abused: Not on file    Physically abused: Not on file    Forced sexual activity: Not on file  Other Topics Concern  . Not on file  Social History Narrative  . Not on file   Family History  Problem Relation Age of Onset  . Lung cancer Father   . Prostate cancer Father   . Hyperlipidemia Brother   . Hypertension Brother   . Obesity Brother   . Heart disease Mother   . Lung cancer Sister   . Kidney disease Unknown        Maternal grandparent  . Breast cancer Neg Hx       VITAL SIGNS Ht 5\' 4"  (1.626 m)   Wt 160 lb (72.6 kg)   BMI 27.46 kg/m   Outpatient Encounter Medications as of 07/05/2018  Medication Sig  . acetaminophen (TYLENOL) 325 MG tablet Take 975 mg by mouth every 6 (six) hours as needed.  Marland Kitchen amLODipine (NORVASC) 5 MG tablet Take 2.5 mg by mouth daily.  Marland Kitchen apixaban (ELIQUIS) 2.5 MG TABS tablet Take 2.5 mg by mouth 2 (two) times daily.  Marland Kitchen atorvastatin (LIPITOR) 40 MG tablet Take 40 mg by mouth daily.  . clonazePAM (KLONOPIN) 0.25 MG disintegrating tablet Take 1 tablet (0.25 mg total) by mouth 2 (two) times daily.  Marland Kitchen dexamethasone (DECADRON) 2 MG tablet Take 2 mg by mouth 2 (two) times daily with a meal.  . famotidine (PEPCID) 20 MG tablet Take 20 mg by mouth 2 (two) times daily as needed.  . fenofibrate (TRICOR) 145 MG tablet Take 145 mg by mouth daily.  . Lacosamide (VIMPAT) 100 MG TABS Take 1 tablet (100 mg total) by mouth every 12 (twelve) hours.  .  levETIRAcetam (KEPPRA) 750 MG tablet Take 1,500 mg by mouth 2 (two) times daily.  . magnesium oxide (MAG-OX) 400 MG tablet Take 400 mg by mouth 2 (two) times daily.  . metoprolol tartrate (LOPRESSOR) 25 MG  tablet Take 0.5 tablets (12.5 mg total) by mouth 2 (two) times daily.  . mirtazapine (REMERON) 15 MG tablet Take 7.5 mg by mouth at bedtime. 1/2 tab  . Multiple Vitamin (MULTIVITAMIN WITH MINERALS) TABS tablet Take 1 tablet by mouth daily.  . naphazoline-pheniramine (NAPHCON-A) 0.025-0.3 % ophthalmic solution Place 1 drop into both eyes every 6 (six) hours as needed. relieve eye redness/allergies  . NON FORMULARY Diet Type: Regular  . ondansetron (ZOFRAN) 8 MG tablet Take 8 mg by mouth every 8 (eight) hours as needed for nausea.  . polyethylene glycol (MIRALAX / GLYCOLAX) packet Take 17 g by mouth daily.  . traMADol (ULTRAM) 50 MG tablet Take 0.5 tablets (25 mg total) by mouth every 6 (six) hours as needed. If pain not relieved by tylenol   No facility-administered encounter medications on file as of 07/05/2018.      SIGNIFICANT DIAGNOSTIC EXAMS   PREVIOUS:   06-06-18: wbc 7.0; hgb 13.6; hc6 39.5; mcv 96; plt 146 glucose 131; bun 13; creat 0.6; k+ 4.1; na++ 140; ca 9.5   NO NEW LABS   Review of Systems  Constitutional: Negative for malaise/fatigue.  Respiratory: Negative for cough and shortness of breath.   Cardiovascular: Negative for chest pain, palpitations and leg swelling.  Gastrointestinal: Negative for abdominal pain, constipation and heartburn.  Musculoskeletal: Negative for back pain, joint pain and myalgias.  Skin: Negative.   Neurological: Negative for dizziness.  Psychiatric/Behavioral: The patient is not nervous/anxious.     Physical Exam  Constitutional: She is oriented to person, place, and time. She appears well-developed and well-nourished. No distress.  Neck: No thyromegaly present.  Cardiovascular: Normal rate, regular rhythm, normal heart sounds and intact  distal pulses.  Pulmonary/Chest: Effort normal and breath sounds normal. No respiratory distress.  Abdominal: Soft. Bowel sounds are normal. She exhibits no distension. There is no tenderness.  Musculoskeletal: She exhibits no edema.  Right hemiparesis.  Splint right wrist and right lower leg    Lymphadenopathy:    She has no cervical adenopathy.  Neurological: She is alert and oriented to person, place, and time.  Skin: Skin is warm and dry. She is not diaphoretic.  Scab on top of head   Psychiatric: She has a normal mood and affect.     ASSESSMENT/ PLAN:  TODAY:   1. Essential hypertension: is stable b/p 123/78: will continue norvasc 2.5 mg daily lopressor 12.5 mg twice daily   2. Chronic atrial fibrillation: heart rate is stable: will continue eliquis 2.5 mg twice daily   3. Ischemic stroke of frontal lobe: is neurologically stable; has right hemiparesis: will continue eliquis 2.5 mg twice daily   4.  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 Right radius fracture: is stable will continue to monitor and will follow up with orthopedics  7. Chronic constipation: is stable will continue miralax daily   8. Anxiety: is stable klonopin 0.25 mg twice daily and is taking remeron 7.5 mg nightly   9. 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 Is followed by oncology          MD is aware of resident's narcotic use and is in agreement with current plan of  care. We will attempt to wean resident as apropriate   Ok Edwards NP [facility]  Contact [phone] Monday through Friday 8am- 5pm  After hours call [phone]

## 2018-07-09 ENCOUNTER — Encounter: Payer: Self-pay | Admitting: Adult Health

## 2018-07-09 ENCOUNTER — Non-Acute Institutional Stay (SKILLED_NURSING_FACILITY): Payer: Medicare Other | Admitting: Adult Health

## 2018-07-09 DIAGNOSIS — S0181XS Laceration without foreign body of other part of head, sequela: Secondary | ICD-10-CM

## 2018-07-09 DIAGNOSIS — C711 Malignant neoplasm of frontal lobe: Secondary | ICD-10-CM | POA: Diagnosis not present

## 2018-07-10 ENCOUNTER — Encounter: Payer: Self-pay | Admitting: Adult Health

## 2018-07-10 NOTE — Progress Notes (Signed)
Location:  [facility] Number: [id number] Place of Service:  SNF (31) Provider:  Durenda Age, NP  Patient Care Team: Glendon Axe, MD as PCP - General (Internal Medicine)  Extended Emergency Contact Information Primary Emergency Contact: Silliman,Philip Address: [address]          [place], [place] [address] Johnnette Litter of [place] Phone: [phone] Relation: Spouse Secondary Emergency Contact: Tiarna, Koppen Home Phone: [phone] Mobile Phone: [phone] Relation: Son  Code Status:  Full Code  Goals of care: Advanced Directive information Advanced Directives 07/05/2018  Does Patient Have a Medical Advance Directive? No  Type of Advance Directive -  Does patient want to make changes to medical advance directive? -  Copy of [place] in Chart? -  Would patient like information on creating a medical advance directive? No - Patient declined     Chief Complaint  Patient presents with  . Acute Visit    Nursing requesting to see about suture removal.    HPI:  Pt is a 77 y.o. female seen today for an acute visit to assess a cranial surgery wound and determine if sutures could be removed. She had a fall and sustained a laceration on her right temporal area. Wound is dry with no erythema. Noted to have a tiny suture still visible. She is a short-term rehabilitation resident at [facility].  She has a PMH of glioblastoma, breast cancer, colon polyps, atrial fibrillation, and carotid artery occlusion.    Past Medical History:  Diagnosis Date  . A-fib ([place])   . Arthritis   . Breast cancer ([place]) 2002   RT LUMPECTOMY  . Bronchitis   . Carotid artery narrowings   . 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 Carotid artery occlusion    RIGHT 50% BLOCKAGE  . Colon polyps   . Complication of anesthesia    shaking/ FOLLOWING LOCAL AT DENTIST , drop O2 sats TO 50% DURING COLONOSCOPY  . Coronary artery disease     . Diverticulosis   . Dysrhythmia   . Environmental allergies   . GERD (gastroesophageal reflux disease)   . History of hiatal hernia   . Hyperlipidemia   . Hypertension   . IBS (irritable bowel syndrome)   . Mitral valve disorder   . Shortness of breath dyspnea   . Syncope and collapse   . UTI (lower urinary tract infection)    Past Surgical History:  Procedure Laterality Date  . APPENDECTOMY    . BREAST BIOPSY Right 2002   POS  . BREAST CYST ASPIRATION Right   . BREAST EXCISIONAL BIOPSY Left 1997   NEG  . BREAST LUMPECTOMY    . CATARACT EXTRACTION W/PHACO Right 06/07/2016   Procedure: CATARACT EXTRACTION PHACO AND INTRAOCULAR LENS PLACEMENT (IOC);  Surgeon: Birder Robson, MD;  Location: [facility] ORS;  Service: Ophthalmology;  Laterality: Right;  [place] 00:51 AP% 19.6 CDE 10.09 Fluid pack lot # [id number]  . CATARACT EXTRACTION W/PHACO Left 07/05/2016   Procedure: CATARACT EXTRACTION PHACO AND INTRAOCULAR LENS PLACEMENT (IOC);  Surgeon: Birder Robson, MD;  Location: [facility] ORS;  Service: Ophthalmology;  Laterality: Left;  [place] 00:38 AP% 24.9 CDE 9.60 Fluid Pack lot # [id number]  . CESAREAN SECTION    . TONSILLECTOMY    . TOTAL KNEE ARTHROPLASTY Left     Allergies  Allergen Reactions  . Nitroglycerin Other (See Comments)    Can only use the patch  Turned lobster red and felt  faint with sublingual NTG  . Penicillins Hives    Hives Has patient had a PCN reaction causing immediate rash, facial/tongue/throat swelling, SOB or lightheadedness with hypotension: YES Has patient had a PCN reaction causing severe rash involving mucus membranes or skin necrosis: NO Has patient had a PCN reaction that required hospitalization NO Has patient had a PCN reaction occurring within the last 10 years: No If all of the above answers are "NO", then may proceed with Cephalosporin use.  . Biaxin [Clarithromycin] Rash    Rash   . Latex Rash  . Lidocaine Swelling    Eye swelling Tolerates Tetracaine  with epidural steroid injections (04/10/17)  . Nickel Rash  . Oxycodone Hives  . Tape Rash  . Tetracyclines & Related Rash       . Tizanidine Rash    Outpatient Encounter Medications as of 07/09/2018  Medication Sig  . acetaminophen (TYLENOL) 325 MG tablet Take 975 mg by mouth every 6 (six) hours as needed.  Marland Kitchen amLODipine (NORVASC) 5 MG tablet Take 2.5 mg by mouth daily.  Marland Kitchen apixaban (ELIQUIS) 2.5 MG TABS tablet Take 2.5 mg by mouth 2 (two) times daily.  Marland Kitchen atorvastatin (LIPITOR) 40 MG tablet Take 40 mg by mouth daily.  . clonazePAM (KLONOPIN) 0.25 MG disintegrating tablet Take 1 tablet (0.25 mg total) by mouth 2 (two) times daily.  Marland Kitchen dexamethasone (DECADRON) 2 MG tablet Take 2 mg by mouth 2 (two) times daily with a meal.  . famotidine (PEPCID) 20 MG tablet Take 20 mg by mouth 2 (two) times daily as needed.  . fenofibrate (TRICOR) 145 MG tablet Take 145 mg by mouth daily.  . Lacosamide (VIMPAT) 100 MG TABS Take 1 tablet (100 mg total) by mouth every 12 (twelve) hours.  . levETIRAcetam (KEPPRA) 750 MG tablet Take 1,500 mg by mouth 2 (two) times daily.  . magnesium oxide (MAG-OX) 400 MG tablet Take 400 mg by mouth 2 (two) times daily.  . metoprolol tartrate (LOPRESSOR) 25 MG tablet Take 0.5 tablets (12.5 mg total) by mouth 2 (two) times daily.  . mirtazapine (REMERON) 15 MG tablet Take 7.5 mg by mouth at bedtime. 1/2 tab  . Multiple Vitamin (MULTIVITAMIN WITH MINERALS) TABS tablet Take 1 tablet by mouth daily.  . naphazoline-pheniramine (NAPHCON-A) 0.025-0.3 % ophthalmic solution Place 1 drop into both eyes every 6 (six) hours as needed. relieve eye redness/allergies  . NON FORMULARY Diet Type: Regular  . ondansetron (ZOFRAN) 8 MG tablet Take 8 mg by mouth every 8 (eight) hours as needed for nausea.  . polyethylene glycol (MIRALAX / GLYCOLAX) packet Take 17 g by mouth daily.  . traMADol (ULTRAM) 50 MG tablet Take 0.5 tablets (25 mg total) by mouth every 6 (six) hours as needed. If pain not  relieved by tylenol   No facility-administered encounter medications on file as of 07/09/2018.     Review of Systems  GENERAL: No change in appetite, no fatigue, no weight changes, no fever, chills or weakness MOUTH and THROAT: Denies oral discomfort, gingival pain or bleeding, pain from teeth or hoarseness   RESPIRATORY: no cough, SOB, DOE, wheezing, hemoptysis CARDIAC: No chest pain, edema or palpitations GI: No abdominal pain, diarrhea, constipation, heart burn, nausea or vomiting GU: Denies dysuria, frequency, hematuria, incontinence, or discharge PSYCHIATRIC: Denies feelings of depression or anxiety. No report of hallucinations, insomnia, paranoia, or agitation   Immunization History  Administered Date(s) Administered  . Hepatitis B, adult 05/19/2016  . Influenza, High Dose Seasonal PF 08/19/2016, 07/11/2017,  06/12/2018  . Pneumococcal Conjugate-13 08/03/2017  . Pneumococcal Polysaccharide-23 05/19/2016  . Tdap 03/07/2016  . Zoster Recombinat (Shingrix) 07/03/2017, 12/28/2017   Pertinent  Health Maintenance Due  Topic Date Due  . INFLUENZA VACCINE  Completed  . DEXA SCAN  Completed  . PNA vac Low Risk Adult  Completed   Fall Risk  05/07/2018 06/06/2017 11/25/2016  Falls in the past year? Yes No No  Number falls in past yr: 1 - -  Injury with Fall? No - -  Risk for fall due to : Other (Comment) - -  Risk for fall due to: Comment bathroom accident - -  Follow up Falls prevention discussed - -      Vitals:   07/09/18 1356  BP: 123/78  Pulse: 73  Resp: 18  Temp: 98 F (36.7 C)  TempSrc: Oral  SpO2: 93%  Weight: 160 lb (72.6 kg)  Height: 5\' 4"  (1.626 m)   Body mass index is 27.46 kg/m.  Physical Exam  GENERAL APPEARANCE: Well nourished. In no acute distress. Normal body habitus SKIN:  Right temporal area with small sutures still visible, no erythema, dry.  MOUTH and THROAT: Lips are without lesions. Oral mucosa is moist and without lesions. Tongue is normal in  shape, size, and color and without lesions RESPIRATORY: Breathing is even & unlabored, BS CTAB CARDIAC: RRR, no murmur,no extra heart sounds, no edema GI: Abdomen soft, normal BS, no masses, no tenderness EXTREMITIES:  Right leg and right wrist with splint  NEUROLOGICAL: There is no tremor. Speech is clear. Right wrist splint. PSYCHIATRIC: Alert and oriented X 3. Affect and behavior are appropriate   Labs reviewed: Recent Labs    04/26/18 0452 05/20/18 1703 07/03/18 0620  NA 142 140 137  K 3.9 3.6 4.2  CL 108 107 102  CO2 28 25 27   GLUCOSE 98 96 108*  BUN 20 10 22   CREATININE 0.73 0.66 0.52  CALCIUM 9.4 9.8 8.9   Recent Labs    04/16/18 1932 04/20/18 0527 07/03/18 0620  AST 41 30 30  ALT 21 18 44  ALKPHOS 46 32* 52  BILITOT 1.2 1.1 0.9  PROT 6.5 5.3* 5.5*  ALBUMIN 4.3 3.3* 3.4*   Recent Labs    04/20/18 0527 04/26/18 0452 05/20/18 1703 07/03/18 0620  WBC 4.2 4.3 6.8 4.9  NEUTROABS 1.4*  --  2.4 3.3  HGB 11.7* 11.4* 13.6 13.8  HCT 35.7* 34.3* 39.1 39.6  MCV 102.9* 103.3* 97.8 101.3*  PLT 110* 128* 128* 46*   Lab Results  Component Value Date   TSH 1.90 06/06/2017   Lab Results  Component Value Date   HGBA1C 5.0 04/21/2018   Lab Results  Component Value Date   CHOL 104 04/18/2018   HDL 15 (L) 04/18/2018   LDLCALC 60 04/18/2018   TRIG 144 04/18/2018   CHOLHDL 6.9 04/18/2018    Significant Diagnostic Results in last 30 days:  Mr Jeri Cos QQ Contrast  Result Date: 07/05/2018 CLINICAL DATA:  Follow-up glioblastoma multiforme, status post biopsy. EXAM: MRI HEAD WITHOUT AND WITH CONTRAST TECHNIQUE: Multiplanar, multiecho pulse sequences of the brain and surrounding structures were obtained without and with intravenous contrast. CONTRAST:  40mL MULTIHANCE GADOBENATE DIMEGLUMINE 529 MG/ML IV SOLN COMPARISON:  MRI of the head May 20, 2018 FINDINGS: INTRACRANIAL CONTENTS: Heterogeneously reduced diffusion of LEFT parietal lobe masses with heterogeneous  enhancement. Bridging tumor spanning the 2 prior nodules in toto now measuring 1.8 x 2.2 x 3.6 cm. At least 4  new surrounding enhancing satellite nodules measuring 5 mm or less within 1 cm dominant tumor decreased Peri tumoral vasogenic edema versus decreasing and nonenhancing infiltrative tumor. Increasing subependymal enhancement. No suspicious distal enhancement. Small amount of susceptibility artifact along LEFT parietal biopsy approach in within the tumor. Again noted is susceptibility artifact RIGHT central sulcus. No significant mass effect. No midline shift. Re-expanded LEFT lateral ventricle. Moderate general parenchymal brain volume loss. Patchy supratentorial white matter FLAIR T2 hyperintensities exclusive aforementioned abnormality compatible with mild-to-moderate chronic small vessel ischemic changes. No abnormal extra-axial fluid collections. VASCULAR: Normal major intracranial vascular flow voids present at skull base. SKULL AND UPPER CERVICAL SPINE: No abnormal sellar expansion. No suspicious calvarial bone marrow signal. New LEFT parietal burr hole. Craniocervical junction maintained. SINUSES/ORBITS: The mastoid air-cells and included paranasal sinuses are well-aerated.The included ocular globes and orbital contents are non-suspicious. Status post bilateral ocular lens implants. OTHER: None. IMPRESSION: 1. Interval disease progression; bridging tumor with single LEFT parietal 1.8 x 2.2 x 3.6 cm GBM and at least 4 new marginal satellite nodules. However, decreased edema/nonenhancing tumor. Status post biopsy. 2. RIGHT central sulcus superficial siderosis. Electronically Signed   By: Elon Alas M.D.   On: 07/05/2018 14:59    Assessment/Plan  1. Laceration of other part of head without foreign body, sequela - small amount of suture still visible, no redness nor discharge noted, continue treatment and monitor for signs of infection   Family/ staff Communication:  Discussed plan of care  with resident.  Labs/tests ordered:  None  Goals of care:   Short-term rehabilitation.   Durenda Age, NP [facility] [phone] (Monday-Friday 8:00 a.m. - 5:00 p.m.) [phone] (after hours)

## 2018-07-11 DIAGNOSIS — K5909 Other constipation: Secondary | ICD-10-CM | POA: Insufficient documentation

## 2018-07-11 DIAGNOSIS — C713 Malignant neoplasm of parietal lobe: Secondary | ICD-10-CM | POA: Insufficient documentation

## 2018-07-11 DIAGNOSIS — S5291XA Unspecified fracture of right forearm, initial encounter for closed fracture: Secondary | ICD-10-CM | POA: Insufficient documentation

## 2018-07-11 DIAGNOSIS — F419 Anxiety disorder, unspecified: Secondary | ICD-10-CM | POA: Insufficient documentation

## 2018-07-11 DIAGNOSIS — G40109 Localization-related (focal) (partial) symptomatic epilepsy and epileptic syndromes with simple partial seizures, not intractable, without status epilepticus: Secondary | ICD-10-CM | POA: Insufficient documentation

## 2018-07-12 ENCOUNTER — Other Ambulatory Visit: Payer: Self-pay | Admitting: Nurse Practitioner

## 2018-07-12 ENCOUNTER — Other Ambulatory Visit: Payer: Self-pay | Admitting: Radiation Therapy

## 2018-07-12 DIAGNOSIS — C711 Malignant neoplasm of frontal lobe: Secondary | ICD-10-CM

## 2018-07-12 NOTE — Progress Notes (Signed)
Location/Histology of Brain Tumor:  05/31/18 DIAGNOSIS A, B. Brain tissue, left frontoparietal, biopsy:  Glioblastoma, IDH-wildtype, WHO grade IV.    Patient presented with symptoms of: She presented to an Emergency Department (OSH) after a fall at home on 05/21/18. CT head and later MRI brain performed, demonstrated two peripherally enhancing left parietal lesions with mild associated edema. Imaging was also read as concerning for possible SAH. Transferred to [facility] for neurosurgical evaluation.   Past or anticipated interventions, if any, per neurosurgery:  05/31/18 . STERIOTACTIC BIOPSY/ASPIRATION/EXCISION INTRACRANIAL LESION Left 05/31/2018  Procedure: **AIRO#1**STEREOTACTIC BIOPSY, ASPIRATION, OR EXCISION, INCLUDING BURR HOLE(S), FOR INTRACRANIAL LESION; WITH COMPUTED TOMOGRAPHY; Surgeon: Malen Gauze, MD; Location: [facility] OPERATING ROOMS; Service: Neurosurgery; Laterality: Left; CT [place]   Past or anticipated interventions, if any, per medical oncology:  Dr. Foye Deer Assessment & Plan:  1. Left Glioblastoma of frontal parietal lobe (CMS-HCC) Aide Mcgregor returns to clinic for routine follow up. Shaunice Tangen is clinically stable and radiographically progressive. She is ready to pursue any and all treatment available to her.  Recommendations: Proceed with radiation alone, recommend abbreviated 3 week course given her overall performance status. We would not give Temodar concurrently as she has already failed this agent.  Pertinent issues discussed today were related to side effects of treatment and strategies at mitigating the impact of these on quality of life.   She would like to receive treatment locally so we will facilitate a referral to Dr. Shary Key with [facility]. We recommend she return to [facility] 2-3 weeks after the completion of radiation for assessment. She was provided a prescription for a brain MRI. She will bring a copy for review. We discussed the role of the [facility] team in  relation to the local primary team; we will serve as a consulting service in care and will work collaboratively with local physicians. They are in agreement with this arrangement and verbalized understanding  Dose of Decadron, if applicable: Decadron 2 mg twice daily.   Recent neurologic symptoms, if any:   Seizures: She reports one seizure at the time of diagnosis  Headaches: She reports small headaches relieved by Tylenol.  Nausea: No  Dizziness/ataxia: No  Difficulty with hand coordination: No  Focal numbness/weakness: She is unable to use her right leg since her original seizure.   Visual deficits/changes: No  Confusion/Memory deficits: No  Painful bone metastases at present, if any: N/A  SAFETY ISSUES:  Prior radiation? No  Pacemaker/ICD? No  Possible current pregnancy? No  Is the patient on methotrexate? No  Additional Complaints / other details:  07/13/18 Dr. Mickeal Skinner Assessment/Plan 1. Glioblastoma of parietal lobe ([place])  Ms. Fullman is clinically stable but has progression of disease after 1 cycle of upfront dosing of 5-day TMZ.  We are happy and agreeable to implement plan set out by the [place] Team for abbreviated IMRT.  She will have formal consultation with radiation oncology next week.  Once date of therapy is established we will arrange appropriate follow up and monitoring.  Will recheck CBC in 2-3 weeks given thrombocytopenia on recent set of labs.  We appreciate the opportunity to participate in the care of [place].   We spent twenty additional minutes teaching regarding the natural history, biology, and historical experience in the treatment of brain tumors. We then discussed in detail the current recommendations for therapy focusing on the mode of administration, mechanism of action, anticipated toxicities, and quality of life issues associated with this plan. We also provided teaching sheets  for the patient to take home as an additional  resource.  All questions were answered. The patient knows to call the clinic with any problems, questions or concerns. No barriers to learning were detected  -She lives at [facility] in [place].   BP 127/70 (BP Location: Left Arm, Patient Position: Sitting)   Pulse 70   Temp 98.4 F (36.9 C) (Oral)   Resp 20   SpO2 99%    Wt Readings from Last 3 Encounters:  07/09/18 160 lb (72.6 kg)  07/05/18 160 lb (72.6 kg)  06/27/18 160 lb (72.6 kg)

## 2018-07-13 ENCOUNTER — Inpatient Hospital Stay: Payer: No Typology Code available for payment source | Attending: Internal Medicine | Admitting: Internal Medicine

## 2018-07-13 ENCOUNTER — Ambulatory Visit: Payer: Medicare Other | Admitting: Internal Medicine

## 2018-07-13 VITALS — BP 122/69 | HR 70 | Temp 98.3°F | Resp 18

## 2018-07-13 DIAGNOSIS — R569 Unspecified convulsions: Secondary | ICD-10-CM | POA: Insufficient documentation

## 2018-07-13 DIAGNOSIS — Z801 Family history of malignant neoplasm of trachea, bronchus and lung: Secondary | ICD-10-CM | POA: Insufficient documentation

## 2018-07-13 DIAGNOSIS — E785 Hyperlipidemia, unspecified: Secondary | ICD-10-CM | POA: Insufficient documentation

## 2018-07-13 DIAGNOSIS — I4891 Unspecified atrial fibrillation: Secondary | ICD-10-CM | POA: Insufficient documentation

## 2018-07-13 DIAGNOSIS — C713 Malignant neoplasm of parietal lobe: Secondary | ICD-10-CM | POA: Diagnosis not present

## 2018-07-13 DIAGNOSIS — K219 Gastro-esophageal reflux disease without esophagitis: Secondary | ICD-10-CM | POA: Diagnosis not present

## 2018-07-13 DIAGNOSIS — I2510 Atherosclerotic heart disease of native coronary artery without angina pectoris: Secondary | ICD-10-CM | POA: Diagnosis not present

## 2018-07-13 DIAGNOSIS — M129 Arthropathy, unspecified: Secondary | ICD-10-CM

## 2018-07-13 DIAGNOSIS — Z79899 Other long term (current) drug therapy: Secondary | ICD-10-CM | POA: Insufficient documentation

## 2018-07-13 DIAGNOSIS — D696 Thrombocytopenia, unspecified: Secondary | ICD-10-CM | POA: Diagnosis not present

## 2018-07-13 DIAGNOSIS — Z87891 Personal history of nicotine dependence: Secondary | ICD-10-CM | POA: Insufficient documentation

## 2018-07-13 DIAGNOSIS — K449 Diaphragmatic hernia without obstruction or gangrene: Secondary | ICD-10-CM

## 2018-07-13 DIAGNOSIS — Z9181 History of falling: Secondary | ICD-10-CM | POA: Diagnosis not present

## 2018-07-13 DIAGNOSIS — I10 Essential (primary) hypertension: Secondary | ICD-10-CM

## 2018-07-13 DIAGNOSIS — K589 Irritable bowel syndrome without diarrhea: Secondary | ICD-10-CM | POA: Diagnosis not present

## 2018-07-13 DIAGNOSIS — Z86010 Personal history of colonic polyps: Secondary | ICD-10-CM | POA: Insufficient documentation

## 2018-07-13 DIAGNOSIS — Z7901 Long term (current) use of anticoagulants: Secondary | ICD-10-CM

## 2018-07-13 NOTE — Progress Notes (Signed)
[facility] [place], [place] [address] [phone]   New Patient Evaluation  Date of Service: 07/13/18 Patient Name: Isabel Hernandez Patient MRN: [id number] Patient DOB: 1941-08-03 Provider: Ventura Sellers, MD  Identifying Statement:  Isabel Hernandez is a 77 y.o. female with left parietal glioblastoma who presents for initial consultation and evaluation.    Referring Provider: Glendon Axe, [facility] [place], [place] [address]  Oncologic History:  05/31/2018: Left frontal parietal stereotactic brain biopsy by Dr. Cathleen Fears. 07/09/2018: Status post 1 cycle of Temodar with disease progression. Recommend she proceed with radiation, 3 week course   Biomarkers:  MGMT Unknown.  IDH 1/2 Wild type.  EGFR Unknown  TERT Unknown   History of Present Illness: The patient's records from the referring physician were obtained and reviewed and the patient interviewed to confirm this HPI.  Isabel Hernandez presents today for follow up at the request of her primary neuro-oncology team at [facility], Dr's. Foye Deer and Caryn Section.  The team has made formal recommendation for an abbreviated course of radiation therapy, which the patient would like to receive locally.  She had previously been dosed with one cycle of 5-day temozolomide with poor radiographic response.  Functional status remains poor since initial fall/seizure back in July 2019.  She has no purposeful use of the right leg, but describes no impairment in the right arm at all.  She recently had cast removed from right arm for distal radial fracture.  No hip fracture on CT pelvis.  Continues on 78m BID decadron, Vimpat, Keppra, and Eliquis.  Medications: Current Outpatient Medications on File Prior to Visit  Medication Sig Dispense Refill  . acetaminophen (TYLENOL) 325 MG tablet Take 975 mg by mouth every 6 (six) hours as needed.    .Marland Kitchen amLODipine (NORVASC) 5 MG tablet Take 2.5 mg by mouth daily.    .Marland Kitchenapixaban (ELIQUIS) 2.5 MG TABS tablet Take 2.5 mg by mouth 2 (two) times daily.    .Marland Kitchenatorvastatin (LIPITOR) 40 MG tablet Take 40 mg by mouth daily.    . clonazePAM (KLONOPIN) 0.25 MG disintegrating tablet Take 1 tablet (0.25 mg total) by mouth 2 (two) times daily. 60 tablet 0  . dexamethasone (DECADRON) 2 MG tablet Take 2 mg by mouth 2 (two) times daily with a meal.    . famotidine (PEPCID) 20 MG tablet Take 20 mg by mouth 2 (two) times daily as needed.    . fenofibrate (TRICOR) 145 MG tablet Take 145 mg by mouth daily.    . Lacosamide (VIMPAT) 100 MG TABS Take 1 tablet (100 mg total) by mouth every 12 (twelve) hours. 60 tablet 0  . levETIRAcetam (KEPPRA) 750 MG tablet Take 1,500 mg by mouth 2 (two) times daily.    . magnesium oxide (MAG-OX) 400 MG tablet Take 400 mg by mouth 2 (two) times daily.    . metoprolol tartrate (LOPRESSOR) 25 MG tablet Take 0.5 tablets (12.5 mg total) by mouth 2 (two) times daily. 5 tablet 0  . mirtazapine (REMERON) 15 MG tablet Take 7.5 mg by mouth at bedtime. 1/2 tab    . Multiple Vitamin (MULTIVITAMIN WITH MINERALS) TABS tablet Take 1 tablet by mouth daily.    . naphazoline-pheniramine (NAPHCON-A) 0.025-0.3 % ophthalmic solution Place 1 drop into both eyes every 6 (six) hours as needed. relieve eye redness/allergies    . NON FORMULARY Diet Type: Regular    .  ondansetron (ZOFRAN) 8 MG tablet Take 8 mg by mouth every 8 (eight) hours as needed for nausea.    . polyethylene glycol (MIRALAX / GLYCOLAX) packet Take 17 g by mouth daily.    . traMADol (ULTRAM) 50 MG tablet Take 0.5 tablets (25 mg total) by mouth every 6 (six) hours as needed. If pain not relieved by tylenol 60 tablet 0   No current facility-administered medications on file prior to visit.     Allergies:  Allergies  Allergen Reactions  . Nitroglycerin Other (See Comments)    Can only use the patch  Turned lobster red and felt faint with  sublingual NTG  . Penicillins Hives    Hives Has patient had a PCN reaction causing immediate rash, facial/tongue/throat swelling, SOB or lightheadedness with hypotension: YES Has patient had a PCN reaction causing severe rash involving mucus membranes or skin necrosis: NO Has patient had a PCN reaction that required hospitalization NO Has patient had a PCN reaction occurring within the last 10 years: No If all of the above answers are "NO", then may proceed with Cephalosporin use.  . Biaxin [Clarithromycin] Rash    Rash   . Latex Rash  . Lidocaine Swelling    Eye swelling Tolerates Tetracaine with epidural steroid injections (04/10/17)  . Nickel Rash  . Oxycodone Hives  . Tape Rash  . Tetracyclines & Related Rash       . Tizanidine Rash   Past Medical History:  Past Medical History:  Diagnosis Date  . A-fib ([place])   . Arthritis   . Breast cancer ([place]) 2002   RT LUMPECTOMY  . Bronchitis   . Carotid artery narrowings   . 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 Carotid artery occlusion    RIGHT 50% BLOCKAGE  . Colon polyps   . Complication of anesthesia    shaking/ FOLLOWING LOCAL AT DENTIST , drop O2 sats TO 50% DURING COLONOSCOPY  . Coronary artery disease   . Diverticulosis   . Dysrhythmia   . Environmental allergies   . GERD (gastroesophageal reflux disease)   . History of hiatal hernia   . Hyperlipidemia   . Hypertension   . IBS (irritable bowel syndrome)   . Mitral valve disorder   . Shortness of breath dyspnea   . Syncope and collapse   . UTI (lower urinary tract infection)    Past Surgical History:  Past Surgical History:  Procedure Laterality Date  . APPENDECTOMY    . BREAST BIOPSY Right 2002   POS  . BREAST CYST ASPIRATION Right   . BREAST EXCISIONAL BIOPSY Left 1997   NEG  . BREAST LUMPECTOMY    . CATARACT EXTRACTION W/PHACO Right 06/07/2016   Procedure: CATARACT EXTRACTION PHACO AND INTRAOCULAR LENS PLACEMENT (IOC);  Surgeon: Birder Robson,  MD;  Location: [facility] ORS;  Service: Ophthalmology;  Laterality: Right;  [place] 00:51 AP% 19.6 CDE 10.09 Fluid pack lot # [id number]  . CATARACT EXTRACTION W/PHACO Left 07/05/2016   Procedure: CATARACT EXTRACTION PHACO AND INTRAOCULAR LENS PLACEMENT (IOC);  Surgeon: Birder Robson, MD;  Location: [facility] ORS;  Service: Ophthalmology;  Laterality: Left;  [place] 00:38 AP% 24.9 CDE 9.60 Fluid Pack lot # [id number]  . CESAREAN SECTION    . TONSILLECTOMY    . TOTAL KNEE ARTHROPLASTY Left    Social History:  Social History   Socioeconomic History  . Marital status: Married    Spouse name: Not on file  . Number of children: Not on  file  . Years of education: Not on file  . Highest education level: Not on file  Occupational History  . Occupation: retired    Comment: Nurse, adult  . Financial resource strain: Not on file  . Food insecurity:    Worry: Not on file    Inability: Not on file  . Transportation needs:    Medical: Not on file    Non-medical: Not on file  Tobacco Use  . Smoking status: Former Smoker    Years: 10.00    Types: Cigarettes    Last attempt to quit: 10/03/1969    Years since quitting: 48.8  . Smokeless tobacco: Never Used  Substance and Sexual Activity  . Alcohol use: Yes    Alcohol/week: 0.0 standard drinks    Comment: couple drinks/week  . Drug use: No  . Sexual activity: Not on file  Lifestyle  . Physical activity:    Days per week: Not on file    Minutes per session: Not on file  . Stress: Not on file  Relationships  . Social connections:    Talks on phone: Not on file    Gets together: Not on file    Attends religious service: Not on file    Active member of club or organization: Not on file    Attends meetings of clubs or organizations: Not on file    Relationship status: Not on file  . Intimate partner violence:    Fear of current or ex partner: Not on file    Emotionally abused: Not on file    Physically abused: Not on file    Forced sexual  activity: Not on file  Other Topics Concern  . Not on file  Social History Narrative  . Not on file   Family History:  Family History  Problem Relation Age of Onset  . Lung cancer Father   . Prostate cancer Father   . Hyperlipidemia Brother   . Hypertension Brother   . Obesity Brother   . Heart disease Mother   . Lung cancer Sister   . Kidney disease Unknown        Maternal grandparent  . Breast cancer Neg Hx     Review of Systems: Constitutional: Denies fevers, chills or abnormal weight loss Eyes: Denies blurriness of vision Ears, nose, mouth, throat, and face: Denies mucositis or sore throat Respiratory: Denies cough, dyspnea or wheezes Cardiovascular: Denies palpitation, chest discomfort or lower extremity swelling Gastrointestinal:  Denies nausea, constipation, diarrhea GU: Denies dysuria or incontinence Skin: Denies abnormal skin rashes Neurological: Per HPI Musculoskeletal: Denies joint pain, back or neck discomfort. No decrease in ROM Behavioral/Psych: Denies anxiety, disturbance in thought content, and mood instability  Physical Exam: Vitals:   07/13/18 1115  BP: 122/69  Pulse: 70  Resp: 18  Temp: 98.3 F (36.8 C)  SpO2: 98%   KPS: 60. General: Alert, cooperative, pleasant, in no acute distress in wheelchair Head: Normal EENT: No conjunctival injection or scleral icterus. Oral mucosa moist Lungs: Resp effort normal Cardiac: Regular rate and rhythm Abdomen: Soft, non-distended abdomen Skin: No rashes cyanosis or petechiae. Extremities: ++edema right leg which is in brace  Neurologic Exam: Mental Status: Awake, alert, attentive to examiner. Oriented to self and environment. Language is fluent with intact comprehension.  Cranial Nerves: Visual acuity is grossly normal. Visual fields are full. Extra-ocular movements intact. No ptosis. Face is symmetric, tongue midline. Motor: Tone and bulk are normal. Power is impaired in right leg  only, 0/5. Reflexes are  symmetric, no pathologic reflexes present. Intact finger to nose bilaterally Sensory: Intact to light touch and temperature Gait: Deferred   Labs: I have reviewed the data as listed    Component Value Date/Time   NA 137 07/03/2018 0620   K 4.2 07/03/2018 0620   CL 102 07/03/2018 0620   CO2 27 07/03/2018 0620   GLUCOSE 108 (H) 07/03/2018 0620   BUN 22 07/03/2018 0620   CREATININE 0.52 07/03/2018 0620   CALCIUM 8.9 07/03/2018 0620   PROT 5.5 (L) 07/03/2018 0620   PROT 6.8 09/05/2016 1410   ALBUMIN 3.4 (L) 07/03/2018 0620   ALBUMIN 4.5 09/05/2016 1410   AST 30 07/03/2018 0620   ALT 44 07/03/2018 0620   ALKPHOS 52 07/03/2018 0620   BILITOT 0.9 07/03/2018 0620   BILITOT 1.0 09/05/2016 1410   GFRNONAA >60 07/03/2018 0620   GFRAA >60 07/03/2018 0620   Lab Results  Component Value Date   WBC 4.9 07/03/2018   NEUTROABS 3.3 07/03/2018   HGB 13.8 07/03/2018   HCT 39.6 07/03/2018   MCV 101.3 (H) 07/03/2018   PLT 46 (L) 07/03/2018    Imaging: [place] Clinician Interpretation: I have personally reviewed the CNS images as listed.  My interpretation, in the context of the patient's clinical presentation, is progressive disease  Mr Isabel Hernandez Wo Contrast  Result Date: 07/05/2018 CLINICAL DATA:  Follow-up glioblastoma multiforme, status post biopsy. EXAM: MRI HEAD WITHOUT AND WITH CONTRAST TECHNIQUE: Multiplanar, multiecho pulse sequences of the brain and surrounding structures were obtained without and with intravenous contrast. CONTRAST:  87m MULTIHANCE GADOBENATE DIMEGLUMINE 529 MG/ML IV SOLN COMPARISON:  MRI of the head May 20, 2018 FINDINGS: INTRACRANIAL CONTENTS: Heterogeneously reduced diffusion of LEFT parietal lobe masses with heterogeneous enhancement. Bridging tumor spanning the 2 prior nodules in toto now measuring 1.8 x 2.2 x 3.6 cm. At least 4 new surrounding enhancing satellite nodules measuring 5 mm or less within 1 cm dominant tumor decreased Peri tumoral vasogenic edema  versus decreasing and nonenhancing infiltrative tumor. Increasing subependymal enhancement. No suspicious distal enhancement. Small amount of susceptibility artifact along LEFT parietal biopsy approach in within the tumor. Again noted is susceptibility artifact RIGHT central sulcus. No significant mass effect. No midline shift. Re-expanded LEFT lateral ventricle. Moderate general parenchymal brain volume loss. Patchy supratentorial white matter FLAIR T2 hyperintensities exclusive aforementioned abnormality compatible with mild-to-moderate chronic small vessel ischemic changes. No abnormal extra-axial fluid collections. VASCULAR: Normal major intracranial vascular flow voids present at skull base. SKULL AND UPPER CERVICAL SPINE: No abnormal sellar expansion. No suspicious calvarial bone marrow signal. New LEFT parietal burr hole. Craniocervical junction maintained. SINUSES/ORBITS: The mastoid air-cells and included paranasal sinuses are well-aerated.The included ocular globes and orbital contents are non-suspicious. Status post bilateral ocular lens implants. OTHER: None. IMPRESSION: 1. Interval disease progression; bridging tumor with single LEFT parietal 1.8 x 2.2 x 3.6 cm GBM and at least 4 new marginal satellite nodules. However, decreased edema/nonenhancing tumor. Status post biopsy. 2. RIGHT central sulcus superficial siderosis. Electronically Signed   By: CElon AlasM.D.   On: 07/05/2018 14:59    Pathology:  Assessment/Plan 1. [place] of [place] lobe [place]  Isabel Hernandez is clinically stable but has progression of disease after 1 cycle of upfront dosing of 5-day TMZ.  We are happy and agreeable to implement plan set out by the [facility] for abbreviated IMRT.  She will have formal consultation with radiation oncology next week.  Once date of therapy is  established we will arrange appropriate follow up and monitoring.  Will recheck CBC in 2-3 weeks given thrombocytopenia on recent set of  labs.  We appreciate the opportunity to participate in the care of Isabel Hernandez.   We spent twenty additional minutes teaching regarding the natural history, biology, and historical experience in the treatment of brain tumors. We then discussed in detail the current recommendations for therapy focusing on the mode of administration, mechanism of action, anticipated toxicities, and quality of life issues associated with this plan. We also provided teaching sheets for the patient to take home as an additional resource.  All questions were answered. The patient knows to call the clinic with any problems, questions or concerns. No barriers to learning were detected.  The total time spent in the encounter was 45 minutes and more than 50% was on counseling and review of test results   Ventura Sellers, MD Medical Director of Neuro-Oncology [facility] [place] 07/13/18 3:28 PM

## 2018-07-16 ENCOUNTER — Telehealth: Payer: Self-pay

## 2018-07-16 ENCOUNTER — Inpatient Hospital Stay: Payer: No Typology Code available for payment source

## 2018-07-16 ENCOUNTER — Other Ambulatory Visit: Payer: Self-pay | Admitting: *Deleted

## 2018-07-16 ENCOUNTER — Telehealth: Payer: Self-pay | Admitting: *Deleted

## 2018-07-16 DIAGNOSIS — M8580 Other specified disorders of bone density and structure, unspecified site: Secondary | ICD-10-CM | POA: Diagnosis not present

## 2018-07-16 DIAGNOSIS — Y92009 Unspecified place in unspecified non-institutional (private) residence as the place of occurrence of the external cause: Secondary | ICD-10-CM | POA: Diagnosis not present

## 2018-07-16 DIAGNOSIS — Y33XXXA Other specified events, undetermined intent, initial encounter: Secondary | ICD-10-CM | POA: Diagnosis not present

## 2018-07-16 DIAGNOSIS — S52571A Other intraarticular fracture of lower end of right radius, initial encounter for closed fracture: Secondary | ICD-10-CM | POA: Diagnosis not present

## 2018-07-16 DIAGNOSIS — W1839XD Other fall on same level, subsequent encounter: Secondary | ICD-10-CM | POA: Diagnosis not present

## 2018-07-16 DIAGNOSIS — S52571D Other intraarticular fracture of lower end of right radius, subsequent encounter for closed fracture with routine healing: Secondary | ICD-10-CM | POA: Diagnosis not present

## 2018-07-16 NOTE — Telephone Encounter (Signed)
Per 10/14 no los °

## 2018-07-16 NOTE — Telephone Encounter (Signed)
Letter received from [facility] that the patient has a RX for losartan 50 mg tablets that has been affected by the losartan recall.  I spoke with the patient in August 2019 to try to schedule her for a post hospital appointment with Dr. Fletcher Anon and the patient refused.  She was going to follow with Dr. Ubaldo Glassing.  I will forward a copy of this message to Dr. Ubaldo Glassing.

## 2018-07-17 ENCOUNTER — Ambulatory Visit
Admission: RE | Admit: 2018-07-17 | Discharge: 2018-07-17 | Disposition: A | Discharge status: Home / Self Care | Payer: Medicare Other | Source: Ambulatory Visit | Attending: Radiation Oncology | Admitting: Radiation Oncology

## 2018-07-17 ENCOUNTER — Encounter: Payer: Self-pay | Admitting: Radiation Oncology

## 2018-07-17 ENCOUNTER — Other Ambulatory Visit: Payer: Self-pay

## 2018-07-17 VITALS — BP 127/70 | HR 70 | Temp 98.4°F | Resp 20

## 2018-07-17 DIAGNOSIS — Z510 Encounter for antineoplastic radiation therapy: Secondary | ICD-10-CM | POA: Insufficient documentation

## 2018-07-17 DIAGNOSIS — C713 Malignant neoplasm of parietal lobe: Secondary | ICD-10-CM | POA: Insufficient documentation

## 2018-07-17 NOTE — Progress Notes (Signed)
Radiation Oncology         [phone] ________________________________  Initial Outpatient Consultation  Name: Isabel Hernandez MRN: [id number]  Date: 07/17/2018  DOB: 06/04/1941  NL:GXQJJ, [name], MD  Glendon Axe, MD   REFERRING PHYSICIAN: Glendon Axe, MD  DIAGNOSIS:    ICD-10-CM   1. 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 female who initially presented with heaviness/numbness in the right leg. She was evaluated with at CT of the head on 04/16/18 which did not show any acute intracranial findings. A brain MRI on 04/17/18 showed a 2 cm "acute infarction" within the left posteromedial frontal lobe including precentral gyrus; no associated hemorrhage or mass effect. There was also an irregularly enhancing T2 FLAIR hyperintense lesion with increased diffusion visualized in the left posterior frontal periventricular white matter, adjacent to the "acute infarct."   Patient reports she underwent 5 days of rehab and was ambulating well with aid of a walker but then presented to the emergency department after suffering a fall/seziure at her home on 05/20/18. She is unclear about the details of her fall and does not know if she lost consciousness. When she fell, she fractured her right arm and tore ligaments in her right hip. No hip fracture seen on CT Pelvis. CT of the head on 05/20/18 was negative for acute intracranial hemorrhage but showed a hypodensity within the left posterior frontal lobe near the vertex. Brain MRI on 05/20/18 showed two adjacent enhancing lesions measuring up to 2.3 cm positioned at the parasagittal left frontoparietal region, with localized vasogenic edema without significant regional mass effect or midline shift.   She was then transferred to [facility] for neurosurgical evaluation. Stereotactic biopsy of the left frontoparietal brain tissue on 05/31/18 revealed:  Glioblastoma, IDH-wildtype, WHO grade IV. She received one cycle of 5 day Temodar, however Brain MRI on 07/05/18 showed interval disease progression; bridging tumor with single left parietal 1.8 x 2.2 x 3.6 cm GBM and at least 4 new marginal satellite nodules. However, decreased edema/nonenhancing tumor.   The patient was referred to our clinic at the request of her primary neuro-oncology team at [place]. She met with Dr. Mickeal Skinner on 07/13/18, and they have made the recommendation to proceed with an abbreviated course of radiotherapy alone. The patient resides in a nursing facility in [place] and would like to receive her radiation treatment here. She is accompanied today by her husband.   She reports very few headaches, relieved with Tylenol. Her functional status remains poor since initial fall back in August. She reports she cannot move her right lower extremity at all and requires help with her ADLs as she is wheelchair-bound. She denies weakness in right leg.  She reports swelling in her right lower extremity. She did have a Doppler of the right leg which was negative. She reports speech changes on rare occasion. She reports weakness in her right wrist due to fracture. She recently had the cast removed from her right arm. She is receiving PT everyday. She reports one seizure that occurred before her diagnosis. She reports twitching in her right shoulder. She continues on dexamethasone '2mg'$  twice a day, Vimpat, Keppra, and Eliquis.  She has a history of breast cancer diagnosed in 2002, treated with lumpectomy and Fosamax, in remission for >10 year per patient. She also reports A-fib.  PREVIOUS RADIATION THERAPY: No  PAST MEDICAL HISTORY:  has a past medical history of A-fib ([place]), Arthritis, Breast cancer ([place]) (2002), Bronchitis, Carotid artery narrowings, Carotid artery occlusion, Carotid artery occlusion, Colon polyps, Complication of anesthesia, Coronary artery disease,  Diverticulosis, Dysrhythmia, Environmental allergies, GERD (gastroesophageal reflux disease), History of hiatal hernia, Hyperlipidemia, Hypertension, IBS (irritable bowel syndrome), Mitral valve disorder, Shortness of breath dyspnea, Syncope and collapse, and UTI (lower urinary tract infection).    PAST SURGICAL HISTORY: Past Surgical History:  Procedure Laterality Date  . APPENDECTOMY    . BREAST BIOPSY Right 2002   POS  . BREAST CYST ASPIRATION Right   . BREAST EXCISIONAL BIOPSY Left 1997   NEG  . BREAST LUMPECTOMY    . CATARACT EXTRACTION W/PHACO Right 06/07/2016   Procedure: CATARACT EXTRACTION PHACO AND INTRAOCULAR LENS PLACEMENT (IOC);  Surgeon: Birder Robson, MD;  Location: [facility] ORS;  Service: Ophthalmology;  Laterality: Right;  [place] 00:51 AP% 19.6 CDE 10.09 Fluid pack lot # [id number]  . CATARACT EXTRACTION W/PHACO Left 07/05/2016   Procedure: CATARACT EXTRACTION PHACO AND INTRAOCULAR LENS PLACEMENT (IOC);  Surgeon: Birder Robson, MD;  Location: [facility] ORS;  Service: Ophthalmology;  Laterality: Left;  [place] 00:38 AP% 24.9 CDE 9.60 Fluid Pack lot # [id number]  . CESAREAN SECTION    . TONSILLECTOMY    . TOTAL KNEE ARTHROPLASTY Left     FAMILY HISTORY: family history includes Heart disease in her mother; Hyperlipidemia in her brother; Hypertension in her brother; Kidney disease in her unknown relative; Lung cancer in her father and sister; Obesity in her brother; Prostate cancer in her father.  SOCIAL HISTORY:  reports that she quit smoking about 48 years ago. Her smoking use included cigarettes. She quit after 10.00 years of use. She has never used smokeless tobacco. She reports that she drank alcohol. She reports that she does not use drugs. From [place], [place]. 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 Has two sons.  ALLERGIES: Nitroglycerin; Penicillins; Biaxin [clarithromycin]; Latex; Lidocaine; Nickel; Oxycodone; Tape; Tetracyclines & related; and  Tizanidine  MEDICATIONS:  Current Outpatient Medications  Medication Sig Dispense Refill  . acetaminophen (TYLENOL) 325 MG tablet Take 975 mg by mouth every 6 (six) hours as needed.    Marland Kitchen amLODipine (NORVASC) 5 MG tablet Take 2.5 mg by mouth daily.    Marland Kitchen apixaban (ELIQUIS) 2.5 MG TABS tablet Take 2.5 mg by mouth 2 (two) times daily.    Marland Kitchen atorvastatin (LIPITOR) 40 MG tablet Take 40 mg by mouth daily.    . clonazePAM (KLONOPIN) 0.25 MG disintegrating tablet Take 1 tablet (0.25 mg total) by mouth 2 (two) times daily. 60 tablet 0  . dexamethasone (DECADRON) 2 MG tablet Take 2 mg by mouth 2 (two) times daily with a meal.    . famotidine (PEPCID) 20 MG tablet Take 20 mg by mouth 2 (two) times daily as needed.    . fenofibrate (TRICOR) 145 MG tablet Take 145 mg by mouth daily.    . Lacosamide (VIMPAT) 100 MG TABS Take 1 tablet (100 mg total) by mouth every 12 (twelve) hours. 60 tablet 0  . levETIRAcetam (KEPPRA) 750 MG tablet Take 1,500 mg by mouth 2 (two) times daily.    . magnesium oxide (MAG-OX) 400 MG tablet Take 400 mg by mouth 2 (two) times daily.    . metoprolol tartrate (LOPRESSOR) 25 MG tablet Take 0.5 tablets (12.5 mg total) by mouth 2 (two) times daily. 5 tablet 0  . mirtazapine (REMERON) 15 MG tablet Take 7.5 mg by mouth at  bedtime. 1/2 tab    . Multiple Vitamin (MULTIVITAMIN WITH MINERALS) TABS tablet Take 1 tablet by mouth daily.    . NON FORMULARY Diet Type: Regular    . ondansetron (ZOFRAN) 8 MG tablet Take 8 mg by mouth every 8 (eight) hours as needed for nausea.    . polyethylene glycol (MIRALAX / GLYCOLAX) packet Take 17 g by mouth daily.    . traMADol (ULTRAM) 50 MG tablet Take 0.5 tablets (25 mg total) by mouth every 6 (six) hours as needed. If pain not relieved by tylenol 60 tablet 0   No current facility-administered medications for this encounter.     REVIEW OF SYSTEMS:  A 10+ POINT REVIEW OF SYSTEMS WAS OBTAINED including neurology, dermatology, psychiatry, cardiac,  respiratory, lymph, extremities, GI, GU, Musculoskeletal, constitutional, breasts, reproductive, HEENT.  All pertinent positives are noted in the HPI.  All others are negative.   PHYSICAL EXAM:  oral temperature is 98.4 F (36.9 C). Her blood pressure is 127/70 and her pulse is 70. Her respiration is 20 and oxygen saturation is 99%.   General: Alert and oriented, in no acute distress. HEENT: Head is normocephalic. She has very subtle nystagmus on lateral gaze. Oropharynx is clear, no sign of thrush. Neck: Neck is supple, no palpable cervical or supraclavicular lymphadenopathy. Heart: Regular in rate and rhythm with no murmurs, rubs, or gallops.  Chest: Clear to auscultation bilaterally, with no rhonchi, wheezes, or rales. Abdomen: Soft, nontender, nondistended, with no rigidity or guarding. Extremities: She has profound swelling in her right lower extremity. Lymphatics: see Neck Exam Skin: No concerning lesions. Musculoskeletal: She has some subtle weakness in the right arm. She has some subtle weakness in the right upper extremity which may be related to recent fracture.  Neurologic: Cranial nerves II through XII are grossly intact. No obvious focalities. Speech is fluent. Coordination is intact. Sensation is intact throughout. Finger-to-nose testing is intact. Psychiatric: Judgment and insight are intact. Affect is appropriate.  KPS = 40  100 - Normal; no complaints; no evidence of disease. 90   - Able to carry on normal activity; minor signs or symptoms of disease. 80   - Normal activity with effort; some signs or symptoms of disease. 77   - Cares for self; unable to carry on normal activity or to do active work. 60   - Requires occasional assistance, but is able to care for most of his personal needs. 50   - Requires considerable assistance and frequent medical care. 36   - Disabled; requires special care and assistance. 61   - Severely disabled; hospital admission is indicated although  death not imminent. 85   - Very sick; hospital admission necessary; active supportive treatment necessary. 10   - Moribund; fatal processes progressing rapidly. 0     - Dead  Karnofsky DA, Abelmann [place], Craver LS and Burchenal [facility] [phone]) The use of the nitrogen mustards in the palliative treatment of carcinoma: with particular reference to bronchogenic carcinoma Cancer 1 [id number]    LABORATORY DATA:  Lab Results  Component Value Date   WBC 4.9 07/03/2018   HGB 13.8 07/03/2018   HCT 39.6 07/03/2018   MCV 101.3 (H) 07/03/2018   PLT 46 (L) 07/03/2018   CMP     Component Value Date/Time   NA 137 07/03/2018 0620   K 4.2 07/03/2018 0620   CL 102 07/03/2018 0620   CO2 27 07/03/2018 0620   GLUCOSE 108 (H) 07/03/2018 0620   BUN 22 07/03/2018  7824   CREATININE 0.52 07/03/2018 0620   CALCIUM 8.9 07/03/2018 0620   PROT 5.5 (L) 07/03/2018 0620   PROT 6.8 09/05/2016 1410   ALBUMIN 3.4 (L) 07/03/2018 0620   ALBUMIN 4.5 09/05/2016 1410   AST 30 07/03/2018 0620   ALT 44 07/03/2018 0620   ALKPHOS 52 07/03/2018 0620   BILITOT 0.9 07/03/2018 0620   BILITOT 1.0 09/05/2016 1410   GFRNONAA >60 07/03/2018 0620   GFRAA >60 07/03/2018 2353         RADIOGRAPHY: Mr Brain W Wo Contrast  Result Date: 07/05/2018 CLINICAL DATA:  Follow-up glioblastoma multiforme, status post biopsy. EXAM: MRI HEAD WITHOUT AND WITH CONTRAST TECHNIQUE: Multiplanar, multiecho pulse sequences of the brain and surrounding structures were obtained without and with intravenous contrast. CONTRAST:  36m MULTIHANCE GADOBENATE DIMEGLUMINE 529 MG/ML IV SOLN COMPARISON:  MRI of the head May 20, 2018 FINDINGS: INTRACRANIAL CONTENTS: Heterogeneously reduced diffusion of LEFT parietal lobe masses with heterogeneous enhancement. Bridging tumor spanning the 2 prior nodules in toto now measuring 1.8 x 2.2 x 3.6 cm. At least 4 new surrounding enhancing satellite nodules measuring 5 mm or less within 1 cm dominant tumor decreased Peri  tumoral vasogenic edema versus decreasing and nonenhancing infiltrative tumor. Increasing subependymal enhancement. No suspicious distal enhancement. Small amount of susceptibility artifact along LEFT parietal biopsy approach in within the tumor. Again noted is susceptibility artifact RIGHT central sulcus. No significant mass effect. No midline shift. Re-expanded LEFT lateral ventricle. Moderate general parenchymal brain volume loss. Patchy supratentorial white matter FLAIR T2 hyperintensities exclusive aforementioned abnormality compatible with mild-to-moderate chronic small vessel ischemic changes. No abnormal extra-axial fluid collections. VASCULAR: Normal major intracranial vascular flow voids present at skull base. SKULL AND UPPER CERVICAL SPINE: No abnormal sellar expansion. No suspicious calvarial bone marrow signal. New LEFT parietal burr hole. Craniocervical junction maintained. SINUSES/ORBITS: The mastoid air-cells and included paranasal sinuses are well-aerated.The included ocular globes and orbital contents are non-suspicious. Status post bilateral ocular lens implants. OTHER: None. IMPRESSION: 1. Interval disease progression; bridging tumor with single LEFT parietal 1.8 x 2.2 x 3.6 cm GBM and at least 4 new marginal satellite nodules. However, decreased edema/nonenhancing tumor. Status post biopsy. 2. RIGHT central sulcus superficial siderosis. Electronically Signed   By: CElon AlasM.D.   On: 07/05/2018 14:59      IMPRESSION/PLAN: Left Parietal Glioblastoma  I agree with [facility]'s recommendations to proceed with a hypofractionated course of radiotherapy for this patient. We discussed that IMRT treatment would be directed to the tumor with margin and would take 3 weeks to complete.  It was a pleasure meeting the patient today. We discussed the risks, benefits, and side effects of radiotherapy. Acute effects include patchy hair loss, fatigue, headaches, seizures, and nausea. Long-term effects  include cognitive changes and short-term memory loss. No guarantees of treatment were given. A consent form was signed and placed in the patient's medical record. The patient is enthusiastic about proceeding with treatment. I look forward to participating in the patient's care.  CT simulation/treatment planning will be scheduled for tomorrow 07/18/18 at 3:00 PM. Anticipate treatments to begin next week.   Advised patient to continue her physical therapy.  Patient will continue to follow with Dr VMickeal Skinneras well.  __________________________________________   SEppie Gibson MD  This document serves as a record of services personally performed by SEppie Gibson MD. It was created on her behalf by HRae Lips a trained medical scribe. The creation of this record is based on the scribe's  personal observations and the provider's statements to them. This document has been checked and approved by the attending provider.

## 2018-07-18 ENCOUNTER — Ambulatory Visit
Admission: RE | Admit: 2018-07-18 | Discharge: 2018-07-18 | Disposition: A | Discharge status: Home / Self Care | Payer: Medicare Other | Source: Ambulatory Visit | Attending: Radiation Oncology | Admitting: Radiation Oncology

## 2018-07-18 DIAGNOSIS — R600 Localized edema: Secondary | ICD-10-CM | POA: Diagnosis not present

## 2018-07-18 DIAGNOSIS — C713 Malignant neoplasm of parietal lobe: Secondary | ICD-10-CM

## 2018-07-18 DIAGNOSIS — G8191 Hemiplegia, unspecified affecting right dominant side: Secondary | ICD-10-CM | POA: Diagnosis not present

## 2018-07-18 DIAGNOSIS — Z510 Encounter for antineoplastic radiation therapy: Secondary | ICD-10-CM | POA: Diagnosis not present

## 2018-07-18 DIAGNOSIS — Z789 Other specified health status: Secondary | ICD-10-CM | POA: Diagnosis not present

## 2018-07-18 NOTE — Progress Notes (Addendum)
Isabel Hernandez [id number] 10-01-1941   SIMULATION AND TREATMENT PLANNING NOTE  OUTPATIENT  DIAGNOSIS:    ICD-10-CM   1. Glioblastoma of parietal lobe ([place]) C71.3     NARRATIVE:  The patient was brought to the [facility].  Identity was confirmed.  All relevant records and images related to the planned course of therapy were reviewed.  The patient freely provided informed written consent to proceed with treatment after reviewing the details related to the planned course of therapy. The consent form was witnessed and verified by the simulation staff.    Then, the patient was set-up in a stable reproducible  supine position for radiation therapy. Aquaplast head mask was made for immobilization.  CT images were obtained.  Surface markings were placed.  The CT images were loaded into the planning software.  MRI fusion occurred to the CT.  TREATMENT PLANNING NOTE: Treatment planning then occurred.  The radiation prescription was entered and confirmed.    A total of 1 medically necessary complex treatment devices were fabricated and supervised by me, in the form of Aquaplast mask.   MULTIPLE FIELDS WITH MLCs MAY DESIGNED IN DOSIMETRY for dose homogeneity.  I have requested : Intensity Modulated Radiotherapy (IMRT) is medically necessary for this case for the following reason:  Critical CNS structure avoidance - brainstem, optic chiasm, optic nerves,  brain.  The patient will receive 40 Gy in 15 fractions to the FLAIR abnormality in the left parietal brain with margin, with palliative intent.   -----------------------------------  Eppie Gibson, MD

## 2018-07-19 NOTE — Progress Notes (Signed)
Brain and Spine Tumor Board Documentation  Mahrosh Kisamore was presented by Cecil Cobbs, MD at Brain and Spine Tumor Board on 07/19/2018, which included representatives from neuro oncology, radiation oncology, surgical oncology, navigation, pathology, radiology.  Tyniah was presented as a new patient with history of the following treatments:  .  Additionally, we reviewed previous medical and familial history, history of present illness, and recent lab results along with all available histopathologic and imaging studies. The tumor board considered available treatment options and made the following recommendations:  Radiation therapy (primary modality) 15 days IMRT  Tumor board is a meeting of clinicians from various specialty areas who evaluate and discuss patients for whom a multidisciplinary approach is being considered. Final determinations in the plan of care are those of the provider(s). The responsibility for follow up of recommendations given during tumor board is that of the provider.   Today's extended care, comprehensive team conference, Kristiana was not present for the discussion and was not examined.

## 2018-07-20 ENCOUNTER — Other Ambulatory Visit: Payer: Self-pay | Admitting: Radiation Oncology

## 2018-07-20 DIAGNOSIS — C713 Malignant neoplasm of parietal lobe: Secondary | ICD-10-CM

## 2018-07-20 MED ORDER — LORAZEPAM 0.5 MG PO TABS: ORAL_TABLET | ORAL | 0 refills | Status: DC

## 2018-07-23 ENCOUNTER — Ambulatory Visit
Admission: RE | Admit: 2018-07-23 | Discharge: 2018-07-23 | Disposition: A | Discharge status: Home / Self Care | Payer: Medicare Other | Source: Ambulatory Visit | Attending: Radiation Oncology | Admitting: Radiation Oncology

## 2018-07-23 DIAGNOSIS — Z510 Encounter for antineoplastic radiation therapy: Secondary | ICD-10-CM | POA: Diagnosis not present

## 2018-07-23 DIAGNOSIS — C713 Malignant neoplasm of parietal lobe: Secondary | ICD-10-CM | POA: Diagnosis not present

## 2018-07-23 MED ORDER — SONAFINE EX EMUL
1.0000 "application " | Freq: Once | CUTANEOUS | Status: AC
  Administered 2018-07-23: 1 via TOPICAL

## 2018-07-23 NOTE — Progress Notes (Signed)
Pt here for patient teaching.  Pt given Radiation and You booklet, skin care instructions and Sonafine.  Reviewed areas of pertinence such as fatigue, hair loss, skin changes and blurry vision . Pt able to give teach back of to pat skin, use unscented/gentle soap and drink plenty of water,apply Sonafine bid, avoid applying anything to skin within 4 hours of treatment and to use an electric razor if they must shave. Pt verbalizes understanding of information given and will contact nursing with any questions or concerns.     [email or web address]

## 2018-07-24 ENCOUNTER — Ambulatory Visit
Admission: RE | Admit: 2018-07-24 | Discharge: 2018-07-24 | Disposition: A | Discharge status: Home / Self Care | Payer: Medicare Other | Source: Ambulatory Visit | Attending: Radiation Oncology | Admitting: Radiation Oncology

## 2018-07-24 DIAGNOSIS — Z510 Encounter for antineoplastic radiation therapy: Secondary | ICD-10-CM | POA: Diagnosis not present

## 2018-07-24 DIAGNOSIS — C713 Malignant neoplasm of parietal lobe: Secondary | ICD-10-CM | POA: Diagnosis not present

## 2018-07-25 ENCOUNTER — Ambulatory Visit
Admission: RE | Admit: 2018-07-25 | Discharge: 2018-07-25 | Disposition: A | Discharge status: Home / Self Care | Payer: Medicare Other | Source: Ambulatory Visit | Attending: Radiation Oncology | Admitting: Radiation Oncology

## 2018-07-25 ENCOUNTER — Other Ambulatory Visit
Admission: RE | Admit: 2018-07-25 | Discharge: 2018-07-25 | Disposition: A | Discharge status: Home / Self Care | Payer: Medicare Other | Source: Ambulatory Visit | Attending: Internal Medicine | Admitting: Internal Medicine

## 2018-07-25 DIAGNOSIS — Z510 Encounter for antineoplastic radiation therapy: Secondary | ICD-10-CM | POA: Diagnosis not present

## 2018-07-25 DIAGNOSIS — Z9221 Personal history of antineoplastic chemotherapy: Secondary | ICD-10-CM | POA: Insufficient documentation

## 2018-07-25 DIAGNOSIS — C713 Malignant neoplasm of parietal lobe: Secondary | ICD-10-CM | POA: Diagnosis not present

## 2018-07-25 LAB — BASIC METABOLIC PANEL
ANION GAP: 9 (ref 5–15)
BUN: 25 mg/dL — AB (ref 8–23)
CO2: 29 mmol/L (ref 22–32)
Calcium: 8.9 mg/dL (ref 8.9–10.3)
Chloride: 100 mmol/L (ref 98–111)
Creatinine, Ser: 0.79 mg/dL (ref 0.44–1.00)
GFR calc non Af Amer: >60 mL/min (ref >60)
Glucose, Bld: 139 mg/dL — ABNORMAL HIGH (ref 70–99)
POTASSIUM: 3.8 mmol/L (ref 3.5–5.1)
SODIUM: 138 mmol/L (ref 135–145)

## 2018-07-26 ENCOUNTER — Ambulatory Visit
Admission: RE | Admit: 2018-07-26 | Discharge: 2018-07-26 | Disposition: A | Discharge status: Home / Self Care | Payer: Medicare Other | Source: Ambulatory Visit | Attending: Radiation Oncology | Admitting: Radiation Oncology

## 2018-07-26 DIAGNOSIS — Z510 Encounter for antineoplastic radiation therapy: Secondary | ICD-10-CM | POA: Diagnosis not present

## 2018-07-26 DIAGNOSIS — C713 Malignant neoplasm of parietal lobe: Secondary | ICD-10-CM | POA: Diagnosis not present

## 2018-07-27 ENCOUNTER — Ambulatory Visit
Admission: RE | Admit: 2018-07-27 | Discharge: 2018-07-27 | Disposition: A | Discharge status: Home / Self Care | Payer: Medicare Other | Source: Ambulatory Visit | Attending: Radiation Oncology | Admitting: Radiation Oncology

## 2018-07-27 DIAGNOSIS — C713 Malignant neoplasm of parietal lobe: Secondary | ICD-10-CM | POA: Diagnosis not present

## 2018-07-27 DIAGNOSIS — Z510 Encounter for antineoplastic radiation therapy: Secondary | ICD-10-CM | POA: Diagnosis not present

## 2018-07-30 ENCOUNTER — Inpatient Hospital Stay: Payer: No Typology Code available for payment source

## 2018-07-30 ENCOUNTER — Other Ambulatory Visit: Payer: Self-pay | Admitting: *Deleted

## 2018-07-30 ENCOUNTER — Ambulatory Visit
Admission: RE | Admit: 2018-07-30 | Discharge: 2018-07-30 | Disposition: A | Discharge status: Home / Self Care | Payer: Medicare Other | Source: Ambulatory Visit | Attending: Radiation Oncology | Admitting: Radiation Oncology

## 2018-07-30 ENCOUNTER — Telehealth: Payer: Self-pay

## 2018-07-30 ENCOUNTER — Inpatient Hospital Stay (HOSPITAL_BASED_OUTPATIENT_CLINIC_OR_DEPARTMENT_OTHER): Payer: No Typology Code available for payment source | Admitting: Internal Medicine

## 2018-07-30 ENCOUNTER — Encounter: Payer: Self-pay | Admitting: Internal Medicine

## 2018-07-30 ENCOUNTER — Other Ambulatory Visit: Payer: Self-pay | Admitting: Radiation Oncology

## 2018-07-30 ENCOUNTER — Other Ambulatory Visit: Payer: Self-pay | Admitting: Internal Medicine

## 2018-07-30 ENCOUNTER — Other Ambulatory Visit: Payer: Self-pay

## 2018-07-30 VITALS — BP 121/76 | HR 69 | Temp 98.6°F | Resp 18 | Ht 64.0 in

## 2018-07-30 DIAGNOSIS — I2510 Atherosclerotic heart disease of native coronary artery without angina pectoris: Secondary | ICD-10-CM | POA: Diagnosis not present

## 2018-07-30 DIAGNOSIS — Z86010 Personal history of colonic polyps: Secondary | ICD-10-CM

## 2018-07-30 DIAGNOSIS — E785 Hyperlipidemia, unspecified: Secondary | ICD-10-CM

## 2018-07-30 DIAGNOSIS — Z510 Encounter for antineoplastic radiation therapy: Secondary | ICD-10-CM | POA: Diagnosis not present

## 2018-07-30 DIAGNOSIS — Z9181 History of falling: Secondary | ICD-10-CM

## 2018-07-30 DIAGNOSIS — Z87891 Personal history of nicotine dependence: Secondary | ICD-10-CM

## 2018-07-30 DIAGNOSIS — D696 Thrombocytopenia, unspecified: Secondary | ICD-10-CM | POA: Diagnosis not present

## 2018-07-30 DIAGNOSIS — C713 Malignant neoplasm of parietal lobe: Secondary | ICD-10-CM

## 2018-07-30 DIAGNOSIS — I4891 Unspecified atrial fibrillation: Secondary | ICD-10-CM | POA: Diagnosis not present

## 2018-07-30 DIAGNOSIS — K219 Gastro-esophageal reflux disease without esophagitis: Secondary | ICD-10-CM | POA: Diagnosis not present

## 2018-07-30 DIAGNOSIS — R569 Unspecified convulsions: Secondary | ICD-10-CM | POA: Diagnosis not present

## 2018-07-30 DIAGNOSIS — K449 Diaphragmatic hernia without obstruction or gangrene: Secondary | ICD-10-CM

## 2018-07-30 DIAGNOSIS — I10 Essential (primary) hypertension: Secondary | ICD-10-CM | POA: Diagnosis not present

## 2018-07-30 DIAGNOSIS — K589 Irritable bowel syndrome without diarrhea: Secondary | ICD-10-CM | POA: Diagnosis not present

## 2018-07-30 DIAGNOSIS — Z7901 Long term (current) use of anticoagulants: Secondary | ICD-10-CM

## 2018-07-30 DIAGNOSIS — Z801 Family history of malignant neoplasm of trachea, bronchus and lung: Secondary | ICD-10-CM

## 2018-07-30 DIAGNOSIS — M129 Arthropathy, unspecified: Secondary | ICD-10-CM | POA: Diagnosis not present

## 2018-07-30 DIAGNOSIS — Z79899 Other long term (current) drug therapy: Secondary | ICD-10-CM

## 2018-07-30 LAB — CMP (CANCER CENTER ONLY)
ALBUMIN: 3.4 g/dL — AB (ref 3.5–5.0)
ALT: 38 U/L (ref 0–44)
AST: 25 U/L (ref 15–41)
Alkaline Phosphatase: 46 U/L (ref 38–126)
Anion gap: 9 (ref 5–15)
BUN: 26 mg/dL — AB (ref 8–23)
CALCIUM: 9.6 mg/dL (ref 8.9–10.3)
CO2: 29 mmol/L (ref 22–32)
CREATININE: 0.71 mg/dL (ref 0.44–1.00)
Chloride: 101 mmol/L (ref 98–111)
GFR, Est AFR Am: >60 mL/min (ref >60)
GFR, Estimated: >60 mL/min (ref >60)
GLUCOSE: 106 mg/dL — AB (ref 70–99)
POTASSIUM: 3.9 mmol/L (ref 3.5–5.1)
SODIUM: 139 mmol/L (ref 135–145)
Total Bilirubin: 0.5 mg/dL (ref 0.3–1.2)
Total Protein: 6.3 g/dL — ABNORMAL LOW (ref 6.5–8.1)

## 2018-07-30 LAB — CBC WITH DIFFERENTIAL (CANCER CENTER ONLY)
Abs Immature Granulocytes: 0.19 10*3/uL — ABNORMAL HIGH (ref 0.00–0.07)
BASOS ABS: 0 10*3/uL (ref 0.0–0.1)
BASOS PCT: 0 %
EOS ABS: 0.1 10*3/uL (ref 0.0–0.5)
Eosinophils Relative: 1 %
HEMATOCRIT: 40.7 % (ref 36.0–46.0)
Hemoglobin: 13.8 g/dL (ref 12.0–15.0)
IMMATURE GRANULOCYTES: 2 %
LYMPHS ABS: 1.3 10*3/uL (ref 0.7–4.0)
Lymphocytes Relative: 14 %
MCH: 32.7 pg (ref 26.0–34.0)
MCHC: 33.9 g/dL (ref 30.0–36.0)
MCV: 96.4 fL (ref 80.0–100.0)
Monocytes Absolute: 0.9 10*3/uL (ref 0.1–1.0)
Monocytes Relative: 10 %
NEUTROS PCT: 73 %
NRBC: 0 % (ref 0.0–0.2)
Neutro Abs: 6.6 10*3/uL (ref 1.7–7.7)
PLATELETS: 120 10*3/uL — AB (ref 150–400)
RBC: 4.22 MIL/uL (ref 3.87–5.11)
RDW: 16.1 % — AB (ref 11.5–15.5)
WBC Count: 9.1 10*3/uL (ref 4.0–10.5)

## 2018-07-30 MED ORDER — DEXAMETHASONE 2 MG PO TABS: 2.0000 mg | ORAL_TABLET | Freq: Every day | ORAL | 3 refills | Status: DC

## 2018-07-30 MED ORDER — LORAZEPAM 1 MG PO TABS: ORAL_TABLET | ORAL | 0 refills | Status: DC

## 2018-07-30 NOTE — Progress Notes (Signed)
[facility] [place], [place] [address] [phone]   Interval Evaluation  Date of Service: 07/30/18 Patient Name: Isabel Hernandez Patient MRN: [id number] Patient DOB: [birth year] Provider: Ventura Sellers, MD  Identifying Statement:  Lesa Vandall is a 77 y.o. female with left parietal glioblastoma   Oncologic History:  05/31/2018: Left frontal parietal stereotactic brain biopsy by Dr. Cathleen Fears. 07/09/2018: Status post 1 cycle of Temodar with disease progression. Recommend she proceed with radiation, 3 week course   Biomarkers:  MGMT Unknown.  IDH 1/2 Wild type.  EGFR Unknown  TERT Unknown   Interval History:  Lynsi Finks presents today for follow up, now in week 2 of 3 of radiation therapy.  She continues on 18m BID decadron, Vimpat, Keppra, and Eliquis without any new issue or complaint.  She denies headaches or seizures.  Does have mild persistent fatigue.  Medications: Current Outpatient Medications on File Prior to Visit  Medication Sig Dispense Refill  . acetaminophen (TYLENOL) 325 MG tablet Take 975 mg by mouth every 6 (six) hours as needed.    .Marland KitchenamLODipine (NORVASC) 5 MG tablet Take 2.5 mg by mouth daily.    .Marland Kitchenapixaban (ELIQUIS) 2.5 MG TABS tablet Take 2.5 mg by mouth 2 (two) times daily.    .Marland Kitchenatorvastatin (LIPITOR) 40 MG tablet Take 40 mg by mouth daily.    . clonazePAM (KLONOPIN) 0.25 MG disintegrating tablet Take 1 tablet (0.25 mg total) by mouth 2 (two) times daily. 60 tablet 0  . dexamethasone (DECADRON) 2 MG tablet Take 2 mg by mouth 2 (two) times daily with a meal.    . famotidine (PEPCID) 20 MG tablet Take 20 mg by mouth 2 (two) times daily as needed.    . fenofibrate (TRICOR) 145 MG tablet Take 145 mg by mouth daily.    . Lacosamide (VIMPAT) 100 MG TABS Take 1 tablet (100 mg total) by mouth every 12 (twelve) hours. 60 tablet 0  . levETIRAcetam (KEPPRA) 750 MG tablet Take 1,500 mg by mouth 2 (two) times daily.     .Marland KitchenLORazepam (ATIVAN) 0.5 MG tablet Take 1 tablet PO 20-30 minutes before radiotherapy prn anxiety. 15 tablet 0  . magnesium oxide (MAG-OX) 400 MG tablet Take 400 mg by mouth 2 (two) times daily.    . metoprolol tartrate (LOPRESSOR) 25 MG tablet Take 0.5 tablets (12.5 mg total) by mouth 2 (two) times daily. 5 tablet 0  . mirtazapine (REMERON) 15 MG tablet Take 7.5 mg by mouth at bedtime. 1/2 tab    . Multiple Vitamin (MULTIVITAMIN WITH MINERALS) TABS tablet Take 1 tablet by mouth daily.    . NON FORMULARY Diet Type: Regular    . ondansetron (ZOFRAN) 8 MG tablet Take 8 mg by mouth every 8 (eight) hours as needed for nausea.    . polyethylene glycol (MIRALAX / GLYCOLAX) packet Take 17 g by mouth daily.    . traMADol (ULTRAM) 50 MG tablet Take 0.5 tablets (25 mg total) by mouth every 6 (six) hours as needed. If pain not relieved by tylenol 60 tablet 0   No current facility-administered medications on file prior to visit.     Allergies:  Allergies  Allergen Reactions  . Nitroglycerin Other (See Comments)    Can only use the patch  Turned lobster red and felt faint with sublingual NTG  . Penicillins Hives    Hives Has patient had a PCN reaction causing immediate rash, facial/tongue/throat swelling,  SOB or lightheadedness with hypotension: YES Has patient had a PCN reaction causing severe rash involving mucus membranes or skin necrosis: NO Has patient had a PCN reaction that required hospitalization NO Has patient had a PCN reaction occurring within the last 10 years: No If all of the above answers are "NO", then may proceed with Cephalosporin use.  . Biaxin [Clarithromycin] Rash    Rash   . Latex Rash  . Lidocaine Swelling    Eye swelling Tolerates Tetracaine with epidural steroid injections (04/10/17)  . Nickel Rash  . Oxycodone Hives  . Tape Rash  . Tetracyclines & Related Rash       . Tizanidine Rash   Past Medical History:  Past Medical History:  Diagnosis Date  . A-fib ([place])    . Arthritis   . Breast cancer ([place]) 2002   RT LUMPECTOMY  . Bronchitis   . Carotid artery narrowings   . 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 Carotid artery occlusion    RIGHT 50% BLOCKAGE  . Colon polyps   . Complication of anesthesia    shaking/ FOLLOWING LOCAL AT DENTIST , drop O2 sats TO 50% DURING COLONOSCOPY  . Coronary artery disease   . Diverticulosis   . Dysrhythmia   . Environmental allergies   . GERD (gastroesophageal reflux disease)   . History of hiatal hernia   . Hyperlipidemia   . Hypertension   . IBS (irritable bowel syndrome)   . Mitral valve disorder   . Shortness of breath dyspnea   . Syncope and collapse   . UTI (lower urinary tract infection)    Past Surgical History:  Past Surgical History:  Procedure Laterality Date  . APPENDECTOMY    . BREAST BIOPSY Right 2002   POS  . BREAST CYST ASPIRATION Right   . BREAST EXCISIONAL BIOPSY Left 1997   NEG  . BREAST LUMPECTOMY    . CATARACT EXTRACTION W/PHACO Right 06/07/2016   Procedure: CATARACT EXTRACTION PHACO AND INTRAOCULAR LENS PLACEMENT (IOC);  Surgeon: Birder Robson, MD;  Location: [facility] ORS;  Service: Ophthalmology;  Laterality: Right;  [place] 00:51 AP% 19.6 CDE 10.09 Fluid pack lot # [id number]  . CATARACT EXTRACTION W/PHACO Left 07/05/2016   Procedure: CATARACT EXTRACTION PHACO AND INTRAOCULAR LENS PLACEMENT (IOC);  Surgeon: Birder Robson, MD;  Location: [facility] ORS;  Service: Ophthalmology;  Laterality: Left;  [place] 00:38 AP% 24.9 CDE 9.60 Fluid Pack lot # [id number]  . CESAREAN SECTION    . TONSILLECTOMY    . TOTAL KNEE ARTHROPLASTY Left    Social History:  Social History   Socioeconomic History  . Marital status: Married    Spouse name: Not on file  . Number of children: Not on file  . Years of education: Not on file  . Highest education level: Not on file  Occupational History  . Occupation: retired    Comment: Nurse, adult  . Financial resource strain: Not on file  .  Food insecurity:    Worry: Not on file    Inability: Not on file  . Transportation needs:    Medical: Yes    Non-medical: Yes  Tobacco Use  . Smoking status: Former Smoker    Years: 10.00    Types: Cigarettes    Last attempt to quit: 10/03/1969    Years since quitting: 48.8  . Smokeless tobacco: Never Used  Substance and Sexual Activity  . Alcohol use: Not Currently    Alcohol/week: 0.0 standard  drinks  . Drug use: No  . Sexual activity: Not on file  Lifestyle  . Physical activity:    Days per week: Not on file    Minutes per session: Not on file  . Stress: Not on file  Relationships  . Social connections:    Talks on phone: Not on file    Gets together: Not on file    Attends religious service: Not on file    Active member of club or organization: Not on file    Attends meetings of clubs or organizations: Not on file    Relationship status: Not on file  . Intimate partner violence:    Fear of current or ex partner: No    Emotionally abused: No    Physically abused: No    Forced sexual activity: No  Other Topics Concern  . Not on file  Social History Narrative  . Not on file   Family History:  Family History  Problem Relation Age of Onset  . Lung cancer Father   . Prostate cancer Father   . Hyperlipidemia Brother   . Hypertension Brother   . Obesity Brother   . Heart disease Mother   . Lung cancer Sister   . Kidney disease Unknown        Maternal grandparent  . Breast cancer Neg Hx     Review of Systems: Constitutional: Denies fevers, chills or abnormal weight loss Eyes: Denies blurriness of vision Ears, nose, mouth, throat, and face: Denies mucositis or sore throat Respiratory: Denies cough, dyspnea or wheezes Cardiovascular: Denies palpitation, chest discomfort or lower extremity swelling Gastrointestinal:  Denies nausea, constipation, diarrhea GU: Denies dysuria or incontinence Skin: Denies abnormal skin rashes Neurological: Per HPI Musculoskeletal:  Denies joint pain, back or neck discomfort. No decrease in ROM Behavioral/Psych: Denies anxiety, disturbance in thought content, and mood instability  Physical Exam: Vitals:   07/30/18 1149  BP: 121/76  Pulse: 69  Resp: 18  Temp: 98.6 F (37 C)  SpO2: 97%   KPS: 60. General: Alert, cooperative, pleasant, in no acute distress in wheelchair Head: Normal EENT: No conjunctival injection or scleral icterus. Oral mucosa moist Lungs: Resp effort normal Cardiac: Regular rate and rhythm Abdomen: Soft, non-distended abdomen Skin: No rashes cyanosis or petechiae. Extremities: ++edema right leg which is in brace  Neurologic Exam: Mental Status: Awake, alert, attentive to examiner. Oriented to self and environment. Language is fluent with intact comprehension.  Cranial Nerves: Visual acuity is grossly normal. Visual fields are full. Extra-ocular movements intact. No ptosis. Face is symmetric, tongue midline. Motor: Tone and bulk are normal. Power is impaired in right leg only, 0/5. Reflexes are symmetric, no pathologic reflexes present. Intact finger to nose bilaterally Sensory: Intact to light touch and temperature Gait: Deferred   Labs: I have reviewed the data as listed    Component Value Date/Time   NA 139 07/30/2018 1111   K 3.9 07/30/2018 1111   CL 101 07/30/2018 1111   CO2 29 07/30/2018 1111   GLUCOSE 106 (H) 07/30/2018 1111   BUN 26 (H) 07/30/2018 1111   CREATININE 0.71 07/30/2018 1111   CALCIUM 9.6 07/30/2018 1111   PROT 6.3 (L) 07/30/2018 1111   PROT 6.8 09/05/2016 1410   ALBUMIN 3.4 (L) 07/30/2018 1111   ALBUMIN 4.5 09/05/2016 1410   AST 25 07/30/2018 1111   ALT 38 07/30/2018 1111   ALKPHOS 46 07/30/2018 1111   BILITOT 0.5 07/30/2018 1111   GFRNONAA >60 07/30/2018 1111  GFRAA >60 07/30/2018 1111   Lab Results  Component Value Date   WBC 9.1 07/30/2018   NEUTROABS 6.6 07/30/2018   HGB 13.8 07/30/2018   HCT 40.7 07/30/2018   MCV 96.4 07/30/2018   PLT 120 (L)  07/30/2018     Assessment/Plan 1. Glioblastoma of parietal lobe ([place])  Ms. Bear is clinically stable today.  She is tolerating radiation well without complication.  We recommended decreasing decadron to 61m daily if tolerated.  She will call if there is recurrence of focal symptoms or systemic decline with the decreased dose.  We appreciate the opportunity to participate in the care of [place].   We will schedule MRI brain for Thursday, December 5th.  This will be followed by [facility]  She should return to our clinic on 12/10 for further evaluation and implementation of Dr. APricilla Lovelessplan.  All questions were answered. The patient knows to call the clinic with any problems, questions or concerns. No barriers to learning were detected.  The total time spent in the encounter was 25 minutes and more than 50% was on counseling and review of test results   ZVentura Sellers MD Medical Director of Neuro-Oncology [facility]at WL[facility] Park10/28/19 12:31 PM

## 2018-07-30 NOTE — Telephone Encounter (Signed)
Printed avs and calender of upcoming appointment. Per 10/28 los 

## 2018-07-31 ENCOUNTER — Ambulatory Visit
Admission: RE | Admit: 2018-07-31 | Discharge: 2018-07-31 | Disposition: A | Discharge status: Home / Self Care | Payer: Medicare Other | Source: Ambulatory Visit | Attending: Radiation Oncology | Admitting: Radiation Oncology

## 2018-07-31 DIAGNOSIS — C713 Malignant neoplasm of parietal lobe: Secondary | ICD-10-CM | POA: Diagnosis not present

## 2018-07-31 DIAGNOSIS — Z510 Encounter for antineoplastic radiation therapy: Secondary | ICD-10-CM | POA: Diagnosis not present

## 2018-08-01 ENCOUNTER — Ambulatory Visit
Admission: RE | Admit: 2018-08-01 | Discharge: 2018-08-01 | Disposition: A | Discharge status: Home / Self Care | Payer: Medicare Other | Source: Ambulatory Visit | Attending: Radiation Oncology | Admitting: Radiation Oncology

## 2018-08-01 DIAGNOSIS — C713 Malignant neoplasm of parietal lobe: Secondary | ICD-10-CM | POA: Diagnosis not present

## 2018-08-01 DIAGNOSIS — Z510 Encounter for antineoplastic radiation therapy: Secondary | ICD-10-CM | POA: Diagnosis not present

## 2018-08-02 ENCOUNTER — Ambulatory Visit
Admission: RE | Admit: 2018-08-02 | Discharge: 2018-08-02 | Disposition: A | Discharge status: Home / Self Care | Payer: Medicare Other | Source: Ambulatory Visit | Attending: Radiation Oncology | Admitting: Radiation Oncology

## 2018-08-02 DIAGNOSIS — C713 Malignant neoplasm of parietal lobe: Secondary | ICD-10-CM | POA: Diagnosis not present

## 2018-08-02 DIAGNOSIS — Z510 Encounter for antineoplastic radiation therapy: Secondary | ICD-10-CM | POA: Diagnosis not present

## 2018-08-03 ENCOUNTER — Ambulatory Visit
Admission: RE | Admit: 2018-08-03 | Discharge: 2018-08-03 | Disposition: A | Discharge status: Home / Self Care | Payer: Medicare Other | Source: Ambulatory Visit | Attending: Radiation Oncology | Admitting: Radiation Oncology

## 2018-08-03 ENCOUNTER — Encounter
Admission: RE | Admit: 2018-08-03 | Discharge: 2018-08-03 | Disposition: A | Discharge status: Home / Self Care | Payer: Medicare Other | Source: Ambulatory Visit | Attending: Internal Medicine | Admitting: Internal Medicine

## 2018-08-03 DIAGNOSIS — C713 Malignant neoplasm of parietal lobe: Secondary | ICD-10-CM | POA: Insufficient documentation

## 2018-08-03 DIAGNOSIS — Z510 Encounter for antineoplastic radiation therapy: Secondary | ICD-10-CM | POA: Diagnosis not present

## 2018-08-06 ENCOUNTER — Ambulatory Visit
Admission: RE | Admit: 2018-08-06 | Discharge: 2018-08-06 | Disposition: A | Discharge status: Home / Self Care | Payer: Medicare Other | Source: Ambulatory Visit | Attending: Radiation Oncology | Admitting: Radiation Oncology

## 2018-08-06 ENCOUNTER — Other Ambulatory Visit: Payer: Self-pay | Admitting: Radiation Oncology

## 2018-08-06 DIAGNOSIS — C713 Malignant neoplasm of parietal lobe: Secondary | ICD-10-CM | POA: Diagnosis not present

## 2018-08-06 DIAGNOSIS — Z510 Encounter for antineoplastic radiation therapy: Secondary | ICD-10-CM | POA: Diagnosis not present

## 2018-08-06 MED ORDER — TEMAZEPAM 15 MG PO CAPS: 15.0000 mg | ORAL_CAPSULE | Freq: Every evening | ORAL | 0 refills | Status: DC | PRN

## 2018-08-07 ENCOUNTER — Non-Acute Institutional Stay (SKILLED_NURSING_FACILITY): Payer: Medicare Other | Admitting: Adult Health

## 2018-08-07 ENCOUNTER — Ambulatory Visit
Admission: RE | Admit: 2018-08-07 | Discharge: 2018-08-07 | Disposition: A | Discharge status: Home / Self Care | Payer: Medicare Other | Source: Ambulatory Visit | Attending: Radiation Oncology | Admitting: Radiation Oncology

## 2018-08-07 ENCOUNTER — Other Ambulatory Visit
Admission: RE | Admit: 2018-08-07 | Discharge: 2018-08-07 | Disposition: A | Discharge status: Home / Self Care | Payer: Medicare Other | Source: Skilled Nursing Facility | Attending: Adult Health | Admitting: Adult Health

## 2018-08-07 ENCOUNTER — Encounter: Payer: Self-pay | Admitting: Adult Health

## 2018-08-07 DIAGNOSIS — Z510 Encounter for antineoplastic radiation therapy: Secondary | ICD-10-CM | POA: Diagnosis not present

## 2018-08-07 DIAGNOSIS — R0989 Other specified symptoms and signs involving the circulatory and respiratory systems: Secondary | ICD-10-CM | POA: Insufficient documentation

## 2018-08-07 DIAGNOSIS — J189 Pneumonia, unspecified organism: Secondary | ICD-10-CM

## 2018-08-07 DIAGNOSIS — I10 Essential (primary) hypertension: Secondary | ICD-10-CM | POA: Insufficient documentation

## 2018-08-07 DIAGNOSIS — C713 Malignant neoplasm of parietal lobe: Secondary | ICD-10-CM | POA: Diagnosis not present

## 2018-08-07 LAB — CBC WITH DIFFERENTIAL/PLATELET
Abs Immature Granulocytes: 0.07 10*3/uL (ref 0.00–0.07)
BASOS ABS: 0 10*3/uL (ref 0.0–0.1)
Basophils Relative: 1 %
EOS ABS: 0.1 10*3/uL (ref 0.0–0.5)
EOS PCT: 2 %
HCT: 37.2 % (ref 36.0–46.0)
HEMOGLOBIN: 12.7 g/dL (ref 12.0–15.0)
IMMATURE GRANULOCYTES: 2 %
LYMPHS PCT: 24 %
Lymphs Abs: 1.1 10*3/uL (ref 0.7–4.0)
MCH: 33.1 pg (ref 26.0–34.0)
MCHC: 34.1 g/dL (ref 30.0–36.0)
MCV: 96.9 fL (ref 80.0–100.0)
Monocytes Absolute: 0.7 10*3/uL (ref 0.1–1.0)
Monocytes Relative: 15 %
NEUTROS PCT: 56 %
NRBC: 0 % (ref 0.0–0.2)
Neutro Abs: 2.6 10*3/uL (ref 1.7–7.7)
Platelets: 142 10*3/uL — ABNORMAL LOW (ref 150–400)
RBC: 3.84 MIL/uL — AB (ref 3.87–5.11)
RDW: 16.8 % — AB (ref 11.5–15.5)
WBC: 4.5 10*3/uL (ref 4.0–10.5)

## 2018-08-07 LAB — COMPREHENSIVE METABOLIC PANEL
ALBUMIN: 3.4 g/dL — AB (ref 3.5–5.0)
ALT: 28 U/L (ref 0–44)
ANION GAP: 8 (ref 5–15)
AST: 33 U/L (ref 15–41)
Alkaline Phosphatase: 40 U/L (ref 38–126)
BUN: 21 mg/dL (ref 8–23)
CO2: 29 mmol/L (ref 22–32)
Calcium: 9.4 mg/dL (ref 8.9–10.3)
Chloride: 102 mmol/L (ref 98–111)
Creatinine, Ser: 0.69 mg/dL (ref 0.44–1.00)
GFR calc Af Amer: >60 mL/min (ref >60)
GFR calc non Af Amer: >60 mL/min (ref >60)
GLUCOSE: 112 mg/dL — AB (ref 70–99)
POTASSIUM: 3.9 mmol/L (ref 3.5–5.1)
SODIUM: 139 mmol/L (ref 135–145)
Total Bilirubin: 1 mg/dL (ref 0.3–1.2)
Total Protein: 6.3 g/dL — ABNORMAL LOW (ref 6.5–8.1)

## 2018-08-07 NOTE — Progress Notes (Signed)
Location:   [place] [facility] Room Number: [place] of Service:  SNF (31)   CODE STATUS: Full Code  Allergies  Allergen Reactions  . Nitroglycerin Other (See Comments)    Can only use the patch  Turned lobster red and felt faint with sublingual NTG  . Penicillins Hives    Hives Has patient had a PCN reaction causing immediate rash, facial/tongue/throat swelling, SOB or lightheadedness with hypotension: YES Has patient had a PCN reaction causing severe rash involving mucus membranes or skin necrosis: NO Has patient had a PCN reaction that required hospitalization NO Has patient had a PCN reaction occurring within the last 10 years: No If all of the above answers are "NO", then may proceed with Cephalosporin use.  . Biaxin [Clarithromycin] Rash    Rash   . Latex Rash  . Lidocaine Swelling    Eye swelling Tolerates Tetracaine with epidural steroid injections (04/10/17)  . Nickel Rash  . Oxycodone Hives  . Tape Rash  . Tetracyclines & Related Rash       . Tizanidine Rash    Chief Complaint  Patient presents with  . Acute Visit    Dizziness / Confusion    HPI:  Staff reports that she is confused; having shortness of breath with 02 sat 88% on room air. She is on 02 at 3L/[place]. She is confused and is unable to fully participate in the hpi or ros; but did tell me that she is feeling bad; is coughing and cannot batch her breath. There ear neo reports of fevers present.    Past Medical History:  Diagnosis Date  . A-fib ([place])   . Arthritis   . Breast cancer ([place]) 2002   RT LUMPECTOMY  . Bronchitis   . Carotid artery narrowings   . 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 Carotid artery occlusion    RIGHT 50% BLOCKAGE  . Colon polyps   . Complication of anesthesia    shaking/ FOLLOWING LOCAL AT DENTIST , drop O2 sats TO 50% DURING COLONOSCOPY  . Coronary artery disease   . Diverticulosis   . Dysrhythmia   . Environmental allergies   . GERD  (gastroesophageal reflux disease)   . History of hiatal hernia   . Hyperlipidemia   . Hypertension   . IBS (irritable bowel syndrome)   . Mitral valve disorder   . Shortness of breath dyspnea   . Syncope and collapse   . UTI (lower urinary tract infection)     Past Surgical History:  Procedure Laterality Date  . APPENDECTOMY    . BREAST BIOPSY Right 2002   POS  . BREAST CYST ASPIRATION Right   . BREAST EXCISIONAL BIOPSY Left 1997   NEG  . BREAST LUMPECTOMY    . CATARACT EXTRACTION W/PHACO Right 06/07/2016   Procedure: CATARACT EXTRACTION PHACO AND INTRAOCULAR LENS PLACEMENT (IOC);  Surgeon: Birder Robson, MD;  Location: [facility] ORS;  Service: Ophthalmology;  Laterality: Right;  [place] 00:51 AP% 19.6 CDE 10.09 Fluid pack lot # [id number]  . CATARACT EXTRACTION W/PHACO Left 07/05/2016   Procedure: CATARACT EXTRACTION PHACO AND INTRAOCULAR LENS PLACEMENT (IOC);  Surgeon: Birder Robson, MD;  Location: [facility] ORS;  Service: Ophthalmology;  Laterality: Left;  [place] 00:38 AP% 24.9 CDE 9.60 Fluid Pack lot # [id number]  . CESAREAN SECTION    . TONSILLECTOMY    . TOTAL KNEE ARTHROPLASTY Left     Social History   Socioeconomic History  .  Marital status: Married    Spouse name: Not on file  . Number of children: Not on file  . Years of education: Not on file  . Highest education level: Not on file  Occupational History  . Occupation: retired    Comment: Nurse, adult  . Financial resource strain: Not on file  . Food insecurity:    Worry: Not on file    Inability: Not on file  . Transportation needs:    Medical: Yes    Non-medical: Yes  Tobacco Use  . Smoking status: Former Smoker    Years: 10.00    Types: Cigarettes    Last attempt to quit: 10/03/1969    Years since quitting: 48.8  . Smokeless tobacco: Never Used  Substance and Sexual Activity  . Alcohol use: Not Currently    Alcohol/week: 0.0 standard drinks  . Drug use: No  . Sexual activity: Not on file    Lifestyle  . Physical activity:    Days per week: Not on file    Minutes per session: Not on file  . Stress: Not on file  Relationships  . Social connections:    Talks on phone: Not on file    Gets together: Not on file    Attends religious service: Not on file    Active member of club or organization: Not on file    Attends meetings of clubs or organizations: Not on file    Relationship status: Not on file  . Intimate partner violence:    Fear of current or ex partner: No    Emotionally abused: No    Physically abused: No    Forced sexual activity: No  Other Topics Concern  . Not on file  Social History Narrative  . Not on file   Family History  Problem Relation Age of Onset  . Lung cancer Father   . Prostate cancer Father   . Hyperlipidemia Brother   . Hypertension Brother   . Obesity Brother   . Heart disease Mother   . Lung cancer Sister   . Kidney disease Unknown        Maternal grandparent  . Breast cancer Neg Hx       VITAL SIGNS Ht 5\' 4"  (1.626 m)   Wt 168 lb 4.8 oz (76.3 kg)   BMI 28.89 kg/m   Outpatient Encounter Medications as of 08/07/2018  Medication Sig  . acetaminophen (TYLENOL) 325 MG tablet Take 975 mg by mouth every 8 (eight) hours as needed.   Marland Kitchen apixaban (ELIQUIS) 2.5 MG TABS tablet Take 2.5 mg by mouth 2 (two) times daily.  Marland Kitchen atorvastatin (LIPITOR) 40 MG tablet Take 40 mg by mouth daily.  . clonazePAM (KLONOPIN) 0.25 MG disintegrating tablet Take 1 tablet (0.25 mg total) by mouth 2 (two) times daily.  Marland Kitchen dexamethasone (DECADRON) 2 MG tablet Take 2 mg by mouth every 12 (twelve) hours.  . Dextromethorphan-guaiFENesin 5-100 MG/5ML LIQD Take 15 mLs by mouth 3 (three) times daily.  . famotidine (PEPCID) 20 MG tablet Take 20 mg by mouth 2 (two) times daily as needed.  . fenofibrate (TRICOR) 145 MG tablet Take 145 mg by mouth daily.  Marland Kitchen ipratropium (ATROVENT) 0.02 % nebulizer solution Inhale 1 vial via inhalation three times daily for SOB / Coughing   . Lacosamide (VIMPAT) 100 MG TABS Take 1 tablet (100 mg total) by mouth every 12 (twelve) hours.  . levETIRAcetam (KEPPRA) 750 MG tablet Take 1,500 mg by mouth 2 (  two) times daily.  Marland Kitchen LORazepam (ATIVAN) 1 MG tablet Take 1 tablet 20-30 min before radiotherapy, prn anxiety  . magnesium oxide (MAG-OX) 400 MG tablet Take 400 mg by mouth 2 (two) times daily.  . metoprolol tartrate (LOPRESSOR) 25 MG tablet Take 0.5 tablets (12.5 mg total) by mouth 2 (two) times daily.  . mineral oil (FLEET OIL) enema Give 1 enema rectally once daily every 3 days as needed if constipation not relieved by MOM or Bisacodyl suppository  . mirtazapine (REMERON) 15 MG tablet Take 7.5 mg by mouth at bedtime. 1/2 tab  . Multiple Vitamin (MULTIVITAMIN WITH MINERALS) TABS tablet Take 1 tablet by mouth daily.  . naphazoline-pheniramine (NAPHCON-A) 0.025-0.3 % ophthalmic solution Place 1 drop into both eyes 4 (four) times daily as needed for eye irritation.  . NON FORMULARY Diet Type: Regular  . ondansetron (ZOFRAN) 8 MG tablet Take 8 mg by mouth every 8 (eight) hours as needed for nausea.  . polyethylene glycol (MIRALAX / GLYCOLAX) packet Take 17 g by mouth daily.  . potassium chloride (K-DUR,KLOR-CON) 10 MEQ tablet Take 10 mEq by mouth daily.  Marland Kitchen torsemide (DEMADEX) 10 MG tablet Take 10 mg by mouth daily.  . traMADol (ULTRAM) 50 MG tablet Take 0.5 tablets (25 mg total) by mouth every 6 (six) hours as needed. If pain not relieved by tylenol  . WOUND DRESSINGS EX Triad wound dressing paste - Apply thin film topically two times daily to macerated buttocks  . [DISCONTINUED] amLODipine (NORVASC) 5 MG tablet Take 2.5 mg by mouth daily.  . [DISCONTINUED] dexamethasone (DECADRON) 2 MG tablet Take 1 tablet (2 mg total) by mouth daily. (Patient not taking: Reported on 08/07/2018)  . [DISCONTINUED] temazepam (RESTORIL) 15 MG capsule Take 1 capsule (15 mg total) by mouth at bedtime as needed for sleep. (Patient not taking: Reported on  08/07/2018)   No facility-administered encounter medications on file as of 08/07/2018.      SIGNIFICANT DIAGNOSTIC EXAMS  PREVIOUS:   06-06-18: wbc 7.0; hgb 13.6; hc6 39.5; mcv 96; plt 146 glucose 131; bun 13; creat 0.6; k+ 4.1; na++ 140; ca 9.5   NO NEW LABS  Review of Systems  Reason unable to perform ROS: confusion      Physical Exam  Constitutional: She appears well-developed and well-nourished. No distress.  Neck: No thyromegaly present.  Cardiovascular: Normal rate, regular rhythm, normal heart sounds and intact distal pulses.  Pulmonary/Chest: No respiratory distress. She has wheezes.  Increased effort 02 dependent Rhonchi present.   Abdominal: Soft. Bowel sounds are normal. She exhibits no distension. There is no tenderness.  Musculoskeletal: She exhibits no edema.  Right hemiparesis.  Splint right wrist and right lower leg     Lymphadenopathy:    She has no cervical adenopathy.  Neurological: She is alert.  Skin: Skin is warm and dry. She is not diaphoretic.  Is flushed   Psychiatric: She has a normal mood and affect.     ASSESSMENT/ PLAN:  TODAY:   1. Health care associated pneumonia : is worse will get a chest x-ray; will begin duoneb every 6 hours and will begin levaquin 750 mg daily through 08-21-18.       MD is aware of resident's narcotic use and is in agreement with current plan of care. We will attempt to wean resident as apropriate   Ok Edwards NP [facility]  Contact [phone] Monday through Friday 8am- 5pm  After hours call [phone]

## 2018-08-08 ENCOUNTER — Encounter: Payer: Self-pay | Admitting: Adult Health

## 2018-08-08 ENCOUNTER — Ambulatory Visit
Admission: RE | Admit: 2018-08-08 | Discharge: 2018-08-08 | Disposition: A | Discharge status: Home / Self Care | Payer: Medicare Other | Source: Ambulatory Visit | Attending: Radiation Oncology | Admitting: Radiation Oncology

## 2018-08-08 ENCOUNTER — Non-Acute Institutional Stay (SKILLED_NURSING_FACILITY): Payer: Medicare Other | Admitting: Adult Health

## 2018-08-08 DIAGNOSIS — E785 Hyperlipidemia, unspecified: Secondary | ICD-10-CM | POA: Diagnosis not present

## 2018-08-08 DIAGNOSIS — J189 Pneumonia, unspecified organism: Secondary | ICD-10-CM | POA: Diagnosis not present

## 2018-08-08 DIAGNOSIS — I639 Cerebral infarction, unspecified: Secondary | ICD-10-CM | POA: Diagnosis not present

## 2018-08-08 DIAGNOSIS — I482 Chronic atrial fibrillation, unspecified: Secondary | ICD-10-CM | POA: Diagnosis not present

## 2018-08-08 DIAGNOSIS — I10 Essential (primary) hypertension: Secondary | ICD-10-CM

## 2018-08-08 DIAGNOSIS — Z510 Encounter for antineoplastic radiation therapy: Secondary | ICD-10-CM | POA: Diagnosis not present

## 2018-08-08 DIAGNOSIS — C713 Malignant neoplasm of parietal lobe: Secondary | ICD-10-CM | POA: Diagnosis not present

## 2018-08-08 NOTE — Progress Notes (Signed)
Location:   [facility] Room Number: [place] of Service:  SNF (31)   CODE STATUS: Full Code  Allergies  Allergen Reactions  . Nitroglycerin Other (See Comments)    Can only use the patch  Turned lobster red and felt faint with sublingual NTG  . Penicillins Hives    Hives Has patient had a PCN reaction causing immediate rash, facial/tongue/throat swelling, SOB or lightheadedness with hypotension: YES Has patient had a PCN reaction causing severe rash involving mucus membranes or skin necrosis: NO Has patient had a PCN reaction that required hospitalization NO Has patient had a PCN reaction occurring within the last 10 years: No If all of the above answers are "NO", then may proceed with Cephalosporin use.  . Biaxin [Clarithromycin] Rash    Rash   . Latex Rash  . Lidocaine Swelling    Eye swelling Tolerates Tetracaine with epidural steroid injections (04/10/17)  . Nickel Rash  . Oxycodone Hives  . Tape Rash  . Tetracyclines & Related Rash       . Tizanidine Rash    Chief Complaint  Patient presents with  . Medical Management of Chronic Issues    Acute cva; chronic atrial fibrillation; essentia; hypertension; hyperlipidemia; unspecified hyperlipidemia type. HCAP     HPI:  She is a 77 year old short term rehab patient being seen for the management of her chronic illnesses: cva; afib; hypertension; dyslipidemia. She has started treatment for pneumonia with levaquin 750 mg daily; she is requiring 02 at 3L/[place]. Her 02 sats have dropped to the 88-91%. She has declined to go to the hospital at this time. She does have a cough; is short of breath. There are no reports of fevers present.   Past Medical History:  Diagnosis Date  . A-fib ([place])   . Arthritis   . Breast cancer ([place]) 2002   RT LUMPECTOMY  . Bronchitis   . Carotid artery narrowings   . 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 Carotid artery occlusion    RIGHT 50% BLOCKAGE  . Colon  polyps   . Complication of anesthesia    shaking/ FOLLOWING LOCAL AT DENTIST , drop O2 sats TO 50% DURING COLONOSCOPY  . Coronary artery disease   . Diverticulosis   . Dysrhythmia   . Environmental allergies   . GERD (gastroesophageal reflux disease)   . History of hiatal hernia   . Hyperlipidemia   . Hypertension   . IBS (irritable bowel syndrome)   . Mitral valve disorder   . Shortness of breath dyspnea   . Syncope and collapse   . UTI (lower urinary tract infection)     Past Surgical History:  Procedure Laterality Date  . APPENDECTOMY    . BREAST BIOPSY Right 2002   POS  . BREAST CYST ASPIRATION Right   . BREAST EXCISIONAL BIOPSY Left 1997   NEG  . BREAST LUMPECTOMY    . CATARACT EXTRACTION W/PHACO Right 06/07/2016   Procedure: CATARACT EXTRACTION PHACO AND INTRAOCULAR LENS PLACEMENT (IOC);  Surgeon: Birder Robson, MD;  Location: [facility] ORS;  Service: Ophthalmology;  Laterality: Right;  [place] 00:51 AP% 19.6 CDE 10.09 Fluid pack lot # [id number]  . CATARACT EXTRACTION W/PHACO Left 07/05/2016   Procedure: CATARACT EXTRACTION PHACO AND INTRAOCULAR LENS PLACEMENT (IOC);  Surgeon: Birder Robson, MD;  Location: [facility] ORS;  Service: Ophthalmology;  Laterality: Left;  [place] 00:38 AP% 24.9 CDE 9.60 Fluid Pack lot # [id number]  .  CESAREAN SECTION    . TONSILLECTOMY    . TOTAL KNEE ARTHROPLASTY Left     Social History   Socioeconomic History  . Marital status: Married    Spouse name: Not on file  . Number of children: Not on file  . Years of education: Not on file  . Highest education level: Not on file  Occupational History  . Occupation: retired    Comment: Nurse, adult  . Financial resource strain: Not on file  . Food insecurity:    Worry: Not on file    Inability: Not on file  . Transportation needs:    Medical: Yes    Non-medical: Yes  Tobacco Use  . Smoking status: Former Smoker    Years: 10.00    Types: Cigarettes    Last attempt to quit: 10/03/1969     Years since quitting: 48.8  . Smokeless tobacco: Never Used  Substance and Sexual Activity  . Alcohol use: Not Currently    Alcohol/week: 0.0 standard drinks  . Drug use: No  . Sexual activity: Not on file  Lifestyle  . Physical activity:    Days per week: Not on file    Minutes per session: Not on file  . Stress: Not on file  Relationships  . Social connections:    Talks on phone: Not on file    Gets together: Not on file    Attends religious service: Not on file    Active member of club or organization: Not on file    Attends meetings of clubs or organizations: Not on file    Relationship status: Not on file  . Intimate partner violence:    Fear of current or ex partner: No    Emotionally abused: No    Physically abused: No    Forced sexual activity: No  Other Topics Concern  . Not on file  Social History Narrative  . Not on file   Family History  Problem Relation Age of Onset  . Lung cancer Father   . Prostate cancer Father   . Hyperlipidemia Brother   . Hypertension Brother   . Obesity Brother   . Heart disease Mother   . Lung cancer Sister   . Kidney disease Unknown        Maternal grandparent  . Breast cancer Neg Hx       VITAL SIGNS Ht 5\' 4"  (1.626 m)   Wt 168 lb 4.8 oz (76.3 kg)   BMI 28.89 kg/m   Outpatient Encounter Medications as of 08/08/2018  Medication Sig  . acetaminophen (TYLENOL) 325 MG tablet Take 975 mg by mouth every 8 (eight) hours as needed.   Marland Kitchen apixaban (ELIQUIS) 2.5 MG TABS tablet Take 2.5 mg by mouth 2 (two) times daily.  Marland Kitchen atorvastatin (LIPITOR) 40 MG tablet Take 40 mg by mouth daily.  . clonazePAM (KLONOPIN) 0.25 MG disintegrating tablet Take 1 tablet (0.25 mg total) by mouth 2 (two) times daily.  Marland Kitchen dexamethasone (DECADRON) 2 MG tablet Take 2 mg by mouth every 12 (twelve) hours.  . Dextromethorphan-guaiFENesin 5-100 MG/5ML LIQD Take 15 mLs by mouth 3 (three) times daily.  . famotidine (PEPCID) 20 MG tablet Take 20 mg by mouth 2  (two) times daily as needed.  . fenofibrate (TRICOR) 145 MG tablet Take 145 mg by mouth daily.  Marland Kitchen ipratropium-albuterol (DUONEB) 0.5-2.5 (3) MG/3ML SOLN Take 3 mLs by nebulization every 6 (six) hours.  . Lacosamide (VIMPAT) 100 MG TABS Take 1  tablet (100 mg total) by mouth every 12 (twelve) hours.  . levETIRAcetam (KEPPRA) 750 MG tablet Take 1,500 mg by mouth 2 (two) times daily.  Marland Kitchen levofloxacin (LEVAQUIN) 750 MG tablet Take 750 mg by mouth daily.  Marland Kitchen LORazepam (ATIVAN) 1 MG tablet Take 1 tablet 20-30 min before radiotherapy, prn anxiety  . magnesium oxide (MAG-OX) 400 MG tablet Take 400 mg by mouth 2 (two) times daily.  . metoprolol tartrate (LOPRESSOR) 25 MG tablet Take 0.5 tablets (12.5 mg total) by mouth 2 (two) times daily.  . mineral oil (FLEET OIL) enema Give 1 enema rectally once daily every 3 days as needed if constipation not relieved by MOM or Bisacodyl suppository  . mirtazapine (REMERON) 15 MG tablet Take 7.5 mg by mouth at bedtime. 1/2 tab  . Multiple Vitamin (MULTIVITAMIN WITH MINERALS) TABS tablet Take 1 tablet by mouth daily.  . naphazoline-pheniramine (NAPHCON-A) 0.025-0.3 % ophthalmic solution Place 1 drop into both eyes 4 (four) times daily as needed for eye irritation.  . NON FORMULARY Diet Type: Regular  . ondansetron (ZOFRAN) 8 MG tablet Take 8 mg by mouth every 8 (eight) hours as needed for nausea.  . polyethylene glycol (MIRALAX / GLYCOLAX) packet Take 17 g by mouth daily.  . potassium chloride (K-DUR,KLOR-CON) 10 MEQ tablet Take 10 mEq by mouth daily.  Marland Kitchen saccharomyces boulardii (FLORASTOR) 250 MG capsule Take 250 mg by mouth 2 (two) times daily.  Marland Kitchen torsemide (DEMADEX) 10 MG tablet Take 10 mg by mouth daily.  . traMADol (ULTRAM) 50 MG tablet Take 0.5 tablets (25 mg total) by mouth every 6 (six) hours as needed. If pain not relieved by tylenol  . WOUND DRESSINGS EX Triad wound dressing paste - Apply thin film topically two times daily to macerated buttocks  .  [DISCONTINUED] ipratropium (ATROVENT) 0.02 % nebulizer solution Inhale 1 vial via inhalation three times daily for SOB / Coughing   No facility-administered encounter medications on file as of 08/08/2018.      SIGNIFICANT DIAGNOSTIC EXAMS  TODAY:   08-07-18: chest x-ray: mild plate like atelectasis versus pulmonary infiltrate left  lung base. Chronic lung changes and no significant change in calcified granuloma in left lung base.    PREVIOUS:   06-06-18: wbc 7.0; hgb 13.6; hc6 39.5; mcv 96; plt 146 glucose 131; bun 13; creat 0.6; k+ 4.1; na++ 140; ca 9.5   TODAY:   08-07-18: wbc 4.5; hgb 12.7; hct 37.2; mcv 96.9; plt 142; glucose 112; bun 21; creat 0.69; k+ 3.9; na++ 139; ca 9.4 liver normal albumin 3.4    Review of Systems  Constitutional: Negative for malaise/fatigue.  Respiratory: Positive for cough and shortness of breath.   Cardiovascular: Negative for chest pain, palpitations and leg swelling.  Gastrointestinal: Negative for abdominal pain, constipation and heartburn.  Musculoskeletal: Negative for back pain, joint pain and myalgias.  Skin: Negative.   Neurological: Negative for dizziness.  Psychiatric/Behavioral: The patient is not nervous/anxious.    Physical Exam  Constitutional: She appears well-developed and well-nourished. No distress.  Neck: No thyromegaly present.  Cardiovascular: Normal rate, regular rhythm, normal heart sounds and intact distal pulses.  Pulmonary/Chest: Effort normal. No respiratory distress.  Few scattered wheezes present Using 02   Abdominal: Soft. Bowel sounds are normal. She exhibits no distension. There is no tenderness.  Musculoskeletal: She exhibits no edema.  Right hemiparesis   Lymphadenopathy:    She has no cervical adenopathy.  Neurological: She is alert.  Skin: Skin is warm and dry. She  is not diaphoretic.  Psychiatric: She has a normal mood and affect.     ASSESSMENT/ PLAN:  TODAY   1. Essential hypertension: is stable   will continue  lopressor 12.5 mg twice daily   2. Chronic atrial fibrillation: heart rate is stable: will continue eliquis 2.5 mg twice daily and will continue lopressor 12.5 mg twice daily for rate control   3. Ischemic stroke of frontal lobe: is neurologically stable; has right hemiparesis: will continue eliquis 2.5 mg twice daily   4.  Hyperlipidemia: is stable will continue lipitor 40 mg daily and tricor 145 mg daily  5. HCAP(health care associated pneumonia) is without significant change: will complete levaquin 750 mg daily with florastor twice daily duoneb every 6 hours and robitussin DM 15 ml three times daily and 02. She has declined to go to the hospital at this time.   PREVIOUS  6. 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 Right radius fracture: is stable will continue to monitor and will follow up with orthopedics  8. Chronic constipation: is stable will continue miralax daily   9. Anxiety: is stable klonopin 0.25 mg twice daily and is taking remeron 7.5 mg nightly   10. 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 Is followed by oncology     MD is aware of resident's narcotic use and is in agreement with current plan of care. We will attempt to wean resident as apropriate   Ok Edwards NP [facility]  Contact [phone] Monday through Friday 8am- 5pm  After hours call [phone]

## 2018-08-09 ENCOUNTER — Ambulatory Visit
Admission: RE | Admit: 2018-08-09 | Discharge: 2018-08-09 | Disposition: A | Discharge status: Home / Self Care | Payer: Medicare Other | Source: Ambulatory Visit | Attending: Radiation Oncology | Admitting: Radiation Oncology

## 2018-08-09 ENCOUNTER — Encounter: Payer: Self-pay | Admitting: Internal Medicine

## 2018-08-09 DIAGNOSIS — Z510 Encounter for antineoplastic radiation therapy: Secondary | ICD-10-CM | POA: Diagnosis not present

## 2018-08-09 DIAGNOSIS — C713 Malignant neoplasm of parietal lobe: Secondary | ICD-10-CM | POA: Diagnosis not present

## 2018-08-10 ENCOUNTER — Telehealth: Payer: Self-pay

## 2018-08-10 ENCOUNTER — Ambulatory Visit
Admission: RE | Admit: 2018-08-10 | Discharge: 2018-08-10 | Disposition: A | Discharge status: Home / Self Care | Payer: Medicare Other | Source: Ambulatory Visit | Attending: Radiation Oncology | Admitting: Radiation Oncology

## 2018-08-10 DIAGNOSIS — C713 Malignant neoplasm of parietal lobe: Secondary | ICD-10-CM | POA: Diagnosis not present

## 2018-08-10 DIAGNOSIS — Z510 Encounter for antineoplastic radiation therapy: Secondary | ICD-10-CM | POA: Diagnosis not present

## 2018-08-10 DIAGNOSIS — J189 Pneumonia, unspecified organism: Secondary | ICD-10-CM | POA: Insufficient documentation

## 2018-08-10 NOTE — Telephone Encounter (Signed)
Received call from patient spouse Abbe Amsterdam stating that he had spoken with Maudie Mercury RN regarding cancelling and rescheduling appointments. He stated that he needs the MRI canceled for 11/12 and stated that he had received a number from [facility] and will call to cancel and then will follow up with Maudie Mercury regarding new appointments.

## 2018-08-13 ENCOUNTER — Other Ambulatory Visit: Payer: Self-pay | Admitting: *Deleted

## 2018-08-13 DIAGNOSIS — C713 Malignant neoplasm of parietal lobe: Secondary | ICD-10-CM

## 2018-08-14 ENCOUNTER — Other Ambulatory Visit: Payer: Medicare Other

## 2018-08-15 ENCOUNTER — Other Ambulatory Visit: Payer: Self-pay | Admitting: Adult Health

## 2018-08-15 MED ORDER — LACOSAMIDE 100 MG PO TABS: 1.0000 | ORAL_TABLET | Freq: Two times a day (BID) | ORAL | 0 refills | Status: DC

## 2018-08-15 MED ORDER — CLONAZEPAM 0.25 MG PO TBDP: 0.2500 mg | ORAL_TABLET | Freq: Two times a day (BID) | ORAL | 0 refills | Status: DC

## 2018-08-17 ENCOUNTER — Encounter: Payer: Self-pay | Admitting: Adult Health

## 2018-08-17 ENCOUNTER — Other Ambulatory Visit: Payer: Self-pay | Admitting: Adult Health

## 2018-08-17 ENCOUNTER — Encounter: Payer: Self-pay | Admitting: Radiation Oncology

## 2018-08-17 ENCOUNTER — Non-Acute Institutional Stay (SKILLED_NURSING_FACILITY): Payer: Medicare Other | Admitting: Adult Health

## 2018-08-17 DIAGNOSIS — G40109 Localization-related (focal) (partial) symptomatic epilepsy and epileptic syndromes with simple partial seizures, not intractable, without status epilepticus: Secondary | ICD-10-CM

## 2018-08-17 MED ORDER — LACOSAMIDE 150 MG PO TABS: 150.0000 mg | ORAL_TABLET | Freq: Two times a day (BID) | ORAL | 0 refills | Status: DC

## 2018-08-17 NOTE — Progress Notes (Signed)
[facility]:   [facility] Room Number: [place] of Service:  SNF (31)   CODE STATUS: Full code  Allergies  Allergen Reactions  . Nitroglycerin Other (See Comments)    Can only use the patch  Turned lobster red and felt faint with sublingual NTG  . Penicillins Hives    Hives Has patient had a PCN reaction causing immediate rash, facial/tongue/throat swelling, SOB or lightheadedness with hypotension: YES Has patient had a PCN reaction causing severe rash involving mucus membranes or skin necrosis: NO Has patient had a PCN reaction that required hospitalization NO Has patient had a PCN reaction occurring within the last 10 years: No If all of the above answers are "NO", then may proceed with Cephalosporin use.  . Biaxin [Clarithromycin] Rash    Rash   . Latex Rash  . Lidocaine Swelling    Eye swelling Tolerates Tetracaine with epidural steroid injections (04/10/17)  . Nickel Rash  . Oxycodone Hives  . Tape Rash  . Tetracyclines & Related Rash       . Tizanidine Rash    Chief Complaint  Patient presents with  . Acute Visit    seizure like activity    HPI:  She had seizure like activity this AM involving her right arm. She states this has happened before. This AM she did receive a one time dose of ativan 0.5 mg with relief of her seizure. She denies any loss of consciousness; denies any uncontrolled pain; does have urinary incontinence.   Past Medical History:  Diagnosis Date  . A-fib ([place])   . Arthritis   . Breast cancer ([place]) 2002   RT LUMPECTOMY  . Bronchitis   . Carotid artery narrowings   . 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 Carotid artery occlusion    RIGHT 50% BLOCKAGE  . Colon polyps   . Complication of anesthesia    shaking/ FOLLOWING LOCAL AT DENTIST , drop O2 sats TO 50% DURING COLONOSCOPY  . Coronary artery disease   . Diverticulosis   . Dysrhythmia   . Environmental allergies   . GERD (gastroesophageal reflux  disease)   . History of hiatal hernia   . Hyperlipidemia   . Hypertension   . IBS (irritable bowel syndrome)   . Mitral valve disorder   . Shortness of breath dyspnea   . Syncope and collapse   . UTI (lower urinary tract infection)     Past Surgical History:  Procedure Laterality Date  . APPENDECTOMY    . BREAST BIOPSY Right 2002   POS  . BREAST CYST ASPIRATION Right   . BREAST EXCISIONAL BIOPSY Left 1997   NEG  . BREAST LUMPECTOMY    . CATARACT EXTRACTION W/PHACO Right 06/07/2016   Procedure: CATARACT EXTRACTION PHACO AND INTRAOCULAR LENS PLACEMENT (IOC);  Surgeon: Birder Robson, MD;  Location: [facility] ORS;  Service: Ophthalmology;  Laterality: Right;  [place] 00:51 AP% 19.6 CDE 10.09 Fluid pack lot # [id number]  . CATARACT EXTRACTION W/PHACO Left 07/05/2016   Procedure: CATARACT EXTRACTION PHACO AND INTRAOCULAR LENS PLACEMENT (IOC);  Surgeon: Birder Robson, MD;  Location: [facility] ORS;  Service: Ophthalmology;  Laterality: Left;  [place] 00:38 AP% 24.9 CDE 9.60 Fluid Pack lot # [id number]  . CESAREAN SECTION    . TONSILLECTOMY    . TOTAL KNEE ARTHROPLASTY Left     Social History   Socioeconomic History  . Marital status: Married    Spouse name: Not on file  .  Number of children: Not on file  . Years of education: Not on file  . Highest education level: Not on file  Occupational History  . Occupation: retired    Comment: Nurse, adult  . Financial resource strain: Not on file  . Food insecurity:    Worry: Not on file    Inability: Not on file  . Transportation needs:    Medical: Yes    Non-medical: Yes  Tobacco Use  . Smoking status: Former Smoker    Years: 10.00    Types: Cigarettes    Last attempt to quit: 10/03/1969    Years since quitting: 48.9  . Smokeless tobacco: Never Used  Substance and Sexual Activity  . Alcohol use: Not Currently    Alcohol/week: 0.0 standard drinks  . Drug use: No  . Sexual activity: Not on file  Lifestyle  . Physical activity:      Days per week: Not on file    Minutes per session: Not on file  . Stress: Not on file  Relationships  . Social connections:    Talks on phone: Not on file    Gets together: Not on file    Attends religious service: Not on file    Active member of club or organization: Not on file    Attends meetings of clubs or organizations: Not on file    Relationship status: Not on file  . Intimate partner violence:    Fear of current or ex partner: No    Emotionally abused: No    Physically abused: No    Forced sexual activity: No  Other Topics Concern  . Not on file  Social History Narrative  . Not on file   Family History  Problem Relation Age of Onset  . Lung cancer Father   . Prostate cancer Father   . Hyperlipidemia Brother   . Hypertension Brother   . Obesity Brother   . Heart disease Mother   . Lung cancer Sister   . Kidney disease Unknown        Maternal grandparent  . Breast cancer Neg Hx       VITAL SIGNS BP 118/72   Pulse 74   Temp 97.6 F (36.4 C)   Resp 18   Ht 5\' 4"  (1.626 m)   Wt 179 lb 4.8 oz (81.3 kg)   SpO2 97%   BMI 30.78 kg/m   Outpatient Encounter Medications as of 08/17/2018  Medication Sig  . acetaminophen (TYLENOL) 325 MG tablet Take 975 mg by mouth every 8 (eight) hours as needed.   Marland Kitchen apixaban (ELIQUIS) 2.5 MG TABS tablet Take 2.5 mg by mouth 2 (two) times daily.  Marland Kitchen atorvastatin (LIPITOR) 40 MG tablet Take 40 mg by mouth daily.  . clonazePAM (KLONOPIN) 0.25 MG disintegrating tablet Take 1 tablet (0.25 mg total) by mouth 2 (two) times daily.  Marland Kitchen dexamethasone (DECADRON) 2 MG tablet Take 2 mg by mouth every 12 (twelve) hours.  . Dextromethorphan-guaiFENesin 5-100 MG/5ML LIQD Take 15 mLs by mouth 3 (three) times daily.  . famotidine (PEPCID) 20 MG tablet Take 20 mg by mouth 2 (two) times daily as needed.  . fenofibrate (TRICOR) 145 MG tablet Take 145 mg by mouth daily.  Marland Kitchen ipratropium-albuterol (DUONEB) 0.5-2.5 (3) MG/3ML SOLN Take 3 mLs by  nebulization every 6 (six) hours.  . Lacosamide (VIMPAT) 100 MG TABS Take 1 tablet (100 mg total) by mouth every 12 (twelve) hours.  . levETIRAcetam (KEPPRA) 750 MG  tablet Take 1,500 mg by mouth 2 (two) times daily.  Marland Kitchen levofloxacin (LEVAQUIN) 750 MG tablet Take 750 mg by mouth daily.  Marland Kitchen LORazepam (ATIVAN) 1 MG tablet Take 1 tablet 20-30 min before radiotherapy, prn anxiety  . magnesium oxide (MAG-OX) 400 MG tablet Take 400 mg by mouth 2 (two) times daily.  . metoprolol tartrate (LOPRESSOR) 25 MG tablet Take 0.5 tablets (12.5 mg total) by mouth 2 (two) times daily.  . mineral oil (FLEET OIL) enema Give 1 enema rectally once daily every 3 days as needed if constipation not relieved by MOM or Bisacodyl suppository  . mirtazapine (REMERON) 15 MG tablet Take 7.5 mg by mouth at bedtime. 1/2 tab  . Multiple Vitamin (MULTIVITAMIN WITH MINERALS) TABS tablet Take 1 tablet by mouth daily.  . naphazoline-pheniramine (NAPHCON-A) 0.025-0.3 % ophthalmic solution Place 1 drop into both eyes 4 (four) times daily as needed for eye irritation.  . NON FORMULARY Diet Type: Regular  . ondansetron (ZOFRAN) 8 MG tablet Take 8 mg by mouth every 8 (eight) hours as needed for nausea.  . OXYGEN Inhale 3 L/min into the lungs continuous.  . polyethylene glycol (MIRALAX / GLYCOLAX) packet Take 17 g by mouth daily.  . potassium chloride (K-DUR,KLOR-CON) 10 MEQ tablet Take 10 mEq by mouth daily.  . Probiotic Product (RISA-BID PROBIOTIC) TABS 1 billion cell - 25 mg:  Give 1 tablet by mouth daily  . torsemide (DEMADEX) 10 MG tablet Take 10 mg by mouth daily.  . traMADol (ULTRAM) 50 MG tablet Take 0.5 tablets (25 mg total) by mouth every 6 (six) hours as needed. If pain not relieved by tylenol  . WOUND DRESSINGS EX Triad wound dressing paste - Apply thin film topically two times daily to macerated buttocks  . [DISCONTINUED] saccharomyces boulardii (FLORASTOR) 250 MG capsule Take 250 mg by mouth 2 (two) times daily.   No  facility-administered encounter medications on file as of 08/17/2018.      SIGNIFICANT DIAGNOSTIC EXAMS  PREVIOUS:   08-07-18: chest x-ray: mild plate like atelectasis versus pulmonary infiltrate left  lung base. Chronic lung changes and no significant change in calcified granuloma in left lung base.   NO NEW EXAMS.    LABS REVIEWED: PREVIOUS:   06-06-18: wbc 7.0; hgb 13.6; hc6 39.5; mcv 96; plt 146 glucose 131; bun 13; creat 0.6; k+ 4.1; na++ 140; ca 9.5  08-07-18: wbc 4.5; hgb 12.7; hct 37.2; mcv 96.9; plt 142; glucose 112; bun 21; creat 0.69; k+ 3.9; na++ 139; ca 9.4 liver normal albumin 3.4   NO NEW LABS.    Review of Systems  Constitutional: Negative for malaise/fatigue.  Respiratory: Negative for cough and shortness of breath.   Cardiovascular: Negative for chest pain, palpitations and leg swelling.  Gastrointestinal: Negative for abdominal pain, constipation and heartburn.  Musculoskeletal: Negative for back pain, joint pain and myalgias.  Skin: Negative.   Neurological: Positive for seizures. Negative for dizziness.  Psychiatric/Behavioral: The patient is not nervous/anxious.      Physical Exam  Constitutional: She is oriented to person, place, and time. She appears well-developed and well-nourished. No distress.  Neck: No thyromegaly present.  Cardiovascular: Normal rate, regular rhythm, normal heart sounds and intact distal pulses.  Pulmonary/Chest: Effort normal and breath sounds normal. No respiratory distress.  Is on room air   Abdominal: Soft. Bowel sounds are normal. She exhibits no distension. There is no tenderness.  Musculoskeletal: She exhibits no edema.  Right hemiparesis  Lymphadenopathy:    She has  no cervical adenopathy.  Neurological: She is alert and oriented to person, place, and time. No cranial nerve deficit.  Skin: Skin is warm and dry. She is not diaphoretic.  Psychiatric: She has a normal mood and affect.     ASSESSMENT/ PLAN:  TODAY    1.  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       MD is aware of resident's narcotic use and is in agreement with current plan of care. We will attempt to wean resident as apropriate   Ok Edwards NP [facility]  Contact [phone] Monday through Friday 8am- 5pm  After hours call [phone]

## 2018-08-17 NOTE — Progress Notes (Signed)
  Radiation Oncology         [phone] ________________________________  Name: Isabel Hernandez MRN: [id number]  Date: 08/17/2018  DOB: 08/21/1941  End of Treatment Note  Diagnosis:   Glioblastoma of parietal lobe     Indication for treatment:  Palliative       Radiation treatment dates:   07/23/2018 - 08/10/2018  Site/dose:   Left parietal brain tumor, with margin  / total of 40.05 Gy in 15 fractions  Beams/energy:   IMRT / 6X photons  Narrative: The patient tolerated radiation treatment relatively well. She denied pain, skin issues, nausea or vomiting, issues with appetite, visual or auditory changes, fine motor changes, and slurred speech throughout treatment.   By the end of treatment, she reported severe fatigue.  Plan: The patient has completed radiation treatment. The patient will return to radiation oncology clinic for routine followup in one month. I advised them to call or return sooner if they have any questions or concerns related to their recovery or treatment.  -----------------------------------  Eppie Gibson, MD  This document serves as a record of services personally performed by Eppie Gibson, MD. It was created on his behalf by Wilburn Mylar, a trained medical scribe. The creation of this record is based on the scribe's personal observations and the provider's statements to them. This document has been checked and approved by the attending provider.

## 2018-08-20 ENCOUNTER — Non-Acute Institutional Stay (SKILLED_NURSING_FACILITY): Payer: Medicare Other | Admitting: Adult Health

## 2018-08-20 ENCOUNTER — Encounter: Payer: Self-pay | Admitting: Adult Health

## 2018-08-20 DIAGNOSIS — R600 Localized edema: Secondary | ICD-10-CM

## 2018-08-24 ENCOUNTER — Encounter: Payer: Self-pay | Admitting: Adult Health

## 2018-08-24 DIAGNOSIS — R600 Localized edema: Secondary | ICD-10-CM | POA: Insufficient documentation

## 2018-08-24 NOTE — Progress Notes (Signed)
Location:   [facility] Room Number: 174 Place of Service:  SNF (31)   CODE STATUS: full code   Allergies  Allergen Reactions  . Nitroglycerin Other (See Comments)    Can only use the patch  Turned lobster red and felt faint with sublingual NTG  . Penicillins Hives    Hives Has patient had a PCN reaction causing immediate rash, facial/tongue/throat swelling, SOB or lightheadedness with hypotension: YES Has patient had a PCN reaction causing severe rash involving mucus membranes or skin necrosis: NO Has patient had a PCN reaction that required hospitalization NO Has patient had a PCN reaction occurring within the last 10 years: No If all of the above answers are "NO", then may proceed with Cephalosporin use.  . Biaxin [Clarithromycin] Rash    Rash   . Latex Rash  . Lidocaine Swelling    Eye swelling Tolerates Tetracaine with epidural steroid injections (04/10/17)  . Nickel Rash  . Oxycodone Hives  . Tape Rash  . Tetracyclines & Related Rash       . Tizanidine Rash    Chief Complaint  Patient presents with  . Acute Visit    edema     HPI:  For the past couple of days she has noted that her right leg has worsening edema. She does wear her ted hose. She is taking demadex 10 mg daily. She denies any chest pain; shortness of breath or cough. There are no reports of fevers.   Past Medical History:  Diagnosis Date  . A-fib ([place])   . Arthritis   . Breast cancer ([place]) 2002   RT LUMPECTOMY  . Bronchitis   . Carotid artery narrowings   . 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 Carotid artery occlusion    RIGHT 50% BLOCKAGE  . Colon polyps   . Complication of anesthesia    shaking/ FOLLOWING LOCAL AT DENTIST , drop O2 sats TO 50% DURING COLONOSCOPY  . Coronary artery disease   . Diverticulosis   . Dysrhythmia   . Environmental allergies   . GERD (gastroesophageal reflux disease)   . History of hiatal hernia   . Hyperlipidemia   . Hypertension   .  IBS (irritable bowel syndrome)   . Mitral valve disorder   . Shortness of breath dyspnea   . Syncope and collapse   . UTI (lower urinary tract infection)     Past Surgical History:  Procedure Laterality Date  . APPENDECTOMY    . BREAST BIOPSY Right 2002   POS  . BREAST CYST ASPIRATION Right   . BREAST EXCISIONAL BIOPSY Left 1997   NEG  . BREAST LUMPECTOMY    . CATARACT EXTRACTION W/PHACO Right 06/07/2016   Procedure: CATARACT EXTRACTION PHACO AND INTRAOCULAR LENS PLACEMENT (IOC);  Surgeon: Birder Robson, MD;  Location: [facility] ORS;  Service: Ophthalmology;  Laterality: Right;  [place] 00:51 AP% 19.6 CDE 10.09 Fluid pack lot # [id number]  . CATARACT EXTRACTION W/PHACO Left 07/05/2016   Procedure: CATARACT EXTRACTION PHACO AND INTRAOCULAR LENS PLACEMENT (IOC);  Surgeon: Birder Robson, MD;  Location: [facility] ORS;  Service: Ophthalmology;  Laterality: Left;  [place] 00:38 AP% 24.9 CDE 9.60 Fluid Pack lot # [id number]  . CESAREAN SECTION    . TONSILLECTOMY    . TOTAL KNEE ARTHROPLASTY Left     Social History   Socioeconomic History  . Marital status: Married    Spouse name: Not on file  . Number of children: Not on file  .  Years of education: Not on file  . Highest education level: Not on file  Occupational History  . Occupation: retired    Comment: Nurse, adult  . Financial resource strain: Not on file  . Food insecurity:    Worry: Not on file    Inability: Not on file  . Transportation needs:    Medical: Yes    Non-medical: Yes  Tobacco Use  . Smoking status: Former Smoker    Years: 10.00    Types: Cigarettes    Last attempt to quit: 10/03/1969    Years since quitting: 48.9  . Smokeless tobacco: Never Used  Substance and Sexual Activity  . Alcohol use: Not Currently    Alcohol/week: 0.0 standard drinks  . Drug use: No  . Sexual activity: Not on file  Lifestyle  . Physical activity:    Days per week: Not on file    Minutes per session: Not on file  . Stress:  Not on file  Relationships  . Social connections:    Talks on phone: Not on file    Gets together: Not on file    Attends religious service: Not on file    Active member of club or organization: Not on file    Attends meetings of clubs or organizations: Not on file    Relationship status: Not on file  . Intimate partner violence:    Fear of current or ex partner: No    Emotionally abused: No    Physically abused: No    Forced sexual activity: No  Other Topics Concern  . Not on file  Social History Narrative  . Not on file   Family History  Problem Relation Age of Onset  . Lung cancer Father   . Prostate cancer Father   . Hyperlipidemia Brother   . Hypertension Brother   . Obesity Brother   . Heart disease Mother   . Lung cancer Sister   . Kidney disease Unknown        Maternal grandparent  . Breast cancer Neg Hx       VITAL SIGNS BP 112/65   Pulse 84   Temp 98.5 F (36.9 C)   Resp 18   Ht 5\' 4"  (1.626 m)   Wt 179 lb 4.8 oz (81.3 kg)   SpO2 93%   BMI 30.78 kg/m   Outpatient Encounter Medications as of 08/20/2018  Medication Sig  . acetaminophen (TYLENOL) 325 MG tablet Take 975 mg by mouth every 8 (eight) hours as needed.   Marland Kitchen apixaban (ELIQUIS) 2.5 MG TABS tablet Take 2.5 mg by mouth 2 (two) times daily.  Marland Kitchen atorvastatin (LIPITOR) 40 MG tablet Take 40 mg by mouth daily.  . clonazePAM (KLONOPIN) 0.25 MG disintegrating tablet Take 1 tablet (0.25 mg total) by mouth 2 (two) times daily.  Marland Kitchen dexamethasone (DECADRON) 2 MG tablet Take 2 mg by mouth every 12 (twelve) hours.  . Dextromethorphan-guaiFENesin 5-100 MG/5ML LIQD Take 15 mLs by mouth 3 (three) times daily.  . famotidine (PEPCID) 20 MG tablet Take 20 mg by mouth 2 (two) times daily as needed.  . fenofibrate (TRICOR) 145 MG tablet Take 145 mg by mouth daily.  Marland Kitchen ipratropium-albuterol (DUONEB) 0.5-2.5 (3) MG/3ML SOLN Take 3 mLs by nebulization every 6 (six) hours.  . Lacosamide (VIMPAT) 150 MG TABS Take 1 tablet  (150 mg total) by mouth every 12 (twelve) hours.  . levETIRAcetam (KEPPRA) 750 MG tablet Take 1,500 mg by mouth 2 (two) times  daily.  . [EXPIRED] levofloxacin (LEVAQUIN) 750 MG tablet Take 750 mg by mouth daily.  Marland Kitchen LORazepam (ATIVAN) 1 MG tablet Take 1 tablet 20-30 min before radiotherapy, prn anxiety  . magnesium oxide (MAG-OX) 400 MG tablet Take 400 mg by mouth 2 (two) times daily.  . metoprolol tartrate (LOPRESSOR) 25 MG tablet Take 0.5 tablets (12.5 mg total) by mouth 2 (two) times daily.  . mineral oil (FLEET OIL) enema Give 1 enema rectally once daily every 3 days as needed if constipation not relieved by MOM or Bisacodyl suppository  . mirtazapine (REMERON) 15 MG tablet Take 7.5 mg by mouth at bedtime. 1/2 tab  . Multiple Vitamin (MULTIVITAMIN WITH MINERALS) TABS tablet Take 1 tablet by mouth daily.  . naphazoline-pheniramine (NAPHCON-A) 0.025-0.3 % ophthalmic solution Place 1 drop into both eyes 4 (four) times daily as needed for eye irritation.  . NON FORMULARY Diet Type: Regular  . ondansetron (ZOFRAN) 8 MG tablet Take 8 mg by mouth every 8 (eight) hours as needed for nausea.  . OXYGEN Inhale 3 L/min into the lungs continuous.  . polyethylene glycol (MIRALAX / GLYCOLAX) packet Take 17 g by mouth daily.  . potassium chloride (K-DUR,KLOR-CON) 10 MEQ tablet Take 10 mEq by mouth daily.  . Probiotic Product (RISA-BID PROBIOTIC) TABS 1 billion cell - 25 mg:  Give 1 tablet by mouth daily  . torsemide (DEMADEX) 10 MG tablet Take 10 mg by mouth daily.  . traMADol (ULTRAM) 50 MG tablet Take 0.5 tablets (25 mg total) by mouth every 6 (six) hours as needed. If pain not relieved by tylenol  . WOUND DRESSINGS EX Triad wound dressing paste - Apply thin film topically two times daily to macerated buttocks   No facility-administered encounter medications on file as of 08/20/2018.      SIGNIFICANT DIAGNOSTIC EXAMS  PREVIOUS:   08-07-18: chest x-ray: mild plate like atelectasis versus pulmonary  infiltrate left  lung base. Chronic lung changes and no significant change in calcified granuloma in left lung base.   NO NEW EXAMS.    LABS REVIEWED: PREVIOUS:   06-06-18: wbc 7.0; hgb 13.6; hc6 39.5; mcv 96; plt 146 glucose 131; bun 13; creat 0.6; k+ 4.1; na++ 140; ca 9.5  08-07-18: wbc 4.5; hgb 12.7; hct 37.2; mcv 96.9; plt 142; glucose 112; bun 21; creat 0.69; k+ 3.9; na++ 139; ca 9.4 liver normal albumin 3.4   NO NEW LABS.   Review of Systems  Constitutional: Negative for malaise/fatigue.  Respiratory: Negative for cough and shortness of breath.   Cardiovascular: Positive for leg swelling. Negative for chest pain and palpitations.  Gastrointestinal: Negative for abdominal pain, constipation and heartburn.  Musculoskeletal: Negative for back pain, joint pain and myalgias.  Skin: Negative.   Neurological: Negative for dizziness.  Psychiatric/Behavioral: The patient is not nervous/anxious.     Physical Exam  Constitutional: She is oriented to person, place, and time. She appears well-developed and well-nourished. No distress.  Neck: No thyromegaly present.  Cardiovascular: Normal rate, regular rhythm, normal heart sounds and intact distal pulses.  Pulmonary/Chest: Effort normal and breath sounds normal. No respiratory distress.  Abdominal: Soft. Bowel sounds are normal. She exhibits no distension. There is no tenderness.  Musculoskeletal: She exhibits edema.  Has right hemiparesis Has 1+ right lower extremity edema   Lymphadenopathy:    She has no cervical adenopathy.  Neurological: She is alert and oriented to person, place, and time.  Skin: Skin is warm and dry. She is not diaphoretic.  Psychiatric:  She has a normal mood and affect.      ASSESSMENT/ PLAN:  TODAY;   1. Right lower extremity edema: is worse will increase demedex to 20 mg daily through 08-28-18; will check bmp on 08-28-18.   MD is aware of resident's narcotic use and is in agreement with current plan of  care. We will attempt to wean resident as apropriate   Ok Edwards NP [facility]  Contact [phone] Monday through Friday 8am- 5pm  After hours call [phone]

## 2018-08-28 ENCOUNTER — Encounter: Payer: Self-pay | Admitting: Adult Health

## 2018-08-28 ENCOUNTER — Other Ambulatory Visit
Admission: RE | Admit: 2018-08-28 | Discharge: 2018-08-28 | Disposition: A | Discharge status: Home / Self Care | Payer: Medicare Other | Source: Ambulatory Visit | Attending: Adult Health | Admitting: Adult Health

## 2018-08-28 ENCOUNTER — Non-Acute Institutional Stay (SKILLED_NURSING_FACILITY): Payer: Medicare Other | Admitting: Adult Health

## 2018-08-28 DIAGNOSIS — R600 Localized edema: Secondary | ICD-10-CM | POA: Diagnosis not present

## 2018-08-28 DIAGNOSIS — I10 Essential (primary) hypertension: Secondary | ICD-10-CM | POA: Insufficient documentation

## 2018-08-28 LAB — BASIC METABOLIC PANEL
ANION GAP: 10 (ref 5–15)
BUN: 21 mg/dL (ref 8–23)
CO2: 31 mmol/L (ref 22–32)
CREATININE: 0.65 mg/dL (ref 0.44–1.00)
Calcium: 9.5 mg/dL (ref 8.9–10.3)
Chloride: 97 mmol/L — ABNORMAL LOW (ref 98–111)
GFR calc Af Amer: >60 mL/min (ref >60)
GFR calc non Af Amer: >60 mL/min (ref >60)
Glucose, Bld: 100 mg/dL — ABNORMAL HIGH (ref 70–99)
Potassium: 3.4 mmol/L — ABNORMAL LOW (ref 3.5–5.1)
Sodium: 138 mmol/L (ref 135–145)

## 2018-08-28 NOTE — Progress Notes (Signed)
Location:   [facility] Room Number: [place] of Service:  SNF (31)   CODE STATUS: Full Code  Allergies  Allergen Reactions  . Nitroglycerin Other (See Comments)    Can only use the patch  Turned lobster red and felt faint with sublingual NTG  . Penicillins Hives    Hives Has patient had a PCN reaction causing immediate rash, facial/tongue/throat swelling, SOB or lightheadedness with hypotension: YES Has patient had a PCN reaction causing severe rash involving mucus membranes or skin necrosis: NO Has patient had a PCN reaction that required hospitalization NO Has patient had a PCN reaction occurring within the last 10 years: No If all of the above answers are "NO", then may proceed with Cephalosporin use.  . Biaxin [Clarithromycin] Rash    Rash   . Latex Rash  . Lidocaine Swelling    Eye swelling Tolerates Tetracaine with epidural steroid injections (04/10/17)  . Nickel Rash  . Oxycodone Hives  . Tape Rash  . Tetracyclines & Related Rash       . Tizanidine Rash    Chief Complaint  Patient presents with  . Acute Visit    Right leg edema    HPI:  Staff report that she has developed right lower extremity edema; which is her affected side. There is no redness no warmth; no tenderness; the edema is from knee down. There are no reports of fevers; she denies any pain. The swelling started a couple of days ago.   Past Medical History:  Diagnosis Date  . A-fib ([place])   . Arthritis   . Breast cancer ([place]) 2002   RT LUMPECTOMY  . Bronchitis   . Carotid artery narrowings   . 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 Carotid artery occlusion    RIGHT 50% BLOCKAGE  . Colon polyps   . Complication of anesthesia    shaking/ FOLLOWING LOCAL AT DENTIST , drop O2 sats TO 50% DURING COLONOSCOPY  . Coronary artery disease   . Diverticulosis   . Dysrhythmia   . Environmental allergies   . GERD (gastroesophageal reflux disease)   . History of  hiatal hernia   . Hyperlipidemia   . Hypertension   . IBS (irritable bowel syndrome)   . Mitral valve disorder   . Shortness of breath dyspnea   . Syncope and collapse   . UTI (lower urinary tract infection)     Past Surgical History:  Procedure Laterality Date  . APPENDECTOMY    . BREAST BIOPSY Right 2002   POS  . BREAST CYST ASPIRATION Right   . BREAST EXCISIONAL BIOPSY Left 1997   NEG  . BREAST LUMPECTOMY    . CATARACT EXTRACTION W/PHACO Right 06/07/2016   Procedure: CATARACT EXTRACTION PHACO AND INTRAOCULAR LENS PLACEMENT (IOC);  Surgeon: Birder Robson, MD;  Location: [facility] ORS;  Service: Ophthalmology;  Laterality: Right;  [place] 00:51 AP% 19.6 CDE 10.09 Fluid pack lot # [id number]  . CATARACT EXTRACTION W/PHACO Left 07/05/2016   Procedure: CATARACT EXTRACTION PHACO AND INTRAOCULAR LENS PLACEMENT (IOC);  Surgeon: Birder Robson, MD;  Location: [facility] ORS;  Service: Ophthalmology;  Laterality: Left;  [place] 00:38 AP% 24.9 CDE 9.60 Fluid Pack lot # [id number]  . CESAREAN SECTION    . TONSILLECTOMY    . TOTAL KNEE ARTHROPLASTY Left     Social History   Socioeconomic History  . Marital status: Married    Spouse name: Not on file  . Number  of children: Not on file  . Years of education: Not on file  . Highest education level: Not on file  Occupational History  . Occupation: retired    Comment: Nurse, adult  . Financial resource strain: Not on file  . Food insecurity:    Worry: Not on file    Inability: Not on file  . Transportation needs:    Medical: Yes    Non-medical: Yes  Tobacco Use  . Smoking status: Former Smoker    Years: 10.00    Types: Cigarettes    Last attempt to quit: 10/03/1969    Years since quitting: 48.9  . Smokeless tobacco: Never Used  Substance and Sexual Activity  . Alcohol use: Not Currently    Alcohol/week: 0.0 standard drinks  . Drug use: No  . Sexual activity: Not on file  Lifestyle  . Physical activity:    Days per week: Not on  file    Minutes per session: Not on file  . Stress: Not on file  Relationships  . Social connections:    Talks on phone: Not on file    Gets together: Not on file    Attends religious service: Not on file    Active member of club or organization: Not on file    Attends meetings of clubs or organizations: Not on file    Relationship status: Not on file  . Intimate partner violence:    Fear of current or ex partner: No    Emotionally abused: No    Physically abused: No    Forced sexual activity: No  Other Topics Concern  . Not on file  Social History Narrative  . Not on file   Family History  Problem Relation Age of Onset  . Lung cancer Father   . Prostate cancer Father   . Hyperlipidemia Brother   . Hypertension Brother   . Obesity Brother   . Heart disease Mother   . Lung cancer Sister   . Kidney disease Unknown        Maternal grandparent  . Breast cancer Neg Hx       VITAL SIGNS BP 114/67   Pulse 88   Temp 97.8 F (36.6 C)   Resp 20   Ht 5\' 4"  (1.626 m)   Wt 163 lb 11.2 oz (74.3 kg)   SpO2 96%   BMI 28.10 kg/m   Outpatient Encounter Medications as of 08/28/2018  Medication Sig  . acetaminophen (TYLENOL) 325 MG tablet Take 975 mg by mouth every 8 (eight) hours as needed.   Marland Kitchen apixaban (ELIQUIS) 2.5 MG TABS tablet Take 2.5 mg by mouth 2 (two) times daily.  Marland Kitchen atorvastatin (LIPITOR) 40 MG tablet Take 40 mg by mouth daily.  . clonazePAM (KLONOPIN) 0.25 MG disintegrating tablet Take 1 tablet (0.25 mg total) by mouth 2 (two) times daily.  Marland Kitchen dexamethasone (DECADRON) 2 MG tablet Take 2 mg by mouth every 12 (twelve) hours.  . docusate sodium (COLACE) 100 MG capsule Take 100 mg by mouth 2 (two) times daily.  . famotidine (PEPCID) 20 MG tablet Take 20 mg by mouth 2 (two) times daily as needed.  . fenofibrate (TRICOR) 145 MG tablet Take 145 mg by mouth daily.  Marland Kitchen guaiFENesin (ROBITUSSIN) 100 MG/5ML liquid Give 10 ml by mouth every 6 hours as needed for cough.  Notify  provider if cough worsens / continues longer than 3 days  . ipratropium-albuterol (DUONEB) 0.5-2.5 (3) MG/3ML SOLN Take 3 mLs  by nebulization every 6 (six) hours as needed.   . Lacosamide (VIMPAT) 150 MG TABS Take 1 tablet (150 mg total) by mouth every 12 (twelve) hours.  . levETIRAcetam (KEPPRA) 750 MG tablet Take 1,500 mg by mouth 2 (two) times daily.  Marland Kitchen LORazepam (ATIVAN) 1 MG tablet Take 1 tablet 20-30 min before radiotherapy, prn anxiety  . magnesium oxide (MAG-OX) 400 MG tablet Take 400 mg by mouth 2 (two) times daily.  . metoprolol tartrate (LOPRESSOR) 25 MG tablet Take 0.5 tablets (12.5 mg total) by mouth 2 (two) times daily.  . mineral oil (FLEET OIL) enema Give 1 enema rectally once daily every 3 days as needed if constipation not relieved by MOM or Bisacodyl suppository  . mirtazapine (REMERON) 15 MG tablet Take 7.5 mg by mouth at bedtime. 1/2 tab  . Multiple Vitamin (MULTIVITAMIN WITH MINERALS) TABS tablet Take 1 tablet by mouth daily.  . naphazoline-pheniramine (NAPHCON-A) 0.025-0.3 % ophthalmic solution Place 1 drop into both eyes 4 (four) times daily as needed for eye irritation.  . NON FORMULARY Diet Type: Regular  . ondansetron (ZOFRAN) 8 MG tablet Take 8 mg by mouth every 8 (eight) hours as needed for nausea.  . OXYGEN Inhale 3 L/min into the lungs continuous.  . polyethylene glycol (MIRALAX / GLYCOLAX) packet Take 17 g by mouth daily.  . potassium chloride (K-DUR,KLOR-CON) 10 MEQ tablet Take 10 mEq by mouth daily.  Marland Kitchen torsemide (DEMADEX) 20 MG tablet Take 20 mg by mouth daily.   . traMADol (ULTRAM) 50 MG tablet Take 0.5 tablets (25 mg total) by mouth every 6 (six) hours as needed. If pain not relieved by tylenol  . WOUND DRESSINGS EX Triad wound dressing paste - Apply thin film topically two times daily to macerated buttocks  . [DISCONTINUED] Dextromethorphan-guaiFENesin 5-100 MG/5ML LIQD Take 15 mLs by mouth 3 (three) times daily.  . [DISCONTINUED] Probiotic Product (RISA-BID  PROBIOTIC) TABS 1 billion cell - 25 mg:  Give 1 tablet by mouth daily   No facility-administered encounter medications on file as of 08/28/2018.      SIGNIFICANT DIAGNOSTIC EXAMS  PREVIOUS:   08-07-18: chest x-ray: mild plate like atelectasis versus pulmonary infiltrate left  lung base. Chronic lung changes and no significant change in calcified granuloma in left lung base.   NO NEW EXAMS.    LABS REVIEWED: PREVIOUS:   06-06-18: wbc 7.0; hgb 13.6; hc6 39.5; mcv 96; plt 146 glucose 131; bun 13; creat 0.6; k+ 4.1; na++ 140; ca 9.5  08-07-18: wbc 4.5; hgb 12.7; hct 37.2; mcv 96.9; plt 142; glucose 112; bun 21; creat 0.69; k+ 3.9; na++ 139; ca 9.4 liver normal albumin 3.4   TODAY:   08-28-18: glucose 100; bun 21 creat 0.65; k+ 3.4; na++ 138 ca 9.5    Review of Systems  Constitutional: Negative for malaise/fatigue.  Respiratory: Negative for cough and shortness of breath.   Cardiovascular: Positive for leg swelling. Negative for chest pain and palpitations.  Gastrointestinal: Negative for abdominal pain, constipation and heartburn.  Musculoskeletal: Negative for back pain, joint pain and myalgias.  Skin: Negative.   Neurological: Negative for dizziness.  Psychiatric/Behavioral: The patient is not nervous/anxious.    Physical Exam  Constitutional: She is oriented to person, place, and time. She appears well-developed and well-nourished. No distress.  Neck: No thyromegaly present.  Cardiovascular: Normal rate, regular rhythm, normal heart sounds and intact distal pulses.  Pulmonary/Chest: Effort normal and breath sounds normal. No respiratory distress.  Abdominal: Soft. Bowel sounds are  normal. She exhibits no distension. There is no tenderness.  Musculoskeletal: She exhibits edema.  Has right hemiparesis Has 1+ right lower extremity edema    Lymphadenopathy:    She has no cervical adenopathy.  Neurological: She is alert and oriented to person, place, and time.  Skin: Skin is warm  and dry. She is not diaphoretic.  Psychiatric: She has a normal mood and affect.     ASSESSMENT/ PLAN:  TODAY;   1. Right lower extremity edema: is stable  will continue demedex to 20 mg daily through 08-28-18; will check bmp on 08-28-18. More than likely this a gravitational effect   MD is aware of resident's narcotic use and is in agreement with current plan of care. We will attempt to wean resident as apropriate   Ok Edwards NP [facility]  Contact [phone] Monday through Friday 8am- 5pm  After hours call [phone]

## 2018-08-29 ENCOUNTER — Other Ambulatory Visit: Payer: Self-pay | Admitting: Adult Health

## 2018-09-05 ENCOUNTER — Encounter: Payer: Self-pay | Admitting: Adult Health

## 2018-09-05 ENCOUNTER — Other Ambulatory Visit: Payer: Self-pay | Admitting: Radiation Therapy

## 2018-09-05 NOTE — Progress Notes (Signed)
Entered in error

## 2018-09-06 ENCOUNTER — Encounter: Payer: Self-pay | Admitting: Radiation Oncology

## 2018-09-06 ENCOUNTER — Ambulatory Visit
Admission: RE | Admit: 2018-09-06 | Discharge: 2018-09-06 | Disposition: A | Discharge status: Home / Self Care | Payer: Medicare Other | Source: Ambulatory Visit | Attending: Internal Medicine | Admitting: Internal Medicine

## 2018-09-06 ENCOUNTER — Other Ambulatory Visit: Payer: Medicare Other

## 2018-09-06 ENCOUNTER — Encounter: Payer: Self-pay | Admitting: Adult Health

## 2018-09-06 ENCOUNTER — Non-Acute Institutional Stay (SKILLED_NURSING_FACILITY): Payer: Medicare Other | Admitting: Adult Health

## 2018-09-06 DIAGNOSIS — C713 Malignant neoplasm of parietal lobe: Secondary | ICD-10-CM | POA: Diagnosis not present

## 2018-09-06 DIAGNOSIS — F329 Major depressive disorder, single episode, unspecified: Secondary | ICD-10-CM | POA: Diagnosis not present

## 2018-09-06 MED ORDER — GADOBUTROL 1 MMOL/ML IV SOLN
8.0000 mL | Freq: Once | INTRAVENOUS | Status: AC | PRN
  Administered 2018-09-06: 8 mL via INTRAVENOUS

## 2018-09-06 NOTE — Progress Notes (Signed)
Location:   [facility] Room Number: 791T Place of Service:  SNF (31)   CODE STATUS: FULL  Allergies  Allergen Reactions  . Nitroglycerin Other (See Comments)    Can only use the patch  Turned lobster red and felt faint with sublingual NTG  . Penicillins Hives    Hives Has patient had a PCN reaction causing immediate rash, facial/tongue/throat swelling, SOB or lightheadedness with hypotension: YES Has patient had a PCN reaction causing severe rash involving mucus membranes or skin necrosis: NO Has patient had a PCN reaction that required hospitalization NO Has patient had a PCN reaction occurring within the last 10 years: No If all of the above answers are "NO", then may proceed with Cephalosporin use.  . Biaxin [Clarithromycin] Rash    Rash   . Latex Rash  . Lidocaine Swelling    Eye swelling Tolerates Tetracaine with epidural steroid injections (04/10/17)  . Nickel Rash  . Oxycodone Hives  . Tape Rash  . Tetracyclines & Related Rash       . Tizanidine Rash    Chief Complaint  Patient presents with  . Acute Visit    Mood state increased tremors     HPI:  She has just found out that she will not be returning home. She is tearful; feels overwhelmed with her situation. She wants to stop her physical therapy; she states that she is not sleeping as well as she should. We have discussed her emotional state; that mourning her loss is normal; but feeling overwhelmed with these feelings. She is willing to try an antidepressant.   Past Medical History:  Diagnosis Date  . A-fib ([place])   . Arthritis   . Breast cancer ([place]) 2002   RT LUMPECTOMY  . Bronchitis   . Carotid artery narrowings   . 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 Carotid artery occlusion    RIGHT 50% BLOCKAGE  . Colon polyps   . Complication of anesthesia    shaking/ FOLLOWING LOCAL AT DENTIST , drop O2 sats TO 50% DURING COLONOSCOPY  . Coronary artery disease   .  Diverticulosis   . Dysrhythmia   . Environmental allergies   . GERD (gastroesophageal reflux disease)   . History of hiatal hernia   . Hyperlipidemia   . Hypertension   . IBS (irritable bowel syndrome)   . Mitral valve disorder   . Shortness of breath dyspnea   . Syncope and collapse   . UTI (lower urinary tract infection)     Past Surgical History:  Procedure Laterality Date  . APPENDECTOMY    . BREAST BIOPSY Right 2002   POS  . BREAST CYST ASPIRATION Right   . BREAST EXCISIONAL BIOPSY Left 1997   NEG  . BREAST LUMPECTOMY    . CATARACT EXTRACTION W/PHACO Right 06/07/2016   Procedure: CATARACT EXTRACTION PHACO AND INTRAOCULAR LENS PLACEMENT (IOC);  Surgeon: Birder Robson, MD;  Location: [facility] ORS;  Service: Ophthalmology;  Laterality: Right;  [place] 00:51 AP% 19.6 CDE 10.09 Fluid pack lot # [id number]  . CATARACT EXTRACTION W/PHACO Left 07/05/2016   Procedure: CATARACT EXTRACTION PHACO AND INTRAOCULAR LENS PLACEMENT (IOC);  Surgeon: Birder Robson, MD;  Location: [facility] ORS;  Service: Ophthalmology;  Laterality: Left;  [place] 00:38 AP% 24.9 CDE 9.60 Fluid Pack lot # [id number]  . CESAREAN SECTION    . TONSILLECTOMY    . TOTAL KNEE ARTHROPLASTY Left     Social History   Socioeconomic  History  . Marital status: Married    Spouse name: Not on file  . Number of children: Not on file  . Years of education: Not on file  . Highest education level: Not on file  Occupational History  . Occupation: retired    Comment: Nurse, adult  . Financial resource strain: Not on file  . Food insecurity:    Worry: Not on file    Inability: Not on file  . Transportation needs:    Medical: Yes    Non-medical: Yes  Tobacco Use  . Smoking status: Former Smoker    Years: 10.00    Types: Cigarettes    Last attempt to quit: 10/03/1969    Years since quitting: 48.9  . Smokeless tobacco: Never Used  Substance and Sexual Activity  . Alcohol use: Not Currently    Alcohol/week: 0.0  standard drinks  . Drug use: No  . Sexual activity: Not on file  Lifestyle  . Physical activity:    Days per week: Not on file    Minutes per session: Not on file  . Stress: Not on file  Relationships  . Social connections:    Talks on phone: Not on file    Gets together: Not on file    Attends religious service: Not on file    Active member of club or organization: Not on file    Attends meetings of clubs or organizations: Not on file    Relationship status: Not on file  . Intimate partner violence:    Fear of current or ex partner: No    Emotionally abused: No    Physically abused: No    Forced sexual activity: No  Other Topics Concern  . Not on file  Social History Narrative  . Not on file   Family History  Problem Relation Age of Onset  . Lung cancer Father   . Prostate cancer Father   . Hyperlipidemia Brother   . Hypertension Brother   . Obesity Brother   . Heart disease Mother   . Lung cancer Sister   . Kidney disease Unknown        Maternal grandparent  . Breast cancer Neg Hx       VITAL SIGNS BP 123/68   Pulse 87   Temp 98 F (36.7 C) (Oral)   Resp 20   Ht 5\' 4"  (1.626 m)   Wt 173 lb 14.4 oz (78.9 kg)   SpO2 94%   BMI 29.85 kg/m   Outpatient Encounter Medications as of 09/06/2018  Medication Sig  . acetaminophen (TYLENOL) 325 MG tablet Take 975 mg by mouth every 8 (eight) hours as needed.   Marland Kitchen apixaban (ELIQUIS) 2.5 MG TABS tablet Take 2.5 mg by mouth 2 (two) times daily.  Marland Kitchen atorvastatin (LIPITOR) 40 MG tablet Take 40 mg by mouth daily.  . clonazePAM (KLONOPIN) 0.25 MG disintegrating tablet Take 1 tablet (0.25 mg total) by mouth 2 (two) times daily.  Marland Kitchen dexamethasone (DECADRON) 2 MG tablet Take 2 mg by mouth every 12 (twelve) hours.  . docusate sodium (COLACE) 100 MG capsule Take 100 mg by mouth 2 (two) times daily.  . famotidine (PEPCID) 20 MG tablet Take 20 mg by mouth 2 (two) times daily as needed.  . fenofibrate (TRICOR) 145 MG tablet Take 145  mg by mouth daily.  Marland Kitchen guaiFENesin (ROBITUSSIN) 100 MG/5ML liquid Give 10 ml by mouth every 6 hours as needed for cough.  Notify provider if cough  worsens / continues longer than 3 days  . ipratropium-albuterol (DUONEB) 0.5-2.5 (3) MG/3ML SOLN Take 3 mLs by nebulization every 6 (six) hours as needed.   . Lacosamide (VIMPAT) 150 MG TABS Take 1 tablet (150 mg total) by mouth every 12 (twelve) hours.  . levETIRAcetam (KEPPRA) 750 MG tablet Take 1,500 mg by mouth 2 (two) times daily.  Marland Kitchen LORazepam (ATIVAN) 1 MG tablet Take 1 tablet 20-30 min before radiotherapy, prn anxiety  . magnesium oxide (MAG-OX) 400 MG tablet Take 400 mg by mouth 2 (two) times daily.  . metoprolol tartrate (LOPRESSOR) 25 MG tablet Take 0.5 tablets (12.5 mg total) by mouth 2 (two) times daily.  . mineral oil (FLEET OIL) enema Give 1 enema rectally once daily every 3 days as needed if constipation not relieved by MOM or Bisacodyl suppository  . mirtazapine (REMERON) 15 MG tablet Take 7.5 mg by mouth at bedtime. 1/2 tab  . Multiple Vitamin (MULTIVITAMIN WITH MINERALS) TABS tablet Take 1 tablet by mouth daily.  . naphazoline-pheniramine (NAPHCON-A) 0.025-0.3 % ophthalmic solution Place 1 drop into both eyes 4 (four) times daily as needed for eye irritation.  . NON FORMULARY Diet Type: Regular  . nystatin (MYCOSTATIN) 100000 UNIT/ML suspension Take 10 mLs by mouth 4 (four) times daily. Swish for 30 seconds and spit for oral thrush  . ondansetron (ZOFRAN) 8 MG tablet Take 8 mg by mouth every 8 (eight) hours as needed for nausea.  . OXYGEN Inhale 3 L/min into the lungs continuous.  . polyethylene glycol (MIRALAX / GLYCOLAX) packet Take 17 g by mouth daily.  . potassium chloride SA (K-DUR,KLOR-CON) 20 MEQ tablet Take 40 mEq by mouth daily. 2 caps  . torsemide (DEMADEX) 20 MG tablet Take 20 mg by mouth daily.   . traMADol (ULTRAM) 50 MG tablet Take 0.5 tablets (25 mg total) by mouth every 6 (six) hours as needed. If pain not relieved by  tylenol  . WOUND DRESSINGS EX Triad wound dressing paste - Apply thin film topically two times daily to macerated buttocks   No facility-administered encounter medications on file as of 09/06/2018.      SIGNIFICANT DIAGNOSTIC EXAMS  PREVIOUS:   08-07-18: chest x-ray: mild plate like atelectasis versus pulmonary infiltrate left  lung base. Chronic lung changes and no significant change in calcified granuloma in left lung base.   NO NEW EXAMS.    LABS REVIEWED: PREVIOUS:   06-06-18: wbc 7.0; hgb 13.6; hc6 39.5; mcv 96; plt 146 glucose 131; bun 13; creat 0.6; k+ 4.1; na++ 140; ca 9.5  08-07-18: wbc 4.5; hgb 12.7; hct 37.2; mcv 96.9; plt 142; glucose 112; bun 21; creat 0.69; k+ 3.9; na++ 139; ca 9.4 liver normal albumin 3.4  08-28-18: glucose 100; bun 21 creat 0.65; k+ 3.4; na++ 138 ca 9.5   NO NEW LABS.    Review of Systems  Constitutional: Negative for malaise/fatigue.  Respiratory: Negative for cough and shortness of breath.   Cardiovascular: Negative for chest pain, palpitations and leg swelling.  Gastrointestinal: Negative for abdominal pain, constipation and heartburn.  Musculoskeletal: Negative for back pain, joint pain and myalgias.  Skin: Negative.   Neurological: Positive for tremors. Negative for dizziness.  Psychiatric/Behavioral: Positive for depression. Negative for suicidal ideas. The patient has insomnia. The patient is not nervous/anxious.     Physical Exam  Constitutional: She is oriented to person, place, and time. She appears well-developed and well-nourished. No distress.  Neck: No thyromegaly present.  Cardiovascular: Normal rate, regular rhythm, normal  heart sounds and intact distal pulses.  Pulmonary/Chest: Effort normal and breath sounds normal. No respiratory distress.  Abdominal: Soft. Bowel sounds are normal. She exhibits no distension. There is no tenderness.  Musculoskeletal: She exhibits edema.  Has right hemiparesis Has trace right lower extremity  edema     Lymphadenopathy:    She has no cervical adenopathy.  Neurological: She is alert and oriented to person, place, and time.  Skin: Skin is warm and dry. She is not diaphoretic.  Psychiatric:  Is tearful; has a depressed affect      ASSESSMENT/ PLAN:  TODAY;   1. 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 We will attempt to wean resident as apropriate   Ok Edwards NP [facility]  Contact [phone] Monday through Friday 8am- 5pm  After hours call [phone]

## 2018-09-07 ENCOUNTER — Non-Acute Institutional Stay (SKILLED_NURSING_FACILITY): Payer: Medicare Other | Admitting: Adult Health

## 2018-09-07 ENCOUNTER — Encounter: Payer: Self-pay | Admitting: Adult Health

## 2018-09-07 DIAGNOSIS — C713 Malignant neoplasm of parietal lobe: Secondary | ICD-10-CM

## 2018-09-07 NOTE — Progress Notes (Signed)
[facility]   [facility] Room Number: [place] of Service:  SNF (31)   CODE STATUS: Full Code  Allergies  Allergen Reactions  . Nitroglycerin Other (See Comments)    Can only use the patch  Turned lobster red and felt faint with sublingual NTG  . Penicillins Hives    Hives Has patient had a PCN reaction causing immediate rash, facial/tongue/throat swelling, SOB or lightheadedness with hypotension: YES Has patient had a PCN reaction causing severe rash involving mucus membranes or skin necrosis: NO Has patient had a PCN reaction that required hospitalization NO Has patient had a PCN reaction occurring within the last 10 years: No If all of the above answers are "NO", then may proceed with Cephalosporin use.  . Biaxin [Clarithromycin] Rash    Rash   . Latex Rash  . Lidocaine Swelling    Eye swelling Tolerates Tetracaine with epidural steroid injections (04/10/17)  . Nickel Rash  . Oxycodone Hives  . Tape Rash  . Tetracyclines & Related Rash       . Tizanidine Rash    Chief Complaint  Patient presents with  . Acute Visit    Change in Status    HPI:  She is having increased right upper extremity weakness. She is unable to feed herself; she does have right hand tremors present. She does not want to participate in therapy. She does not want me to inform oncology of her change in her right extremity. She does not want me to inform her family. She tells me that she is due to see her oncologist soon and will let him know then.   Past Medical History:  Diagnosis Date  . A-fib ([place])   . Arthritis   . Breast cancer ([place]) 2002   RT LUMPECTOMY  . Bronchitis   . Carotid artery narrowings   . 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 Carotid artery occlusion    RIGHT 50% BLOCKAGE  . Colon polyps   . Complication of anesthesia    shaking/ FOLLOWING LOCAL AT DENTIST , drop O2 sats TO 50% DURING COLONOSCOPY  . Coronary artery disease   .  Diverticulosis   . Dysrhythmia   . Environmental allergies   . GERD (gastroesophageal reflux disease)   . History of hiatal hernia   . History of radiation therapy 07/23/18- 08/10/18   Left parietal brain tumor. 40.05 Gy in 15 fractions.   . Hyperlipidemia   . Hypertension   . IBS (irritable bowel syndrome)   . Mitral valve disorder   . Shortness of breath dyspnea   . Syncope and collapse   . UTI (lower urinary tract infection)     Past Surgical History:  Procedure Laterality Date  . APPENDECTOMY    . BREAST BIOPSY Right 2002   POS  . BREAST CYST ASPIRATION Right   . BREAST EXCISIONAL BIOPSY Left 1997   NEG  . BREAST LUMPECTOMY    . CATARACT EXTRACTION W/PHACO Right 06/07/2016   Procedure: CATARACT EXTRACTION PHACO AND INTRAOCULAR LENS PLACEMENT (IOC);  Surgeon: Birder Robson, MD;  Location: [facility] ORS;  Service: Ophthalmology;  Laterality: Right;  [place] 00:51 AP% 19.6 CDE 10.09 Fluid pack lot # [id number]  . CATARACT EXTRACTION W/PHACO Left 07/05/2016   Procedure: CATARACT EXTRACTION PHACO AND INTRAOCULAR LENS PLACEMENT (IOC);  Surgeon: Birder Robson, MD;  Location: [facility] ORS;  Service: Ophthalmology;  Laterality: Left;  [place] 00:38 AP% 24.9 CDE 9.60 Fluid Pack lot #  [id number] [place] TONSILLECTOMY    . TOTAL KNEE ARTHROPLASTY Left     Social History   Socioeconomic History  . Marital status: Married    Spouse name: Not on file  . Number of children: Not on file  . Years of education: Not on file  . Highest education level: Not on file  Occupational History  . Occupation: retired    Comment: Nurse, adult  . Financial resource strain: Not on file  . Food insecurity:    Worry: Not on file    Inability: Not on file  . Transportation needs:    Medical: Yes    Non-medical: Yes  Tobacco Use  . Smoking status: Former Smoker    Years: 10.00    Types: Cigarettes    Last attempt to quit: 10/03/1969    Years since quitting: 48.9  . Smokeless  tobacco: Never Used  Substance and Sexual Activity  . Alcohol use: Not Currently    Alcohol/week: 0.0 standard drinks  . Drug use: No  . Sexual activity: Not on file  Lifestyle  . Physical activity:    Days per week: Not on file    Minutes per session: Not on file  . Stress: Not on file  Relationships  . Social connections:    Talks on phone: Not on file    Gets together: Not on file    Attends religious service: Not on file    Active member of club or organization: Not on file    Attends meetings of clubs or organizations: Not on file    Relationship status: Not on file  . Intimate partner violence:    Fear of current or ex partner: No    Emotionally abused: No    Physically abused: No    Forced sexual activity: No  Other Topics Concern  . Not on file  Social History Narrative  . Not on file   Family History  Problem Relation Age of Onset  . Lung cancer Father   . Prostate cancer Father   . Hyperlipidemia Brother   . Hypertension Brother   . Obesity Brother   . Heart disease Mother   . Lung cancer Sister   . Kidney disease Unknown        Maternal grandparent  . Breast cancer Neg Hx       VITAL SIGNS BP 139/65   Pulse 89   Temp 98.5 F (36.9 C)   Resp 20   Ht 5\' 4"  (1.626 m)   Wt 173 lb 14.4 oz (78.9 kg)   SpO2 (!) 89%   BMI 29.85 kg/m   Outpatient Encounter Medications as of 09/07/2018  Medication Sig  . acetaminophen (TYLENOL) 325 MG tablet Take 975 mg by mouth every 8 (eight) hours as needed.   Marland Kitchen apixaban (ELIQUIS) 2.5 MG TABS tablet Take 2.5 mg by mouth 2 (two) times daily.  Marland Kitchen atorvastatin (LIPITOR) 40 MG tablet Take 40 mg by mouth daily.  . clonazePAM (KLONOPIN) 0.25 MG disintegrating tablet Take 1 tablet (0.25 mg total) by mouth 2 (two) times daily.  Marland Kitchen dexamethasone (DECADRON) 2 MG tablet Take 2 mg by mouth every 12 (twelve) hours.  . docusate sodium (COLACE) 100 MG capsule Take 100 mg by mouth 2 (two) times daily.  . famotidine (PEPCID) 20 MG  tablet Take 20 mg by mouth 2 (two) times daily as needed.  . fenofibrate (TRICOR) 145 MG tablet Take 145 mg  by mouth daily.  Marland Kitchen guaiFENesin (ROBITUSSIN) 100 MG/5ML liquid Give 10 ml by mouth every 6 hours as needed for cough.  Notify provider if cough worsens / continues longer than 3 days  . ipratropium-albuterol (DUONEB) 0.5-2.5 (3) MG/3ML SOLN Take 3 mLs by nebulization every 6 (six) hours as needed.   . Lacosamide (VIMPAT) 150 MG TABS Take 1 tablet (150 mg total) by mouth every 12 (twelve) hours.  . levETIRAcetam (KEPPRA) 750 MG tablet Take 1,500 mg by mouth 2 (two) times daily.  Marland Kitchen LORazepam (ATIVAN) 1 MG tablet Take 1 tablet 20-30 min before radiotherapy, prn anxiety  . magnesium oxide (MAG-OX) 400 MG tablet Take 400 mg by mouth 2 (two) times daily.  . metoprolol tartrate (LOPRESSOR) 25 MG tablet Take 0.5 tablets (12.5 mg total) by mouth 2 (two) times daily.  . mineral oil (FLEET OIL) enema Give 1 enema rectally once daily every 3 days as needed if constipation not relieved by MOM or Bisacodyl suppository  . mirtazapine (REMERON) 15 MG tablet Take 7.5 mg by mouth at bedtime. 1/2 tab  . Multiple Vitamin (MULTIVITAMIN WITH MINERALS) TABS tablet Take 1 tablet by mouth daily.  . naphazoline-pheniramine (NAPHCON-A) 0.025-0.3 % ophthalmic solution Place 1 drop into both eyes 4 (four) times daily as needed for eye irritation.  . NON FORMULARY Diet Type: Regular  . nystatin (MYCOSTATIN) 100000 UNIT/ML suspension Take 10 mLs by mouth 4 (four) times daily. Swish for 30 seconds and spit for oral thrush  . ondansetron (ZOFRAN) 8 MG tablet Take 8 mg by mouth every 8 (eight) hours as needed for nausea.  . OXYGEN Inhale 2 L/min into the lungs as needed. To keep sats above 91%. Check O2 saturations prior to placing on patient and at least every 4 hours after applying.  Notify MD if oxygen saturations drop below baseline while receiving oxygen or no improvement in dyspnea  . polyethylene glycol (MIRALAX /  GLYCOLAX) packet Take 17 g by mouth daily.  . potassium chloride SA (K-DUR,KLOR-CON) 20 MEQ tablet Take 40 mEq by mouth daily. 2 caps  . sertraline (ZOLOFT) 50 MG tablet Take 50 mg by mouth daily. Ending on 09/13/18 then start 100 mg on 09/14/18  . torsemide (DEMADEX) 20 MG tablet Take 20 mg by mouth daily.   . traMADol (ULTRAM) 50 MG tablet Take 0.5 tablets (25 mg total) by mouth every 6 (six) hours as needed. If pain not relieved by tylenol  . WOUND DRESSINGS EX Triad wound dressing paste - Apply thin film topically two times daily to macerated buttocks   No facility-administered encounter medications on file as of 09/07/2018.      SIGNIFICANT DIAGNOSTIC EXAMS   PREVIOUS:   08-07-18: chest x-ray: mild plate like atelectasis versus pulmonary infiltrate left  lung base. Chronic lung changes and no significant change in calcified granuloma in left lung base.   TODAY:   09-06-18: MRI of brain:  Enlargement of dominant enhancing left parietal mass and surrounding satellite nodules with increased edema and possible nonenhancing tumor. Findings may reflect progressive tumor or acute post radiation changes.   LABS REVIEWED: PREVIOUS:   06-06-18: wbc 7.0; hgb 13.6; hc6 39.5; mcv 96; plt 146 glucose 131; bun 13; creat 0.6; k+ 4.1; na++ 140; ca 9.5  08-07-18: wbc 4.5; hgb 12.7; hct 37.2; mcv 96.9; plt 142; glucose 112; bun 21; creat 0.69; k+ 3.9; na++ 139; ca 9.4 liver normal albumin 3.4  08-28-18: glucose 100; bun 21 creat 0.65; k+ 3.4; na++ 138 ca  9.5   NO NEW LABS.   Review of Systems  Constitutional: Negative for malaise/fatigue.  Respiratory: Negative for cough and shortness of breath.   Cardiovascular: Negative for chest pain, palpitations and leg swelling.  Gastrointestinal: Negative for abdominal pain, constipation and heartburn.  Musculoskeletal: Negative for back pain, joint pain and myalgias.  Skin: Negative.   Neurological: Positive for tremors and weakness. Negative for  dizziness.  Psychiatric/Behavioral: Positive for depression. The patient is not nervous/anxious.     Physical Exam  Constitutional: She is oriented to person, place, and time. She appears well-developed and well-nourished. No distress.  Neck: No thyromegaly present.  Cardiovascular: Normal rate, regular rhythm, normal heart sounds and intact distal pulses.  Pulmonary/Chest: Effort normal and breath sounds normal. No respiratory distress.  Abdominal: Soft. Bowel sounds are normal. She exhibits no distension. There is no tenderness.  Musculoskeletal: She exhibits edema.  Has right hemiparesis Has trace right lower extremity edema    Right grip is weak is able to move arm Is unable to feed self; perform fine motor tasks   Lymphadenopathy:    She has no cervical adenopathy.  Neurological: She is alert and oriented to person, place, and time.  Skin: Skin is warm and dry. She is not diaphoretic.  Psychiatric:  Depressed affect     ASSESSMENT/ PLAN:  TODAY:   1. Glioblastoma or parietal lobe, left previous biopsied; radiation treatment 07-23-18 through 08-10-18: is worse with increased right upper extremity affects: at this time will continue to monitor her status; She has declined me contacting oncology or her family.    MD is aware of resident's narcotic use and is in agreement with current plan of care. We will attempt to wean resident as apropriate   Ok Edwards NP [facility]  Contact [phone] Monday through Friday 8am- 5pm  After hours call [phone]

## 2018-09-08 DIAGNOSIS — F329 Major depressive disorder, single episode, unspecified: Secondary | ICD-10-CM | POA: Insufficient documentation

## 2018-09-10 ENCOUNTER — Other Ambulatory Visit: Payer: Self-pay | Admitting: Internal Medicine

## 2018-09-10 ENCOUNTER — Inpatient Hospital Stay: Payer: Medicare Other | Attending: Internal Medicine

## 2018-09-10 ENCOUNTER — Encounter: Payer: Self-pay | Admitting: Adult Health

## 2018-09-10 ENCOUNTER — Non-Acute Institutional Stay (SKILLED_NURSING_FACILITY): Payer: Medicare Other | Admitting: Adult Health

## 2018-09-10 DIAGNOSIS — I4891 Unspecified atrial fibrillation: Secondary | ICD-10-CM | POA: Insufficient documentation

## 2018-09-10 DIAGNOSIS — K5909 Other constipation: Secondary | ICD-10-CM | POA: Diagnosis not present

## 2018-09-10 DIAGNOSIS — C713 Malignant neoplasm of parietal lobe: Secondary | ICD-10-CM | POA: Diagnosis not present

## 2018-09-10 DIAGNOSIS — F329 Major depressive disorder, single episode, unspecified: Secondary | ICD-10-CM | POA: Diagnosis not present

## 2018-09-10 DIAGNOSIS — Z7901 Long term (current) use of anticoagulants: Secondary | ICD-10-CM | POA: Insufficient documentation

## 2018-09-10 DIAGNOSIS — I10 Essential (primary) hypertension: Secondary | ICD-10-CM | POA: Insufficient documentation

## 2018-09-10 DIAGNOSIS — C719 Malignant neoplasm of brain, unspecified: Secondary | ICD-10-CM

## 2018-09-10 DIAGNOSIS — Z923 Personal history of irradiation: Secondary | ICD-10-CM | POA: Insufficient documentation

## 2018-09-10 DIAGNOSIS — G40109 Localization-related (focal) (partial) symptomatic epilepsy and epileptic syndromes with simple partial seizures, not intractable, without status epilepticus: Secondary | ICD-10-CM | POA: Diagnosis not present

## 2018-09-10 DIAGNOSIS — Z87891 Personal history of nicotine dependence: Secondary | ICD-10-CM | POA: Insufficient documentation

## 2018-09-10 DIAGNOSIS — C711 Malignant neoplasm of frontal lobe: Secondary | ICD-10-CM | POA: Diagnosis not present

## 2018-09-10 DIAGNOSIS — Z79899 Other long term (current) drug therapy: Secondary | ICD-10-CM | POA: Insufficient documentation

## 2018-09-10 NOTE — Progress Notes (Signed)
Location:   [facility] Room Number: [place] of Service:  SNF (31)   CODE STATUS: Full Code  Allergies  Allergen Reactions  . Nitroglycerin Other (See Comments)    Can only use the patch  Turned lobster red and felt faint with sublingual NTG  . Penicillins Hives    Hives Has patient had a PCN reaction causing immediate rash, facial/tongue/throat swelling, SOB or lightheadedness with hypotension: YES Has patient had a PCN reaction causing severe rash involving mucus membranes or skin necrosis: NO Has patient had a PCN reaction that required hospitalization NO Has patient had a PCN reaction occurring within the last 10 years: No If all of the above answers are "NO", then may proceed with Cephalosporin use.  . Biaxin [Clarithromycin] Rash    Rash   . Latex Rash  . Lidocaine Swelling    Eye swelling Tolerates Tetracaine with epidural steroid injections (04/10/17)  . Nickel Rash  . Oxycodone Hives  . Tape Rash  . Tetracyclines & Related Rash       . Tizanidine Rash    Chief Complaint  Patient presents with  . Medical Management of Chronic Issues    glioblastoma of parietal lobe; epilepsia partialis continua; reactive depression (situational) chronic constipation.     HPI:  She is a 77 year old long term resident of this facility being seen for the management of her chronic illnesses: glioblastoma; epilepsy; depression; constipation. She has recently been started on zoloft for her situational depression. She is not voicing depressive thoughts today; she continues to require assistance with eating. She denies any uncontrolled pain.   Past Medical History:  Diagnosis Date  . A-fib ([place])   . Arthritis   . Breast cancer ([place]) 2002   RT LUMPECTOMY  . Bronchitis   . Carotid artery narrowings   . 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 Carotid artery occlusion    RIGHT 50% BLOCKAGE  . Colon polyps   . Complication of anesthesia    shaking/  FOLLOWING LOCAL AT DENTIST , drop O2 sats TO 50% DURING COLONOSCOPY  . Coronary artery disease   . Diverticulosis   . Dysrhythmia   . Environmental allergies   . GERD (gastroesophageal reflux disease)   . History of hiatal hernia   . History of radiation therapy 07/23/18- 08/10/18   Left parietal brain tumor. 40.05 Gy in 15 fractions.   . Hyperlipidemia   . Hypertension   . IBS (irritable bowel syndrome)   . Mitral valve disorder   . Shortness of breath dyspnea   . Syncope and collapse   . UTI (lower urinary tract infection)     Past Surgical History:  Procedure Laterality Date  . APPENDECTOMY    . BREAST BIOPSY Right 2002   POS  . BREAST CYST ASPIRATION Right   . BREAST EXCISIONAL BIOPSY Left 1997   NEG  . BREAST LUMPECTOMY    . CATARACT EXTRACTION W/PHACO Right 06/07/2016   Procedure: CATARACT EXTRACTION PHACO AND INTRAOCULAR LENS PLACEMENT (IOC);  Surgeon: Birder Robson, MD;  Location: [facility] ORS;  Service: Ophthalmology;  Laterality: Right;  [place] 00:51 AP% 19.6 CDE 10.09 Fluid pack lot # [id number]  . CATARACT EXTRACTION W/PHACO Left 07/05/2016   Procedure: CATARACT EXTRACTION PHACO AND INTRAOCULAR LENS PLACEMENT (IOC);  Surgeon: Birder Robson, MD;  Location: [facility] ORS;  Service: Ophthalmology;  Laterality: Left;  [place] 00:38 AP% 24.9 CDE 9.60 Fluid Pack lot # [id number]  .  CESAREAN SECTION    . TONSILLECTOMY    . TOTAL KNEE ARTHROPLASTY Left     Social History   Socioeconomic History  . Marital status: Married    Spouse name: Not on file  . Number of children: Not on file  . Years of education: Not on file  . Highest education level: Not on file  Occupational History  . Occupation: retired    Comment: Nurse, adult  . Financial resource strain: Not on file  . Food insecurity:    Worry: Not on file    Inability: Not on file  . Transportation needs:    Medical: Yes    Non-medical: Yes  Tobacco Use  . Smoking status: Former Smoker    Years: 10.00     Types: Cigarettes    Last attempt to quit: 10/03/1969    Years since quitting: 48.9  . Smokeless tobacco: Never Used  Substance and Sexual Activity  . Alcohol use: Not Currently    Alcohol/week: 0.0 standard drinks  . Drug use: No  . Sexual activity: Not on file  Lifestyle  . Physical activity:    Days per week: Not on file    Minutes per session: Not on file  . Stress: Not on file  Relationships  . Social connections:    Talks on phone: Not on file    Gets together: Not on file    Attends religious service: Not on file    Active member of club or organization: Not on file    Attends meetings of clubs or organizations: Not on file    Relationship status: Not on file  . Intimate partner violence:    Fear of current or ex partner: No    Emotionally abused: No    Physically abused: No    Forced sexual activity: No  Other Topics Concern  . Not on file  Social History Narrative  . Not on file   Family History  Problem Relation Age of Onset  . Lung cancer Father   . Prostate cancer Father   . Hyperlipidemia Brother   . Hypertension Brother   . Obesity Brother   . Heart disease Mother   . Lung cancer Sister   . Kidney disease Unknown        Maternal grandparent  . Breast cancer Neg Hx       VITAL SIGNS BP 114/72   Pulse 79   Temp 97.7 F (36.5 C)   Resp 20   Ht 5\' 4"  (1.626 m)   Wt 173 lb 14.4 oz (78.9 kg)   SpO2 91%   BMI 29.85 kg/m   Outpatient Encounter Medications as of 09/10/2018  Medication Sig  . acetaminophen (TYLENOL) 325 MG tablet Take 975 mg by mouth every 8 (eight) hours as needed.   Marland Kitchen apixaban (ELIQUIS) 2.5 MG TABS tablet Take 2.5 mg by mouth 2 (two) times daily.  Marland Kitchen atorvastatin (LIPITOR) 40 MG tablet Take 40 mg by mouth daily.  . clonazePAM (KLONOPIN) 0.25 MG disintegrating tablet Take 1 tablet (0.25 mg total) by mouth 2 (two) times daily.  Marland Kitchen dexamethasone (DECADRON) 2 MG tablet Take 2 mg by mouth every 12 (twelve) hours.  . docusate sodium  (COLACE) 100 MG capsule Take 100 mg by mouth 2 (two) times daily.  . famotidine (PEPCID) 20 MG tablet Take 20 mg by mouth 2 (two) times daily as needed.  . fenofibrate (TRICOR) 145 MG tablet Take 145 mg by mouth daily.  Marland Kitchen  guaiFENesin (ROBITUSSIN) 100 MG/5ML liquid Give 10 ml by mouth every 6 hours as needed for cough.  Notify provider if cough worsens / continues longer than 3 days  . ipratropium-albuterol (DUONEB) 0.5-2.5 (3) MG/3ML SOLN Take 3 mLs by nebulization every 6 (six) hours as needed.   . Lacosamide (VIMPAT) 150 MG TABS Take 1 tablet (150 mg total) by mouth every 12 (twelve) hours.  . levETIRAcetam (KEPPRA) 750 MG tablet Take 1,500 mg by mouth 2 (two) times daily.  Marland Kitchen LORazepam (ATIVAN) 1 MG tablet Take 1 tablet 20-30 min before radiotherapy, prn anxiety  . magnesium oxide (MAG-OX) 400 MG tablet Take 400 mg by mouth 2 (two) times daily.  . metoprolol tartrate (LOPRESSOR) 25 MG tablet Take 0.5 tablets (12.5 mg total) by mouth 2 (two) times daily.  . mineral oil (FLEET OIL) enema Give 1 enema rectally once daily every 3 days as needed if constipation not relieved by MOM or Bisacodyl suppository  . mirtazapine (REMERON) 15 MG tablet Take 7.5 mg by mouth at bedtime. 1/2 tab  . Multiple Vitamin (MULTIVITAMIN WITH MINERALS) TABS tablet Take 1 tablet by mouth daily.  . naphazoline-pheniramine (NAPHCON-A) 0.025-0.3 % ophthalmic solution Place 1 drop into both eyes 4 (four) times daily as needed for eye irritation.  . NON FORMULARY Diet Type: Regular  . nystatin (MYCOSTATIN) 100000 UNIT/ML suspension Take 10 mLs by mouth 4 (four) times daily. Swish for 30 seconds and spit for oral thrush  . ondansetron (ZOFRAN) 8 MG tablet Take 8 mg by mouth every 8 (eight) hours as needed for nausea.  . OXYGEN Inhale 2 L/min into the lungs as needed. To keep sats above 91%. Check O2 saturations prior to placing on patient and at least every 4 hours after applying.  Notify MD if oxygen saturations drop below  baseline while receiving oxygen or no improvement in dyspnea  . polyethylene glycol (MIRALAX / GLYCOLAX) packet Take 17 g by mouth daily.  . potassium chloride SA (K-DUR,KLOR-CON) 20 MEQ tablet Take 40 mEq by mouth daily. 2 caps  . sertraline (ZOLOFT) 50 MG tablet Take 50 mg by mouth daily. Ending on 09/13/18 then start 100 mg on 09/14/18  . sodium phosphate Pediatric (FLEET) 3.5-9.5 GM/59ML enema Insert 1 enema rectally once a day every 3 days for constipation not relieved MOM or Bisacodyl suppository  . torsemide (DEMADEX) 20 MG tablet Take 20 mg by mouth daily.   . traMADol (ULTRAM) 50 MG tablet Take 0.5 tablets (25 mg total) by mouth every 6 (six) hours as needed. If pain not relieved by tylenol  . WOUND DRESSINGS EX Triad wound dressing paste - Apply thin film topically two times daily to macerated buttocks   No facility-administered encounter medications on file as of 09/10/2018.      SIGNIFICANT DIAGNOSTIC EXAMS  PREVIOUS:   08-07-18: chest x-ray: mild plate like atelectasis versus pulmonary infiltrate left  lung base. Chronic lung changes and no significant change in calcified granuloma in left lung base.   09-06-18: MRI of brain:  Enlargement of dominant enhancing left parietal mass and surrounding satellite nodules with increased edema and possible nonenhancing tumor. Findings may reflect progressive tumor or acute post radiation changes.  NO NEW EXAMS   LABS REVIEWED: PREVIOUS:   06-06-18: wbc 7.0; hgb 13.6; hc6 39.5; mcv 96; plt 146 glucose 131; bun 13; creat 0.6; k+ 4.1; na++ 140; ca 9.5  08-07-18: wbc 4.5; hgb 12.7; hct 37.2; mcv 96.9; plt 142; glucose 112; bun 21; creat 0.69; k+  3.9; na++ 139; ca 9.4 liver normal albumin 3.4  08-28-18: glucose 100; bun 21 creat 0.65; k+ 3.4; na++ 138 ca 9.5   NO NEW LABS.    Review of Systems  Constitutional: Negative for malaise/fatigue.  Respiratory: Negative for cough and shortness of breath.   Cardiovascular: Negative for chest pain,  palpitations and leg swelling.  Gastrointestinal: Negative for abdominal pain, constipation and heartburn.  Musculoskeletal: Negative for back pain, joint pain and myalgias.  Skin: Negative.   Neurological: Positive for tremors and weakness. Negative for dizziness.  Psychiatric/Behavioral: Positive for depression. The patient is not nervous/anxious.    Physical Exam  Constitutional: She is oriented to person, place, and time. She appears well-developed and well-nourished. No distress.  Neck: No thyromegaly present.  Cardiovascular: Normal rate, regular rhythm, normal heart sounds and intact distal pulses.  Pulmonary/Chest: Effort normal and breath sounds normal. No respiratory distress.  Abdominal: Soft. Bowel sounds are normal. She exhibits no distension. There is no tenderness.  Musculoskeletal: She exhibits edema.  Has right hemiparesis Has trace right lower extremity edema    Right grip is weak is able to move arm Is unable to feed self; perform fine motor tasks    Lymphadenopathy:    She has no cervical adenopathy.  Neurological: She is alert and oriented to person, place, and time.  Skin: Skin is warm and dry. She is not diaphoretic.  Psychiatric: She has a normal mood and affect.    ASSESSMENT/ PLAN:  TODAY   1. 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 Chronic constipation: is stable will continue miralax daily   3. Reactive depression (situational): is without change will continue  klonopin 0.25 mg twice daily  remeron 7.5 mg nightly will continue zoloft 50 mg daily through 09-13-18 then 100 mg daily   4. Glioblastoma of parietal lobe (left) is declining with increased tremors and decreased function of right hand. ; will continue decadron 2 mg twice daily and has ultram 25 mg every 6 hours as needed. Is followed by oncology   PREVIOUS  5. Essential hypertension: is stable b/p  114/72  will continue  lopressor 12.5 mg twice daily   6. Chronic atrial fibrillation: heart rate is stable: will continue eliquis 2.5 mg twice daily and will continue lopressor 12.5 mg twice daily for rate control   7. Ischemic stroke of frontal lobe: is neurologically stable; has right hemiparesis: will continue eliquis 2.5 mg twice daily   8.  Hyperlipidemia: is stable will continue lipitor 40 mg daily and tricor 145 mg daily         MD is aware of resident's narcotic use and is in agreement with current plan of care. We will attempt to wean resident as apropriate   Ok Edwards NP [facility]  Contact [phone] Monday through Friday 8am- 5pm  After hours call [phone]

## 2018-09-11 ENCOUNTER — Telehealth: Payer: Self-pay | Admitting: *Deleted

## 2018-09-11 ENCOUNTER — Other Ambulatory Visit: Payer: Self-pay | Admitting: Adult Health

## 2018-09-11 ENCOUNTER — Inpatient Hospital Stay (HOSPITAL_BASED_OUTPATIENT_CLINIC_OR_DEPARTMENT_OTHER): Payer: Medicare Other | Admitting: Internal Medicine

## 2018-09-11 ENCOUNTER — Inpatient Hospital Stay: Payer: Medicare Other

## 2018-09-11 ENCOUNTER — Telehealth: Payer: Self-pay | Admitting: Internal Medicine

## 2018-09-11 VITALS — BP 119/71 | HR 69 | Temp 97.9°F | Resp 18

## 2018-09-11 DIAGNOSIS — Z79899 Other long term (current) drug therapy: Secondary | ICD-10-CM | POA: Diagnosis not present

## 2018-09-11 DIAGNOSIS — Z7901 Long term (current) use of anticoagulants: Secondary | ICD-10-CM | POA: Diagnosis not present

## 2018-09-11 DIAGNOSIS — Z923 Personal history of irradiation: Secondary | ICD-10-CM

## 2018-09-11 DIAGNOSIS — C713 Malignant neoplasm of parietal lobe: Secondary | ICD-10-CM | POA: Diagnosis not present

## 2018-09-11 DIAGNOSIS — I10 Essential (primary) hypertension: Secondary | ICD-10-CM | POA: Diagnosis not present

## 2018-09-11 DIAGNOSIS — I4891 Unspecified atrial fibrillation: Secondary | ICD-10-CM | POA: Diagnosis not present

## 2018-09-11 DIAGNOSIS — C719 Malignant neoplasm of brain, unspecified: Secondary | ICD-10-CM

## 2018-09-11 DIAGNOSIS — Z87891 Personal history of nicotine dependence: Secondary | ICD-10-CM

## 2018-09-11 LAB — CMP (CANCER CENTER ONLY)
ALK PHOS: 39 U/L (ref 38–126)
ALT: 15 U/L (ref 0–44)
AST: 19 U/L (ref 15–41)
Albumin: 3.3 g/dL — ABNORMAL LOW (ref 3.5–5.0)
Anion gap: 6 (ref 5–15)
BUN: 14 mg/dL (ref 8–23)
CALCIUM: 9.5 mg/dL (ref 8.9–10.3)
CHLORIDE: 105 mmol/L (ref 98–111)
CO2: 32 mmol/L (ref 22–32)
CREATININE: 0.76 mg/dL (ref 0.44–1.00)
GFR, Est AFR Am: >60 mL/min (ref >60)
Glucose, Bld: 120 mg/dL — ABNORMAL HIGH (ref 70–99)
Potassium: 3.2 mmol/L — ABNORMAL LOW (ref 3.5–5.1)
Sodium: 143 mmol/L (ref 135–145)
Total Bilirubin: 0.5 mg/dL (ref 0.3–1.2)
Total Protein: 6 g/dL — ABNORMAL LOW (ref 6.5–8.1)

## 2018-09-11 LAB — CBC WITH DIFFERENTIAL (CANCER CENTER ONLY)
Abs Immature Granulocytes: 0.06 10*3/uL (ref 0.00–0.07)
Basophils Absolute: 0 10*3/uL (ref 0.0–0.1)
Basophils Relative: 0 %
EOS ABS: 0.2 10*3/uL (ref 0.0–0.5)
EOS PCT: 4 %
HEMATOCRIT: 37.9 % (ref 36.0–46.0)
HEMOGLOBIN: 12.1 g/dL (ref 12.0–15.0)
Immature Granulocytes: 1 %
LYMPHS ABS: 1.7 10*3/uL (ref 0.7–4.0)
LYMPHS PCT: 31 %
MCH: 32.4 pg (ref 26.0–34.0)
MCHC: 31.9 g/dL (ref 30.0–36.0)
MCV: 101.3 fL — ABNORMAL HIGH (ref 80.0–100.0)
Monocytes Absolute: 0.8 10*3/uL (ref 0.1–1.0)
Monocytes Relative: 15 %
Neutro Abs: 2.8 10*3/uL (ref 1.7–7.7)
Neutrophils Relative %: 49 %
Platelet Count: 177 10*3/uL (ref 150–400)
RBC: 3.74 MIL/uL — ABNORMAL LOW (ref 3.87–5.11)
RDW: 14.7 % (ref 11.5–15.5)
WBC: 5.7 10*3/uL (ref 4.0–10.5)
nRBC: 0 % (ref 0.0–0.2)

## 2018-09-11 MED ORDER — CLONAZEPAM 0.25 MG PO TBDP: 0.2500 mg | ORAL_TABLET | Freq: Two times a day (BID) | ORAL | 0 refills | Status: DC

## 2018-09-11 NOTE — Progress Notes (Signed)
Isabel Hernandez at [place], [place] [address] [phone]   Interval Evaluation  Date of Service: 09/11/18 Patient Name: Isabel Hernandez Patient MRN: [id number] Patient DOB: 04-Apr-1941 Provider: Ventura Sellers, MD  Identifying Statement:  Isabel Hernandez is a 77 y.o. female with left parietal glioblastoma   Oncologic History:  05/31/2018: Left frontal parietal stereotactic brain biopsy by Dr. Cathleen Fears. 07/09/2018: Status post 1 cycle of Temodar with disease progression. Recommend she proceed with radiation, 3 week course   Biomarkers:  MGMT Unknown.  IDH 1/2 Wild type.  EGFR Unknown  TERT Unknown   Interval History:  Isabel Hernandez presents today for follow up, now having completed radiation therapy.  In the past 3 days she has experienced decline of function with her right hand.  She is no longer able to toilet independently or brush her teeth with the right hand.  She is able to lift the arm but it is weaker than the left.  In addition, she describes difficutly "getting words out" and "finding words" at times.  This is a brand new symptom.  Recently visited with Dr. Caryl Pina at [facility].  She otherwise denies headaches or seizures.  Compliant with Keppra, Vimpat, Eliquis.  Medications: Current Outpatient Medications on File Prior to Visit  Medication Sig Dispense Refill  . acetaminophen (TYLENOL) 325 MG tablet Take 975 mg by mouth every 8 (eight) hours as needed.     Marland Kitchen apixaban (ELIQUIS) 2.5 MG TABS tablet Take 2.5 mg by mouth 2 (two) times daily.    Marland Kitchen atorvastatin (LIPITOR) 40 MG tablet Take 40 mg by mouth daily.    . clonazePAM (KLONOPIN) 0.25 MG disintegrating tablet Take 1 tablet (0.25 mg total) by mouth 2 (two) times daily. 60 tablet 0  . dexamethasone (DECADRON) 2 MG tablet Take 2 mg by mouth every 12 (twelve) hours.    . docusate sodium (COLACE) 100 MG capsule Take 100 mg by mouth 2 (two) times daily.    . famotidine  (PEPCID) 20 MG tablet Take 20 mg by mouth 2 (two) times daily as needed.    . fenofibrate (TRICOR) 145 MG tablet Take 145 mg by mouth daily.    Marland Kitchen guaiFENesin (ROBITUSSIN) 100 MG/5ML liquid Give 10 ml by mouth every 6 hours as needed for cough.  Notify provider if cough worsens / continues longer than 3 days    . ipratropium-albuterol (DUONEB) 0.5-2.5 (3) MG/3ML SOLN Take 3 mLs by nebulization every 6 (six) hours as needed.     . Lacosamide (VIMPAT) 150 MG TABS Take 1 tablet (150 mg total) by mouth every 12 (twelve) hours. 60 tablet 0  . levETIRAcetam (KEPPRA) 750 MG tablet Take 1,500 mg by mouth 2 (two) times daily.    . magnesium oxide (MAG-OX) 400 MG tablet Take 400 mg by mouth 2 (two) times daily.    . metoprolol tartrate (LOPRESSOR) 25 MG tablet Take 0.5 tablets (12.5 mg total) by mouth 2 (two) times daily. 5 tablet 0  . mineral oil (FLEET OIL) enema Give 1 enema rectally once daily every 3 days as needed if constipation not relieved by MOM or Bisacodyl suppository    . mirtazapine (REMERON) 15 MG tablet Take 7.5 mg by mouth at bedtime. 1/2 tab    . Multiple Vitamin (MULTIVITAMIN WITH MINERALS) TABS tablet Take 1 tablet by mouth daily.    . naphazoline-pheniramine (NAPHCON-A) 0.025-0.3 % ophthalmic solution Place 1 drop into both eyes 4 (  four) times daily as needed for eye irritation.    . NON FORMULARY Diet Type: Regular    . nystatin (MYCOSTATIN) 100000 UNIT/ML suspension Take 10 mLs by mouth 4 (four) times daily. Swish for 30 seconds and spit for oral thrush    . ondansetron (ZOFRAN) 8 MG tablet Take 8 mg by mouth every 8 (eight) hours as needed for nausea.    . polyethylene glycol (MIRALAX / GLYCOLAX) packet Take 17 g by mouth daily.    . potassium chloride SA (K-DUR,KLOR-CON) 20 MEQ tablet Take 40 mEq by mouth daily. 2 caps    . sertraline (ZOLOFT) 50 MG tablet Take 50 mg by mouth daily. Ending on 09/13/18 then start 100 mg on 09/14/18    . sodium phosphate Pediatric (FLEET) 3.5-9.5  GM/59ML enema Insert 1 enema rectally once a day every 3 days for constipation not relieved MOM or Bisacodyl suppository    . torsemide (DEMADEX) 20 MG tablet Take 20 mg by mouth daily.     . traMADol (ULTRAM) 50 MG tablet Take 0.5 tablets (25 mg total) by mouth every 6 (six) hours as needed. If pain not relieved by tylenol 60 tablet 0  . WOUND DRESSINGS EX Triad wound dressing paste - Apply thin film topically two times daily to macerated buttocks    . LORazepam (ATIVAN) 1 MG tablet Take 1 tablet 20-30 min before radiotherapy, prn anxiety (Patient not taking: Reported on 09/11/2018) 5 tablet 0  . OXYGEN Inhale 2 L/min into the lungs as needed. To keep sats above 91%. Check O2 saturations prior to placing on patient and at least every 4 hours after applying.  Notify MD if oxygen saturations drop below baseline while receiving oxygen or no improvement in dyspnea     No current facility-administered medications on file prior to visit.     Allergies:  Allergies  Allergen Reactions  . Nitroglycerin Other (See Comments)    Can only use the patch  Turned lobster red and felt faint with sublingual NTG  . Penicillins Hives    Hives Has patient had a PCN reaction causing immediate rash, facial/tongue/throat swelling, SOB or lightheadedness with hypotension: YES Has patient had a PCN reaction causing severe rash involving mucus membranes or skin necrosis: NO Has patient had a PCN reaction that required hospitalization NO Has patient had a PCN reaction occurring within the last 10 years: No If all of the above answers are "NO", then may proceed with Cephalosporin use.  . Biaxin [Clarithromycin] Rash    Rash   . Latex Rash  . Lidocaine Swelling    Eye swelling Tolerates Tetracaine with epidural steroid injections (04/10/17)  . Nickel Rash  . Oxycodone Hives  . Tape Rash  . Tetracyclines & Related Rash       . Tizanidine Rash   Past Medical History:  Past Medical History:  Diagnosis Date  .  A-fib ([place])   . Arthritis   . Breast cancer ([place]) 2002   RT LUMPECTOMY  . Bronchitis   . Carotid artery narrowings   . 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 Carotid artery occlusion    RIGHT 50% BLOCKAGE  . Colon polyps   . Complication of anesthesia    shaking/ FOLLOWING LOCAL AT DENTIST , drop O2 sats TO 50% DURING COLONOSCOPY  . Coronary artery disease   . Diverticulosis   . Dysrhythmia   . Environmental allergies   . GERD (gastroesophageal reflux disease)   . History of  hiatal hernia   . History of radiation therapy 07/23/18- 08/10/18   Left parietal brain tumor. 40.05 Gy in 15 fractions.   . Hyperlipidemia   . Hypertension   . IBS (irritable bowel syndrome)   . Mitral valve disorder   . Shortness of breath dyspnea   . Syncope and collapse   . UTI (lower urinary tract infection)    Past Surgical History:  Past Surgical History:  Procedure Laterality Date  . APPENDECTOMY    . BREAST BIOPSY Right 2002   POS  . BREAST CYST ASPIRATION Right   . BREAST EXCISIONAL BIOPSY Left 1997   NEG  . BREAST LUMPECTOMY    . CATARACT EXTRACTION W/PHACO Right 06/07/2016   Procedure: CATARACT EXTRACTION PHACO AND INTRAOCULAR LENS PLACEMENT (IOC);  Surgeon: Birder Robson, MD;  Location: [facility] ORS;  Service: Ophthalmology;  Laterality: Right;  [place] 00:51 AP% 19.6 CDE 10.09 Fluid pack lot # [id number]  . CATARACT EXTRACTION W/PHACO Left 07/05/2016   Procedure: CATARACT EXTRACTION PHACO AND INTRAOCULAR LENS PLACEMENT (IOC);  Surgeon: Birder Robson, MD;  Location: [facility] ORS;  Service: Ophthalmology;  Laterality: Left;  [place] 00:38 AP% 24.9 CDE 9.60 Fluid Pack lot # [id number]  . CESAREAN SECTION    . TONSILLECTOMY    . TOTAL KNEE ARTHROPLASTY Left    Social History:  Social History   Socioeconomic History  . Marital status: Married    Spouse name: Not on file  . Number of children: Not on file  . Years of education: Not on file  . Highest education level: Not on file    Occupational History  . Occupation: retired    Comment: Nurse, adult  . Financial resource strain: Not on file  . Food insecurity:    Worry: Not on file    Inability: Not on file  . Transportation needs:    Medical: Yes    Non-medical: Yes  Tobacco Use  . Smoking status: Former Smoker    Years: 10.00    Types: Cigarettes    Last attempt to quit: 10/03/1969    Years since quitting: 48.9  . Smokeless tobacco: Never Used  Substance and Sexual Activity  . Alcohol use: Not Currently    Alcohol/week: 0.0 standard drinks  . Drug use: No  . Sexual activity: Not on file  Lifestyle  . Physical activity:    Days per week: Not on file    Minutes per session: Not on file  . Stress: Not on file  Relationships  . Social connections:    Talks on phone: Not on file    Gets together: Not on file    Attends religious service: Not on file    Active member of club or organization: Not on file    Attends meetings of clubs or organizations: Not on file    Relationship status: Not on file  . Intimate partner violence:    Fear of current or ex partner: No    Emotionally abused: No    Physically abused: No    Forced sexual activity: No  Other Topics Concern  . Not on file  Social History Narrative  . Not on file   Family History:  Family History  Problem Relation Age of Onset  . Lung cancer Father   . Prostate cancer Father   . Hyperlipidemia Brother   . Hypertension Brother   . Obesity Brother   . Heart disease Mother   . Lung cancer Sister   . Kidney  disease Unknown        Maternal grandparent  . Breast cancer Neg Hx     Review of Systems: Constitutional: Denies fevers, chills or abnormal weight loss Eyes: Denies blurriness of vision Ears, nose, mouth, throat, and face: Denies mucositis or sore throat Respiratory: Denies cough, dyspnea or wheezes Cardiovascular: Denies palpitation, chest discomfort or lower extremity swelling Gastrointestinal:  Denies nausea,  constipation, diarrhea GU: Denies dysuria or incontinence Skin: Denies abnormal skin rashes Neurological: Per HPI Musculoskeletal: Denies joint pain, back or neck discomfort. No decrease in ROM Behavioral/Psych: Denies anxiety, disturbance in thought content, and mood instability  Physical Exam: Vitals:   09/11/18 1119  BP: 119/71  Pulse: 69  Resp: 18  Temp: 97.9 F (36.6 C)  SpO2: 95%   KPS: 60. General: Alert, cooperative, pleasant, in no acute distress in wheelchair Head: Normal EENT: No conjunctival injection or scleral icterus. Oral mucosa moist Lungs: Resp effort normal Cardiac: Regular rate and rhythm Abdomen: Soft, non-distended abdomen Skin: No rashes cyanosis or petechiae. Extremities: +edema right leg which is in brace  Neurologic Exam: Mental Status: Awake, alert, attentive to examiner. Oriented to self and environment. Language has modest impairment in fluency with intact comprehension.  Cranial Nerves: Visual acuity is grossly normal. Visual fields are full. Extra-ocular movements intact. No ptosis. Face is symmetric, tongue midline. Motor: Tone and bulk are normal. Power is impaired in right leg, 0/5. Right arm is 4/5 proximally, 3/5 distally. Reflexes are symmetric, no pathologic reflexes present. Intact finger to nose bilaterally Sensory: Intact to light touch and temperature Gait: Deferred   Labs: I have reviewed the data as listed    Component Value Date/Time   NA 138 08/28/2018 1428   K 3.4 (L) 08/28/2018 1428   CL 97 (L) 08/28/2018 1428   CO2 31 08/28/2018 1428   GLUCOSE 100 (H) 08/28/2018 1428   BUN 21 08/28/2018 1428   CREATININE 0.65 08/28/2018 1428   CREATININE 0.71 07/30/2018 1111   CALCIUM 9.5 08/28/2018 1428   PROT 6.3 (L) 08/07/2018 1215   PROT 6.8 09/05/2016 1410   ALBUMIN 3.4 (L) 08/07/2018 1215   ALBUMIN 4.5 09/05/2016 1410   AST 33 08/07/2018 1215   AST 25 07/30/2018 1111   ALT 28 08/07/2018 1215   ALT 38 07/30/2018 1111    ALKPHOS 40 08/07/2018 1215   BILITOT 1.0 08/07/2018 1215   BILITOT 0.5 07/30/2018 1111   GFRNONAA >60 08/28/2018 1428   GFRNONAA >60 07/30/2018 1111   GFRAA >60 08/28/2018 1428   GFRAA >60 07/30/2018 1111   Lab Results  Component Value Date   WBC 5.7 09/11/2018   NEUTROABS 2.8 09/11/2018   HGB 12.1 09/11/2018   HCT 37.9 09/11/2018   MCV 101.3 (H) 09/11/2018   PLT 177 09/11/2018   Imaging:  [facility] Clinician Interpretation: I have personally reviewed the CNS images as listed.  My interpretation, in the context of the patient's clinical presentation, is treatment effect vs true progression  Mr Jeri Cos Wo Contrast  Result Date: 09/07/2018 CLINICAL DATA:  Left parietal glioblastoma, previously biopsied. Radiation treatment 07/23/2018-08/10/2018. EXAM: MRI HEAD WITHOUT AND WITH CONTRAST TECHNIQUE: Multiplanar, multiecho pulse sequences of the brain and surrounding structures were obtained without and with intravenous contrast. CONTRAST:  8 mL Gadavist COMPARISON:  07/05/2018 FINDINGS: Brain: Dominant left parietal lobe mass demonstrates irregular thick peripheral enhancement and has increased in size from the prior study, now measuring 4.4 x 2.2 x 4.2 cm. Adjacent satellite enhancing nodules have mildly increased with  the largest located anterior to the dominant mass and measuring 6 mm (series 16, image 113). The mass again extends to the atrium of the left lateral ventricle with small foci of subependymal enhancement as well as nonenhancing nodular T2 hyperintensity extending further inferiorly along the lateral margin of the atrium in a subependymal manner. There is also a new punctate focus of enhancement involving the posterior aspect of the corpus callosum in the midline (series 16, image 94). Nonenhancing T2 hyperintensity in the surrounding white matter of the parietal and posterior frontal lobes has greatly increased with regional sulcal effacement and mild mass effect on the left lateral  ventricle. A small amount of chronic blood products are again noted at the biopsy site. No distant enhancing lesions are identified. There is no evidence of acute infarct, midline shift, or extra-axial fluid collection. Chronic hemosiderin is again noted in the right central sulcus. There is moderate cerebral atrophy. Patchy T2 hyperintensities in the cerebral white matter bilaterally separate from the above described changes are similar to the prior study and nonspecific but compatible with mild-to-moderate chronic small vessel ischemic disease. Vascular: Major intracranial vascular flow voids are preserved. Skull and upper cervical spine: No suspicious marrow lesion. Left parietal burr hole. Sinuses/Orbits: Bilateral cataract extraction. Paranasal sinuses and mastoid air cells are clear. Other: None. IMPRESSION: Enlargement of dominant enhancing left parietal mass and surrounding satellite nodules with increased edema and possible nonenhancing tumor. Findings may reflect progressive tumor or acute post radiation changes. Electronically Signed   By: Logan Bores M.D.   On: 09/07/2018 08:46     Assessment/Plan 1. Glioblastoma of parietal lobe ([place])  Ms. 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    Per Dr. Pricilla Loveless recommendation, we will increase dexamethasone to 37m BID for 1 week.  Because of functional status, prior poor response to TMZ, and early cytopenia with exposure to alkylating chemo, there is no role for chemotherapy at this time.  She should return to clinic in 1 week for re-examination.  If improved will begin to taper down therapy.  If clinical condition is refractory will give consideration to utilizing Avastin.   We appreciate the opportunity to participate in the care of [place].   All questions were answered. The patient knows to call the clinic with any  problems, questions or concerns. No barriers to learning were detected.  The total time spent in the encounter was 40 minutes and more than 50% was on counseling and review of test results   ZVentura Sellers MD Medical Director of Neuro-[facility]12/10/19 11:33 AM

## 2018-09-11 NOTE — Telephone Encounter (Signed)
Gave patient avs report and appointments for December  °

## 2018-09-11 NOTE — Telephone Encounter (Signed)
The son called wanted to cancel appt for 09-14-18, and didn't want to rescheduled.

## 2018-09-12 ENCOUNTER — Other Ambulatory Visit: Payer: Self-pay | Admitting: Adult Health

## 2018-09-12 MED ORDER — LACOSAMIDE 150 MG PO TABS: 150.0000 mg | ORAL_TABLET | Freq: Two times a day (BID) | ORAL | 0 refills | Status: DC

## 2018-09-14 ENCOUNTER — Inpatient Hospital Stay
Admission: RE | Admit: 2018-09-14 | Discharge: 2018-09-14 | Disposition: A | Discharge status: Home / Self Care | Payer: Self-pay | Source: Ambulatory Visit | Attending: Radiation Oncology | Admitting: Radiation Oncology

## 2018-09-14 HISTORY — DX: Personal history of irradiation: Z92.3

## 2018-09-14 NOTE — Progress Notes (Signed)
Brain and Spine Tumor Board Documentation  Isabel Hernandez was presented by Cecil Cobbs, MD at Brain and Spine Tumor Board on 09/14/2018, which included representatives from neuro oncology, radiation oncology, surgical oncology, navigation, pathology, radiology.  Isabel Hernandez was presented as a current patient with history of the following treatments:  .  Additionally, we reviewed previous medical and familial history, history of present illness, and recent lab results along with all available histopathologic and imaging studies. The tumor board considered available treatment options and made the following recommendations:    steroids for suspected pseudo-progression.  Not candidate for chemotherapy  Tumor board is a meeting of clinicians from various specialty areas who evaluate and discuss patients for whom a multidisciplinary approach is being considered. Final determinations in the plan of care are those of the provider(s). The responsibility for follow up of recommendations given during tumor board is that of the provider.   Today's extended care, comprehensive team conference, Isabel Hernandez was not present for the discussion and was not examined.

## 2018-09-18 ENCOUNTER — Encounter: Payer: Self-pay | Admitting: Internal Medicine

## 2018-09-18 ENCOUNTER — Inpatient Hospital Stay (HOSPITAL_BASED_OUTPATIENT_CLINIC_OR_DEPARTMENT_OTHER): Payer: Medicare Other | Admitting: Internal Medicine

## 2018-09-18 VITALS — BP 126/73 | HR 60 | Temp 98.4°F | Resp 14

## 2018-09-18 DIAGNOSIS — Z923 Personal history of irradiation: Secondary | ICD-10-CM

## 2018-09-18 DIAGNOSIS — Z79899 Other long term (current) drug therapy: Secondary | ICD-10-CM

## 2018-09-18 DIAGNOSIS — I4891 Unspecified atrial fibrillation: Secondary | ICD-10-CM

## 2018-09-18 DIAGNOSIS — I10 Essential (primary) hypertension: Secondary | ICD-10-CM | POA: Diagnosis not present

## 2018-09-18 DIAGNOSIS — Z7901 Long term (current) use of anticoagulants: Secondary | ICD-10-CM

## 2018-09-18 DIAGNOSIS — Z87891 Personal history of nicotine dependence: Secondary | ICD-10-CM

## 2018-09-18 DIAGNOSIS — C713 Malignant neoplasm of parietal lobe: Secondary | ICD-10-CM

## 2018-09-18 NOTE — Progress Notes (Signed)
[facility] at [place], [place] [address] [phone]   Interval Evaluation  Date of Service: 09/18/18 Patient Name: Isabel Hernandez Patient MRN: [id number] Patient DOB: October 20, 1940 Provider: Ventura Sellers, MD  Identifying Statement:  Denny Lave is a 77 y.o. female with left parietal glioblastoma   Oncologic History:  05/31/2018: Left frontal parietal stereotactic brain biopsy by Dr. Cathleen Fears. 07/09/2018: Status post 1 cycle of Temodar with disease progression. Recommend she proceed with radiation, 3 week course   Biomarkers:  MGMT Unknown.  IDH 1/2 Wild type.  EGFR Unknown  TERT Unknown   Interval History:  Oneta Zuidema presents today for follow up after recent clinical changes. She continues to lose function of the right hand and arm, even since starting the steroids last week.  In fact her hand tremors (bilateral but right worse than left) have worsened since starting the medicine.  She is still able to lift the right arm but it is weaker than the left.  Word finding difficulty has not improved. She otherwise denies headaches or seizures.  Compliant with Keppra, Vimpat, Eliquis.  Medications: Current Outpatient Medications on File Prior to Visit  Medication Sig Dispense Refill  . acetaminophen (TYLENOL) 325 MG tablet Take 975 mg by mouth every 8 (eight) hours as needed.     Marland Kitchen apixaban (ELIQUIS) 2.5 MG TABS tablet Take 2.5 mg by mouth 2 (two) times daily.    Marland Kitchen atorvastatin (LIPITOR) 40 MG tablet Take 40 mg by mouth daily.    . clonazePAM (KLONOPIN) 0.25 MG disintegrating tablet Take 1 tablet (0.25 mg total) by mouth 2 (two) times daily. 60 tablet 0  . dexamethasone (DECADRON) 4 MG tablet Take 4 mg by mouth every 12 (twelve) hours.     . docusate sodium (COLACE) 100 MG capsule Take 100 mg by mouth 2 (two) times daily.    . famotidine (PEPCID) 20 MG tablet Take 20 mg by mouth 2 (two) times daily as needed.    . fenofibrate  (TRICOR) 145 MG tablet Take 145 mg by mouth daily.    Marland Kitchen guaiFENesin (ROBITUSSIN) 100 MG/5ML liquid Give 10 ml by mouth every 6 hours as needed for cough.  Notify provider if cough worsens / continues longer than 3 days    . ipratropium-albuterol (DUONEB) 0.5-2.5 (3) MG/3ML SOLN Take 3 mLs by nebulization every 6 (six) hours as needed.     . Lacosamide (VIMPAT) 150 MG TABS Take 1 tablet (150 mg total) by mouth every 12 (twelve) hours. 60 tablet 0  . levETIRAcetam (KEPPRA) 750 MG tablet Take 1,500 mg by mouth 2 (two) times daily.    . magnesium oxide (MAG-OX) 400 MG tablet Take 400 mg by mouth 2 (two) times daily.    . metoprolol tartrate (LOPRESSOR) 25 MG tablet Take 0.5 tablets (12.5 mg total) by mouth 2 (two) times daily. 5 tablet 0  . mineral oil (FLEET OIL) enema Give 1 enema rectally once daily every 3 days as needed if constipation not relieved by MOM or Bisacodyl suppository    . mirtazapine (REMERON) 15 MG tablet Take 7.5 mg by mouth at bedtime. 1/2 tab    . Multiple Vitamin (MULTIVITAMIN WITH MINERALS) TABS tablet Take 1 tablet by mouth daily.    . NON FORMULARY Diet Type: Regular    . nystatin (MYCOSTATIN) 100000 UNIT/ML suspension Take 10 mLs by mouth 4 (four) times daily. Swish for 30 seconds and spit for oral thrush    .  ondansetron (ZOFRAN) 8 MG tablet Take 8 mg by mouth every 8 (eight) hours as needed for nausea.    . polyethylene glycol (MIRALAX / GLYCOLAX) packet Take 17 g by mouth daily.    . potassium chloride SA (K-DUR,KLOR-CON) 20 MEQ tablet Take 40 mEq by mouth daily. 2 caps    . sertraline (ZOLOFT) 50 MG tablet Take 50 mg by mouth daily. Ending on 09/13/18 then start 100 mg on 09/14/18    . sodium phosphate Pediatric (FLEET) 3.5-9.5 GM/59ML enema Insert 1 enema rectally once a day every 3 days for constipation not relieved MOM or Bisacodyl suppository    . torsemide (DEMADEX) 20 MG tablet Take 20 mg by mouth daily.     . WOUND DRESSINGS EX Triad wound dressing paste - Apply  thin film topically two times daily to macerated buttocks    . LORazepam (ATIVAN) 1 MG tablet Take 1 tablet 20-30 min before radiotherapy, prn anxiety (Patient not taking: Reported on 09/11/2018) 5 tablet 0  . naphazoline-pheniramine (NAPHCON-A) 0.025-0.3 % ophthalmic solution Place 1 drop into both eyes 4 (four) times daily as needed for eye irritation.    . OXYGEN Inhale 2 L/min into the lungs as needed. To keep sats above 91%. Check O2 saturations prior to placing on patient and at least every 4 hours after applying.  Notify MD if oxygen saturations drop below baseline while receiving oxygen or no improvement in dyspnea    . traMADol (ULTRAM) 50 MG tablet Take 0.5 tablets (25 mg total) by mouth every 6 (six) hours as needed. If pain not relieved by tylenol (Patient not taking: Reported on 09/18/2018) 60 tablet 0   No current facility-administered medications on file prior to visit.     Allergies:  Allergies  Allergen Reactions  . Nitroglycerin Other (See Comments)    Can only use the patch  Turned lobster red and felt faint with sublingual NTG  . Penicillins Hives    Hives Has patient had a PCN reaction causing immediate rash, facial/tongue/throat swelling, SOB or lightheadedness with hypotension: YES Has patient had a PCN reaction causing severe rash involving mucus membranes or skin necrosis: NO Has patient had a PCN reaction that required hospitalization NO Has patient had a PCN reaction occurring within the last 10 years: No If all of the above answers are "NO", then may proceed with Cephalosporin use.  . Biaxin [Clarithromycin] Rash    Rash   . Latex Rash  . Lidocaine Swelling    Eye swelling Tolerates Tetracaine with epidural steroid injections (04/10/17)  . Nickel Rash  . Oxycodone Hives  . Tape Rash  . Tetracyclines & Related Rash       . Tizanidine Rash   Past Medical History:  Past Medical History:  Diagnosis Date  . A-fib ([place])   . Arthritis   . Breast cancer ([facility])  2002   RT LUMPECTOMY  . Bronchitis   . Carotid artery narrowings   . 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 Carotid artery occlusion    RIGHT 50% BLOCKAGE  . Colon polyps   . Complication of anesthesia    shaking/ FOLLOWING LOCAL AT DENTIST , drop O2 sats TO 50% DURING COLONOSCOPY  . Coronary artery disease   . Diverticulosis   . Dysrhythmia   . Environmental allergies   . GERD (gastroesophageal reflux disease)   . History of hiatal hernia   . History of radiation therapy 07/23/18- 08/10/18   Left parietal brain tumor.  40.05 Gy in 15 fractions.   . Hyperlipidemia   . Hypertension   . IBS (irritable bowel syndrome)   . Mitral valve disorder   . Shortness of breath dyspnea   . Syncope and collapse   . UTI (lower urinary tract infection)    Past Surgical History:  Past Surgical History:  Procedure Laterality Date  . APPENDECTOMY    . BREAST BIOPSY Right 2002   POS  . BREAST CYST ASPIRATION Right   . BREAST EXCISIONAL BIOPSY Left 1997   NEG  . BREAST LUMPECTOMY    . CATARACT EXTRACTION W/PHACO Right 06/07/2016   Procedure: CATARACT EXTRACTION PHACO AND INTRAOCULAR LENS PLACEMENT (IOC);  Surgeon: Birder Robson, MD;  Location: [facility] ORS;  Service: Ophthalmology;  Laterality: Right;  [place] 00:51 AP% 19.6 CDE 10.09 Fluid pack lot # [id number]  . CATARACT EXTRACTION W/PHACO Left 07/05/2016   Procedure: CATARACT EXTRACTION PHACO AND INTRAOCULAR LENS PLACEMENT (IOC);  Surgeon: Birder Robson, MD;  Location: [facility] ORS;  Service: Ophthalmology;  Laterality: Left;  [place] 00:38 AP% 24.9 CDE 9.60 Fluid Pack lot # [id number]  . CESAREAN SECTION    . TONSILLECTOMY    . TOTAL KNEE ARTHROPLASTY Left    Social History:  Social History   Socioeconomic History  . Marital status: Married    Spouse name: Not on file  . Number of children: Not on file  . Years of education: Not on file  . Highest education level: Not on file  Occupational History  . Occupation: retired    Comment: Horticulturist, commercial  . Financial resource strain: Not on file  . Food insecurity:    Worry: Not on file    Inability: Not on file  . Transportation needs:    Medical: Yes    Non-medical: Yes  Tobacco Use  . Smoking status: Former Smoker    Years: 10.00    Types: Cigarettes    Last attempt to quit: 10/03/1969    Years since quitting: 48.9  . Smokeless tobacco: Never Used  Substance and Sexual Activity  . Alcohol use: Not Currently    Alcohol/week: 0.0 standard drinks  . Drug use: No  . Sexual activity: Not on file  Lifestyle  . Physical activity:    Days per week: Not on file    Minutes per session: Not on file  . Stress: Not on file  Relationships  . Social connections:    Talks on phone: Not on file    Gets together: Not on file    Attends religious service: Not on file    Active member of club or organization: Not on file    Attends meetings of clubs or organizations: Not on file    Relationship status: Not on file  . Intimate partner violence:    Fear of current or ex partner: No    Emotionally abused: No    Physically abused: No    Forced sexual activity: No  Other Topics Concern  . Not on file  Social History Narrative  . Not on file   Family History:  Family History  Problem Relation Age of Onset  . Lung cancer Father   . Prostate cancer Father   . Hyperlipidemia Brother   . Hypertension Brother   . Obesity Brother   . Heart disease Mother   . Lung cancer Sister   . Kidney disease Unknown        Maternal grandparent  . Breast cancer Neg Hx  Review of Systems: Constitutional: Denies fevers, chills or abnormal weight loss Eyes: Denies blurriness of vision Ears, nose, mouth, throat, and face: Denies mucositis or sore throat Respiratory: Denies cough, dyspnea or wheezes Cardiovascular: Denies palpitation, chest discomfort or lower extremity swelling Gastrointestinal:  Denies nausea, constipation, diarrhea GU: Denies dysuria or incontinence Skin:  Denies abnormal skin rashes Neurological: Per HPI Musculoskeletal: Denies joint pain, back or neck discomfort. No decrease in ROM Behavioral/Psych: +anxiety  Physical Exam: Vitals:   09/18/18 1223  BP: 126/73  Pulse: 60  Resp: 14  Temp: 98.4 F (36.9 C)  SpO2: 96%   KPS: 60. General: Alert, cooperative, pleasant, in no acute distress in wheelchair Head: Normal EENT: No conjunctival injection or scleral icterus. Oral mucosa moist Lungs: Resp effort normal Cardiac: Regular rate and rhythm Abdomen: Soft, non-distended abdomen Skin: No rashes cyanosis or petechiae. Extremities: +edema right leg which is in brace  Neurologic Exam: Mental Status: Awake, alert, attentive to examiner. Oriented to self and environment. Language has modest impairment in fluency with intact comprehension.  Cranial Nerves: Visual acuity is grossly normal. Visual fields are full. Extra-ocular movements intact. No ptosis. Face is symmetric, tongue midline. Motor: Tone and bulk are normal. Power is impaired in right leg, 0/5. Right arm is 4/5 proximally, 3/5 distally.  Coarse bilateral action tremor R>L. Reflexes are symmetric, no pathologic reflexes present. Intact finger to nose bilaterally Sensory: Intact to light touch and temperature Gait: Deferred   Labs: I have reviewed the data as listed    Component Value Date/Time   NA 143 09/11/2018 1102   K 3.2 (L) 09/11/2018 1102   CL 105 09/11/2018 1102   CO2 32 09/11/2018 1102   GLUCOSE 120 (H) 09/11/2018 1102   BUN 14 09/11/2018 1102   CREATININE 0.76 09/11/2018 1102   CALCIUM 9.5 09/11/2018 1102   PROT 6.0 (L) 09/11/2018 1102   PROT 6.8 09/05/2016 1410   ALBUMIN 3.3 (L) 09/11/2018 1102   ALBUMIN 4.5 09/05/2016 1410   AST 19 09/11/2018 1102   ALT 15 09/11/2018 1102   ALKPHOS 39 09/11/2018 1102   BILITOT 0.5 09/11/2018 1102   GFRNONAA >60 09/11/2018 1102   GFRAA >60 09/11/2018 1102   Lab Results  Component Value Date   WBC 5.7 09/11/2018    NEUTROABS 2.8 09/11/2018   HGB 12.1 09/11/2018   HCT 37.9 09/11/2018   MCV 101.3 (H) 09/11/2018   PLT 177 09/11/2018     Assessment/Plan 1. Glioblastoma of parietal lobe ([facility])  Ms. Walkins has further clinical progression today.  Unfortunately she has not received any benefit from the pulse dexamethasone.  We recommended therefore decreasing decadron back to 53m daily.  We discussed alternate treatment pathways; specifically starting single agent avastin 197mkg IV q2 weeks, versus transitioning to palliative care or hospice.    Risks and benefits of avastin were discussed, including the uncertainty regarding mortality benefit in the single agent setting.  There is potential to control or improve clinical symptoms with avastin.  Also discussed increased risk of stroke, heart attack, and hemorrhage given her risk factor profile and anticoagulation use.      She and her husband will discuss together regarding their preference moving forward.  [place] in the next few days so these options can be discussed further.  We appreciate the opportunity to participate in the care of [place].   All questions were answered. The patient knows to call the clinic with any problems, questions or concerns. No  barriers to learning were detected.  The total time spent in the encounter was 25 minutes and more than 50% was on counseling and review of test results   Ventura Sellers, MD Medical Director of Neuro-Oncology [facility] at [place] 09/18/18 12:40 PM

## 2018-09-19 ENCOUNTER — Non-Acute Institutional Stay (SKILLED_NURSING_FACILITY): Payer: Medicare Other | Admitting: Adult Health

## 2018-09-19 ENCOUNTER — Encounter: Payer: Self-pay | Admitting: Adult Health

## 2018-09-19 ENCOUNTER — Telehealth: Payer: Self-pay

## 2018-09-19 DIAGNOSIS — F329 Major depressive disorder, single episode, unspecified: Secondary | ICD-10-CM | POA: Diagnosis not present

## 2018-09-19 DIAGNOSIS — G40109 Localization-related (focal) (partial) symptomatic epilepsy and epileptic syndromes with simple partial seizures, not intractable, without status epilepticus: Secondary | ICD-10-CM | POA: Diagnosis not present

## 2018-09-19 DIAGNOSIS — C713 Malignant neoplasm of parietal lobe: Secondary | ICD-10-CM | POA: Diagnosis not present

## 2018-09-19 NOTE — Telephone Encounter (Signed)
Per 12/17 no los °

## 2018-09-19 NOTE — Progress Notes (Signed)
[facility]   [facility] Room Number: [place] of Service:  SNF (31)   CODE STATUS: Full Code  Allergies  Allergen Reactions  . Nitroglycerin Other (See Comments)    Can only use the patch  Turned lobster red and felt faint with sublingual NTG  . Penicillins Hives    Hives Has patient had a PCN reaction causing immediate rash, facial/tongue/throat swelling, SOB or lightheadedness with hypotension: YES Has patient had a PCN reaction causing severe rash involving mucus membranes or skin necrosis: NO Has patient had a PCN reaction that required hospitalization NO Has patient had a PCN reaction occurring within the last 10 years: No If all of the above answers are "NO", then may proceed with Cephalosporin use.  . Biaxin [Clarithromycin] Rash    Rash   . Latex Rash  . Lidocaine Swelling    Eye swelling Tolerates Tetracaine with epidural steroid injections (04/10/17)  . Nickel Rash  . Oxycodone Hives  . Tape Rash  . Tetracyclines & Related Rash       . Tizanidine Rash    Chief Complaint  Patient presents with  . Acute Visit    Care Plan Meeting    HPI:  We have come together for her routine care plan meeting. She is not attending the meeting but her spouse is present. She saw oncology yesterday and her tumor is growing. She will continue decadron 2 mg twice daily to help with inflammation. She has had 2 falls without injury present. We have discussed hospice; and have discussed her advanced directives and her MOST form. He will discuss these issues with his and will bring back the MOST form when it is filled out. Her weight is presently stable.   Past Medical History:  Diagnosis Date  . A-fib ([place])   . Arthritis   . Breast cancer ([place]) 2002   RT LUMPECTOMY  . Bronchitis   . Carotid artery narrowings   . 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 Carotid artery occlusion    RIGHT 50% BLOCKAGE  . Colon polyps   . Complication of anesthesia     shaking/ FOLLOWING LOCAL AT DENTIST , drop O2 sats TO 50% DURING COLONOSCOPY  . Coronary artery disease   . Diverticulosis   . Dysrhythmia   . Environmental allergies   . GERD (gastroesophageal reflux disease)   . History of hiatal hernia   . History of radiation therapy 07/23/18- 08/10/18   Left parietal brain tumor. 40.05 Gy in 15 fractions.   . Hyperlipidemia   . Hypertension   . IBS (irritable bowel syndrome)   . Mitral valve disorder   . Shortness of breath dyspnea   . Syncope and collapse   . UTI (lower urinary tract infection)     Past Surgical History:  Procedure Laterality Date  . APPENDECTOMY    . BREAST BIOPSY Right 2002   POS  . BREAST CYST ASPIRATION Right   . BREAST EXCISIONAL BIOPSY Left 1997   NEG  . BREAST LUMPECTOMY    . CATARACT EXTRACTION W/PHACO Right 06/07/2016   Procedure: CATARACT EXTRACTION PHACO AND INTRAOCULAR LENS PLACEMENT (IOC);  Surgeon: Birder Robson, MD;  Location: [facility] ORS;  Service: Ophthalmology;  Laterality: Right;  [place] 00:51 AP% 19.6 CDE 10.09 Fluid pack lot # [id number]  . CATARACT EXTRACTION W/PHACO Left 07/05/2016   Procedure: CATARACT EXTRACTION PHACO AND INTRAOCULAR LENS PLACEMENT (IOC);  Surgeon: Birder Robson, MD;  Location: [facility] ORS;  Service: Ophthalmology;  Laterality: Left;  [place] 00:38 AP% 24.9 CDE 9.60 Fluid Pack lot # [id number]  . CESAREAN SECTION    . TONSILLECTOMY    . TOTAL KNEE ARTHROPLASTY Left     Social History   Socioeconomic History  . Marital status: Married    Spouse name: Not on file  . Number of children: Not on file  . Years of education: Not on file  . Highest education level: Not on file  Occupational History  . Occupation: retired    Comment: Nurse, adult  . Financial resource strain: Not on file  . Food insecurity:    Worry: Not on file    Inability: Not on file  . Transportation needs:    Medical: Yes    Non-medical: Yes  Tobacco Use  . Smoking status: Former Smoker     Years: 10.00    Types: Cigarettes    Last attempt to quit: 10/03/1969    Years since quitting: 48.9  . Smokeless tobacco: Never Used  Substance and Sexual Activity  . Alcohol use: Not Currently    Alcohol/week: 0.0 standard drinks  . Drug use: No  . Sexual activity: Not on file  Lifestyle  . Physical activity:    Days per week: Not on file    Minutes per session: Not on file  . Stress: Not on file  Relationships  . Social connections:    Talks on phone: Not on file    Gets together: Not on file    Attends religious service: Not on file    Active member of club or organization: Not on file    Attends meetings of clubs or organizations: Not on file    Relationship status: Not on file  . Intimate partner violence:    Fear of current or ex partner: No    Emotionally abused: No    Physically abused: No    Forced sexual activity: No  Other Topics Concern  . Not on file  Social History Narrative  . Not on file   Family History  Problem Relation Age of Onset  . Lung cancer Father   . Prostate cancer Father   . Hyperlipidemia Brother   . Hypertension Brother   . Obesity Brother   . Heart disease Mother   . Lung cancer Sister   . Kidney disease Other        Maternal grandparent  . Breast cancer Neg Hx       VITAL SIGNS BP 114/72   Pulse 79   Temp 97.7 F (36.5 C)   Resp 20   Ht 5\' 4"  (1.626 m)   Wt 171 lb 3.2 oz (77.7 kg)   SpO2 92%   BMI 29.39 kg/m   Outpatient Encounter Medications as of 09/19/2018  Medication Sig  . acetaminophen (TYLENOL) 325 MG tablet Take 975 mg by mouth every 8 (eight) hours as needed.   Marland Kitchen apixaban (ELIQUIS) 5 MG TABS tablet Take 5 mg by mouth 2 (two) times daily.   Marland Kitchen atorvastatin (LIPITOR) 40 MG tablet Take 40 mg by mouth daily.  . clonazePAM (KLONOPIN) 0.25 MG disintegrating tablet Take 1 tablet (0.25 mg total) by mouth 2 (two) times daily.  Marland Kitchen dexamethasone (DECADRON) 4 MG tablet Take 4 mg by mouth every 12 (twelve) hours.   . docusate  sodium (COLACE) 100 MG capsule Take 100 mg by mouth 2 (two) times daily.  . famotidine (PEPCID) 20 MG tablet Take 20 mg by  mouth 2 (two) times daily as needed.  . fenofibrate (TRICOR) 145 MG tablet Take 145 mg by mouth daily.  Marland Kitchen guaiFENesin (ROBITUSSIN) 100 MG/5ML liquid Give 10 ml by mouth every 6 hours as needed for cough.  Notify provider if cough worsens / continues longer than 3 days  . ipratropium-albuterol (DUONEB) 0.5-2.5 (3) MG/3ML SOLN Take 3 mLs by nebulization every 6 (six) hours as needed.   . Lacosamide (VIMPAT) 150 MG TABS Take 1 tablet (150 mg total) by mouth every 12 (twelve) hours.  . levETIRAcetam (KEPPRA) 750 MG tablet Take 1,500 mg by mouth 2 (two) times daily.  . magnesium oxide (MAG-OX) 400 MG tablet Take 400 mg by mouth 2 (two) times daily.  . metoprolol tartrate (LOPRESSOR) 25 MG tablet Take 0.5 tablets (12.5 mg total) by mouth 2 (two) times daily.  . mirtazapine (REMERON) 15 MG tablet Take 7.5 mg by mouth at bedtime. 1/2 tab  . Multiple Vitamin (MULTIVITAMIN WITH MINERALS) TABS tablet Take 1 tablet by mouth daily.  . naphazoline-pheniramine (NAPHCON-A) 0.025-0.3 % ophthalmic solution Place 1 drop into both eyes 4 (four) times daily as needed for eye irritation.  . NON FORMULARY Diet Type: Regular  . nystatin (MYCOSTATIN) 100000 UNIT/ML suspension Take 10 mLs by mouth 4 (four) times daily. Swish for 30 seconds and spit for oral thrush  . ondansetron (ZOFRAN) 8 MG tablet Take 8 mg by mouth every 8 (eight) hours as needed for nausea.  . OXYGEN Inhale 2 L/min into the lungs as needed. To keep sats above 91%. Check O2 saturations prior to placing on patient and at least every 4 hours after applying.  Notify MD if oxygen saturations drop below baseline while receiving oxygen or no improvement in dyspnea  . polyethylene glycol (MIRALAX / GLYCOLAX) packet Take 17 g by mouth daily.  . potassium chloride SA (K-DUR,KLOR-CON) 20 MEQ tablet Take 40 mEq by mouth daily. 2 caps  .  sertraline (ZOLOFT) 100 MG tablet Take 100 mg by mouth daily.  . sodium phosphate (FLEET) 7-19 GM/118ML ENEM Insert 1 enema rectally for constipation not relieved by MOM or Bisacodyl suppository  . torsemide (DEMADEX) 20 MG tablet Take 20 mg by mouth daily.   . traMADol (ULTRAM) 50 MG tablet Take 0.5 tablet (25 mg total) every 8 hours as needed for pain , if not relieved by Tylenol  . WOUND DRESSINGS EX Triad wound dressing paste - Apply thin film topically two times daily to macerated buttocks   No facility-administered encounter medications on file as of 09/19/2018.      SIGNIFICANT DIAGNOSTIC EXAMS  PREVIOUS:   08-07-18: chest x-ray: mild plate like atelectasis versus pulmonary infiltrate left  lung base. Chronic lung changes and no significant change in calcified granuloma in left lung base.   09-06-18: MRI of brain:  Enlargement of dominant enhancing left parietal mass and surrounding satellite nodules with increased edema and possible nonenhancing tumor. Findings may reflect progressive tumor or acute post radiation changes.  NO NEW EXAMS   LABS REVIEWED: PREVIOUS:   06-06-18: wbc 7.0; hgb 13.6; hc6 39.5; mcv 96; plt 146 glucose 131; bun 13; creat 0.6; k+ 4.1; na++ 140; ca 9.5  08-07-18: wbc 4.5; hgb 12.7; hct 37.2; mcv 96.9; plt 142; glucose 112; bun 21; creat 0.69; k+ 3.9; na++ 139; ca 9.4 liver normal albumin 3.4  08-28-18: glucose 100; bun 21 creat 0.65; k+ 3.4; na++ 138 ca 9.5   NO NEW LABS.    Review of Systems  Constitutional: Negative  for malaise/fatigue.  Respiratory: Negative for cough and shortness of breath.   Cardiovascular: Negative for chest pain, palpitations and leg swelling.  Gastrointestinal: Negative for abdominal pain, constipation and heartburn.  Musculoskeletal: Negative for back pain, joint pain and myalgias.  Skin: Negative.   Neurological: Positive for tremors. Negative for dizziness.  Psychiatric/Behavioral: The patient is not nervous/anxious.      Physical Exam Constitutional:      General: She is not in acute distress.    Appearance: Normal appearance. She is well-developed. She is not diaphoretic.  Neck:     Thyroid: No thyromegaly.  Cardiovascular:     Rate and Rhythm: Normal rate and regular rhythm.     Pulses: Normal pulses.     Heart sounds: Normal heart sounds.  Pulmonary:     Effort: Pulmonary effort is normal. No respiratory distress.     Breath sounds: Normal breath sounds.  Abdominal:     General: Bowel sounds are normal. There is no distension.     Palpations: Abdomen is soft.     Tenderness: There is no abdominal tenderness.  Musculoskeletal:     Right lower leg: Edema present.     Left lower leg: No edema.     Comments: Has right hemiparesis Has trace right lower extremity edema    Right grip is weak is able to move arm Is unable to feed self; perform fine motor tasks   Lymphadenopathy:     Cervical: No cervical adenopathy.  Skin:    General: Skin is warm and dry.  Neurological:     General: No focal deficit present.     Mental Status: She is alert and oriented to person, place, and time.  Psychiatric:        Mood and Affect: Mood normal.     ASSESSMENT/ PLAN:  TODAY   1. Epilepsia partialis continua:  2. Reactive depression (situational):  3. Glioblastoma of parietal lobe (left)   Will continue current medication Will continue current plan of care He and his wife will talk about her advanced directives and will consider hospice     MD is aware of resident's narcotic use and is in agreement with current plan of care. We will attempt to wean resident as apropriate   Ok Edwards NP [facility]  Contact [phone] Monday through Friday 8am- 5pm  After hours call [phone]

## 2018-09-24 ENCOUNTER — Other Ambulatory Visit: Payer: Self-pay | Admitting: Adult Health

## 2018-09-24 ENCOUNTER — Encounter: Payer: Self-pay | Admitting: Adult Health

## 2018-09-24 ENCOUNTER — Non-Acute Institutional Stay (SKILLED_NURSING_FACILITY): Payer: Medicare Other | Admitting: Adult Health

## 2018-09-24 DIAGNOSIS — G40109 Localization-related (focal) (partial) symptomatic epilepsy and epileptic syndromes with simple partial seizures, not intractable, without status epilepticus: Secondary | ICD-10-CM | POA: Diagnosis not present

## 2018-09-24 DIAGNOSIS — C713 Malignant neoplasm of parietal lobe: Secondary | ICD-10-CM | POA: Diagnosis not present

## 2018-09-24 MED ORDER — LORAZEPAM 2 MG/ML IJ SOLN: INTRAMUSCULAR | 0 refills | Status: DC

## 2018-09-24 MED ORDER — LACOSAMIDE 200 MG PO TABS: 200.0000 mg | ORAL_TABLET | Freq: Two times a day (BID) | ORAL | 0 refills | Status: DC

## 2018-09-24 NOTE — Progress Notes (Signed)
Location:   [facility] [place] Room Number: [address] Place of Service:  SNF (31)   CODE STATUS: FULL  Allergies  Allergen Reactions  . Nitroglycerin Other (See Comments)    Can only use the patch  Turned lobster red and felt faint with sublingual NTG  . Penicillins Hives    Hives Has patient had a PCN reaction causing immediate rash, facial/tongue/throat swelling, SOB or lightheadedness with hypotension: YES Has patient had a PCN reaction causing severe rash involving mucus membranes or skin necrosis: NO Has patient had a PCN reaction that required hospitalization NO Has patient had a PCN reaction occurring within the last 10 years: No If all of the above answers are "NO", then may proceed with Cephalosporin use.  . Biaxin [Clarithromycin] Rash    Rash   . Latex Rash  . Lidocaine Swelling    Eye swelling Tolerates Tetracaine with epidural steroid injections (04/10/17)  . Nickel Rash  . Oxycodone Hives  . Tape Rash  . Tetracyclines & Related Rash       . Tizanidine Rash    Chief Complaint  Patient presents with  . Acute Visit    Seizure Follow up    HPI:  She has had a seizure that lasted about 10 minutes. She tells me that she is getting weaker; which is expected. She denies any worsening anxiety or depressive thoughts there are no reports of changes in appetite; no fevers. We have discussed her medications and will adjust dosing as able and will start a PRN IM ativan for her seizures; she is aware that these changes could make her sleepy.   Past Medical History:  Diagnosis Date  . A-fib ([place])   . Arthritis   . Breast cancer ([place]) 2002   RT LUMPECTOMY  . Bronchitis   . Carotid artery narrowings   . 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 Carotid artery occlusion    RIGHT 50% BLOCKAGE  . Colon polyps   . Complication of anesthesia    shaking/ FOLLOWING LOCAL AT DENTIST , drop O2 sats TO 50% DURING COLONOSCOPY  . Coronary artery disease   .  Diverticulosis   . Dysrhythmia   . Environmental allergies   . GERD (gastroesophageal reflux disease)   . History of hiatal hernia   . History of radiation therapy 07/23/18- 08/10/18   Left parietal brain tumor. 40.05 Gy in 15 fractions.   . Hyperlipidemia   . Hypertension   . IBS (irritable bowel syndrome)   . Mitral valve disorder   . Shortness of breath dyspnea   . Syncope and collapse   . UTI (lower urinary tract infection)     Past Surgical History:  Procedure Laterality Date  . APPENDECTOMY    . BREAST BIOPSY Right 2002   POS  . BREAST CYST ASPIRATION Right   . BREAST EXCISIONAL BIOPSY Left 1997   NEG  . BREAST LUMPECTOMY    . CATARACT EXTRACTION W/PHACO Right 06/07/2016   Procedure: CATARACT EXTRACTION PHACO AND INTRAOCULAR LENS PLACEMENT (IOC);  Surgeon: Birder Robson, MD;  Location: [facility] ORS;  Service: Ophthalmology;  Laterality: Right;  [place] 00:51 AP% 19.6 CDE 10.09 Fluid pack lot # [id number]  . CATARACT EXTRACTION W/PHACO Left 07/05/2016   Procedure: CATARACT EXTRACTION PHACO AND INTRAOCULAR LENS PLACEMENT (IOC);  Surgeon: Birder Robson, MD;  Location: [facility] ORS;  Service: Ophthalmology;  Laterality: Left;  [place] 00:38 AP% 24.9 CDE 9.60 Fluid Pack lot # [id number]  . CESAREAN  SECTION    . TONSILLECTOMY    . TOTAL KNEE ARTHROPLASTY Left     Social History   Socioeconomic History  . Marital status: Married    Spouse name: Not on file  . Number of children: Not on file  . Years of education: Not on file  . Highest education level: Not on file  Occupational History  . Occupation: retired    Comment: Nurse, adult  . Financial resource strain: Not on file  . Food insecurity:    Worry: Not on file    Inability: Not on file  . Transportation needs:    Medical: Yes    Non-medical: Yes  Tobacco Use  . Smoking status: Former Smoker    Years: 10.00    Types: Cigarettes    Last attempt to quit: 10/03/1969    Years since quitting: 49.0  . Smokeless  tobacco: Never Used  Substance and Sexual Activity  . Alcohol use: Not Currently    Alcohol/week: 0.0 standard drinks  . Drug use: No  . Sexual activity: Not on file  Lifestyle  . Physical activity:    Days per week: Not on file    Minutes per session: Not on file  . Stress: Not on file  Relationships  . Social connections:    Talks on phone: Not on file    Gets together: Not on file    Attends religious service: Not on file    Active member of club or organization: Not on file    Attends meetings of clubs or organizations: Not on file    Relationship status: Not on file  . Intimate partner violence:    Fear of current or ex partner: No    Emotionally abused: No    Physically abused: No    Forced sexual activity: No  Other Topics Concern  . Not on file  Social History Narrative  . Not on file   Family History  Problem Relation Age of Onset  . Lung cancer Father   . Prostate cancer Father   . Hyperlipidemia Brother   . Hypertension Brother   . Obesity Brother   . Heart disease Mother   . Lung cancer Sister   . Kidney disease Other        Maternal grandparent  . Breast cancer Neg Hx       VITAL SIGNS BP 129/61   Pulse 76   Temp 97.8 F (36.6 C) (Oral)   Resp 20   Ht 5\' 4"  (1.626 m)   Wt 171 lb 3.2 oz (77.7 kg)   SpO2 97%   BMI 29.39 kg/m   Outpatient Encounter Medications as of 09/24/2018  Medication Sig  . acetaminophen (TYLENOL) 325 MG tablet Take 975 mg by mouth every 8 (eight) hours as needed.   Marland Kitchen apixaban (ELIQUIS) 5 MG TABS tablet Take 5 mg by mouth 2 (two) times daily.   Marland Kitchen atorvastatin (LIPITOR) 40 MG tablet Take 40 mg by mouth daily.  . clonazePAM (KLONOPIN) 0.25 MG disintegrating tablet Take 1 tablet (0.25 mg total) by mouth 2 (two) times daily.  Marland Kitchen dexamethasone (DECADRON) 2 MG tablet Take 2 mg by mouth 2 (two) times daily with a meal.  . docusate sodium (COLACE) 100 MG capsule Take 100 mg by mouth 2 (two) times daily.  . famotidine (PEPCID) 20  MG tablet Take 20 mg by mouth 2 (two) times daily as needed.  . fenofibrate (TRICOR) 145 MG tablet Take 145 mg  by mouth daily.  Marland Kitchen guaiFENesin (ROBITUSSIN) 100 MG/5ML liquid Give 10 ml by mouth every 6 hours as needed for cough.  Notify provider if cough worsens / continues longer than 3 days  . ipratropium-albuterol (DUONEB) 0.5-2.5 (3) MG/3ML SOLN Take 3 mLs by nebulization every 6 (six) hours as needed.   . Lacosamide (VIMPAT) 150 MG TABS Take 1 tablet (150 mg total) by mouth every 12 (twelve) hours.  . levETIRAcetam (KEPPRA) 750 MG tablet Take 1,500 mg by mouth 2 (two) times daily.  . magnesium oxide (MAG-OX) 400 MG tablet Take 400 mg by mouth 2 (two) times daily.  . metoprolol tartrate (LOPRESSOR) 25 MG tablet Take 0.5 tablets (12.5 mg total) by mouth 2 (two) times daily.  . mirtazapine (REMERON) 15 MG tablet Take 7.5 mg by mouth at bedtime. 1/2 tab  . Multiple Vitamin (MULTIVITAMIN WITH MINERALS) TABS tablet Take 1 tablet by mouth daily.  . naphazoline-pheniramine (NAPHCON-A) 0.025-0.3 % ophthalmic solution Place 1 drop into both eyes 4 (four) times daily as needed for eye irritation.  . NON FORMULARY Diet Type: Regular  . ondansetron (ZOFRAN) 8 MG tablet Take 8 mg by mouth every 8 (eight) hours as needed for nausea.  . OXYGEN Inhale 2 L/min into the lungs as needed. To keep sats above 91%. Check O2 saturations prior to placing on patient and at least every 4 hours after applying.  Notify MD if oxygen saturations drop below baseline while receiving oxygen or no improvement in dyspnea  . polyethylene glycol (MIRALAX / GLYCOLAX) packet Take 17 g by mouth daily.  . potassium chloride SA (K-DUR,KLOR-CON) 20 MEQ tablet Take 40 mEq by mouth daily. 2 caps  . sertraline (ZOLOFT) 100 MG tablet Take 100 mg by mouth daily.  . sodium phosphate (FLEET) 7-19 GM/118ML ENEM Insert 1 enema rectally for constipation not relieved by MOM or Bisacodyl suppository  . torsemide (DEMADEX) 20 MG tablet Take 20 mg by  mouth daily.   . traMADol (ULTRAM) 50 MG tablet Take 0.5 tablet (25 mg total) every 8 hours as needed for pain , if not relieved by Tylenol  . WOUND DRESSINGS EX Triad wound dressing paste - Apply thin film topically two times daily to macerated buttocks  . [DISCONTINUED] dexamethasone (DECADRON) 4 MG tablet Take 4 mg by mouth every 12 (twelve) hours.   . [DISCONTINUED] nystatin (MYCOSTATIN) 100000 UNIT/ML suspension Take 10 mLs by mouth 4 (four) times daily. Swish for 30 seconds and spit for oral thrush   No facility-administered encounter medications on file as of 09/24/2018.      SIGNIFICANT DIAGNOSTIC EXAMS  PREVIOUS:   08-07-18: chest x-ray: mild plate like atelectasis versus pulmonary infiltrate left  lung base. Chronic lung changes and no significant change in calcified granuloma in left lung base.   09-06-18: MRI of brain:  Enlargement of dominant enhancing left parietal mass and surrounding satellite nodules with increased edema and possible nonenhancing tumor. Findings may reflect progressive tumor or acute post radiation changes.  NO NEW EXAMS   LABS REVIEWED: PREVIOUS:   06-06-18: wbc 7.0; hgb 13.6; hc6 39.5; mcv 96; plt 146 glucose 131; bun 13; creat 0.6; k+ 4.1; na++ 140; ca 9.5  08-07-18: wbc 4.5; hgb 12.7; hct 37.2; mcv 96.9; plt 142; glucose 112; bun 21; creat 0.69; k+ 3.9; na++ 139; ca 9.4 liver normal albumin 3.4  08-28-18: glucose 100; bun 21 creat 0.65; k+ 3.4; na++ 138 ca 9.5   NO NEW LABS.    Review of Systems  Constitutional: Negative for malaise/fatigue.  Respiratory: Negative for cough and shortness of breath.   Cardiovascular: Negative for chest pain, palpitations and leg swelling.  Gastrointestinal: Negative for abdominal pain, constipation and heartburn.  Musculoskeletal: Negative for back pain, joint pain and myalgias.  Skin: Negative.   Neurological: Positive for seizures and weakness. Negative for dizziness.  Psychiatric/Behavioral: The patient is not  nervous/anxious.      Physical Exam Constitutional:      General: She is not in acute distress.    Appearance: She is well-developed. She is not diaphoretic.  Neck:     Musculoskeletal: Neck supple.     Thyroid: No thyromegaly.  Cardiovascular:     Rate and Rhythm: Normal rate and regular rhythm.     Pulses: Normal pulses.     Heart sounds: Normal heart sounds.  Pulmonary:     Effort: Pulmonary effort is normal. No respiratory distress.     Breath sounds: Normal breath sounds.  Abdominal:     General: Bowel sounds are normal. There is no distension.     Palpations: Abdomen is soft.     Tenderness: There is no abdominal tenderness.  Musculoskeletal:     Right lower leg: Edema present.     Left lower leg: No edema.     Comments:  Has right hemiparesis Has trace right lower extremity edema    Right grip is weak is able to move arm Is unable to feed self; perform fine motor tasks    Lymphadenopathy:     Cervical: No cervical adenopathy.  Skin:    General: Skin is warm and dry.  Neurological:     Mental Status: She is alert and oriented to person, place, and time.  Psychiatric:        Mood and Affect: Mood normal.     ASSESSMENT/ PLAN:  TODAY   1. Epilepsia partialis continua:  2. Glioblastoma of parietal lobe (left)   Will increase her vimpat to 200 mg twice daily which will max out this medication Will begin her on ativan 1 mg IM every 30 minutes as needed for seizures lasting more than one minute    MD is aware of resident's narcotic use and is in agreement with current plan of care. We will attempt to wean resident as apropriate   Ok Edwards NP [facility]  Contact [phone] Monday through Friday 8am- 5pm  After hours call [phone]

## 2018-09-27 ENCOUNTER — Encounter: Payer: Self-pay | Admitting: Nurse Practitioner

## 2018-09-27 ENCOUNTER — Non-Acute Institutional Stay: Payer: Medicare Other | Admitting: Nurse Practitioner

## 2018-09-27 VITALS — HR 82 | Resp 18

## 2018-09-27 DIAGNOSIS — R531 Weakness: Secondary | ICD-10-CM

## 2018-09-27 DIAGNOSIS — Z515 Encounter for palliative care: Secondary | ICD-10-CM | POA: Insufficient documentation

## 2018-09-27 DIAGNOSIS — R413 Other amnesia: Secondary | ICD-10-CM | POA: Diagnosis not present

## 2018-09-27 NOTE — Progress Notes (Signed)
[facility] Telephone: [phone] Fax: [phone]  PATIENT NAME: Isabel Hernandez DOB: 04/01/1941 MRN: [id number]  PRIMARY CARE PROVIDER:    Dr Ouida Sills; [facility] [place] REFERRING PROVIDER:  Dr Ouida Sills; [facility] [place]:   Lance Galas husband [phone]   ASSESSMENT:     I visited and observed Isabel Hernandez. We talked about purpose of palliative medicine visit. We talked about how she was feeling today. She endorses that she's doing okay a little forgetful. We talked about symptoms of pain, shortness of breath what she denies. She did confirm feeling fatigued at times. We talked a better appetite where she endorses has as she is having more trouble feeding herself secondary to Tremor. We talked about going home with her husband to their apartment on campus of [place]. We talked about her functional level now wheelchair dependent and requiring a Hoyer lift for transfers. She does require assistance with ADLs. We talked about residing at [facility] which she expressed is now her home. We talked about the challenges with progression and she briefly talked about brain tumor. Talked about role of palliative care and plan of care. She expressed she was thankful for palliative care visit and support. Asked if it was okay to contact her husband and she was in agreement  Isabel Hernandez came to the facility to visit his wife though agreed to meet with palliative care separately. We talked about purpose of palliative care visit. We talked about symptoms, appetite. We talked about her functional level declining overall. We talked about her cognitive worsening with more difficulty with speech being slow, processing and some confusion. We talked about glioblastoma and no further treatment available. He talked about the care plan meeting where it was brought up about hospice screening. We talked about Hospice Services at length and scenarios. We  talked about role of palliative medicine and plan of care. Isabel Hernandez endorses he will further discuss with Isabel Hernandez and I contact if they would like a hospice screening. He talked about family Dynamics his two sons will be visiting soon. We talked about the overall Decline and realistic expectations with progression. Therapeutic listening and emotional support provided. Questions answered to satisfaction. Contact information   RECOMMENDATIONS and PLAN:   1.Palliative care encounter Z51.5; Palliative medicine team will continue to support patient, patient's family, and medical team. Visit consisted of counseling and education dealing with the complex and emotionally intense issues of symptom management and palliative care in the setting of serious and potentially life-threatening illness  2. Memory loss R41.3 appears progressive secondary to glioblastoma. Medical goals to continue to focus on Comfort, redirecting with supportive measures.  3. Generalized weakness R53.1  secondary to glioblastoma continue encourage energy conservation and rest times.  I spent 75 minutes providing this consultation,  from 9:00am to 10:15am. More than 50% of the time in this consultation was spent coordinating communication.   HISTORY OF PRESENT ILLNESS:  Isabel Hernandez is a 77 y.o. year old female with multiple medical problems including Left parietal glioblastoma, CCA, mitral valve disorder, Tremor, coronary artery disease, it's a fibrillation, hypertension, breast cancer 2002 with bright lumpectomy, irritable bowel syndrome, arthritis, colon polyps, gerd, diverticulosis, how you tell her knee, osteoarthritis, L4, L5 disc bulge, hyperlipidemia, left total knee arthroplasty, cesarean sections, bilateral cataract extraction, tonsillectomy, appendectomy. 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 She did have a fall 8/19 / 2019 with right forearm distal fracture at home. She was presented to the tumor board on 10/14 / 2019 for treatment options including radiation therapy, chemotherapy had been a poor response. 11 / 5 / 2019 chest x-ray with mild  play atelectasis versus pulmonary infiltrates left lung chronic lung changes no significant change in calcified granuloma in left lower base. 12 / 5 / 2019 MRI of brain enlargement of dominant enhancing left parietal mass and surrounding satellite nodules with increase in edema and possible nonenhancing tumor. Findings reflective progressive tumor or acute post radiation changes. visit by primary provider 12 / 23 / 2019 for acute visit following a seizure lasting about 10 minutes. Initiated Ativan 1 mg IM Q 30 minutes as needed for seizures and increased vimpat to 200 mg bid. Staff reports no further seizures. At present time she is sitting in the wheelchair in her room. She appears debilitated, pleasant though chronically ill. She appears comfortable at present time. No visitors present. Palliative Care was asked to help address goals of care.   CODE STATUS: full  PPS: 40% HOSPICE ELIGIBILITY/DIAGNOSIS: possible with clinical presentation, worsening symptoms  PAST MEDICAL HISTORY:  Past Medical History:  Diagnosis Date  . A-fib ([place])   . Arthritis   . Breast cancer ([place]) 2002   RT LUMPECTOMY  . Bronchitis   . Carotid artery narrowings   . 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 Carotid artery occlusion    RIGHT 50% BLOCKAGE  . Colon polyps   . Complication of anesthesia    shaking/ FOLLOWING LOCAL AT DENTIST , drop O2 sats TO 50% DURING COLONOSCOPY  . Coronary artery disease   . Diverticulosis   . Dysrhythmia   . Environmental allergies   . GERD (gastroesophageal reflux disease)   . History of hiatal hernia   . History of radiation therapy 07/23/18- 08/10/18   Left parietal brain  tumor. 40.05 Gy in 15 fractions.   . Hyperlipidemia   . Hypertension   . IBS (irritable bowel syndrome)   . Mitral valve disorder   . Shortness of breath dyspnea   . Syncope and collapse   . UTI (lower urinary tract infection)     SOCIAL HX:  Social History   Tobacco Use  . Smoking status: Former Smoker    Years: 10.00    Types: Cigarettes    Last attempt to quit: 10/03/1969    Years since quitting: 49.0  . Smokeless tobacco: Never Used  Substance Use Topics  . Alcohol use: Not Currently    Alcohol/week: 0.0 standard drinks    ALLERGIES:  Allergies  Allergen Reactions  . Nitroglycerin Other (See Comments)    Can only use the patch  Turned lobster red and felt faint with sublingual NTG  . Penicillins Hives    Hives Has patient had a PCN reaction causing immediate rash, facial/tongue/throat swelling, SOB or lightheadedness with hypotension: YES Has patient had a PCN reaction causing severe rash involving mucus membranes or skin necrosis: NO Has patient had a PCN reaction that required hospitalization NO Has patient had a PCN reaction occurring within the last 10 years: No If all of the above answers are "NO", then may proceed with Cephalosporin use.  . Biaxin [Clarithromycin] Rash    Rash   . Latex Rash  . Lidocaine Swelling    Eye swelling Tolerates Tetracaine with epidural steroid injections (  04/10/17)  . Nickel Rash  . Oxycodone Hives  . Tape Rash  . Tetracyclines & Related Rash       . Tizanidine Rash     PERTINENT MEDICATIONS:  Outpatient Encounter Medications as of 09/27/2018  Medication Sig  . acetaminophen (TYLENOL) 325 MG tablet Take 975 mg by mouth every 8 (eight) hours as needed.   Marland Kitchen apixaban (ELIQUIS) 5 MG TABS tablet Take 5 mg by mouth 2 (two) times daily.   Marland Kitchen atorvastatin (LIPITOR) 40 MG tablet Take 40 mg by mouth daily.  . clonazePAM (KLONOPIN) 0.25 MG disintegrating tablet Take 1 tablet (0.25 mg total) by mouth 2 (two) times daily.  Marland Kitchen dexamethasone  (DECADRON) 2 MG tablet Take 2 mg by mouth 2 (two) times daily with a meal.  . docusate sodium (COLACE) 100 MG capsule Take 100 mg by mouth 2 (two) times daily.  . famotidine (PEPCID) 20 MG tablet Take 20 mg by mouth 2 (two) times daily as needed.  . fenofibrate (TRICOR) 145 MG tablet Take 145 mg by mouth daily.  Marland Kitchen guaiFENesin (ROBITUSSIN) 100 MG/5ML liquid Give 10 ml by mouth every 6 hours as needed for cough.  Notify provider if cough worsens / continues longer than 3 days  . ipratropium-albuterol (DUONEB) 0.5-2.5 (3) MG/3ML SOLN Take 3 mLs by nebulization every 6 (six) hours as needed.   . lacosamide (VIMPAT) 200 MG TABS tablet Take 1 tablet (200 mg total) by mouth 2 (two) times daily.  Marland Kitchen levETIRAcetam (KEPPRA) 750 MG tablet Take 1,500 mg by mouth 2 (two) times daily.  Marland Kitchen LORazepam (ATIVAN) 2 MG/ML injection Give 1 mg IM every 30 minutes as needed for seizures that last for more than 60 seconds.  . magnesium oxide (MAG-OX) 400 MG tablet Take 400 mg by mouth 2 (two) times daily.  . metoprolol tartrate (LOPRESSOR) 25 MG tablet Take 0.5 tablets (12.5 mg total) by mouth 2 (two) times daily.  . mirtazapine (REMERON) 15 MG tablet Take 7.5 mg by mouth at bedtime. 1/2 tab  . Multiple Vitamin (MULTIVITAMIN WITH MINERALS) TABS tablet Take 1 tablet by mouth daily.  . naphazoline-pheniramine (NAPHCON-A) 0.025-0.3 % ophthalmic solution Place 1 drop into both eyes 4 (four) times daily as needed for eye irritation.  . NON FORMULARY Diet Type: Regular  . ondansetron (ZOFRAN) 8 MG tablet Take 8 mg by mouth every 8 (eight) hours as needed for nausea.  . OXYGEN Inhale 2 L/min into the lungs as needed. To keep sats above 91%. Check O2 saturations prior to placing on patient and at least every 4 hours after applying.  Notify MD if oxygen saturations drop below baseline while receiving oxygen or no improvement in dyspnea  . polyethylene glycol (MIRALAX / GLYCOLAX) packet Take 17 g by mouth daily.  . potassium chloride  SA (K-DUR,KLOR-CON) 20 MEQ tablet Take 40 mEq by mouth daily. 2 caps  . sertraline (ZOLOFT) 100 MG tablet Take 100 mg by mouth daily.  . sodium phosphate (FLEET) 7-19 GM/118ML ENEM Insert 1 enema rectally for constipation not relieved by MOM or Bisacodyl suppository  . torsemide (DEMADEX) 20 MG tablet Take 20 mg by mouth daily.   . traMADol (ULTRAM) 50 MG tablet Take 0.5 tablet (25 mg total) every 8 hours as needed for pain , if not relieved by Tylenol  . WOUND DRESSINGS EX Triad wound dressing paste - Apply thin film topically two times daily to macerated buttocks   No facility-administered encounter medications on file as of 09/27/2018.  PHYSICAL EXAM:   General: NAD, obese, debilitated, pleasant female Cardiovascular: regular rate and rhythm Pulmonary: clear ant fields Abdomen: obese,soft, nontender, + bowel sounds GU: no suprapubic tenderness Extremities: +mild edema, no joint deformities Skin: no rashes Neurological: Weakness but otherwise nonfocal; functionally impaired; w/c dependent; tremor  Isabel Merrick Ihor Gully, NP

## 2018-10-01 ENCOUNTER — Encounter: Payer: Self-pay | Admitting: Adult Health

## 2018-10-01 ENCOUNTER — Non-Acute Institutional Stay (SKILLED_NURSING_FACILITY): Payer: Medicare Other | Admitting: Adult Health

## 2018-10-01 DIAGNOSIS — C713 Malignant neoplasm of parietal lobe: Secondary | ICD-10-CM | POA: Diagnosis not present

## 2018-10-01 DIAGNOSIS — G40109 Localization-related (focal) (partial) symptomatic epilepsy and epileptic syndromes with simple partial seizures, not intractable, without status epilepticus: Secondary | ICD-10-CM | POA: Diagnosis not present

## 2018-10-01 NOTE — Progress Notes (Signed)
Location:   [facility] Room Number: [place] of Service:  SNF (31)   CODE STATUS: Full Code  Allergies  Allergen Reactions  . Nitroglycerin Other (See Comments)    Can only use the patch  Turned lobster red and felt faint with sublingual NTG  . Penicillins Hives    Hives Has patient had a PCN reaction causing immediate rash, facial/tongue/throat swelling, SOB or lightheadedness with hypotension: YES Has patient had a PCN reaction causing severe rash involving mucus membranes or skin necrosis: NO Has patient had a PCN reaction that required hospitalization NO Has patient had a PCN reaction occurring within the last 10 years: No If all of the above answers are "NO", then may proceed with Cephalosporin use.  . Biaxin [Clarithromycin] Rash    Rash   . Latex Rash  . Lidocaine Swelling    Eye swelling Tolerates Tetracaine with epidural steroid injections (04/10/17)  . Nickel Rash  . Oxycodone Hives  . Tape Rash  . Tetracyclines & Related Rash       . Tizanidine Rash    Chief Complaint  Patient presents with  . Acute Visit    Follow up chest xray    HPI:  She has had a cough over the past weekend. Her chest x-ray was negative. She states that she is not coughing today. She is having increased difficulty finding her words. There are no reports of fevers present   Past Medical History:  Diagnosis Date  . A-fib ([place])   . Arthritis   . Breast cancer ([place]) 2002   RT LUMPECTOMY  . Bronchitis   . Carotid artery narrowings   . 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 Carotid artery occlusion    RIGHT 50% BLOCKAGE  . Colon polyps   . Complication of anesthesia    shaking/ FOLLOWING LOCAL AT DENTIST , drop O2 sats TO 50% DURING COLONOSCOPY  . Coronary artery disease   . Diverticulosis   . Dysrhythmia   . Environmental allergies   . GERD (gastroesophageal reflux disease)   . History of hiatal hernia   . History of radiation therapy  07/23/18- 08/10/18   Left parietal brain tumor. 40.05 Gy in 15 fractions.   . Hyperlipidemia   . Hypertension   . IBS (irritable bowel syndrome)   . Mitral valve disorder   . Shortness of breath dyspnea   . Syncope and collapse   . UTI (lower urinary tract infection)     Past Surgical History:  Procedure Laterality Date  . APPENDECTOMY    . BREAST BIOPSY Right 2002   POS  . BREAST CYST ASPIRATION Right   . BREAST EXCISIONAL BIOPSY Left 1997   NEG  . BREAST LUMPECTOMY    . CATARACT EXTRACTION W/PHACO Right 06/07/2016   Procedure: CATARACT EXTRACTION PHACO AND INTRAOCULAR LENS PLACEMENT (IOC);  Surgeon: Birder Robson, MD;  Location: [facility] ORS;  Service: Ophthalmology;  Laterality: Right;  [place] 00:51 AP% 19.6 CDE 10.09 Fluid pack lot # [id number]  . CATARACT EXTRACTION W/PHACO Left 07/05/2016   Procedure: CATARACT EXTRACTION PHACO AND INTRAOCULAR LENS PLACEMENT (IOC);  Surgeon: Birder Robson, MD;  Location: [facility] ORS;  Service: Ophthalmology;  Laterality: Left;  [place] 00:38 AP% 24.9 CDE 9.60 Fluid Pack lot # [id number]  . CESAREAN SECTION    . TONSILLECTOMY    . TOTAL KNEE ARTHROPLASTY Left     Social History   Socioeconomic History  . Marital status: Married  Spouse name: Not on file  . Number of children: Not on file  . Years of education: Not on file  . Highest education level: Not on file  Occupational History  . Occupation: retired    Comment: Nurse, adult  . Financial resource strain: Not on file  . Food insecurity:    Worry: Not on file    Inability: Not on file  . Transportation needs:    Medical: Yes    Non-medical: Yes  Tobacco Use  . Smoking status: Former Smoker    Years: 10.00    Types: Cigarettes    Last attempt to quit: 10/03/1969    Years since quitting: 49.0  . Smokeless tobacco: Never Used  Substance and Sexual Activity  . Alcohol use: Not Currently    Alcohol/week: 0.0 standard drinks  . Drug use: No  . Sexual activity: Not on file    Lifestyle  . Physical activity:    Days per week: Not on file    Minutes per session: Not on file  . Stress: Not on file  Relationships  . Social connections:    Talks on phone: Not on file    Gets together: Not on file    Attends religious service: Not on file    Active member of club or organization: Not on file    Attends meetings of clubs or organizations: Not on file    Relationship status: Not on file  . Intimate partner violence:    Fear of current or ex partner: No    Emotionally abused: No    Physically abused: No    Forced sexual activity: No  Other Topics Concern  . Not on file  Social History Narrative  . Not on file   Family History  Problem Relation Age of Onset  . Lung cancer Father   . Prostate cancer Father   . Hyperlipidemia Brother   . Hypertension Brother   . Obesity Brother   . Heart disease Mother   . Lung cancer Sister   . Kidney disease Other        Maternal grandparent  . Breast cancer Neg Hx       VITAL SIGNS BP 129/61   Pulse 60   Temp (!) 97.3 F (36.3 C)   Resp 20   Ht 5\' 4"  (1.626 m)   Wt 172 lb 9.6 oz (78.3 kg)   SpO2 94%   BMI 29.63 kg/m   Outpatient Encounter Medications as of 10/01/2018  Medication Sig  . acetaminophen (TYLENOL) 325 MG tablet Take 975 mg by mouth every 8 (eight) hours as needed.   Marland Kitchen apixaban (ELIQUIS) 5 MG TABS tablet Take 5 mg by mouth 2 (two) times daily.   Marland Kitchen atorvastatin (LIPITOR) 40 MG tablet Take 40 mg by mouth daily.  . clonazePAM (KLONOPIN) 0.25 MG disintegrating tablet Take 1 tablet (0.25 mg total) by mouth 2 (two) times daily.  Marland Kitchen dexamethasone (DECADRON) 2 MG tablet Take 2 mg by mouth 2 (two) times daily with a meal.  . docusate sodium (COLACE) 100 MG capsule Take 100 mg by mouth 2 (two) times daily.  . famotidine (PEPCID) 20 MG tablet Take 20 mg by mouth 2 (two) times daily as needed.  . fenofibrate (TRICOR) 145 MG tablet Take 145 mg by mouth daily.  Marland Kitchen guaiFENesin (ROBITUSSIN) 100 MG/5ML liquid  Give 10 ml by mouth every 6 hours as needed for cough.  Notify provider if cough worsens / continues longer  than 3 days  . ipratropium-albuterol (DUONEB) 0.5-2.5 (3) MG/3ML SOLN Take 3 mLs by nebulization every 6 (six) hours as needed.   . lacosamide (VIMPAT) 200 MG TABS tablet Take 1 tablet (200 mg total) by mouth 2 (two) times daily.  Marland Kitchen levETIRAcetam (KEPPRA) 750 MG tablet Take 1,500 mg by mouth 2 (two) times daily.  Marland Kitchen LORazepam (ATIVAN) 2 MG/ML injection Give 1 mg IM every 30 minutes as needed for seizures that last for more than 60 seconds.  . magnesium oxide (MAG-OX) 400 MG tablet Take 400 mg by mouth 2 (two) times daily.  . metoprolol tartrate (LOPRESSOR) 25 MG tablet Take 0.5 tablets (12.5 mg total) by mouth 2 (two) times daily.  . mirtazapine (REMERON) 15 MG tablet Take 7.5 mg by mouth at bedtime. 1/2 tab  . Multiple Vitamin (MULTIVITAMIN WITH MINERALS) TABS tablet Take 1 tablet by mouth daily.  . naphazoline-pheniramine (NAPHCON-A) 0.025-0.3 % ophthalmic solution Place 1 drop into both eyes 4 (four) times daily as needed for eye irritation.  . NON FORMULARY Diet Type: Regular  . nystatin (MYCOSTATIN) 100000 UNIT/ML suspension Take 10 mLs by mouth 4 (four) times daily. Swish for 30 seconds and spit for oral thrush  . ondansetron (ZOFRAN) 8 MG tablet Take 8 mg by mouth every 8 (eight) hours as needed for nausea.  . OXYGEN Inhale 2 L/min into the lungs as needed. To keep sats above 91%. Check O2 saturations prior to placing on patient and at least every 4 hours after applying.  Notify MD if oxygen saturations drop below baseline while receiving oxygen or no improvement in dyspnea  . polyethylene glycol (MIRALAX / GLYCOLAX) packet Take 17 g by mouth daily.  . potassium chloride SA (K-DUR,KLOR-CON) 20 MEQ tablet Take 40 mEq by mouth daily. 2 caps  . sertraline (ZOLOFT) 100 MG tablet Take 100 mg by mouth daily.  . sodium phosphate (FLEET) 7-19 GM/118ML ENEM Insert 1 enema rectally for  constipation not relieved by MOM or Bisacodyl suppository  . torsemide (DEMADEX) 20 MG tablet Take 20 mg by mouth daily.   . traMADol (ULTRAM) 50 MG tablet Take 0.5 tablet (25 mg total) every 8 hours as needed for pain , if not relieved by Tylenol  . WOUND DRESSINGS EX Triad wound dressing paste - Apply thin film topically two times daily to macerated buttocks   No facility-administered encounter medications on file as of 10/01/2018.      SIGNIFICANT DIAGNOSTIC EXAMS  PREVIOUS:   08-07-18: chest x-ray: mild plate like atelectasis versus pulmonary infiltrate left  lung base. Chronic lung changes and no significant change in calcified granuloma in left lung base.   09-06-18: MRI of brain:  Enlargement of dominant enhancing left parietal mass and surrounding satellite nodules with increased edema and possible nonenhancing tumor. Findings may reflect progressive tumor or acute post radiation changes.  TODAY:   09-27-18: KUB; nonspecific bowel gas pattern with no definite acute intrabdominal  Pathology  09-30-18: chest x-ray: no active disease seen in chest    LABS REVIEWED: PREVIOUS:   06-06-18: wbc 7.0; hgb 13.6; hc6 39.5; mcv 96; plt 146 glucose 131; bun 13; creat 0.6; k+ 4.1; na++ 140; ca 9.5  08-07-18: wbc 4.5; hgb 12.7; hct 37.2; mcv 96.9; plt 142; glucose 112; bun 21; creat 0.69; k+ 3.9; na++ 139; ca 9.4 liver normal albumin 3.4  08-28-18: glucose 100; bun 21 creat 0.65; k+ 3.4; na++ 138 ca 9.5   NO NEW LABS.    Review of Systems  Constitutional: Negative for malaise/fatigue.  Respiratory: Negative for cough and shortness of breath.   Cardiovascular: Negative for chest pain, palpitations and leg swelling.  Gastrointestinal: Negative for abdominal pain, constipation and heartburn.  Musculoskeletal: Negative for back pain, joint pain and myalgias.  Skin: Negative.   Neurological: Positive for weakness. Negative for dizziness.  Psychiatric/Behavioral: The patient is not  nervous/anxious.       Physical Exam Constitutional:      General: She is not in acute distress.    Appearance: Normal appearance. She is well-developed. She is not diaphoretic.  Neck:     Musculoskeletal: Neck supple.     Thyroid: No thyromegaly.  Cardiovascular:     Rate and Rhythm: Normal rate and regular rhythm.     Pulses: Normal pulses.     Heart sounds: Normal heart sounds.  Pulmonary:     Effort: Pulmonary effort is normal. No respiratory distress.     Breath sounds: Normal breath sounds.  Abdominal:     General: Bowel sounds are normal. There is no distension.     Palpations: Abdomen is soft.     Tenderness: There is no abdominal tenderness.  Musculoskeletal:     Right lower leg: Edema present.     Left lower leg: No edema.     Comments: Has right hemiparesis Has trace right lower extremity edema    Right grip is weak is able to move arm Is unable to feed self; perform fine motor tasks     Lymphadenopathy:     Cervical: No cervical adenopathy.  Skin:    General: Skin is warm and dry.  Neurological:     Mental Status: She is alert and oriented to person, place, and time.  Psychiatric:        Mood and Affect: Mood normal.      ASSESSMENT/ PLAN:  TODAY   1. Epilepsia partialis continua:  2. Glioblastoma of parietal lobe (left)   Will continue her current medication regimen  Will continue to monitor her status.   MD is aware of resident's narcotic use and is in agreement with current plan of care. We will attempt to wean resident as apropriate   Ok Edwards NP [facility]  Contact [phone] Monday through Friday 8am- 5pm  After hours call [phone]

## 2018-10-04 ENCOUNTER — Other Ambulatory Visit
Admission: RE | Admit: 2018-10-04 | Discharge: 2018-10-04 | Disposition: A | Discharge status: Home / Self Care | Payer: Medicare Other | Source: Skilled Nursing Facility | Attending: Adult Health | Admitting: Adult Health

## 2018-10-04 ENCOUNTER — Other Ambulatory Visit: Payer: Self-pay | Admitting: Adult Health

## 2018-10-04 DIAGNOSIS — E876 Hypokalemia: Secondary | ICD-10-CM | POA: Diagnosis present

## 2018-10-04 LAB — BASIC METABOLIC PANEL
ANION GAP: 7 (ref 5–15)
BUN: 13 mg/dL (ref 8–23)
CO2: 34 mmol/L — ABNORMAL HIGH (ref 22–32)
Calcium: 9.4 mg/dL (ref 8.9–10.3)
Chloride: 100 mmol/L (ref 98–111)
Creatinine, Ser: 0.62 mg/dL (ref 0.44–1.00)
GFR calc Af Amer: >60 mL/min (ref >60)
GFR calc non Af Amer: >60 mL/min (ref >60)
Glucose, Bld: 109 mg/dL — ABNORMAL HIGH (ref 70–99)
Potassium: 3.2 mmol/L — ABNORMAL LOW (ref 3.5–5.1)
Sodium: 141 mmol/L (ref 135–145)

## 2018-10-08 ENCOUNTER — Non-Acute Institutional Stay (SKILLED_NURSING_FACILITY): Payer: Medicare Other | Admitting: Adult Health

## 2018-10-08 ENCOUNTER — Encounter: Payer: Self-pay | Admitting: Adult Health

## 2018-10-08 DIAGNOSIS — F329 Major depressive disorder, single episode, unspecified: Secondary | ICD-10-CM | POA: Diagnosis not present

## 2018-10-08 DIAGNOSIS — C713 Malignant neoplasm of parietal lobe: Secondary | ICD-10-CM

## 2018-10-08 DIAGNOSIS — G40109 Localization-related (focal) (partial) symptomatic epilepsy and epileptic syndromes with simple partial seizures, not intractable, without status epilepticus: Secondary | ICD-10-CM | POA: Diagnosis not present

## 2018-10-08 NOTE — Progress Notes (Signed)
[facility]   [facility] Room Number: [place] of Service:  SNF (31)   CODE STATUS: Full Code  Allergies  Allergen Reactions  . Nitroglycerin Other (See Comments)    Can only use the patch  Turned lobster red and felt faint with sublingual NTG  . Penicillins Hives    Hives Has patient had a PCN reaction causing immediate rash, facial/tongue/throat swelling, SOB or lightheadedness with hypotension: YES Has patient had a PCN reaction causing severe rash involving mucus membranes or skin necrosis: NO Has patient had a PCN reaction that required hospitalization NO Has patient had a PCN reaction occurring within the last 10 years: No If all of the above answers are "NO", then may proceed with Cephalosporin use.  . Biaxin [Clarithromycin] Rash    Rash   . Latex Rash  . Lidocaine Swelling    Eye swelling Tolerates Tetracaine with epidural steroid injections (04/10/17)  . Nickel Rash  . Oxycodone Hives  . Tape Rash  . Tetracyclines & Related Rash       . Tizanidine Rash    Chief Complaint  Patient presents with  . Acute Visit    Right arm coldness    HPI:  Staff reports that she has been having increased anxiety over the past weekend. She is unable to move her right side extremities. She is denying pain at this time; but did have pain over the weekend. There are no reports of changes in appetite. She is having increased difficulty finding her words.   Past Medical History:  Diagnosis Date  . A-fib ([place])   . Arthritis   . Breast cancer ([place]) 2002   RT LUMPECTOMY  . Bronchitis   . Carotid artery narrowings   . 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 Carotid artery occlusion    RIGHT 50% BLOCKAGE  . Colon polyps   . Complication of anesthesia    shaking/ FOLLOWING LOCAL AT DENTIST , drop O2 sats TO 50% DURING COLONOSCOPY  . Coronary artery disease   . Diverticulosis   . Dysrhythmia   . Environmental allergies   . GERD  (gastroesophageal reflux disease)   . History of hiatal hernia   . History of radiation therapy 07/23/18- 08/10/18   Left parietal brain tumor. 40.05 Gy in 15 fractions.   . Hyperlipidemia   . Hypertension   . IBS (irritable bowel syndrome)   . Mitral valve disorder   . Shortness of breath dyspnea   . Syncope and collapse   . UTI (lower urinary tract infection)     Past Surgical History:  Procedure Laterality Date  . APPENDECTOMY    . BREAST BIOPSY Right 2002   POS  . BREAST CYST ASPIRATION Right   . BREAST EXCISIONAL BIOPSY Left 1997   NEG  . BREAST LUMPECTOMY    . CATARACT EXTRACTION W/PHACO Right 06/07/2016   Procedure: CATARACT EXTRACTION PHACO AND INTRAOCULAR LENS PLACEMENT (IOC);  Surgeon: Birder Robson, MD;  Location: [facility] ORS;  Service: Ophthalmology;  Laterality: Right;  [place] 00:51 AP% 19.6 CDE 10.09 Fluid pack lot # [id number]  . CATARACT EXTRACTION W/PHACO Left 07/05/2016   Procedure: CATARACT EXTRACTION PHACO AND INTRAOCULAR LENS PLACEMENT (IOC);  Surgeon: Birder Robson, MD;  Location: [facility] ORS;  Service: Ophthalmology;  Laterality: Left;  [place] 00:38 AP% 24.9 CDE 9.60 Fluid Pack lot # [id number]  . CESAREAN SECTION    . TONSILLECTOMY    . TOTAL KNEE ARTHROPLASTY Left  Social History   Socioeconomic History  . Marital status: Married    Spouse name: Not on file  . Number of children: Not on file  . Years of education: Not on file  . Highest education level: Not on file  Occupational History  . Occupation: retired    Comment: Nurse, adult  . Financial resource strain: Not on file  . Food insecurity:    Worry: Not on file    Inability: Not on file  . Transportation needs:    Medical: Yes    Non-medical: Yes  Tobacco Use  . Smoking status: Former Smoker    Years: 10.00    Types: Cigarettes    Last attempt to quit: 10/03/1969    Years since quitting: 49.0  . Smokeless tobacco: Never Used  Substance and Sexual Activity  . Alcohol use: Not  Currently    Alcohol/week: 0.0 standard drinks  . Drug use: No  . Sexual activity: Not on file  Lifestyle  . Physical activity:    Days per week: Not on file    Minutes per session: Not on file  . Stress: Not on file  Relationships  . Social connections:    Talks on phone: Not on file    Gets together: Not on file    Attends religious service: Not on file    Active member of club or organization: Not on file    Attends meetings of clubs or organizations: Not on file    Relationship status: Not on file  . Intimate partner violence:    Fear of current or ex partner: No    Emotionally abused: No    Physically abused: No    Forced sexual activity: No  Other Topics Concern  . Not on file  Social History Narrative  . Not on file   Family History  Problem Relation Age of Onset  . Lung cancer Father   . Prostate cancer Father   . Hyperlipidemia Brother   . Hypertension Brother   . Obesity Brother   . Heart disease Mother   . Lung cancer Sister   . Kidney disease Other        Maternal grandparent  . Breast cancer Neg Hx       VITAL SIGNS Ht 5\' 4"  (1.626 m)   Wt 172 lb 9.6 oz (78.3 kg)   BMI 29.63 kg/m   Outpatient Encounter Medications as of 10/08/2018  Medication Sig  . acetaminophen (TYLENOL) 325 MG tablet Take 975 mg by mouth every 8 (eight) hours as needed.   Marland Kitchen apixaban (ELIQUIS) 5 MG TABS tablet Take 5 mg by mouth 2 (two) times daily.   Marland Kitchen atorvastatin (LIPITOR) 40 MG tablet Take 40 mg by mouth daily.  . clonazePAM (KLONOPIN) 0.25 MG disintegrating tablet Take 1 tablet (0.25 mg total) by mouth 2 (two) times daily.  Marland Kitchen dexamethasone (DECADRON) 2 MG tablet Take 2 mg by mouth 2 (two) times daily with a meal.  . docusate sodium (COLACE) 100 MG capsule Take 100 mg by mouth 2 (two) times daily.  . famotidine (PEPCID) 20 MG tablet Take 20 mg by mouth 2 (two) times daily as needed.  . fenofibrate (TRICOR) 145 MG tablet Take 145 mg by mouth daily.  Marland Kitchen guaiFENesin (ROBITUSSIN)  100 MG/5ML liquid Give 10 ml by mouth every 6 hours as needed for cough.  Notify provider if cough worsens / continues longer than 3 days  . ipratropium-albuterol (DUONEB) 0.5-2.5 (3) MG/3ML SOLN  Take 3 mLs by nebulization every 6 (six) hours as needed.   . lacosamide (VIMPAT) 200 MG TABS tablet Take 1 tablet (200 mg total) by mouth 2 (two) times daily.  Marland Kitchen levETIRAcetam (KEPPRA) 750 MG tablet Take 1,500 mg by mouth 2 (two) times daily.  Marland Kitchen LORazepam (ATIVAN) 2 MG/ML injection Give 1 mg IM every 30 minutes as needed for seizures that last for more than 60 seconds.  . magnesium oxide (MAG-OX) 400 MG tablet Take 400 mg by mouth 2 (two) times daily.  . metoprolol tartrate (LOPRESSOR) 25 MG tablet Take 0.5 tablets (12.5 mg total) by mouth 2 (two) times daily.  . mirtazapine (REMERON) 15 MG tablet Take 7.5 mg by mouth at bedtime. 1/2 tab  . Multiple Vitamin (MULTIVITAMIN WITH MINERALS) TABS tablet Take 1 tablet by mouth daily.  . naphazoline-pheniramine (NAPHCON-A) 0.025-0.3 % ophthalmic solution Place 1 drop into both eyes 4 (four) times daily as needed for eye irritation.  . NON FORMULARY Diet Type: Regular  . nystatin (MYCOSTATIN) 100000 UNIT/ML suspension Take 10 mLs by mouth 4 (four) times daily as needed. Swish for 30 seconds and spit for oral thrush   . ondansetron (ZOFRAN) 8 MG tablet Take 8 mg by mouth every 8 (eight) hours as needed for nausea.  . OXYGEN Inhale 2 L/min into the lungs as needed. To keep sats above 91%. Check O2 saturations prior to placing on patient and at least every 4 hours after applying.  Notify MD if oxygen saturations drop below baseline while receiving oxygen or no improvement in dyspnea  . polyethylene glycol (MIRALAX / GLYCOLAX) packet Take 17 g by mouth daily.  . potassium chloride SA (K-DUR,KLOR-CON) 20 MEQ tablet Take 40 mEq by mouth daily. 2 caps  . sertraline (ZOLOFT) 100 MG tablet Take 100 mg by mouth daily.  . sodium phosphate (FLEET) 7-19 GM/118ML ENEM Insert 1  enema rectally for constipation not relieved by MOM or Bisacodyl suppository  . torsemide (DEMADEX) 20 MG tablet Take 20 mg by mouth daily.   . traMADol (ULTRAM) 50 MG tablet Take 0.5 tablet (25 mg total) every 8 hours as needed for pain , if not relieved by Tylenol  . WOUND DRESSINGS EX Triad wound dressing paste - Apply thin film topically two times daily to macerated buttocks   No facility-administered encounter medications on file as of 10/08/2018.      SIGNIFICANT DIAGNOSTIC EXAMS  PREVIOUS:   08-07-18: chest x-ray: mild plate like atelectasis versus pulmonary infiltrate left  lung base. Chronic lung changes and no significant change in calcified granuloma in left lung base.   09-06-18: MRI of brain:  Enlargement of dominant enhancing left parietal mass and surrounding satellite nodules with increased edema and possible nonenhancing tumor. Findings may reflect progressive tumor or acute post radiation changes.  09-27-18: KUB; nonspecific bowel gas pattern with no definite acute intrabdominal  Pathology  09-30-18: chest x-ray: no active disease seen in chest   NO NEW EXAMS.    LABS REVIEWED: PREVIOUS:   06-06-18: wbc 7.0; hgb 13.6; hc6 39.5; mcv 96; plt 146 glucose 131; bun 13; creat 0.6; k+ 4.1; na++ 140; ca 9.5  08-07-18: wbc 4.5; hgb 12.7; hct 37.2; mcv 96.9; plt 142; glucose 112; bun 21; creat 0.69; k+ 3.9; na++ 139; ca 9.4 liver normal albumin 3.4  08-28-18: glucose 100; bun 21 creat 0.65; k+ 3.4; na++ 138 ca 9.5   TODAY;   09-11-18: wbc 5.7; hgb 12.1; hct  37.9; mcv 101.3; plt 177 glucose  120; bun 14; creat 0.76; k+ 3.2; na++ 143; ca 9.5 liver normal albumin 3.3  10-04-18: glucose 109; bun 13; creat 0.62; k+ 3.2; nq++ 141; ca 9.4    Review of Systems  Constitutional: Negative for malaise/fatigue.  Respiratory: Negative for cough and shortness of breath.   Cardiovascular: Negative for chest pain, palpitations and leg swelling.  Gastrointestinal: Negative for abdominal pain,  constipation and heartburn.  Musculoskeletal: Negative for back pain, joint pain and myalgias.  Skin: Negative.   Neurological: Positive for weakness. Negative for dizziness.  Psychiatric/Behavioral: The patient is not nervous/anxious.     Physical Exam Constitutional:      General: She is not in acute distress.    Appearance: Normal appearance. She is well-developed. She is not diaphoretic.  Neck:     Musculoskeletal: Neck supple.     Thyroid: No thyromegaly.  Cardiovascular:     Rate and Rhythm: Normal rate and regular rhythm.     Pulses: Normal pulses.     Heart sounds: Normal heart sounds.  Pulmonary:     Effort: Pulmonary effort is normal. No respiratory distress.     Breath sounds: Normal breath sounds.  Abdominal:     General: Bowel sounds are normal. There is no distension.     Palpations: Abdomen is soft.     Tenderness: There is no abdominal tenderness.  Musculoskeletal:     Right lower leg: No edema.     Left lower leg: No edema.     Comments: Is not able to move right side extremities   Lymphadenopathy:     Cervical: No cervical adenopathy.  Skin:    General: Skin is warm and dry.  Neurological:     Mental Status: She is alert. Mental status is at baseline.  Psychiatric:     Comments: Is sad     ASSESSMENT/ PLAN:  TODAY   1. Epilepsia partialis continua:  2. Glioblastoma of parietal lobe (left)  3. Reactive depression  Will increase her zoloft to 200 mg daily and will monitor her status.   MD is aware of resident's narcotic use and is in agreement with current plan of care. We will attempt to wean resident as apropriate   Ok Edwards NP [facility]  Contact [phone] Monday through Friday 8am- 5pm  After hours call [phone]

## 2018-10-09 ENCOUNTER — Other Ambulatory Visit: Payer: Self-pay | Admitting: Adult Health

## 2018-10-09 ENCOUNTER — Encounter: Payer: Self-pay | Admitting: Adult Health

## 2018-10-09 MED ORDER — CLONAZEPAM 0.25 MG PO TBDP: 0.2500 mg | ORAL_TABLET | Freq: Two times a day (BID) | ORAL | 0 refills | Status: DC

## 2018-10-09 NOTE — Progress Notes (Signed)
[facility]:   [facility] Room Number: [place] of Service:  SNF (31)   CODE STATUS: Full Code  Allergies  Allergen Reactions  . Nitroglycerin Other (See Comments)    Can only use the patch  Turned lobster red and felt faint with sublingual NTG  . Penicillins Hives    Hives Has patient had a PCN reaction causing immediate rash, facial/tongue/throat swelling, SOB or lightheadedness with hypotension: YES Has patient had a PCN reaction causing severe rash involving mucus membranes or skin necrosis: NO Has patient had a PCN reaction that required hospitalization NO Has patient had a PCN reaction occurring within the last 10 years: No If all of the above answers are "NO", then may proceed with Cephalosporin use.  . Biaxin [Clarithromycin] Rash    Rash   . Latex Rash  . Lidocaine Swelling    Eye swelling Tolerates Tetracaine with epidural steroid injections (04/10/17)  . Nickel Rash  . Oxycodone Hives  . Tape Rash  . Tetracyclines & Related Rash       . Tizanidine Rash    Chief Complaint  Patient presents with  . Acute Visit    Pain Management    HPI:  Staff reports that she is having rib and back pain which requires her to use they hoyer lift.   Past Medical History:  Diagnosis Date  . A-fib ([place])   . Arthritis   . Breast cancer ([place]) 2002   RT LUMPECTOMY  . Bronchitis   . Carotid artery narrowings   . 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 Carotid artery occlusion    RIGHT 50% BLOCKAGE  . Colon polyps   . Complication of anesthesia    shaking/ FOLLOWING LOCAL AT DENTIST , drop O2 sats TO 50% DURING COLONOSCOPY  . Coronary artery disease   . Diverticulosis   . Dysrhythmia   . Environmental allergies   . GERD (gastroesophageal reflux disease)   . History of hiatal hernia   . History of radiation therapy 07/23/18- 08/10/18   Left parietal brain tumor. 40.05 Gy in 15 fractions.   . Hyperlipidemia   . Hypertension   . IBS  (irritable bowel syndrome)   . Mitral valve disorder   . Shortness of breath dyspnea   . Syncope and collapse   . UTI (lower urinary tract infection)     Past Surgical History:  Procedure Laterality Date  . APPENDECTOMY    . BREAST BIOPSY Right 2002   POS  . BREAST CYST ASPIRATION Right   . BREAST EXCISIONAL BIOPSY Left 1997   NEG  . BREAST LUMPECTOMY    . CATARACT EXTRACTION W/PHACO Right 06/07/2016   Procedure: CATARACT EXTRACTION PHACO AND INTRAOCULAR LENS PLACEMENT (IOC);  Surgeon: Birder Robson, MD;  Location: [facility] ORS;  Service: Ophthalmology;  Laterality: Right;  [place] 00:51 AP% 19.6 CDE 10.09 Fluid pack lot # [id number]  . CATARACT EXTRACTION W/PHACO Left 07/05/2016   Procedure: CATARACT EXTRACTION PHACO AND INTRAOCULAR LENS PLACEMENT (IOC);  Surgeon: Birder Robson, MD;  Location: [facility] ORS;  Service: Ophthalmology;  Laterality: Left;  [place] 00:38 AP% 24.9 CDE 9.60 Fluid Pack lot # [id number]  . CESAREAN SECTION    . TONSILLECTOMY    . TOTAL KNEE ARTHROPLASTY Left     Social History   Socioeconomic History  . Marital status: Married    Spouse name: Not on file  . Number of children: Not on file  . Years of education:  Not on file  . Highest education level: Not on file  Occupational History  . Occupation: retired    Comment: Nurse, adult  . Financial resource strain: Not on file  . Food insecurity:    Worry: Not on file    Inability: Not on file  . Transportation needs:    Medical: Yes    Non-medical: Yes  Tobacco Use  . Smoking status: Former Smoker    Years: 10.00    Types: Cigarettes    Last attempt to quit: 10/03/1969    Years since quitting: 49.0  . Smokeless tobacco: Never Used  Substance and Sexual Activity  . Alcohol use: Not Currently    Alcohol/week: 0.0 standard drinks  . Drug use: No  . Sexual activity: Not on file  Lifestyle  . Physical activity:    Days per week: Not on file    Minutes per session: Not on file  . Stress: Not on  file  Relationships  . Social connections:    Talks on phone: Not on file    Gets together: Not on file    Attends religious service: Not on file    Active member of club or organization: Not on file    Attends meetings of clubs or organizations: Not on file    Relationship status: Not on file  . Intimate partner violence:    Fear of current or ex partner: No    Emotionally abused: No    Physically abused: No    Forced sexual activity: No  Other Topics Concern  . Not on file  Social History Narrative  . Not on file   Family History  Problem Relation Age of Onset  . Lung cancer Father   . Prostate cancer Father   . Hyperlipidemia Brother   . Hypertension Brother   . Obesity Brother   . Heart disease Mother   . Lung cancer Sister   . Kidney disease Other        Maternal grandparent  . Breast cancer Neg Hx       VITAL SIGNS BP (!) 111/56   Pulse 80   Temp 98.4 F (36.9 C)   Resp 20   Ht 5\' 4"  (1.626 m)   Wt 178 lb (80.7 kg)   SpO2 91%   BMI 30.55 kg/m   Outpatient Encounter Medications as of 10/09/2018  Medication Sig  . acetaminophen (TYLENOL) 325 MG tablet Take 975 mg by mouth every 8 (eight) hours as needed.   Marland Kitchen apixaban (ELIQUIS) 5 MG TABS tablet Take 5 mg by mouth 2 (two) times daily.   Marland Kitchen atorvastatin (LIPITOR) 40 MG tablet Take 40 mg by mouth daily.  . clonazePAM (KLONOPIN) 0.25 MG disintegrating tablet Take 1 tablet (0.25 mg total) by mouth 2 (two) times daily.  Marland Kitchen dexamethasone (DECADRON) 2 MG tablet Take 2 mg by mouth 2 (two) times daily with a meal.  . docusate sodium (COLACE) 100 MG capsule Take 100 mg by mouth 2 (two) times daily.  . famotidine (PEPCID) 20 MG tablet Take 20 mg by mouth 2 (two) times daily as needed.  . fenofibrate (TRICOR) 145 MG tablet Take 145 mg by mouth daily.  Marland Kitchen guaiFENesin (ROBITUSSIN) 100 MG/5ML liquid Give 10 ml by mouth every 6 hours as needed for cough.  Notify provider if cough worsens / continues longer than 3 days  .  ipratropium-albuterol (DUONEB) 0.5-2.5 (3) MG/3ML SOLN Take 3 mLs by nebulization every 6 (six) hours as  needed.   . lacosamide (VIMPAT) 200 MG TABS tablet Take 1 tablet (200 mg total) by mouth 2 (two) times daily.  Marland Kitchen levETIRAcetam (KEPPRA) 750 MG tablet Take 1,500 mg by mouth 2 (two) times daily.  Marland Kitchen LORazepam (ATIVAN) 2 MG/ML injection Give 1 mg IM every 30 minutes as needed for seizures that last for more than 60 seconds.  . magnesium oxide (MAG-OX) 400 MG tablet Take 400 mg by mouth 2 (two) times daily.  . metoprolol tartrate (LOPRESSOR) 25 MG tablet Take 0.5 tablets (12.5 mg total) by mouth 2 (two) times daily.  . mirtazapine (REMERON) 15 MG tablet Take 7.5 mg by mouth at bedtime. 1/2 tab  . Multiple Vitamin (MULTIVITAMIN WITH MINERALS) TABS tablet Take 1 tablet by mouth daily.  . naphazoline-pheniramine (NAPHCON-A) 0.025-0.3 % ophthalmic solution Place 1 drop into both eyes 4 (four) times daily as needed for eye irritation.  . NON FORMULARY Diet Type: Regular  . nystatin (MYCOSTATIN) 100000 UNIT/ML suspension Take 10 mLs by mouth 4 (four) times daily as needed. Swish for 30 seconds and spit for oral thrush   . ondansetron (ZOFRAN) 8 MG tablet Take 8 mg by mouth every 8 (eight) hours as needed for nausea.  . OXYGEN Inhale 2 L/min into the lungs as needed. To keep sats above 91%. Check O2 saturations prior to placing on patient and at least every 4 hours after applying.  Notify MD if oxygen saturations drop below baseline while receiving oxygen or no improvement in dyspnea  . polyethylene glycol (MIRALAX / GLYCOLAX) packet Take 17 g by mouth daily.  . potassium chloride SA (K-DUR,KLOR-CON) 20 MEQ tablet Take 40 mEq by mouth daily. 2 caps  . sertraline (ZOLOFT) 100 MG tablet Take 100 mg by mouth daily.  . sodium phosphate (FLEET) 7-19 GM/118ML ENEM Insert 1 enema rectally for constipation not relieved by MOM or Bisacodyl suppository  . torsemide (DEMADEX) 20 MG tablet Take 20 mg by mouth daily.     . traMADol (ULTRAM) 50 MG tablet Take 0.5 tablet (25 mg total) every 8 hours as needed for pain , if not relieved by Tylenol  . WOUND DRESSINGS EX Triad wound dressing paste - Apply thin film topically two times daily to macerated buttocks   No facility-administered encounter medications on file as of 10/09/2018.      SIGNIFICANT DIAGNOSTIC EXAMS       ASSESSMENT/ PLAN:    MD is aware of resident's narcotic use and is in agreement with current plan of care. We will attempt to wean resident as apropriate   Ok Edwards NP [facility]  Contact [phone] Monday through Friday 8am- 5pm  After hours call [phone]  This encounter was created in error - please disregard.

## 2018-10-12 ENCOUNTER — Non-Acute Institutional Stay (SKILLED_NURSING_FACILITY): Payer: Medicare Other | Admitting: Adult Health

## 2018-10-12 ENCOUNTER — Encounter: Payer: Self-pay | Admitting: Adult Health

## 2018-10-12 DIAGNOSIS — G40109 Localization-related (focal) (partial) symptomatic epilepsy and epileptic syndromes with simple partial seizures, not intractable, without status epilepticus: Secondary | ICD-10-CM | POA: Diagnosis not present

## 2018-10-12 DIAGNOSIS — R600 Localized edema: Secondary | ICD-10-CM | POA: Diagnosis not present

## 2018-10-12 DIAGNOSIS — F329 Major depressive disorder, single episode, unspecified: Secondary | ICD-10-CM

## 2018-10-12 DIAGNOSIS — E7849 Other hyperlipidemia: Secondary | ICD-10-CM

## 2018-10-12 DIAGNOSIS — C713 Malignant neoplasm of parietal lobe: Secondary | ICD-10-CM | POA: Diagnosis not present

## 2018-10-12 DIAGNOSIS — K5909 Other constipation: Secondary | ICD-10-CM

## 2018-10-12 NOTE — Progress Notes (Signed)
[facility]   [facility] Room Number: [place] of Service:  SNF (31)   CODE STATUS: Full Code  Allergies  Allergen Reactions  . Nitroglycerin Other (See Comments)    Can only use the patch  Turned lobster red and felt faint with sublingual NTG  . Penicillins Hives    Hives Has patient had a PCN reaction causing immediate rash, facial/tongue/throat swelling, SOB or lightheadedness with hypotension: YES Has patient had a PCN reaction causing severe rash involving mucus membranes or skin necrosis: NO Has patient had a PCN reaction that required hospitalization NO Has patient had a PCN reaction occurring within the last 10 years: No If all of the above answers are "NO", then may proceed with Cephalosporin use.  . Biaxin [Clarithromycin] Rash    Rash   . Latex Rash  . Lidocaine Swelling    Eye swelling Tolerates Tetracaine with epidural steroid injections (04/10/17)  . Nickel Rash  . Oxycodone Hives  . Tape Rash  . Tetracyclines & Related Rash       . Tizanidine Rash    Chief Complaint  Patient presents with  . Medical Management of Chronic Issues    Chronic constipation; glioblastoma; epilepsia partialis continua     HPI:  She is a 78 year old long term resident of this facility being seen for the management of her chronic illnesses: constipation glioblastoma; epilepsia. She is unable to move her right extremities; is having increasing difficulty with speech. There are no reports of uncontrolled pain. Her spouse is continuing to consider making her a dnr.   Past Medical History:  Diagnosis Date  . A-fib ([place])   . Arthritis   . Breast cancer ([place]) 2002   RT LUMPECTOMY  . Bronchitis   . Carotid artery narrowings   . 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 Carotid artery occlusion    RIGHT 50% BLOCKAGE  . Colon polyps   . Complication of anesthesia    shaking/ FOLLOWING LOCAL AT DENTIST , drop O2 sats TO 50% DURING COLONOSCOPY  .  Coronary artery disease   . Diverticulosis   . Dysrhythmia   . Environmental allergies   . GERD (gastroesophageal reflux disease)   . History of hiatal hernia   . History of radiation therapy 07/23/18- 08/10/18   Left parietal brain tumor. 40.05 Gy in 15 fractions.   . Hyperlipidemia   . Hypertension   . IBS (irritable bowel syndrome)   . Mitral valve disorder   . Shortness of breath dyspnea   . Syncope and collapse   . UTI (lower urinary tract infection)     Past Surgical History:  Procedure Laterality Date  . APPENDECTOMY    . BREAST BIOPSY Right 2002   POS  . BREAST CYST ASPIRATION Right   . BREAST EXCISIONAL BIOPSY Left 1997   NEG  . BREAST LUMPECTOMY    . CATARACT EXTRACTION W/PHACO Right 06/07/2016   Procedure: CATARACT EXTRACTION PHACO AND INTRAOCULAR LENS PLACEMENT (IOC);  Surgeon: Birder Robson, MD;  Location: [facility] ORS;  Service: Ophthalmology;  Laterality: Right;  [place] 00:51 AP% 19.6 CDE 10.09 Fluid pack lot # [id number]  . CATARACT EXTRACTION W/PHACO Left 07/05/2016   Procedure: CATARACT EXTRACTION PHACO AND INTRAOCULAR LENS PLACEMENT (IOC);  Surgeon: Birder Robson, MD;  Location: [facility] ORS;  Service: Ophthalmology;  Laterality: Left;  [place] 00:38 AP% 24.9 CDE 9.60 Fluid Pack lot # [id number]  . CESAREAN SECTION    .  TONSILLECTOMY    . TOTAL KNEE ARTHROPLASTY Left     Social History   Socioeconomic History  . Marital status: Married    Spouse name: Not on file  . Number of children: Not on file  . Years of education: Not on file  . Highest education level: Not on file  Occupational History  . Occupation: retired    Comment: Nurse, adult  . Financial resource strain: Not on file  . Food insecurity:    Worry: Not on file    Inability: Not on file  . Transportation needs:    Medical: Yes    Non-medical: Yes  Tobacco Use  . Smoking status: Former Smoker    Years: 10.00    Types: Cigarettes    Last attempt to quit: 10/03/1969    Years since  quitting: 49.0  . Smokeless tobacco: Never Used  Substance and Sexual Activity  . Alcohol use: Not Currently    Alcohol/week: 0.0 standard drinks  . Drug use: No  . Sexual activity: Not on file  Lifestyle  . Physical activity:    Days per week: Not on file    Minutes per session: Not on file  . Stress: Not on file  Relationships  . Social connections:    Talks on phone: Not on file    Gets together: Not on file    Attends religious service: Not on file    Active member of club or organization: Not on file    Attends meetings of clubs or organizations: Not on file    Relationship status: Not on file  . Intimate partner violence:    Fear of current or ex partner: No    Emotionally abused: No    Physically abused: No    Forced sexual activity: No  Other Topics Concern  . Not on file  Social History Narrative  . Not on file   Family History  Problem Relation Age of Onset  . Lung cancer Father   . Prostate cancer Father   . Hyperlipidemia Brother   . Hypertension Brother   . Obesity Brother   . Heart disease Mother   . Lung cancer Sister   . Kidney disease Other        Maternal grandparent  . Breast cancer Neg Hx       VITAL SIGNS BP (!) 111/56   Pulse 80   Temp 98.4 F (36.9 C)   Resp 20   Ht 5\' 4"  (1.626 m)   Wt 178 lb (80.7 kg)   SpO2 91%   BMI 30.55 kg/m   Outpatient Encounter Medications as of 10/12/2018  Medication Sig  . acetaminophen (TYLENOL) 325 MG tablet Take 975 mg by mouth every 8 (eight) hours as needed.   Marland Kitchen apixaban (ELIQUIS) 5 MG TABS tablet Take 5 mg by mouth 2 (two) times daily.   Marland Kitchen atorvastatin (LIPITOR) 40 MG tablet Take 40 mg by mouth daily.  . clonazePAM (KLONOPIN) 0.25 MG disintegrating tablet Take 1 tablet (0.25 mg total) by mouth 2 (two) times daily.  Marland Kitchen dexamethasone (DECADRON) 2 MG tablet Take 2 mg by mouth 2 (two) times daily with a meal.  . docusate sodium (COLACE) 100 MG capsule Take 100 mg by mouth 2 (two) times daily.  .  famotidine (PEPCID) 20 MG tablet Take 20 mg by mouth 2 (two) times daily as needed.  . fenofibrate (TRICOR) 145 MG tablet Take 145 mg by mouth daily.  Marland Kitchen guaiFENesin (ROBITUSSIN)  100 MG/5ML liquid Give 10 ml by mouth every 6 hours as needed for cough.  Notify provider if cough worsens / continues longer than 3 days  . ipratropium-albuterol (DUONEB) 0.5-2.5 (3) MG/3ML SOLN Take 3 mLs by nebulization every 6 (six) hours as needed.   . lacosamide (VIMPAT) 200 MG TABS tablet Take 1 tablet (200 mg total) by mouth 2 (two) times daily.  Marland Kitchen levETIRAcetam (KEPPRA) 750 MG tablet Take 1,500 mg by mouth 2 (two) times daily.  Marland Kitchen LORazepam (ATIVAN) 2 MG/ML injection Give 1 mg IM every 30 minutes as needed for seizures that last for more than 60 seconds.  . magnesium oxide (MAG-OX) 400 MG tablet Take 400 mg by mouth 2 (two) times daily.  . metoprolol tartrate (LOPRESSOR) 25 MG tablet Take 0.5 tablets (12.5 mg total) by mouth 2 (two) times daily.  . mirtazapine (REMERON) 15 MG tablet Take 7.5 mg by mouth at bedtime. 1/2 tab  . Multiple Vitamin (MULTIVITAMIN WITH MINERALS) TABS tablet Take 1 tablet by mouth daily.  . naphazoline-pheniramine (NAPHCON-A) 0.025-0.3 % ophthalmic solution Place 1 drop into both eyes 4 (four) times daily as needed for eye irritation.  . NON FORMULARY Diet Type: Regular  . nystatin (MYCOSTATIN) 100000 UNIT/ML suspension Take 10 mLs by mouth 4 (four) times daily as needed. Swish for 30 seconds and spit for oral thrush   . ondansetron (ZOFRAN) 8 MG tablet Take 8 mg by mouth every 8 (eight) hours as needed for nausea.  . OXYGEN Inhale 2 L/min into the lungs as needed. To keep sats above 91%. Check O2 saturations prior to placing on patient and at least every 4 hours after applying.  Notify MD if oxygen saturations drop below baseline while receiving oxygen or no improvement in dyspnea  . polyethylene glycol (MIRALAX / GLYCOLAX) packet Take 17 g by mouth daily.  . potassium chloride SA  (K-DUR,KLOR-CON) 20 MEQ tablet Take 40 mEq by mouth 2 (two) times daily. 2 caps  . sertraline (ZOLOFT) 100 MG tablet Take 100 mg by mouth daily.  . sodium phosphate (FLEET) 7-19 GM/118ML ENEM Insert 1 enema rectally for constipation not relieved by MOM or Bisacodyl suppository  . torsemide (DEMADEX) 20 MG tablet Take 20 mg by mouth daily.   . traMADol (ULTRAM) 50 MG tablet Take 0.5 tablet (25 mg total) every 6 hours as needed for pain , if not relieved by Tylenol  . WOUND DRESSINGS EX Triad wound dressing paste - Apply thin film topically two times daily to macerated buttocks   No facility-administered encounter medications on file as of 10/12/2018.      SIGNIFICANT DIAGNOSTIC EXAMS   PREVIOUS:   08-07-18: chest x-ray: mild plate like atelectasis versus pulmonary infiltrate left  lung base. Chronic lung changes and no significant change in calcified granuloma in left lung base.   09-06-18: MRI of brain:  Enlargement of dominant enhancing left parietal mass and surrounding satellite nodules with increased edema and possible nonenhancing tumor. Findings may reflect progressive tumor or acute post radiation changes.  09-27-18: KUB; nonspecific bowel gas pattern with no definite acute intrabdominal  Pathology  09-30-18: chest x-ray: no active disease seen in chest   NO NEW EXAMS.    LABS REVIEWED: PREVIOUS:   06-06-18: wbc 7.0; hgb 13.6; hc6 39.5; mcv 96; plt 146 glucose 131; bun 13; creat 0.6; k+ 4.1; na++ 140; ca 9.5  08-07-18: wbc 4.5; hgb 12.7; hct 37.2; mcv 96.9; plt 142; glucose 112; bun 21; creat 0.69; k+ 3.9; na++  139; ca 9.4 liver normal albumin 3.4  08-28-18: glucose 100; bun 21 creat 0.65; k+ 3.4; na++ 138 ca 9.5  09-11-18: wbc 5.7; hgb 12.1; hct  37.9; mcv 101.3; plt 177 glucose 120; bun 14; creat 0.76; k+ 3.2; na++ 143; ca 9.5 liver normal albumin 3.3  10-04-18: glucose 109; bun 13; creat 0.62; k+ 3.2; nq++ 141; ca 9.4   NO NEW LABS.    Review of Systems  Unable to perform  ROS: Other (expressive aphasia )    Physical Exam Constitutional:      General: She is not in acute distress.    Appearance: Normal appearance. She is well-developed. She is not diaphoretic.  HENT:     Head:     Comments: Has alopecia  Neck:     Musculoskeletal: Neck supple.     Thyroid: No thyromegaly.  Cardiovascular:     Rate and Rhythm: Normal rate and regular rhythm.     Pulses: Normal pulses.     Heart sounds: Normal heart sounds.  Pulmonary:     Effort: Pulmonary effort is normal. No respiratory distress.     Breath sounds: Normal breath sounds.  Abdominal:     General: Bowel sounds are normal. There is no distension.     Palpations: Abdomen is soft.     Tenderness: There is no abdominal tenderness.  Musculoskeletal:     Right lower leg: No edema.     Left lower leg: No edema.     Comments: Unable to move right side  Left side is weak   Lymphadenopathy:     Cervical: No cervical adenopathy.  Skin:    General: Skin is warm and dry.  Neurological:     Mental Status: She is alert. Mental status is at baseline.  Psychiatric:        Mood and Affect: Mood normal.       ASSESSMENT/ PLAN:  TODAY:   1. Ischemic stroke of frontal lobe: is neurologically unchanged: will continue eliquis 5 mg twice daily   2. Chronic atrial fibrillation: heart rate is stable; lopressor 12.5 mg twice daily for rate control; and eliquis 5 mg twice daily   3. Essential hypertension: stable b/p 111/56: will continue lopressor 12.5 mg twice daily   4. Chronic constipation: is stable will continue miralax daily colace twice daily   5. Glioblastoma of parietal lobe: continues to decline; has completed treatment with oncology; will continue decadron 2 mg twice daily  has ultram 25 mg every 6 hours as needed for pain   6. 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 Edema of right  lower extremity: is stable will continue demadex 20 mg daily with k+ 40 meq twice daily   8. Hyperlipidemia: is stable will continue tricor 145 mg daily lipitor 40 mg daily   9.  Reactive depression (situational): is without change: will continue klonopin 0.25 mg twice daily zoloft 100 mg daily and remeron 7.5 mg nightly         MD is aware of resident's narcotic use and is in agreement with current plan of care. We will attempt to wean resident as apropriate   Ok Edwards NP [facility]  Contact [phone] Monday through Friday 8am- 5pm  After hours call [phone]

## 2018-10-15 ENCOUNTER — Non-Acute Institutional Stay (SKILLED_NURSING_FACILITY): Payer: Medicare Other | Admitting: Adult Health

## 2018-10-15 ENCOUNTER — Telehealth: Payer: Self-pay | Admitting: *Deleted

## 2018-10-15 ENCOUNTER — Encounter: Payer: Self-pay | Admitting: Adult Health

## 2018-10-15 DIAGNOSIS — C713 Malignant neoplasm of parietal lobe: Secondary | ICD-10-CM

## 2018-10-15 NOTE — Telephone Encounter (Signed)
Called patients husband to check on status of patient.  He states she cannot verbalize anything currently beyond yes or no.  Unable to move right side.  Currently being supported at facility and has not further needs.  Will reach out if he should.

## 2018-10-15 NOTE — Progress Notes (Signed)
[facility]:   [facility] Room Number: [place] of Service:  SNF (31)   CODE STATUS: Full Code  Allergies  Allergen Reactions  . Nitroglycerin Other (See Comments)    Can only use the patch  Turned lobster red and felt faint with sublingual NTG  . Penicillins Hives    Hives Has patient had a PCN reaction causing immediate rash, facial/tongue/throat swelling, SOB or lightheadedness with hypotension: YES Has patient had a PCN reaction causing severe rash involving mucus membranes or skin necrosis: NO Has patient had a PCN reaction that required hospitalization NO Has patient had a PCN reaction occurring within the last 10 years: No If all of the above answers are "NO", then may proceed with Cephalosporin use.  . Biaxin [Clarithromycin] Rash    Rash   . Latex Rash  . Lidocaine Swelling    Eye swelling Tolerates Tetracaine with epidural steroid injections (04/10/17)  . Nickel Rash  . Oxycodone Hives  . Tape Rash  . Tetracyclines & Related Rash       . Tizanidine Rash    Chief Complaint  Patient presents with  . Acute Visit    Change in Status    HPI:  Her status is declining. She cannot move right extremities. She is having increased difficulty with her speech. She is is having increased difficulty swallowing. There are no reports of fevers present. I have spoken with her spouse; he is interested in a DNR and is interested in hospice care.    Past Medical History:  Diagnosis Date  . A-fib ([place])   . Arthritis   . Breast cancer ([place]) 2002   RT LUMPECTOMY  . Bronchitis   . Carotid artery narrowings   . 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 Carotid artery occlusion    RIGHT 50% BLOCKAGE  . Colon polyps   . Complication of anesthesia    shaking/ FOLLOWING LOCAL AT DENTIST , drop O2 sats TO 50% DURING COLONOSCOPY  . Coronary artery disease   . Diverticulosis   . Dysrhythmia   . Environmental allergies   . GERD (gastroesophageal  reflux disease)   . History of hiatal hernia   . History of radiation therapy 07/23/18- 08/10/18   Left parietal brain tumor. 40.05 Gy in 15 fractions.   . Hyperlipidemia   . Hypertension   . IBS (irritable bowel syndrome)   . Mitral valve disorder   . Shortness of breath dyspnea   . Syncope and collapse   . UTI (lower urinary tract infection)     Past Surgical History:  Procedure Laterality Date  . APPENDECTOMY    . BREAST BIOPSY Right 2002   POS  . BREAST CYST ASPIRATION Right   . BREAST EXCISIONAL BIOPSY Left 1997   NEG  . BREAST LUMPECTOMY    . CATARACT EXTRACTION W/PHACO Right 06/07/2016   Procedure: CATARACT EXTRACTION PHACO AND INTRAOCULAR LENS PLACEMENT (IOC);  Surgeon: Birder Robson, MD;  Location: [facility] ORS;  Service: Ophthalmology;  Laterality: Right;  [place] 00:51 AP% 19.6 CDE 10.09 Fluid pack lot # [id number]  . CATARACT EXTRACTION W/PHACO Left 07/05/2016   Procedure: CATARACT EXTRACTION PHACO AND INTRAOCULAR LENS PLACEMENT (IOC);  Surgeon: Birder Robson, MD;  Location: [facility] ORS;  Service: Ophthalmology;  Laterality: Left;  [place] 00:38 AP% 24.9 CDE 9.60 Fluid Pack lot # [id number]  . CESAREAN SECTION    . TONSILLECTOMY    . TOTAL KNEE ARTHROPLASTY Left  Social History   Socioeconomic History  . Marital status: Married    Spouse name: Not on file  . Number of children: Not on file  . Years of education: Not on file  . Highest education level: Not on file  Occupational History  . Occupation: retired    Comment: Nurse, adult  . Financial resource strain: Not on file  . Food insecurity:    Worry: Not on file    Inability: Not on file  . Transportation needs:    Medical: Yes    Non-medical: Yes  Tobacco Use  . Smoking status: Former Smoker    Years: 10.00    Types: Cigarettes    Last attempt to quit: 10/03/1969    Years since quitting: 49.0  . Smokeless tobacco: Never Used  Substance and Sexual Activity  . Alcohol use: Not Currently     Alcohol/week: 0.0 standard drinks  . Drug use: No  . Sexual activity: Not on file  Lifestyle  . Physical activity:    Days per week: Not on file    Minutes per session: Not on file  . Stress: Not on file  Relationships  . Social connections:    Talks on phone: Not on file    Gets together: Not on file    Attends religious service: Not on file    Active member of club or organization: Not on file    Attends meetings of clubs or organizations: Not on file    Relationship status: Not on file  . Intimate partner violence:    Fear of current or ex partner: No    Emotionally abused: No    Physically abused: No    Forced sexual activity: No  Other Topics Concern  . Not on file  Social History Narrative  . Not on file   Family History  Problem Relation Age of Onset  . Lung cancer Father   . Prostate cancer Father   . Hyperlipidemia Brother   . Hypertension Brother   . Obesity Brother   . Heart disease Mother   . Lung cancer Sister   . Kidney disease Other        Maternal grandparent  . Breast cancer Neg Hx       VITAL SIGNS BP (!) 111/56   Pulse 80   Temp 98.4 F (36.9 C)   Resp 20   Ht 5\' 4"  (1.626 m)   Wt 178 lb (80.7 kg)   SpO2 91%   BMI 30.55 kg/m   Outpatient Encounter Medications as of 10/15/2018  Medication Sig  . acetaminophen (TYLENOL) 325 MG tablet Take 975 mg by mouth every 8 (eight) hours as needed.   Marland Kitchen apixaban (ELIQUIS) 5 MG TABS tablet Take 5 mg by mouth 2 (two) times daily.   Marland Kitchen atorvastatin (LIPITOR) 40 MG tablet Take 40 mg by mouth daily.  . clonazePAM (KLONOPIN) 0.25 MG disintegrating tablet Take 1 tablet (0.25 mg total) by mouth 2 (two) times daily.  Marland Kitchen dexamethasone (DECADRON) 2 MG tablet Take 2 mg by mouth 2 (two) times daily with a meal.  . docusate sodium (COLACE) 100 MG capsule Take 100 mg by mouth 2 (two) times daily.  . famotidine (PEPCID) 20 MG tablet Take 20 mg by mouth 2 (two) times daily as needed.  . fenofibrate (TRICOR) 145 MG  tablet Take 145 mg by mouth daily.  Marland Kitchen guaiFENesin (ROBITUSSIN) 100 MG/5ML liquid Give 10 ml by mouth every 6 hours as needed  for cough.  Notify provider if cough worsens / continues longer than 3 days  . ipratropium-albuterol (DUONEB) 0.5-2.5 (3) MG/3ML SOLN Take 3 mLs by nebulization every 6 (six) hours as needed.   . lacosamide (VIMPAT) 200 MG TABS tablet Take 1 tablet (200 mg total) by mouth 2 (two) times daily.  Marland Kitchen levETIRAcetam (KEPPRA) 750 MG tablet Take 1,500 mg by mouth 2 (two) times daily.  Marland Kitchen LORazepam (ATIVAN) 2 MG/ML injection Give 1 mg IM every 30 minutes as needed for seizures that last for more than 60 seconds.  . magnesium oxide (MAG-OX) 400 MG tablet Take 400 mg by mouth 2 (two) times daily.  . metoprolol tartrate (LOPRESSOR) 25 MG tablet Take 0.5 tablets (12.5 mg total) by mouth 2 (two) times daily.  . mirtazapine (REMERON) 15 MG tablet Take 7.5 mg by mouth at bedtime. 1/2 tab  . Multiple Vitamin (MULTIVITAMIN WITH MINERALS) TABS tablet Take 1 tablet by mouth daily.  . naphazoline-pheniramine (NAPHCON-A) 0.025-0.3 % ophthalmic solution Place 1 drop into both eyes 4 (four) times daily as needed for eye irritation.  . NON FORMULARY Diet Type: Regular  . nystatin (MYCOSTATIN) 100000 UNIT/ML suspension Take 10 mLs by mouth 4 (four) times daily as needed. Swish for 30 seconds and spit for oral thrush   . ondansetron (ZOFRAN) 8 MG tablet Take 8 mg by mouth every 8 (eight) hours as needed for nausea.  . OXYGEN Inhale 2 L/min into the lungs as needed. To keep sats above 91%. Check O2 saturations prior to placing on patient and at least every 4 hours after applying.  Notify MD if oxygen saturations drop below baseline while receiving oxygen or no improvement in dyspnea  . polyethylene glycol (MIRALAX / GLYCOLAX) packet Take 17 g by mouth daily.  . potassium chloride SA (K-DUR,KLOR-CON) 20 MEQ tablet Take 40 mEq by mouth 2 (two) times daily. 2 caps  . sertraline (ZOLOFT) 100 MG tablet Take 100  mg by mouth daily.  . sodium phosphate (FLEET) 7-19 GM/118ML ENEM Insert 1 enema rectally for constipation not relieved by MOM or Bisacodyl suppository  . torsemide (DEMADEX) 20 MG tablet Take 20 mg by mouth daily.   . traMADol (ULTRAM) 50 MG tablet Take 0.5 tablet (25 mg total) every 6 hours as needed for pain , if not relieved by Tylenol  . WOUND DRESSINGS EX Triad wound dressing paste - Apply thin film topically two times daily to macerated buttocks   No facility-administered encounter medications on file as of 10/15/2018.      SIGNIFICANT DIAGNOSTIC EXAMS   PREVIOUS:   08-07-18: chest x-ray: mild plate like atelectasis versus pulmonary infiltrate left  lung base. Chronic lung changes and no significant change in calcified granuloma in left lung base.   09-06-18: MRI of brain:  Enlargement of dominant enhancing left parietal mass and surrounding satellite nodules with increased edema and possible nonenhancing tumor. Findings may reflect progressive tumor or acute post radiation changes.  09-27-18: KUB; nonspecific bowel gas pattern with no definite acute intrabdominal  Pathology  09-30-18: chest x-ray: no active disease seen in chest   NO NEW EXAMS.    LABS REVIEWED: PREVIOUS:   06-06-18: wbc 7.0; hgb 13.6; hc6 39.5; mcv 96; plt 146 glucose 131; bun 13; creat 0.6; k+ 4.1; na++ 140; ca 9.5  08-07-18: wbc 4.5; hgb 12.7; hct 37.2; mcv 96.9; plt 142; glucose 112; bun 21; creat 0.69; k+ 3.9; na++ 139; ca 9.4 liver normal albumin 3.4  08-28-18: glucose 100; bun 21  creat 0.65; k+ 3.4; na++ 138 ca 9.5  09-11-18: wbc 5.7; hgb 12.1; hct  37.9; mcv 101.3; plt 177 glucose 120; bun 14; creat 0.76; k+ 3.2; na++ 143; ca 9.5 liver normal albumin 3.3  10-04-18: glucose 109; bun 13; creat 0.62; k+ 3.2; nq++ 141; ca 9.4   NO NEW LABS.   Review of Systems  Unable to perform ROS: Other (expressive aphasia )     Physical Exam Constitutional:      General: She is not in acute distress.    Appearance:  Normal appearance. She is well-developed. She is not diaphoretic.  HENT:     Head:     Comments: Alopecia  Neck:     Musculoskeletal: Neck supple.     Thyroid: No thyromegaly.  Cardiovascular:     Rate and Rhythm: Normal rate and regular rhythm.     Heart sounds: Normal heart sounds.  Pulmonary:     Effort: Pulmonary effort is normal. No respiratory distress.     Breath sounds: Normal breath sounds.  Abdominal:     General: Bowel sounds are normal. There is no distension.     Palpations: Abdomen is soft.     Tenderness: There is no abdominal tenderness.  Musculoskeletal:     Right lower leg: No edema.     Left lower leg: No edema.     Comments:  Unable to move right side  Left side is weaker   Lymphadenopathy:     Cervical: No cervical adenopathy.  Skin:    General: Skin is warm and dry.  Neurological:     Mental Status: She is alert. Mental status is at baseline.  Psychiatric:        Mood and Affect: Mood normal.     ASSESSMENT/ PLAN:  TODAY;   1. glioblastoma of parietal lobe   Will initiate DNR Will setup hospice consult Will change diet to mechanical soft with chopped meats.         MD is aware of resident's narcotic use and is in agreement with current plan of care. We will attempt to wean resident as apropriate   Ok Edwards NP [facility]  Contact [phone] Monday through Friday 8am- 5pm  After hours call [phone]

## 2018-10-17 ENCOUNTER — Other Ambulatory Visit: Payer: Self-pay | Admitting: Adult Health

## 2018-10-17 MED ORDER — CLONAZEPAM 0.25 MG PO TBDP: 0.2500 mg | ORAL_TABLET | Freq: Three times a day (TID) | ORAL | 0 refills | Status: DC

## 2018-10-18 ENCOUNTER — Other Ambulatory Visit: Payer: Self-pay | Admitting: Adult Health

## 2018-10-18 MED ORDER — LACOSAMIDE 200 MG PO TABS: 200.0000 mg | ORAL_TABLET | Freq: Two times a day (BID) | ORAL | 0 refills | Status: DC

## 2018-10-23 ENCOUNTER — Non-Acute Institutional Stay (SKILLED_NURSING_FACILITY): Payer: Medicare Other | Admitting: Adult Health

## 2018-10-23 ENCOUNTER — Encounter: Payer: Self-pay | Admitting: Adult Health

## 2018-10-23 DIAGNOSIS — C713 Malignant neoplasm of parietal lobe: Secondary | ICD-10-CM

## 2018-10-23 DIAGNOSIS — I480 Paroxysmal atrial fibrillation: Secondary | ICD-10-CM

## 2018-10-23 DIAGNOSIS — I639 Cerebral infarction, unspecified: Secondary | ICD-10-CM | POA: Diagnosis not present

## 2018-10-23 DIAGNOSIS — E7849 Other hyperlipidemia: Secondary | ICD-10-CM | POA: Diagnosis not present

## 2018-10-23 NOTE — Progress Notes (Signed)
Location:   [facility] Room Number: [place] of Service:  SNF (31)   CODE STATUS: DNR  Allergies  Allergen Reactions  . Nitroglycerin Other (See Comments)    Can only use the patch  Turned lobster red and felt faint with sublingual NTG  . Penicillins Hives    Hives Has patient had a PCN reaction causing immediate rash, facial/tongue/throat swelling, SOB or lightheadedness with hypotension: YES Has patient had a PCN reaction causing severe rash involving mucus membranes or skin necrosis: NO Has patient had a PCN reaction that required hospitalization NO Has patient had a PCN reaction occurring within the last 10 years: No If all of the above answers are "NO", then may proceed with Cephalosporin use.  . Biaxin [Clarithromycin] Rash    Rash   . Latex Rash  . Lidocaine Swelling    Eye swelling Tolerates Tetracaine with epidural steroid injections (04/10/17)  . Nickel Rash  . Oxycodone Hives  . Tape Rash  . Tetracyclines & Related Rash       . Tizanidine Rash    Chief Complaint  Patient presents with  . Acute Visit    Medication Management    HPI:  Hospice has asked me to review her medications and to remove any unnecessary medications. She is taking medications which will not improve the quality of her life. She is having periods of restlessness and agitation. Her AM dose of klonopin is being held due to her sleeping in the AM. She is not speaking; is unable to move the right side of her body and has little movement of her left. She does continue eat and drink; no signs of aspiration.    Past Medical History:  Diagnosis Date  . A-fib ([place])   . Arthritis   . Breast cancer ([place]) 2002   RT LUMPECTOMY  . Bronchitis   . Carotid artery narrowings   . 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 Carotid artery occlusion    RIGHT 50% BLOCKAGE  . Colon polyps   . Complication of anesthesia    shaking/ FOLLOWING LOCAL AT DENTIST , drop O2 sats  TO 50% DURING COLONOSCOPY  . Coronary artery disease   . Diverticulosis   . Dysrhythmia   . Environmental allergies   . GERD (gastroesophageal reflux disease)   . History of hiatal hernia   . History of radiation therapy 07/23/18- 08/10/18   Left parietal brain tumor. 40.05 Gy in 15 fractions.   . Hyperlipidemia   . Hypertension   . IBS (irritable bowel syndrome)   . Mitral valve disorder   . Shortness of breath dyspnea   . Syncope and collapse   . UTI (lower urinary tract infection)     Past Surgical History:  Procedure Laterality Date  . APPENDECTOMY    . BREAST BIOPSY Right 2002   POS  . BREAST CYST ASPIRATION Right   . BREAST EXCISIONAL BIOPSY Left 1997   NEG  . BREAST LUMPECTOMY    . CATARACT EXTRACTION W/PHACO Right 06/07/2016   Procedure: CATARACT EXTRACTION PHACO AND INTRAOCULAR LENS PLACEMENT (IOC);  Surgeon: Birder Robson, MD;  Location: [facility] ORS;  Service: Ophthalmology;  Laterality: Right;  [place] 00:51 AP% 19.6 CDE 10.09 Fluid pack lot # [id number]  . CATARACT EXTRACTION W/PHACO Left 07/05/2016   Procedure: CATARACT EXTRACTION PHACO AND INTRAOCULAR LENS PLACEMENT (IOC);  Surgeon: Birder Robson, MD;  Location: [facility] ORS;  Service: Ophthalmology;  Laterality: Left;  [place]  00:38 AP% 24.9 CDE 9.60 Fluid Pack lot # [id number]  . CESAREAN SECTION    . TONSILLECTOMY    . TOTAL KNEE ARTHROPLASTY Left     Social History   Socioeconomic History  . Marital status: Married    Spouse name: Not on file  . Number of children: Not on file  . Years of education: Not on file  . Highest education level: Not on file  Occupational History  . Occupation: retired    Comment: Nurse, adult  . Financial resource strain: Not on file  . Food insecurity:    Worry: Not on file    Inability: Not on file  . Transportation needs:    Medical: Yes    Non-medical: Yes  Tobacco Use  . Smoking status: Former Smoker    Years: 10.00    Types: Cigarettes    Last attempt to  quit: 10/03/1969    Years since quitting: 49.0  . Smokeless tobacco: Never Used  Substance and Sexual Activity  . Alcohol use: Not Currently    Alcohol/week: 0.0 standard drinks  . Drug use: No  . Sexual activity: Not on file  Lifestyle  . Physical activity:    Days per week: Not on file    Minutes per session: Not on file  . Stress: Not on file  Relationships  . Social connections:    Talks on phone: Not on file    Gets together: Not on file    Attends religious service: Not on file    Active member of club or organization: Not on file    Attends meetings of clubs or organizations: Not on file    Relationship status: Not on file  . Intimate partner violence:    Fear of current or ex partner: No    Emotionally abused: No    Physically abused: No    Forced sexual activity: No  Other Topics Concern  . Not on file  Social History Narrative  . Not on file   Family History  Problem Relation Age of Onset  . Lung cancer Father   . Prostate cancer Father   . Hyperlipidemia Brother   . Hypertension Brother   . Obesity Brother   . Heart disease Mother   . Lung cancer Sister   . Kidney disease Other        Maternal grandparent  . Breast cancer Neg Hx       VITAL SIGNS Ht 5\' 4"  (1.626 m)   Wt 174 lb 11.2 oz (79.2 kg)   BMI 29.99 kg/m   Outpatient Encounter Medications as of 10/23/2018  Medication Sig  . acetaminophen (TYLENOL) 325 MG tablet Take 975 mg by mouth every 8 (eight) hours as needed.   Marland Kitchen apixaban (ELIQUIS) 5 MG TABS tablet Take 5 mg by mouth 2 (two) times daily.   Marland Kitchen atorvastatin (LIPITOR) 40 MG tablet Take 40 mg by mouth daily.  . clonazePAM (KLONOPIN) 0.25 MG disintegrating tablet Take 1 tablet (0.25 mg total) by mouth every 8 (eight) hours. For anxiety may hold for sedation  . dexamethasone (DECADRON) 2 MG tablet Take 2 mg by mouth 2 (two) times daily with a meal.  . docusate sodium (COLACE) 100 MG capsule Take 100 mg by mouth 2 (two) times daily.  .  famotidine (PEPCID) 20 MG tablet Take 20 mg by mouth 2 (two) times daily as needed.  . fenofibrate (TRICOR) 145 MG tablet Take 145 mg by mouth daily.  Marland Kitchen  guaiFENesin (ROBITUSSIN) 100 MG/5ML liquid Give 10 ml by mouth every 6 hours as needed for cough.  Notify provider if cough worsens / continues longer than 3 days  . ipratropium-albuterol (DUONEB) 0.5-2.5 (3) MG/3ML SOLN Take 3 mLs by nebulization every 6 (six) hours as needed.   . lacosamide (VIMPAT) 200 MG TABS tablet Take 1 tablet (200 mg total) by mouth 2 (two) times daily.  Marland Kitchen levETIRAcetam (KEPPRA) 750 MG tablet Take 1,500 mg by mouth 2 (two) times daily.  Marland Kitchen LORazepam (ATIVAN) 2 MG/ML injection Give 1 mg IM every 30 minutes as needed for seizures that last for more than 60 seconds.  . magnesium oxide (MAG-OX) 400 MG tablet Take 400 mg by mouth 2 (two) times daily.  . metoprolol tartrate (LOPRESSOR) 25 MG tablet Take 0.5 tablets (12.5 mg total) by mouth 2 (two) times daily.  . mirtazapine (REMERON) 15 MG tablet Take 7.5 mg by mouth at bedtime. 1/2 tab  . Multiple Vitamin (MULTIVITAMIN WITH MINERALS) TABS tablet Take 1 tablet by mouth daily.  . naphazoline-pheniramine (NAPHCON-A) 0.025-0.3 % ophthalmic solution Place 1 drop into both eyes 4 (four) times daily as needed for eye irritation.  . NON FORMULARY Diet Type: Mechanical soft with chopped meats  . nystatin (MYCOSTATIN) 100000 UNIT/ML suspension Take 10 mLs by mouth 4 (four) times daily as needed. Swish for 30 seconds and spit for oral thrush   . ondansetron (ZOFRAN) 8 MG tablet Take 8 mg by mouth every 8 (eight) hours as needed for nausea.  . OXYGEN Inhale 2 L/min into the lungs as needed. To keep sats above 91%. Check O2 saturations prior to placing on patient and at least every 4 hours after applying.  Notify MD if oxygen saturations drop below baseline while receiving oxygen or no improvement in dyspnea  . polyethylene glycol (MIRALAX / GLYCOLAX) packet Take 17 g by mouth daily.  .  potassium chloride SA (K-DUR,KLOR-CON) 20 MEQ tablet Take 40 mEq by mouth 2 (two) times daily. 2 caps  . sertraline (ZOLOFT) 100 MG tablet Take 100 mg by mouth daily.  . sodium phosphate (FLEET) 7-19 GM/118ML ENEM Insert 1 enema rectally for constipation not relieved by MOM or Bisacodyl suppository  . torsemide (DEMADEX) 20 MG tablet Take 20 mg by mouth daily.   . traMADol (ULTRAM) 50 MG tablet Take 0.5 tablet (25 mg total) every 6 hours as needed for pain , if not relieved by Tylenol  . WOUND DRESSINGS EX Triad wound dressing paste - Apply thin film topically two times daily to macerated buttocks   No facility-administered encounter medications on file as of 10/23/2018.      SIGNIFICANT DIAGNOSTIC EXAMS  PREVIOUS:   08-07-18: chest x-ray: mild plate like atelectasis versus pulmonary infiltrate left  lung base. Chronic lung changes and no significant change in calcified granuloma in left lung base.   09-06-18: MRI of brain:  Enlargement of dominant enhancing left parietal mass and surrounding satellite nodules with increased edema and possible nonenhancing tumor. Findings may reflect progressive tumor or acute post radiation changes.  09-27-18: KUB; nonspecific bowel gas pattern with no definite acute intrabdominal  Pathology  09-30-18: chest x-ray: no active disease seen in chest   NO NEW EXAMS.    LABS REVIEWED: PREVIOUS:   06-06-18: wbc 7.0; hgb 13.6; hc6 39.5; mcv 96; plt 146 glucose 131; bun 13; creat 0.6; k+ 4.1; na++ 140; ca 9.5  08-07-18: wbc 4.5; hgb 12.7; hct 37.2; mcv 96.9; plt 142; glucose 112; bun 21;  creat 0.69; k+ 3.9; na++ 139; ca 9.4 liver normal albumin 3.4  08-28-18: glucose 100; bun 21 creat 0.65; k+ 3.4; na++ 138 ca 9.5  09-11-18: wbc 5.7; hgb 12.1; hct  37.9; mcv 101.3; plt 177 glucose 120; bun 14; creat 0.76; k+ 3.2; na++ 143; ca 9.5 liver normal albumin 3.3  10-04-18: glucose 109; bun 13; creat 0.62; k+ 3.2; nq++ 141; ca 9.4   NO NEW LABS.   Review of Systems    Reason unable to perform ROS: expressive aphasia     Physical Exam Constitutional:      General: She is not in acute distress.    Appearance: She is well-developed. She is not diaphoretic.  HENT:     Head:     Comments: Alopecia  Neck:     Musculoskeletal: Neck supple.     Thyroid: No thyromegaly.  Cardiovascular:     Rate and Rhythm: Normal rate and regular rhythm.     Pulses: Normal pulses.     Heart sounds: Normal heart sounds.  Pulmonary:     Effort: Pulmonary effort is normal. No respiratory distress.     Breath sounds: Normal breath sounds.  Abdominal:     General: Bowel sounds are normal. There is no distension.     Palpations: Abdomen is soft.     Tenderness: There is no abdominal tenderness.  Musculoskeletal:     Right lower leg: No edema.     Left lower leg: No edema.     Comments: Unable to move right side  Left side is very weak   Lymphadenopathy:     Cervical: No cervical adenopathy.  Skin:    General: Skin is warm and dry.  Neurological:     Mental Status: She is alert.  Psychiatric:        Mood and Affect: Mood normal.      ASSESSMENT/ PLAN:  TODAY;   1. PAF 2. Ischemic stroke of frontal lobe 3. Glioblastoma of parietal lobe 4. Other hyperlipidemia  Will stop the following medications: lipitor; remeron; tricor; MVI; k+ demadex; mag ox  I have asked to staff NOT to hold klonopin due to her agitation.  Will continue to monitor her status.      MD is aware of resident's narcotic use and is in agreement with current plan of care. We will attempt to wean resident as apropriate   Ok Edwards NP [facility]  Contact [phone] Monday through Friday 8am- 5pm  After hours call [phone]

## 2018-10-26 ENCOUNTER — Encounter: Payer: Self-pay | Admitting: Adult Health

## 2018-10-26 ENCOUNTER — Non-Acute Institutional Stay (SKILLED_NURSING_FACILITY): Payer: Medicare Other | Admitting: Adult Health

## 2018-10-26 ENCOUNTER — Other Ambulatory Visit: Payer: Self-pay | Admitting: Adult Health

## 2018-10-26 DIAGNOSIS — C713 Malignant neoplasm of parietal lobe: Secondary | ICD-10-CM

## 2018-10-26 DIAGNOSIS — G40109 Localization-related (focal) (partial) symptomatic epilepsy and epileptic syndromes with simple partial seizures, not intractable, without status epilepticus: Secondary | ICD-10-CM

## 2018-10-26 MED ORDER — PHENOBARBITAL 15 MG PO TABS: 15.0000 mg | ORAL_TABLET | Freq: Every day | ORAL | 0 refills | Status: DC

## 2018-10-26 NOTE — Progress Notes (Signed)
Location:   [facility] Room Number: 914[address] Place of Service:  SNF (31)   CODE STATUS: DNR  Allergies  Allergen Reactions  . Nitroglycerin Other (See Comments)    Can only use the patch  Turned lobster red and felt faint with sublingual NTG  . Penicillins Hives    Hives Has patient had a PCN reaction causing immediate rash, facial/tongue/throat swelling, SOB or lightheadedness with hypotension: YES Has patient had a PCN reaction causing severe rash involving mucus membranes or skin necrosis: NO Has patient had a PCN reaction that required hospitalization NO Has patient had a PCN reaction occurring within the last 10 years: No If all of the above answers are "NO", then may proceed with Cephalosporin use.  . Biaxin [Clarithromycin] Rash    Rash   . Latex Rash  . Lidocaine Swelling    Eye swelling Tolerates Tetracaine with epidural steroid injections (04/10/17)  . Nickel Rash  . Oxycodone Hives  . Tape Rash  . Tetracyclines & Related Rash       . Tizanidine Rash    Chief Complaint  Patient presents with  . Acute Visit    Mood    HPI:  She has become more restless and agitated over the past weekend. She is no longer able to speak; has no movement on her right side and significant weakness on the left side. She presently has an intermittent left hand tremor. Her appetite is significantly diminished; there are no signs of aspiration present. She does not appear to be in pain.   Past Medical History:  Diagnosis Date  . A-fib ([place])   . Arthritis   . Breast cancer ([place]) 2002   RT LUMPECTOMY  . Bronchitis   . Carotid artery narrowings   . 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 Carotid artery occlusion    RIGHT 50% BLOCKAGE  . Colon polyps   . Complication of anesthesia    shaking/ FOLLOWING LOCAL AT DENTIST , drop O2 sats TO 50% DURING COLONOSCOPY  . Coronary artery disease   . Diverticulosis   . Dysrhythmia   . Environmental allergies     . GERD (gastroesophageal reflux disease)   . History of hiatal hernia   . History of radiation therapy 07/23/18- 08/10/18   Left parietal brain tumor. 40.05 Gy in 15 fractions.   . Hyperlipidemia   . Hypertension   . IBS (irritable bowel syndrome)   . Mitral valve disorder   . Shortness of breath dyspnea   . Syncope and collapse   . UTI (lower urinary tract infection)     Past Surgical History:  Procedure Laterality Date  . APPENDECTOMY    . BREAST BIOPSY Right 2002   POS  . BREAST CYST ASPIRATION Right   . BREAST EXCISIONAL BIOPSY Left 1997   NEG  . BREAST LUMPECTOMY    . CATARACT EXTRACTION W/PHACO Right 06/07/2016   Procedure: CATARACT EXTRACTION PHACO AND INTRAOCULAR LENS PLACEMENT (IOC);  Surgeon: Birder Robson, MD;  Location: [facility] ORS;  Service: Ophthalmology;  Laterality: Right;  [place] 00:51 AP% 19.6 CDE 10.09 Fluid pack lot # [id number]  . CATARACT EXTRACTION W/PHACO Left 07/05/2016   Procedure: CATARACT EXTRACTION PHACO AND INTRAOCULAR LENS PLACEMENT (IOC);  Surgeon: Birder Robson, MD;  Location: [facility] ORS;  Service: Ophthalmology;  Laterality: Left;  [place] 00:38 AP% 24.9 CDE 9.60 Fluid Pack lot # [id number]  . CESAREAN SECTION    . TONSILLECTOMY    .  TOTAL KNEE ARTHROPLASTY Left     Social History   Socioeconomic History  . Marital status: Married    Spouse name: Not on file  . Number of children: Not on file  . Years of education: Not on file  . Highest education level: Not on file  Occupational History  . Occupation: retired    Comment: Nurse, adult  . Financial resource strain: Not on file  . Food insecurity:    Worry: Not on file    Inability: Not on file  . Transportation needs:    Medical: Yes    Non-medical: Yes  Tobacco Use  . Smoking status: Former Smoker    Years: 10.00    Types: Cigarettes    Last attempt to quit: 10/03/1969    Years since quitting: 49.0  . Smokeless tobacco: Never Used  Substance and Sexual Activity  . Alcohol  use: Not Currently    Alcohol/week: 0.0 standard drinks  . Drug use: No  . Sexual activity: Not on file  Lifestyle  . Physical activity:    Days per week: Not on file    Minutes per session: Not on file  . Stress: Not on file  Relationships  . Social connections:    Talks on phone: Not on file    Gets together: Not on file    Attends religious service: Not on file    Active member of club or organization: Not on file    Attends meetings of clubs or organizations: Not on file    Relationship status: Not on file  . Intimate partner violence:    Fear of current or ex partner: No    Emotionally abused: No    Physically abused: No    Forced sexual activity: No  Other Topics Concern  . Not on file  Social History Narrative  . Not on file   Family History  Problem Relation Age of Onset  . Lung cancer Father   . Prostate cancer Father   . Hyperlipidemia Brother   . Hypertension Brother   . Obesity Brother   . Heart disease Mother   . Lung cancer Sister   . Kidney disease Other        Maternal grandparent  . Breast cancer Neg Hx       VITAL SIGNS BP 119/62   Pulse 74   Temp (!) 97.5 F (36.4 C) (Oral)   Resp 18   Ht 5\' 4"  (1.626 m)   Wt 174 lb 4.8 oz (79.1 kg)   SpO2 91%   BMI 29.92 kg/m   Outpatient Encounter Medications as of 10/26/2018  Medication Sig  . acetaminophen (TYLENOL) 325 MG tablet Take 975 mg by mouth every 8 (eight) hours as needed.   Marland Kitchen aspirin 81 MG EC tablet Take 81 mg by mouth daily. Swallow whole.  . clonazePAM (KLONOPIN) 0.25 MG disintegrating tablet Take 1 tablet (0.25 mg total) by mouth every 8 (eight) hours. For anxiety may hold for sedation  . dexamethasone (DECADRON) 2 MG tablet Take 2 mg by mouth 2 (two) times daily with a meal.  . docusate sodium (COLACE) 100 MG capsule Take 100 mg by mouth 2 (two) times daily.  . famotidine (PEPCID) 20 MG tablet Take 20 mg by mouth 2 (two) times daily as needed.  Marland Kitchen guaiFENesin (ROBITUSSIN) 100 MG/5ML  liquid Give 10 ml by mouth every 6 hours as needed for cough.  Notify provider if cough worsens / continues longer than  3 days  . ipratropium-albuterol (DUONEB) 0.5-2.5 (3) MG/3ML SOLN Take 3 mLs by nebulization every 6 (six) hours as needed.   . lacosamide (VIMPAT) 200 MG TABS tablet Take 1 tablet (200 mg total) by mouth 2 (two) times daily.  Marland Kitchen levETIRAcetam (KEPPRA) 750 MG tablet Take 1,500 mg by mouth 2 (two) times daily.  Marland Kitchen LORazepam (ATIVAN) 2 MG/ML injection Give 1 mg IM every 30 minutes as needed for seizures that last for more than 60 seconds.  . metoprolol tartrate (LOPRESSOR) 25 MG tablet Take 0.5 tablets (12.5 mg total) by mouth 2 (two) times daily.  . Multiple Vitamin (MULTIVITAMIN WITH MINERALS) TABS tablet Take 1 tablet by mouth daily.  . naphazoline-pheniramine (NAPHCON-A) 0.025-0.3 % ophthalmic solution Place 1 drop into both eyes 4 (four) times daily as needed for eye irritation.  . NON FORMULARY Diet Type: Mechanical soft with chopped meats  . nystatin (MYCOSTATIN) 100000 UNIT/ML suspension Take 10 mLs by mouth 4 (four) times daily as needed. Swish for 30 seconds and spit for oral thrush   . ondansetron (ZOFRAN) 8 MG tablet Take 8 mg by mouth every 8 (eight) hours as needed for nausea.  . OXYGEN Inhale 2 L/min into the lungs as needed. To keep sats above 91%. Check O2 saturations prior to placing on patient and at least every 4 hours after applying.  Notify MD if oxygen saturations drop below baseline while receiving oxygen or no improvement in dyspnea  . PHENObarbital (LUMINAL) 15 MG tablet Take 1 tablet (15 mg total) by mouth at bedtime.  . polyethylene glycol (MIRALAX / GLYCOLAX) packet Take 17 g by mouth daily.  . sertraline (ZOLOFT) 100 MG tablet Take 200 mg by mouth daily.   . sodium phosphate (FLEET) 7-19 GM/118ML ENEM Insert 1 enema rectally for constipation not relieved by MOM or Bisacodyl suppository  . traMADol (ULTRAM) 50 MG tablet Take 0.5 tablet (25 mg total) every 6  hours as needed for pain , if not relieved by Tylenol  . WOUND DRESSINGS EX Triad wound dressing paste - Apply thin film topically two times daily to macerated buttocks  . [DISCONTINUED] apixaban (ELIQUIS) 5 MG TABS tablet Take 5 mg by mouth 2 (two) times daily.   . [DISCONTINUED] atorvastatin (LIPITOR) 40 MG tablet Take 40 mg by mouth daily.  . [DISCONTINUED] fenofibrate (TRICOR) 145 MG tablet Take 145 mg by mouth daily.  . [DISCONTINUED] magnesium oxide (MAG-OX) 400 MG tablet Take 400 mg by mouth 2 (two) times daily.  . [DISCONTINUED] mirtazapine (REMERON) 15 MG tablet Take 7.5 mg by mouth at bedtime. 1/2 tab  . [DISCONTINUED] potassium chloride SA (K-DUR,KLOR-CON) 20 MEQ tablet Take 40 mEq by mouth 2 (two) times daily. 2 caps  . [DISCONTINUED] torsemide (DEMADEX) 20 MG tablet Take 20 mg by mouth daily.    No facility-administered encounter medications on file as of 10/26/2018.      SIGNIFICANT DIAGNOSTIC EXAMS   PREVIOUS:   08-07-18: chest x-ray: mild plate like atelectasis versus pulmonary infiltrate left  lung base. Chronic lung changes and no significant change in calcified granuloma in left lung base.   09-06-18: MRI of brain:  Enlargement of dominant enhancing left parietal mass and surrounding satellite nodules with increased edema and possible nonenhancing tumor. Findings may reflect progressive tumor or acute post radiation changes.  09-27-18: KUB; nonspecific bowel gas pattern with no definite acute intrabdominal  Pathology  09-30-18: chest x-ray: no active disease seen in chest   NO NEW EXAMS.    LABS  REVIEWED: PREVIOUS:   06-06-18: wbc 7.0; hgb 13.6; hc6 39.5; mcv 96; plt 146 glucose 131; bun 13; creat 0.6; k+ 4.1; na++ 140; ca 9.5  08-07-18: wbc 4.5; hgb 12.7; hct 37.2; mcv 96.9; plt 142; glucose 112; bun 21; creat 0.69; k+ 3.9; na++ 139; ca 9.4 liver normal albumin 3.4  08-28-18: glucose 100; bun 21 creat 0.65; k+ 3.4; na++ 138 ca 9.5  09-11-18: wbc 5.7; hgb 12.1; hct   37.9; mcv 101.3; plt 177 glucose 120; bun 14; creat 0.76; k+ 3.2; na++ 143; ca 9.5 liver normal albumin 3.3  10-04-18: glucose 109; bun 13; creat 0.62; k+ 3.2; nq++ 141; ca 9.4   NO NEW LABS.   Review of Systems  Reason unable to perform ROS: nonverbal.     Physical Exam Constitutional:      General: She is not in acute distress.    Appearance: She is well-developed. She is not diaphoretic.  HENT:     Head:     Comments: Alopecia  Neck:     Musculoskeletal: Neck supple.     Thyroid: No thyromegaly.  Cardiovascular:     Rate and Rhythm: Normal rate and regular rhythm.     Pulses: Normal pulses.     Heart sounds: Murmur present.     Comments: 3/6 Pulmonary:     Effort: Pulmonary effort is normal. No respiratory distress.     Breath sounds: Normal breath sounds.  Abdominal:     General: Bowel sounds are normal. There is no distension.     Palpations: Abdomen is soft.     Tenderness: There is no abdominal tenderness.  Musculoskeletal:     Right lower leg: No edema.     Left lower leg: No edema.     Comments: Is unable to move right side  Has significant weakness on the left side Has tremor left hand  Lymphadenopathy:     Cervical: No cervical adenopathy.  Skin:    General: Skin is warm and dry.  Neurological:     Mental Status: She is alert.     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    MD is aware of resident's narcotic use and is in agreement with current plan of care. We will attempt to wean resident as apropriate   Ok Edwards NP [facility]  Contact [phone] Monday through Friday 8am- 5pm  After hours call [phone]

## 2018-10-29 ENCOUNTER — Other Ambulatory Visit: Payer: Self-pay | Admitting: Adult Health

## 2018-10-29 ENCOUNTER — Non-Acute Institutional Stay (SKILLED_NURSING_FACILITY): Payer: Medicare Other | Admitting: Adult Health

## 2018-10-29 ENCOUNTER — Encounter: Payer: Self-pay | Admitting: Adult Health

## 2018-10-29 DIAGNOSIS — C713 Malignant neoplasm of parietal lobe: Secondary | ICD-10-CM | POA: Diagnosis not present

## 2018-10-29 DIAGNOSIS — G40109 Localization-related (focal) (partial) symptomatic epilepsy and epileptic syndromes with simple partial seizures, not intractable, without status epilepticus: Secondary | ICD-10-CM | POA: Diagnosis not present

## 2018-10-29 MED ORDER — LORAZEPAM 2 MG/ML PO CONC: 0.6000 mg | ORAL | 0 refills | Status: DC

## 2018-10-29 MED ORDER — MORPHINE SULFATE (CONCENTRATE) 20 MG/ML PO SOLN: 5.0000 mg | ORAL | 0 refills | Status: AC | PRN

## 2018-10-29 MED ORDER — LORAZEPAM 2 MG/ML IJ SOLN: INTRAMUSCULAR | 0 refills | Status: AC

## 2018-10-29 NOTE — Progress Notes (Signed)
[facility]   [facility] Room Number: [place] of Service:  SNF (31)   CODE STATUS: DNR  Allergies  Allergen Reactions  . Nitroglycerin Other (See Comments)    Can only use the patch  Turned lobster red and felt faint with sublingual NTG  . Penicillins Hives    Hives Has patient had a PCN reaction causing immediate rash, facial/tongue/throat swelling, SOB or lightheadedness with hypotension: YES Has patient had a PCN reaction causing severe rash involving mucus membranes or skin necrosis: NO Has patient had a PCN reaction that required hospitalization NO Has patient had a PCN reaction occurring within the last 10 years: No If all of the above answers are "NO", then may proceed with Cephalosporin use.  . Biaxin [Clarithromycin] Rash    Rash   . Latex Rash  . Lidocaine Swelling    Eye swelling Tolerates Tetracaine with epidural steroid injections (04/10/17)  . Nickel Rash  . Oxycodone Hives  . Tape Rash  . Tetracyclines & Related Rash       . Tizanidine Rash    Chief Complaint  Patient presents with  . Acute Visit    Change in Status    HPI:  She has had a significant change in status. She had seizures lasting up to 50 minutes. She is nonverbal; not aware of her surroundings. She does have a low grade temp of 99.8. she is using 02 at this time. Her husband is at bedside and she continues to be followed by hospice care.    Past Medical History:  Diagnosis Date  . A-fib ([place])   . Arthritis   . Breast cancer ([place]) 2002   RT LUMPECTOMY  . Bronchitis   . Carotid artery narrowings   . 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 Carotid artery occlusion    RIGHT 50% BLOCKAGE  . Colon polyps   . Complication of anesthesia    shaking/ FOLLOWING LOCAL AT DENTIST , drop O2 sats TO 50% DURING COLONOSCOPY  . Coronary artery disease   . Diverticulosis   . Dysrhythmia   . Environmental allergies   . GERD (gastroesophageal reflux disease)   .  History of hiatal hernia   . History of radiation therapy 07/23/18- 08/10/18   Left parietal brain tumor. 40.05 Gy in 15 fractions.   . Hyperlipidemia   . Hypertension   . IBS (irritable bowel syndrome)   . Mitral valve disorder   . Shortness of breath dyspnea   . Syncope and collapse   . UTI (lower urinary tract infection)     Past Surgical History:  Procedure Laterality Date  . APPENDECTOMY    . BREAST BIOPSY Right 2002   POS  . BREAST CYST ASPIRATION Right   . BREAST EXCISIONAL BIOPSY Left 1997   NEG  . BREAST LUMPECTOMY    . CATARACT EXTRACTION W/PHACO Right 06/07/2016   Procedure: CATARACT EXTRACTION PHACO AND INTRAOCULAR LENS PLACEMENT (IOC);  Surgeon: Birder Robson, MD;  Location: [facility] ORS;  Service: Ophthalmology;  Laterality: Right;  [place] 00:51 AP% 19.6 CDE 10.09 Fluid pack lot # [id number]  . CATARACT EXTRACTION W/PHACO Left 07/05/2016   Procedure: CATARACT EXTRACTION PHACO AND INTRAOCULAR LENS PLACEMENT (IOC);  Surgeon: Birder Robson, MD;  Location: [facility] ORS;  Service: Ophthalmology;  Laterality: Left;  [place] 00:38 AP% 24.9 CDE 9.60 Fluid Pack lot # [id number]  . CESAREAN SECTION    . TONSILLECTOMY    . TOTAL KNEE ARTHROPLASTY  Left     Social History   Socioeconomic History  . Marital status: Married    Spouse name: Not on file  . Number of children: Not on file  . Years of education: Not on file  . Highest education level: Not on file  Occupational History  . Occupation: retired    Comment: Nurse, adult  . Financial resource strain: Not on file  . Food insecurity:    Worry: Not on file    Inability: Not on file  . Transportation needs:    Medical: Yes    Non-medical: Yes  Tobacco Use  . Smoking status: Former Smoker    Years: 10.00    Types: Cigarettes    Last attempt to quit: 10/03/1969    Years since quitting: 49.1  . Smokeless tobacco: Never Used  Substance and Sexual Activity  . Alcohol use: Not Currently    Alcohol/week: 0.0  standard drinks  . Drug use: No  . Sexual activity: Not on file  Lifestyle  . Physical activity:    Days per week: Not on file    Minutes per session: Not on file  . Stress: Not on file  Relationships  . Social connections:    Talks on phone: Not on file    Gets together: Not on file    Attends religious service: Not on file    Active member of club or organization: Not on file    Attends meetings of clubs or organizations: Not on file    Relationship status: Not on file  . Intimate partner violence:    Fear of current or ex partner: No    Emotionally abused: No    Physically abused: No    Forced sexual activity: No  Other Topics Concern  . Not on file  Social History Narrative  . Not on file   Family History  Problem Relation Age of Onset  . Lung cancer Father   . Prostate cancer Father   . Hyperlipidemia Brother   . Hypertension Brother   . Obesity Brother   . Heart disease Mother   . Lung cancer Sister   . Kidney disease Other        Maternal grandparent  . Breast cancer Neg Hx       VITAL SIGNS BP 119/62   Pulse 74   Temp 99.8 F (37.7 C)   Resp 18   Ht 5\' 4"  (1.626 m)   Wt 174 lb 4.8 oz (79.1 kg)   SpO2 91%   BMI 29.92 kg/m   Outpatient Encounter Medications as of 10/29/2018  Medication Sig  . acetaminophen (TYLENOL) 325 MG tablet Take 975 mg by mouth every 8 (eight) hours as needed.   Marland Kitchen aspirin 81 MG EC tablet Take 81 mg by mouth daily. Swallow whole.  . bisacodyl (DULCOLAX) 10 MG suppository Place 10 mg rectally as needed for moderate constipation.  . clonazePAM (KLONOPIN) 0.25 MG disintegrating tablet Take 1 tablet (0.25 mg total) by mouth every 8 (eight) hours. For anxiety may hold for sedation  . dexamethasone (DECADRON) 2 MG tablet Take 2 mg by mouth 2 (two) times daily with a meal.  . docusate sodium (COLACE) 100 MG capsule Take 100 mg by mouth 2 (two) times daily.  . famotidine (PEPCID) 20 MG tablet Take 20 mg by mouth 2 (two) times daily as  needed.  Marland Kitchen guaiFENesin (ROBITUSSIN) 100 MG/5ML liquid Give 10 ml by mouth every 6 hours as needed for  cough.  Notify provider if cough worsens / continues longer than 3 days  . ipratropium-albuterol (DUONEB) 0.5-2.5 (3) MG/3ML SOLN Take 3 mLs by nebulization every 6 (six) hours as needed.   . lacosamide (VIMPAT) 200 MG TABS tablet Take 1 tablet (200 mg total) by mouth 2 (two) times daily.  Marland Kitchen levETIRAcetam (KEPPRA) 750 MG tablet Take 1,500 mg by mouth 2 (two) times daily.  Marland Kitchen LORazepam (ATIVAN) 2 MG/ML injection Give 1 mg IM every 30 minutes as needed for seizures that last for more than 60 seconds.  . metoprolol tartrate (LOPRESSOR) 25 MG tablet Take 0.5 tablets (12.5 mg total) by mouth 2 (two) times daily.  . Multiple Vitamin (MULTIVITAMIN WITH MINERALS) TABS tablet Take 1 tablet by mouth daily.  . naphazoline-pheniramine (NAPHCON-A) 0.025-0.3 % ophthalmic solution Place 1 drop into both eyes 4 (four) times daily as needed for eye irritation.  . NON FORMULARY Diet Type: Mechanical soft with chopped meats  . nystatin (MYCOSTATIN) 100000 UNIT/ML suspension Take 10 mLs by mouth 4 (four) times daily as needed. Swish for 30 seconds and spit for oral thrush   . ondansetron (ZOFRAN) 8 MG tablet Take 8 mg by mouth every 8 (eight) hours as needed for nausea.  . OXYGEN Inhale 2 L/min into the lungs as needed. To keep sats above 91%. Check O2 saturations prior to placing on patient and at least every 4 hours after applying.  Notify MD if oxygen saturations drop below baseline while receiving oxygen or no improvement in dyspnea  . PHENObarbital (LUMINAL) 15 MG tablet Take 1 tablet (15 mg total) by mouth at bedtime.  . polyethylene glycol (MIRALAX / GLYCOLAX) packet Take 17 g by mouth daily.  . sertraline (ZOLOFT) 100 MG tablet Take 200 mg by mouth daily.   . sodium phosphate (FLEET) 7-19 GM/118ML ENEM Insert 1 enema rectally for constipation not relieved by MOM or Bisacodyl suppository  . traMADol (ULTRAM) 50  MG tablet Take 0.5 tablet (25 mg total) every 6 hours as needed for pain , if not relieved by Tylenol  . WOUND DRESSINGS EX Triad wound dressing paste - Apply thin film topically two times daily to macerated buttocks   No facility-administered encounter medications on file as of 10/29/2018.      SIGNIFICANT DIAGNOSTIC EXAMS   PREVIOUS:   08-07-18: chest x-ray: mild plate like atelectasis versus pulmonary infiltrate left  lung base. Chronic lung changes and no significant change in calcified granuloma in left lung base.   09-06-18: MRI of brain:  Enlargement of dominant enhancing left parietal mass and surrounding satellite nodules with increased edema and possible nonenhancing tumor. Findings may reflect progressive tumor or acute post radiation changes.  09-27-18: KUB; nonspecific bowel gas pattern with no definite acute intrabdominal  Pathology  09-30-18: chest x-ray: no active disease seen in chest   NO NEW EXAMS.    LABS REVIEWED: PREVIOUS:   06-06-18: wbc 7.0; hgb 13.6; hc6 39.5; mcv 96; plt 146 glucose 131; bun 13; creat 0.6; k+ 4.1; na++ 140; ca 9.5  08-07-18: wbc 4.5; hgb 12.7; hct 37.2; mcv 96.9; plt 142; glucose 112; bun 21; creat 0.69; k+ 3.9; na++ 139; ca 9.4 liver normal albumin 3.4  08-28-18: glucose 100; bun 21 creat 0.65; k+ 3.4; na++ 138 ca 9.5  09-11-18: wbc 5.7; hgb 12.1; hct  37.9; mcv 101.3; plt 177 glucose 120; bun 14; creat 0.76; k+ 3.2; na++ 143; ca 9.5 liver normal albumin 3.3  10-04-18: glucose 109; bun 13; creat 0.62; k+  3.2; nq++ 141; ca 9.4   NO NEW LABS.   Review of Systems  Unable to perform ROS: Medical condition    Physical Exam Constitutional:      General: She is not in acute distress. HENT:     Head:     Comments: Has alopecia  Neck:     Musculoskeletal: Neck supple.  Cardiovascular:     Rate and Rhythm: Normal rate. Rhythm irregular.     Pulses: Normal pulses.     Heart sounds: Murmur present.     Comments: 3/6 Pulmonary:     Effort:  Pulmonary effort is normal. No respiratory distress.     Breath sounds: Normal breath sounds.     Comments: Using 02  Abdominal:     General: Bowel sounds are normal.     Palpations: Abdomen is soft.  Musculoskeletal:     Right lower leg: No edema.     Left lower leg: No edema.     Comments: Is not voluntarily moving any extremity  Skin:    General: Skin is warm and dry.  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    MD is aware of resident's narcotic use and is in agreement with current plan of care. We will attempt to wean resident as apropriate   Ok Edwards NP [facility]  Contact [phone] Monday through Friday 8am- 5pm  After hours call [phone]

## 2018-10-31 ENCOUNTER — Non-Acute Institutional Stay (SKILLED_NURSING_FACILITY): Payer: Medicare Other | Admitting: Adult Health

## 2018-10-31 ENCOUNTER — Encounter: Payer: Self-pay | Admitting: Adult Health

## 2018-10-31 DIAGNOSIS — C713 Malignant neoplasm of parietal lobe: Secondary | ICD-10-CM | POA: Diagnosis not present

## 2018-10-31 DIAGNOSIS — G40109 Localization-related (focal) (partial) symptomatic epilepsy and epileptic syndromes with simple partial seizures, not intractable, without status epilepticus: Secondary | ICD-10-CM | POA: Diagnosis not present

## 2018-10-31 NOTE — Progress Notes (Signed)
[facility]   [facility] Room Number: [place] of Service:  SNF (31)   CODE STATUS: DNR  Allergies  Allergen Reactions  . Nitroglycerin Other (See Comments)    Can only use the patch  Turned lobster red and felt faint with sublingual NTG  . Penicillins Hives    Hives Has patient had a PCN reaction causing immediate rash, facial/tongue/throat swelling, SOB or lightheadedness with hypotension: YES Has patient had a PCN reaction causing severe rash involving mucus membranes or skin necrosis: NO Has patient had a PCN reaction that required hospitalization NO Has patient had a PCN reaction occurring within the last 10 years: No If all of the above answers are "NO", then may proceed with Cephalosporin use.  . Biaxin [Clarithromycin] Rash    Rash   . Latex Rash  . Lidocaine Swelling    Eye swelling Tolerates Tetracaine with epidural steroid injections (04/10/17)  . Nickel Rash  . Oxycodone Hives  . Tape Rash  . Tetracyclines & Related Rash       . Tizanidine Rash    Chief Complaint  Patient presents with  . Acute Visit    Change in Status    HPI:  She is restless and is unable to rest. She is not responsive; no po intake. The focus of her care is comfort only; she does have family present.   Past Medical History:  Diagnosis Date  . A-fib ([place])   . Arthritis   . Breast cancer ([place]) 2002   RT LUMPECTOMY  . Bronchitis   . Carotid artery narrowings   . 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 Carotid artery occlusion    RIGHT 50% BLOCKAGE  . Colon polyps   . Complication of anesthesia    shaking/ FOLLOWING LOCAL AT DENTIST , drop O2 sats TO 50% DURING COLONOSCOPY  . Coronary artery disease   . Diverticulosis   . Dysrhythmia   . Environmental allergies   . GERD (gastroesophageal reflux disease)   . History of hiatal hernia   . History of radiation therapy 07/23/18- 08/10/18   Left parietal brain tumor. 40.05 Gy in 15 fractions.   .  Hyperlipidemia   . Hypertension   . IBS (irritable bowel syndrome)   . Mitral valve disorder   . Shortness of breath dyspnea   . Syncope and collapse   . UTI (lower urinary tract infection)     Past Surgical History:  Procedure Laterality Date  . APPENDECTOMY    . BREAST BIOPSY Right 2002   POS  . BREAST CYST ASPIRATION Right   . BREAST EXCISIONAL BIOPSY Left 1997   NEG  . BREAST LUMPECTOMY    . CATARACT EXTRACTION W/PHACO Right 06/07/2016   Procedure: CATARACT EXTRACTION PHACO AND INTRAOCULAR LENS PLACEMENT (IOC);  Surgeon: Birder Robson, MD;  Location: [facility] ORS;  Service: Ophthalmology;  Laterality: Right;  [place] 00:51 AP% 19.6 CDE 10.09 Fluid pack lot # [id number]  . CATARACT EXTRACTION W/PHACO Left 07/05/2016   Procedure: CATARACT EXTRACTION PHACO AND INTRAOCULAR LENS PLACEMENT (IOC);  Surgeon: Birder Robson, MD;  Location: [facility] ORS;  Service: Ophthalmology;  Laterality: Left;  [place] 00:38 AP% 24.9 CDE 9.60 Fluid Pack lot # [id number]  . CESAREAN SECTION    . TONSILLECTOMY    . TOTAL KNEE ARTHROPLASTY Left     Social History   Socioeconomic History  . Marital status: Married    Spouse name: Not on file  . Number  of children: Not on file  . Years of education: Not on file  . Highest education level: Not on file  Occupational History  . Occupation: retired    Comment: Nurse, adult  . Financial resource strain: Not on file  . Food insecurity:    Worry: Not on file    Inability: Not on file  . Transportation needs:    Medical: Yes    Non-medical: Yes  Tobacco Use  . Smoking status: Former Smoker    Years: 10.00    Types: Cigarettes    Last attempt to quit: 10/03/1969    Years since quitting: 49.1  . Smokeless tobacco: Never Used  Substance and Sexual Activity  . Alcohol use: Not Currently    Alcohol/week: 0.0 standard drinks  . Drug use: No  . Sexual activity: Not on file  Lifestyle  . Physical activity:    Days per week: Not on file    Minutes  per session: Not on file  . Stress: Not on file  Relationships  . Social connections:    Talks on phone: Not on file    Gets together: Not on file    Attends religious service: Not on file    Active member of club or organization: Not on file    Attends meetings of clubs or organizations: Not on file    Relationship status: Not on file  . Intimate partner violence:    Fear of current or ex partner: No    Emotionally abused: No    Physically abused: No    Forced sexual activity: No  Other Topics Concern  . Not on file  Social History Narrative  . Not on file   Family History  Problem Relation Age of Onset  . Lung cancer Father   . Prostate cancer Father   . Hyperlipidemia Brother   . Hypertension Brother   . Obesity Brother   . Heart disease Mother   . Lung cancer Sister   . Kidney disease Other        Maternal grandparent  . Breast cancer Neg Hx       VITAL SIGNS BP (!) 120/95   Pulse 94   Temp 99.3 F (37.4 C)   Resp 19   Ht 5\' 4"  (1.626 m)   Wt 174 lb 4.8 oz (79.1 kg)   SpO2 (!) 88%   BMI 29.92 kg/m   Outpatient Encounter Medications as of 10/31/2018  Medication Sig  . acetaminophen (TYLENOL) 650 MG suppository Place 650 mg rectally every 4 (four) hours as needed.  . Atropine Sulfate (ATROPINE-CARE OP) Place 2 drops under the tongue every 30 (thirty) minutes as needed.  . bisacodyl (DULCOLAX) 10 MG suppository Place 10 mg rectally as needed for moderate constipation.  Marland Kitchen LORazepam (ATIVAN) 2 MG/ML concentrated solution Take 0.5 mg by mouth every 4 (four) hours.  Marland Kitchen LORazepam (ATIVAN) 2 MG/ML injection Give 1 mg IM every 15 minutes as needed up to 4 doses  for seizures that last for more than 60 seconds.  . morphine (ROXANOL) 20 MG/ML concentrated solution Take 0.25 mLs (5 mg total) by mouth every hour as needed for severe pain. Give 5 mg routinely every 4 hours  . NON FORMULARY Diet Type: Mechanical soft with chopped meats  . OXYGEN Inhale 2 L/min into the  lungs as needed. To keep sats above 91%. Check O2 saturations prior to placing on patient and at least every 4 hours after applying.  Notify  MD if oxygen saturations drop below baseline while receiving oxygen or no improvement in dyspnea  . sodium phosphate (FLEET) 7-19 GM/118ML ENEM Insert 1 enema rectally for constipation not relieved by MOM or Bisacodyl suppository  . WOUND DRESSINGS EX Triad wound dressing paste - Apply thin film topically two times daily to macerated buttocks  . [DISCONTINUED] acetaminophen (TYLENOL) 325 MG tablet Take 975 mg by mouth every 8 (eight) hours as needed.   . [DISCONTINUED] LORazepam (ATIVAN) 2 MG/ML concentrated solution Take 0.3 mLs (0.6 mg total) by mouth every 4 (four) hours. (Patient not taking: Reported on 10/31/2018)   No facility-administered encounter medications on file as of 10/31/2018.      SIGNIFICANT DIAGNOSTIC EXAMS  PREVIOUS:   08-07-18: chest x-ray: mild plate like atelectasis versus pulmonary infiltrate left  lung base. Chronic lung changes and no significant change in calcified granuloma in left lung base.   09-06-18: MRI of brain:  Enlargement of dominant enhancing left parietal mass and surrounding satellite nodules with increased edema and possible nonenhancing tumor. Findings may reflect progressive tumor or acute post radiation changes.  09-27-18: KUB; nonspecific bowel gas pattern with no definite acute intrabdominal  Pathology  09-30-18: chest x-ray: no active disease seen in chest   NO NEW EXAMS.    LABS REVIEWED: PREVIOUS:   06-06-18: wbc 7.0; hgb 13.6; hc6 39.5; mcv 96; plt 146 glucose 131; bun 13; creat 0.6; k+ 4.1; na++ 140; ca 9.5  08-07-18: wbc 4.5; hgb 12.7; hct 37.2; mcv 96.9; plt 142; glucose 112; bun 21; creat 0.69; k+ 3.9; na++ 139; ca 9.4 liver normal albumin 3.4  08-28-18: glucose 100; bun 21 creat 0.65; k+ 3.4; na++ 138 ca 9.5  09-11-18: wbc 5.7; hgb 12.1; hct  37.9; mcv 101.3; plt 177 glucose 120; bun 14; creat 0.76;  k+ 3.2; na++ 143; ca 9.5 liver normal albumin 3.3  10-04-18: glucose 109; bun 13; creat 0.62; k+ 3.2; nq++ 141; ca 9.4   NO NEW LABS.   Review of Systems  Unable to perform ROS: Medical condition (not responsive )    Physical Exam Constitutional:      General: She is not in acute distress.    Appearance: She is well-developed. She is not diaphoretic.  HENT:     Head:     Comments: Alopecia  Neck:     Thyroid: No thyromegaly.  Cardiovascular:     Rate and Rhythm: Normal rate. Rhythm irregular.     Pulses: Normal pulses.     Heart sounds: Murmur present.     Comments: 2/6 Pulmonary:     Effort: Pulmonary effort is normal. No respiratory distress.     Breath sounds: Normal breath sounds.  Abdominal:     General: Bowel sounds are normal. There is no distension.     Palpations: Abdomen is soft.     Tenderness: There is no abdominal tenderness.  Musculoskeletal:     Right lower leg: No edema.     Left lower leg: No edema.  Lymphadenopathy:     Cervical: No cervical adenopathy.  Skin:    General: Skin is warm and dry.  Neurological:     Mental Status: She is alert.     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 We will attempt to wean resident as apropriate   Ok Edwards NP [facility]  Contact [phone] Monday through Friday 8am- 5pm  After hours call [phone]

## 2018-11-03 DEATH — deceased

## 2018-11-09 NOTE — Addendum Note (Signed)
Encounter addended by: Eppie Gibson, MD on: 11/09/2018 8:17 AM  Actions taken: Clinical Note Signed

## 2019-07-02 IMAGING — CT CT HEAD W/O CM
3 series · 16 of 46 positions shown, 19 images · non-contrast
Comparison: None.

CLINICAL DATA: Focal neurologic deficit, heaviness in the right
leg.

EXAM:
CT HEAD WITHOUT CONTRAST
TECHNIQUE: Contiguous axial images were obtained from the base of the skull
through the vertex without intravenous contrast.

[Series 3: head wo · axial · 0.40mm/px · z∈[+675,+795]mm · 10 of 29 slices shown, 13 images]
[im 3/29  brain]
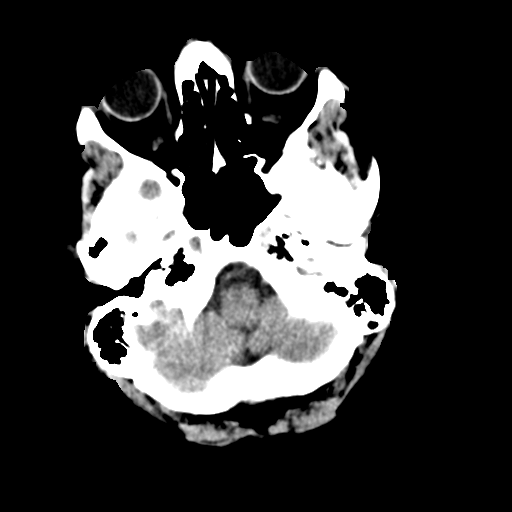
[im 3/29  bone]
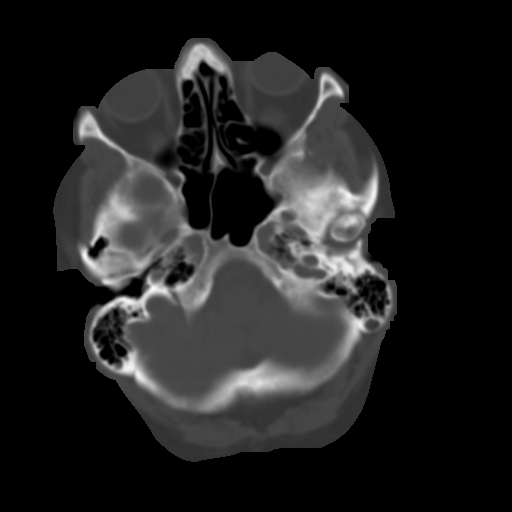
[im 6/29  brain]
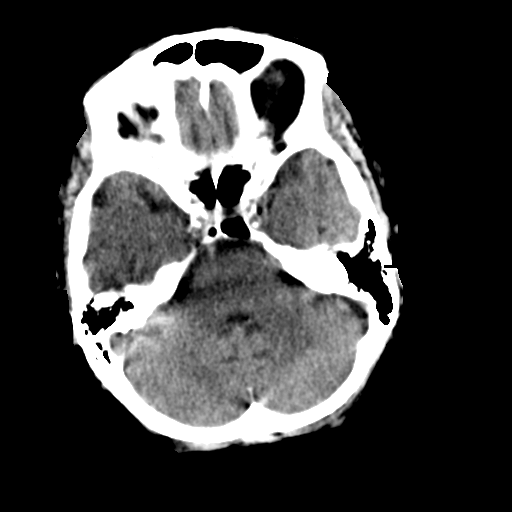
[im 8/29  brain]
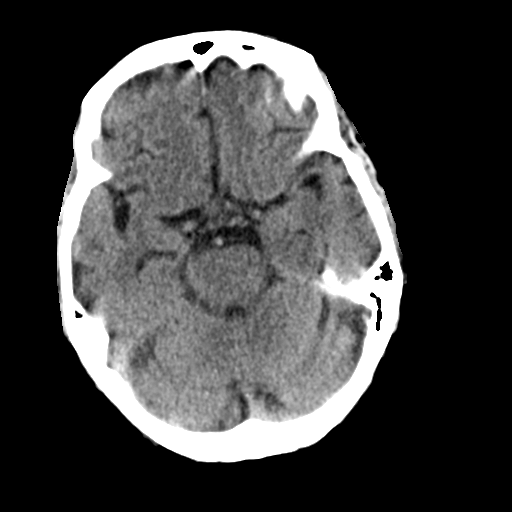
[im 11/29  brain]
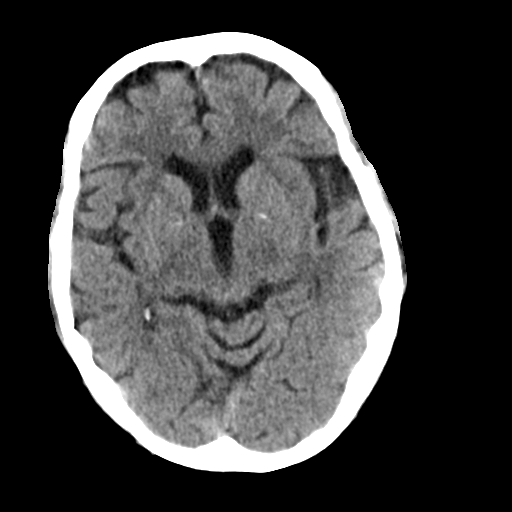
[im 14/29  brain]
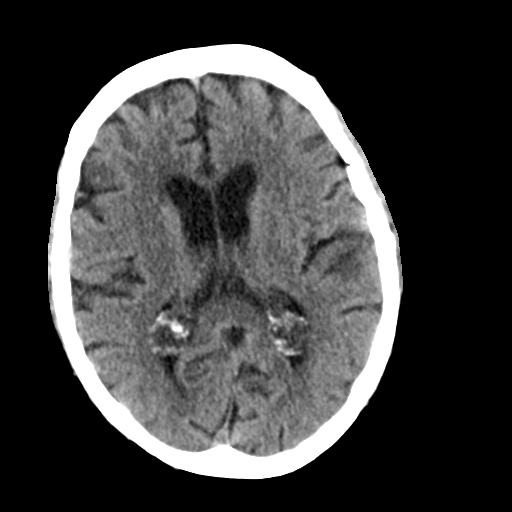
[im 14/29  bone]
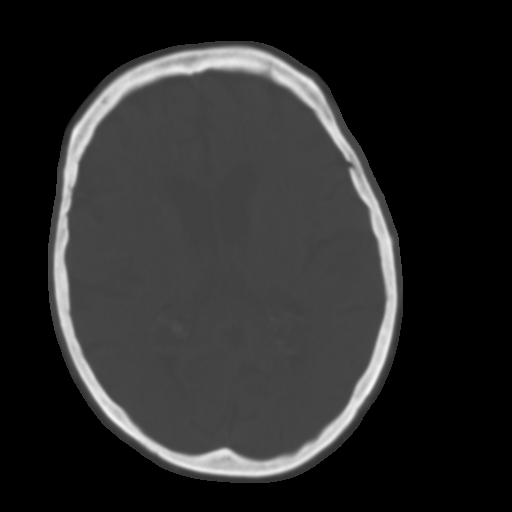
[im 16/29  brain]
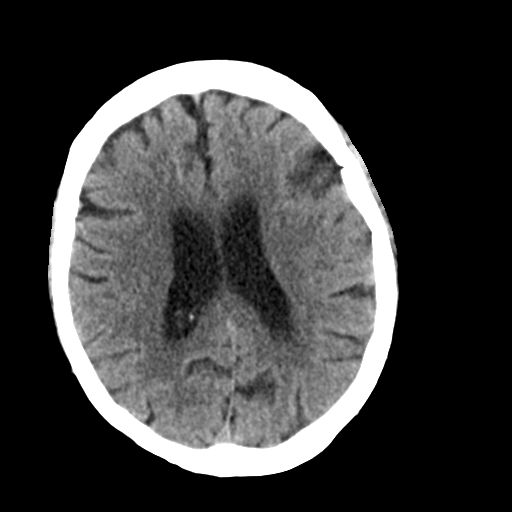
[im 19/29  brain]
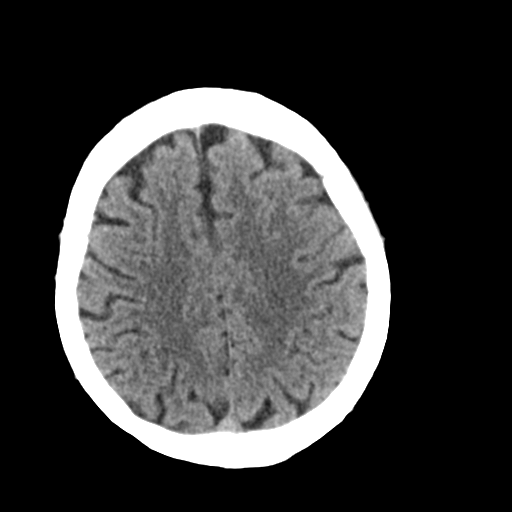
[im 22/29  brain]
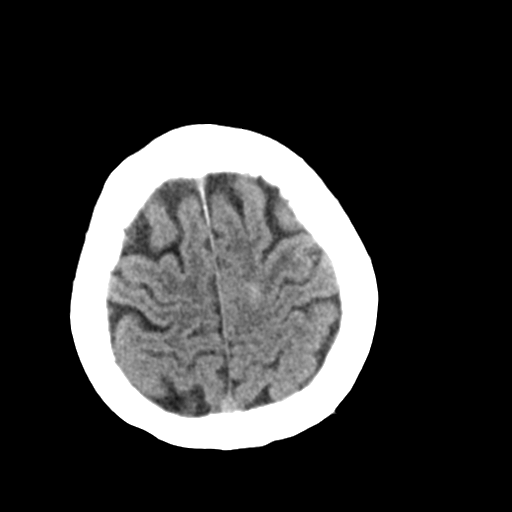
[im 24/29  brain]
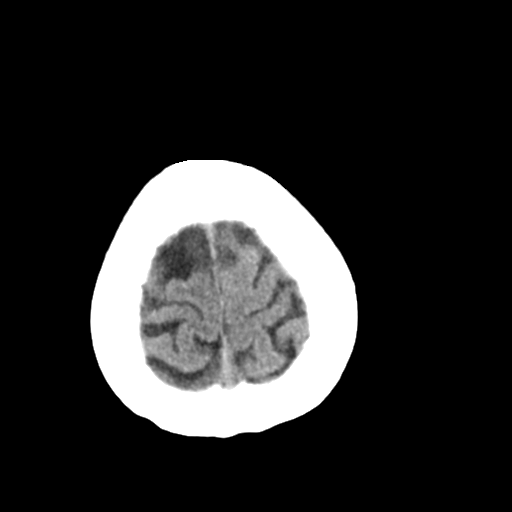
[im 24/29  bone]
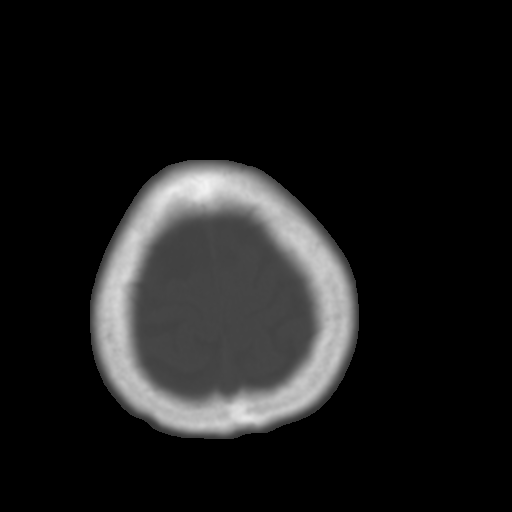
[im 27/29  brain]
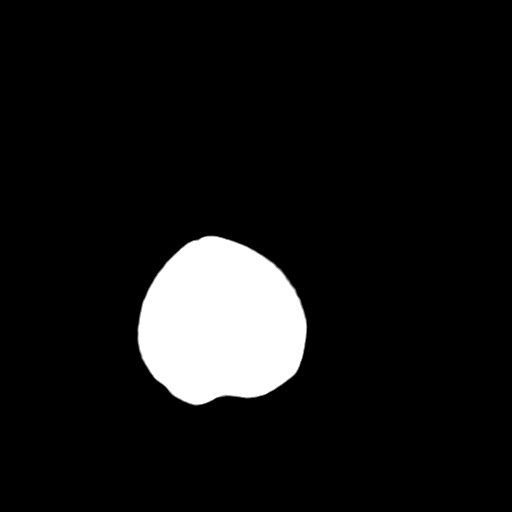

[Series 4: coronal soft tissue · coronal · 0.29mm/px · 3 of 63 slices shown]
[im 21/63  brain]
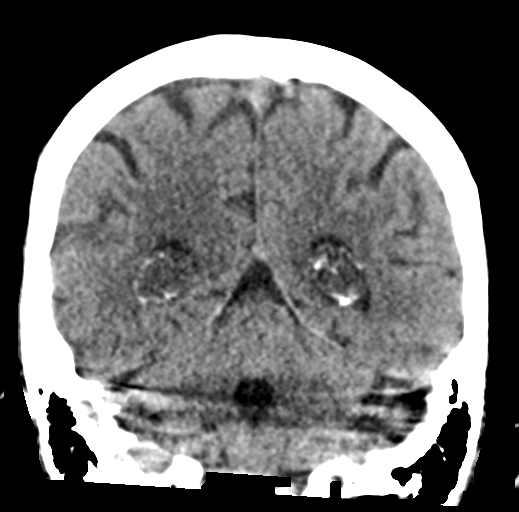
[im 28/63  brain]
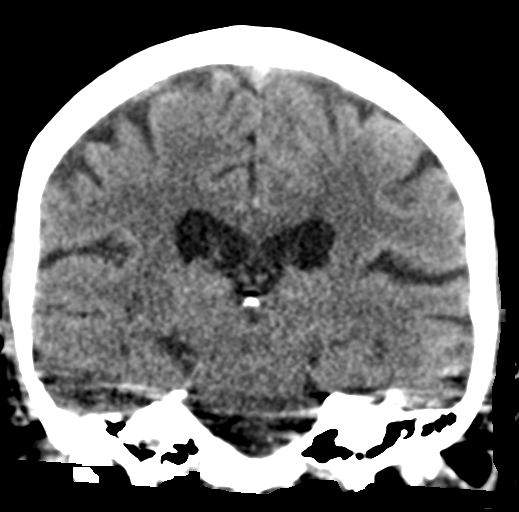
[im 35/63  brain]
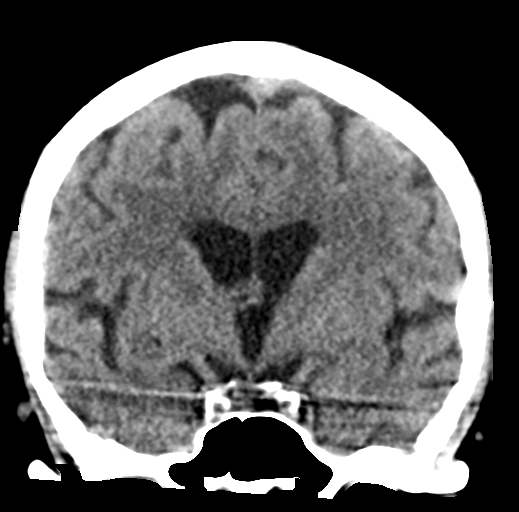

[Series 5: sagittal soft tissue · sagittal · 0.29mm/px · 3 of 51 slices shown]
[im 17/51  brain]
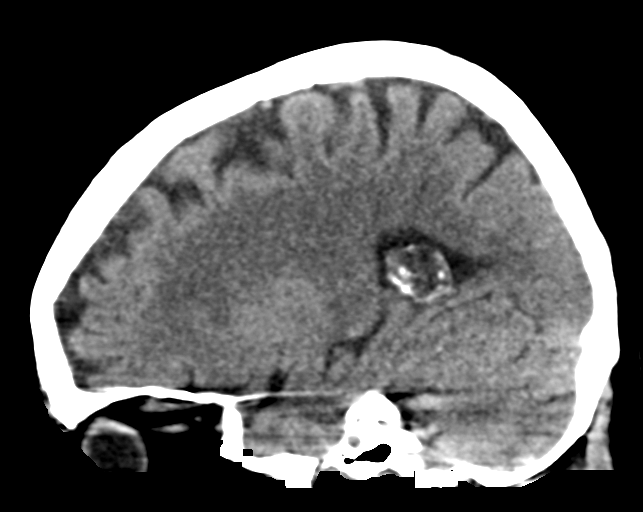
[im 26/51  brain]
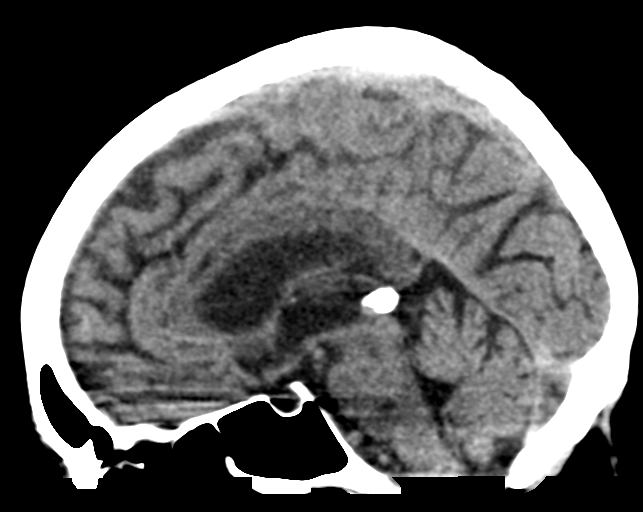
[im 34/51  brain]
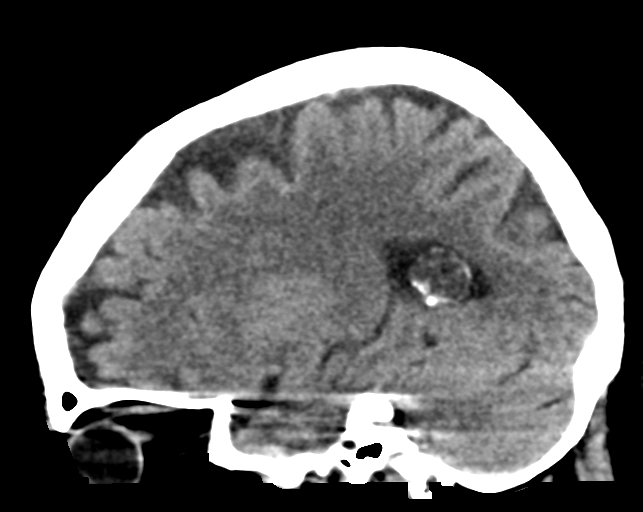

[16 of 46 positions shown; findings below may reference images not displayed]

FINDINGS: Brain: Faint calcifications in the globus pallidus nucleus, likely
physiologic. Periventricular white matter and corona radiata
hypodensities favor chronic ischemic microvascular white matter
disease.

Otherwise, the brainstem, cerebellum, cerebral peduncles, thalami,
basal ganglia, basilar cisterns, and ventricular system appear
within normal limits. No intracranial hemorrhage, mass lesion, or
acute CVA.

Vascular: There is atherosclerotic calcification of the cavernous
carotid arteries bilaterally.

Skull: Unremarkable

Sinuses/Orbits: Unremarkable

Other: No supplemental non-categorized findings.
IMPRESSION: 1. No acute intracranial findings.
2. Periventricular white matter and corona radiata hypodensities
favor chronic ischemic microvascular white matter disease.
3. There is atherosclerotic calcification of the cavernous carotid
arteries bilaterally.

## 2019-07-03 IMAGING — US US CAROTID DUPLEX BILAT
1 series · 13 of 24 positions shown · non-contrast
Comparison: None.

CLINICAL DATA: Stroke. History of hypertension hyperlipidemia.
Former smoker

EXAM:
BILATERAL CAROTID DUPLEX ULTRASOUND
TECHNIQUE: Gray scale imaging, color Doppler and duplex ultrasound were
performed of bilateral carotid and vertebral arteries in the neck.

[Series 1: us carotid duplex bilat · 13 of 72 slices shown]
[im 1/72]
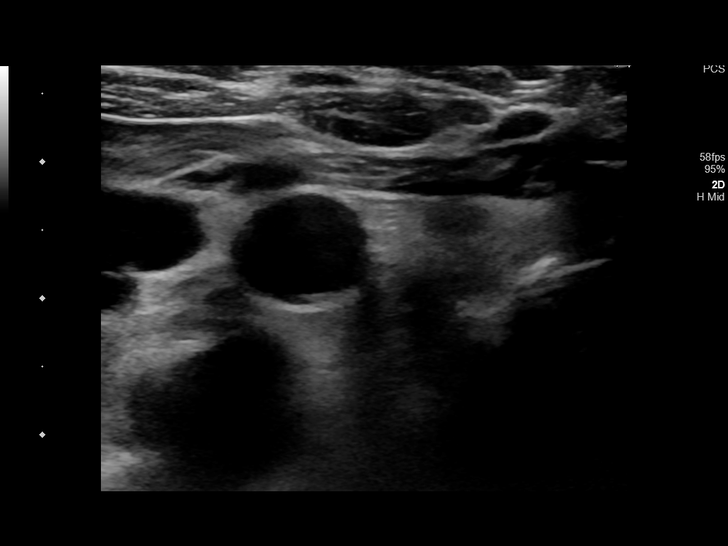
[im 7/72]
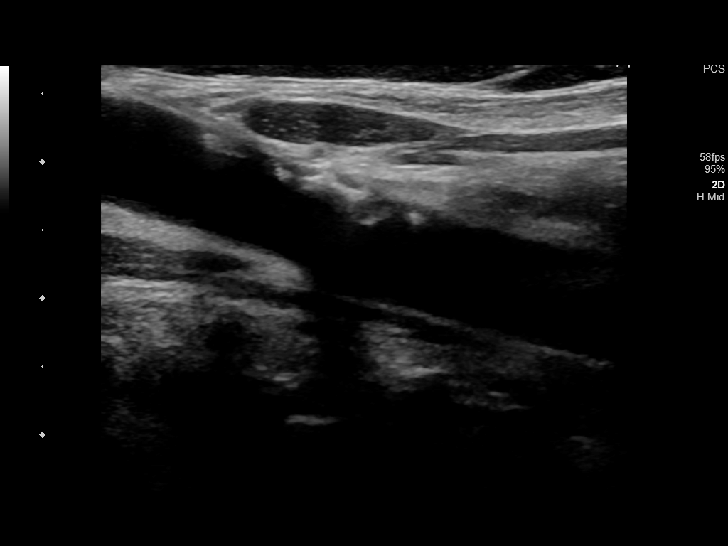
[im 13/72]
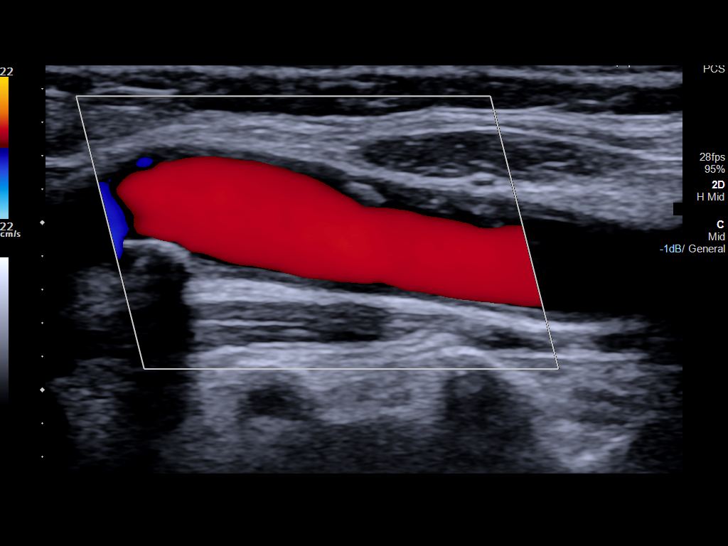
[im 19/72]
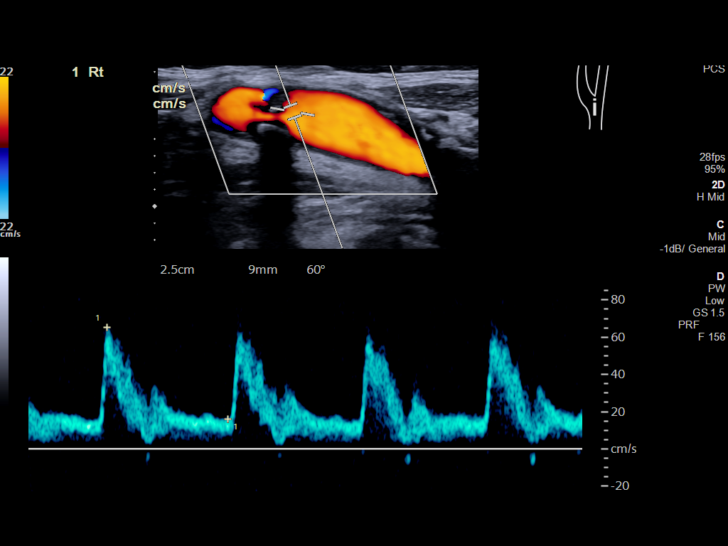
[im 25/72]
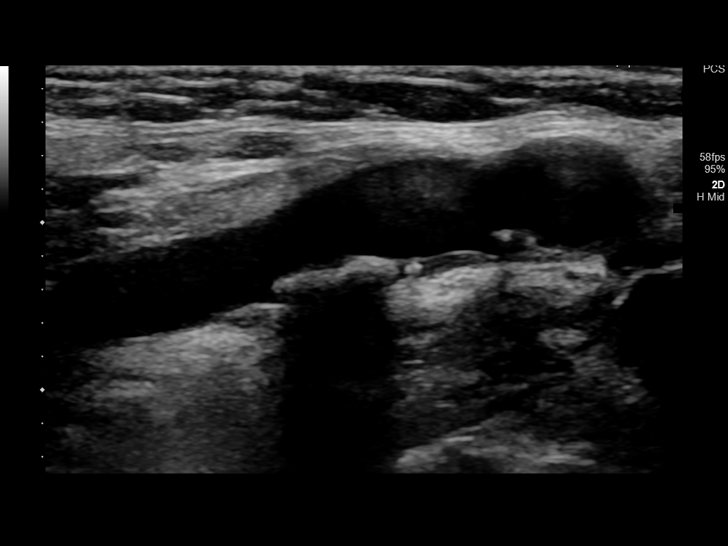
[im 31/72]
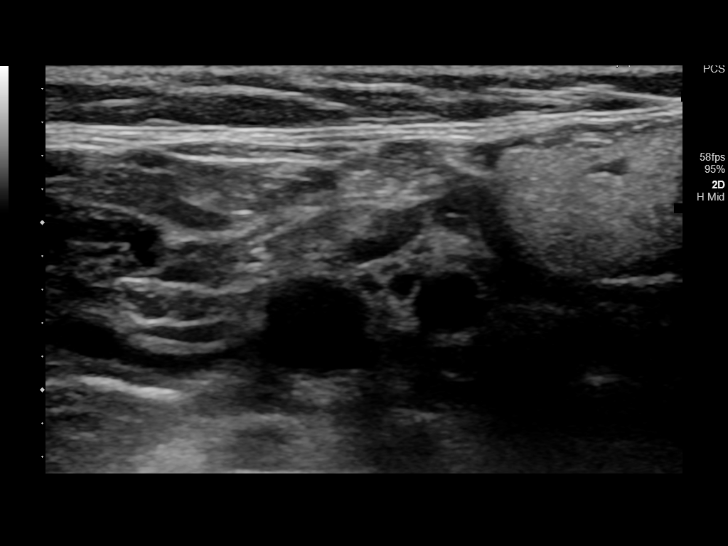
[im 38/72]
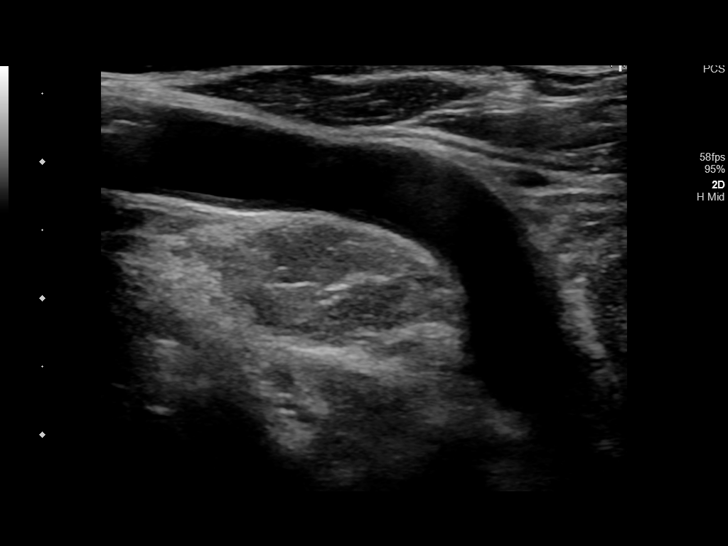
[im 41/72]
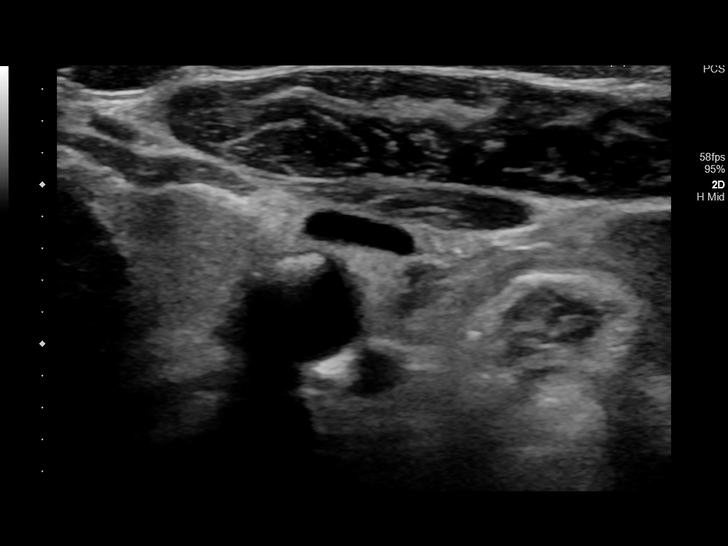
[im 47/72]
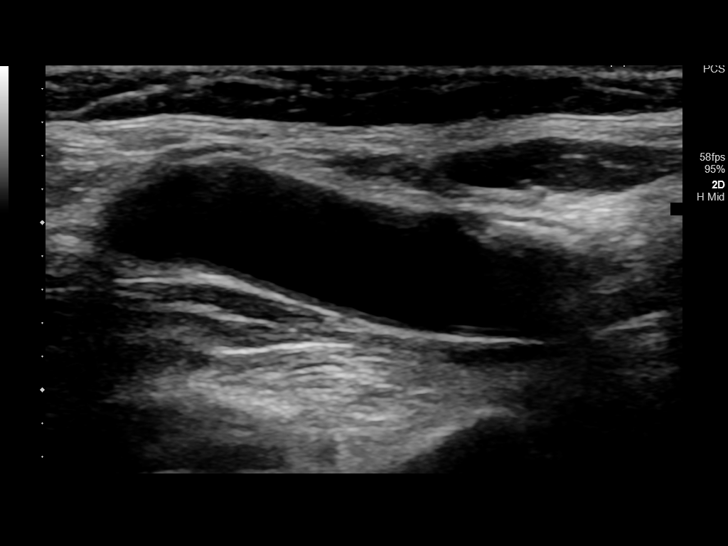
[im 53/72]
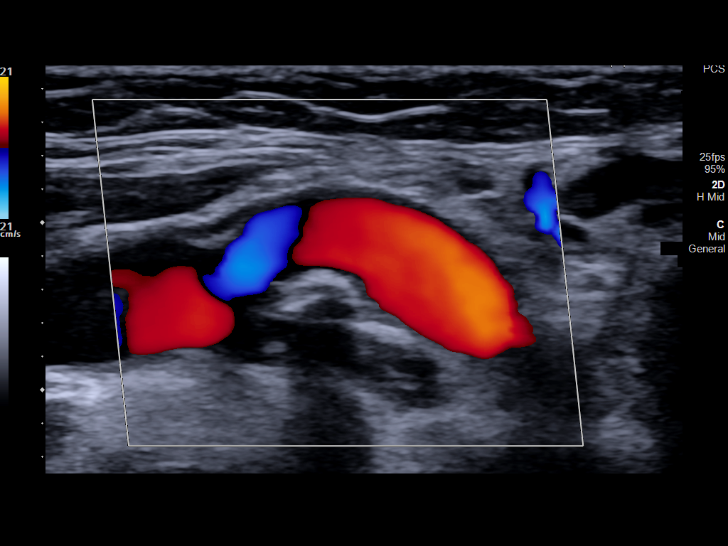
[im 59/72]
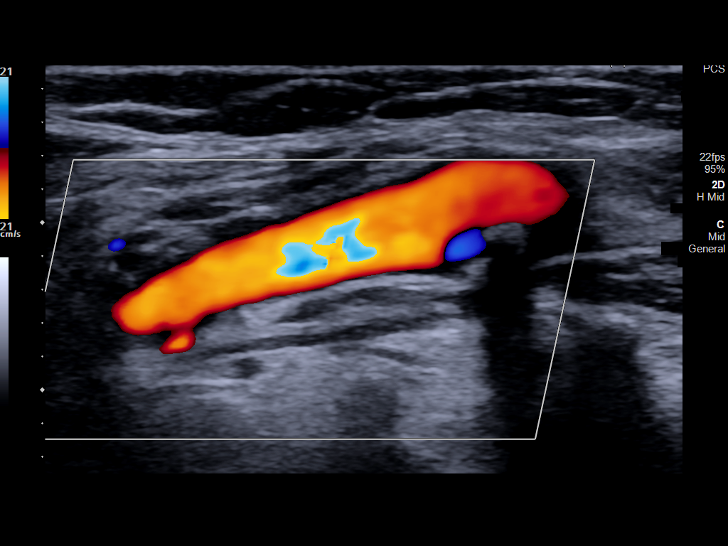
[im 65/72]
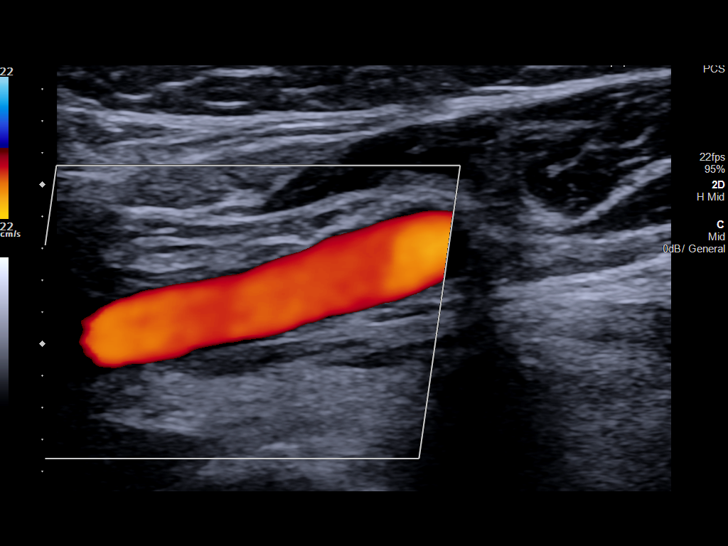
[im 72/72]
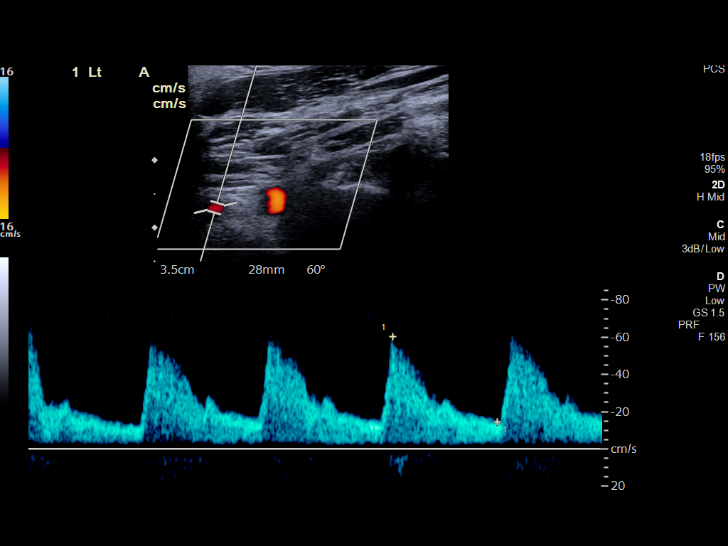

[13 of 24 positions shown; findings below may reference images not displayed]

FINDINGS: Criteria: Quantification of carotid stenosis is based on velocity
parameters that correlate the residual internal carotid diameter
with NASCET-based stenosis levels, using the diameter of the distal
internal carotid lumen as the denominator for stenosis measurement.

The following velocity measurements were obtained:

RIGHT

ICA:  71/16 cm/sec

CCA:  60/11 cm/sec

SYSTOLIC ICA/CCA RATIO:

ECA:  57 cm/sec

LEFT

ICA:  65/9 cm/sec

CCA:  62/10 cm/sec

SYSTOLIC ICA/CCA RATIO:

ECA:  50 cm/sec

RIGHT CAROTID ARTERY: There is a minimal amount mixed echogenic
plaque with the mid (image 8) and distal (image 13) aspects of the
right common carotid artery. There is a moderate amount of eccentric
echogenic plaque within the right carotid bulb (images 16 and 18),
extending to involve the origin and proximal aspects the right
internal carotid artery (image 26), not resulting in elevated peak
systolic velocities within the interrogated course of the right
internal carotid artery to suggest a hemodynamically significant
stenosis.

RIGHT VERTEBRAL ARTERY:  Antegrade flow

LEFT CAROTID ARTERY: There is a moderate amount of echogenic
shadowing plaque within the mid aspect of the left common carotid
artery (image 44). There is a moderate amount of eccentric echogenic
densely shadowing plaque within the left carotid bulb (images 51 and
53), extending to involve the origin and proximal aspects of the
left internal carotid artery (image 62), not resulting in elevated
peak systolic velocities within the interrogated course of the left
internal carotid artery to suggest a hemodynamically significant
stenosis.

LEFT VERTEBRAL ARTERY:  Antegrade flow
IMPRESSION: Moderate amount of bilateral atherosclerotic plaque, right
subjectively greater than left, not resulting in a hemodynamically
significant stenosis within either internal carotid artery.
